# Patient Record
Sex: Male | Born: 1945 | Race: White | Hispanic: No | Marital: Married
Health system: Midwestern US, Academic
[De-identification: ages and names within clinical notes are randomized; demographics above are authoritative.]

---

## 2016-07-17 MED ORDER — NITROGLYCERIN 0.4 MG SL SUBL
0.4 mg | ORAL_TABLET | SUBLINGUAL | 1 refills | 9.00000 days | Status: AC | PRN
Start: 2016-07-17 — End: ?

## 2016-07-17 MED ORDER — METOPROLOL SUCCINATE 25 MG PO TB24
25 mg | ORAL_TABLET | Freq: Every day | ORAL | 11 refills | 90.00000 days | Status: DC
Start: 2016-07-17 — End: 2017-04-15

## 2016-11-15 ENCOUNTER — Encounter: Admit: 2016-11-15 | Discharge: 2016-11-15 | Payer: MEDICARE

## 2016-11-19 ENCOUNTER — Ambulatory Visit: Admit: 2016-11-19 | Discharge: 2016-11-19 | Payer: MEDICARE

## 2016-11-19 DIAGNOSIS — Z950 Presence of cardiac pacemaker: Secondary | ICD-10-CM

## 2016-11-19 DIAGNOSIS — I495 Sick sinus syndrome: Principal | ICD-10-CM

## 2016-11-21 ENCOUNTER — Encounter: Admit: 2016-11-21 | Discharge: 2016-11-21 | Payer: MEDICARE

## 2017-02-20 ENCOUNTER — Ambulatory Visit: Admit: 2017-02-20 | Discharge: 2017-02-21 | Payer: MEDICARE

## 2017-02-21 DIAGNOSIS — Z950 Presence of cardiac pacemaker: ICD-10-CM

## 2017-02-21 DIAGNOSIS — I495 Sick sinus syndrome: Principal | ICD-10-CM

## 2017-03-12 ENCOUNTER — Encounter: Admit: 2017-03-12 | Discharge: 2017-03-12 | Payer: MEDICARE

## 2017-04-15 ENCOUNTER — Encounter: Admit: 2017-04-15 | Discharge: 2017-04-15 | Payer: MEDICARE

## 2017-04-15 MED ORDER — METOPROLOL SUCCINATE 25 MG PO TB24
25 mg | ORAL_TABLET | Freq: Every day | ORAL | 3 refills | 90.00000 days | Status: AC
Start: 2017-04-15 — End: 2018-04-01

## 2017-05-24 ENCOUNTER — Ambulatory Visit: Admit: 2017-05-24 | Discharge: 2017-05-25 | Payer: MEDICARE

## 2017-05-24 ENCOUNTER — Encounter: Admit: 2017-05-24 | Discharge: 2017-05-24 | Payer: MEDICARE

## 2017-05-25 DIAGNOSIS — I495 Sick sinus syndrome: Principal | ICD-10-CM

## 2017-05-25 DIAGNOSIS — Z950 Presence of cardiac pacemaker: ICD-10-CM

## 2017-06-27 ENCOUNTER — Encounter: Admit: 2017-06-27 | Discharge: 2017-06-27 | Payer: MEDICARE

## 2017-06-27 DIAGNOSIS — E7800 Pure hypercholesterolemia, unspecified: ICD-10-CM

## 2017-06-27 DIAGNOSIS — I10 Essential (primary) hypertension: Principal | ICD-10-CM

## 2017-06-27 DIAGNOSIS — I2510 Atherosclerotic heart disease of native coronary artery without angina pectoris: ICD-10-CM

## 2017-06-27 DIAGNOSIS — I495 Sick sinus syndrome: ICD-10-CM

## 2017-06-27 DIAGNOSIS — I4891 Unspecified atrial fibrillation: ICD-10-CM

## 2017-06-27 DIAGNOSIS — G4730 Sleep apnea, unspecified: ICD-10-CM

## 2017-08-23 ENCOUNTER — Ambulatory Visit: Admit: 2017-08-23 | Discharge: 2017-08-24 | Payer: MEDICARE

## 2017-08-23 ENCOUNTER — Encounter: Admit: 2017-08-23 | Discharge: 2017-08-23 | Payer: MEDICARE

## 2017-08-24 DIAGNOSIS — Z950 Presence of cardiac pacemaker: Principal | ICD-10-CM

## 2017-10-28 ENCOUNTER — Encounter: Admit: 2017-10-28 | Discharge: 2017-10-28 | Payer: MEDICARE

## 2017-10-28 ENCOUNTER — Ambulatory Visit: Admit: 2017-10-28 | Discharge: 2017-10-28 | Payer: MEDICARE

## 2017-10-28 DIAGNOSIS — I440 Atrioventricular block, first degree: ICD-10-CM

## 2017-10-28 DIAGNOSIS — I2510 Atherosclerotic heart disease of native coronary artery without angina pectoris: ICD-10-CM

## 2017-10-28 DIAGNOSIS — I495 Sick sinus syndrome: Principal | ICD-10-CM

## 2017-10-28 DIAGNOSIS — Z950 Presence of cardiac pacemaker: ICD-10-CM

## 2017-10-28 DIAGNOSIS — Z959 Presence of cardiac and vascular implant and graft, unspecified: ICD-10-CM

## 2017-10-28 DIAGNOSIS — I480 Paroxysmal atrial fibrillation: ICD-10-CM

## 2017-10-28 DIAGNOSIS — G4730 Sleep apnea, unspecified: ICD-10-CM

## 2017-10-28 DIAGNOSIS — I4891 Unspecified atrial fibrillation: ICD-10-CM

## 2017-10-28 DIAGNOSIS — I10 Essential (primary) hypertension: Principal | ICD-10-CM

## 2017-10-28 DIAGNOSIS — E7800 Pure hypercholesterolemia, unspecified: ICD-10-CM

## 2017-11-06 ENCOUNTER — Encounter: Admit: 2017-11-06 | Discharge: 2017-11-06 | Payer: MEDICARE

## 2017-11-12 ENCOUNTER — Ambulatory Visit: Admit: 2017-11-12 | Discharge: 2017-11-12 | Payer: MEDICARE

## 2017-11-12 DIAGNOSIS — I2510 Atherosclerotic heart disease of native coronary artery without angina pectoris: Principal | ICD-10-CM

## 2017-11-12 MED ORDER — ALBUTEROL SULFATE 90 MCG/ACTUATION IN HFAA
2 | RESPIRATORY_TRACT | 0 refills | Status: DC | PRN
Start: 2017-11-12 — End: 2017-11-17

## 2017-11-12 MED ORDER — NITROGLYCERIN 0.4 MG SL SUBL
.4 mg | SUBLINGUAL | 0 refills | Status: AC | PRN
Start: 2017-11-12 — End: ?

## 2017-11-12 MED ORDER — SODIUM CHLORIDE 0.9 % IV SOLP
250 mL | INTRAVENOUS | 0 refills | Status: AC | PRN
Start: 2017-11-12 — End: ?

## 2017-11-12 MED ORDER — REGADENOSON 0.4 MG/5 ML IV SYRG
.4 mg | Freq: Once | INTRAVENOUS | 0 refills | Status: CP
Start: 2017-11-12 — End: ?
  Administered 2017-11-12: 15:00:00 0.4 mg via INTRAVENOUS

## 2017-11-12 MED ORDER — AMINOPHYLLINE 500 MG/20 ML IV SOLN
50 mg | INTRAVENOUS | 0 refills | Status: AC | PRN
Start: 2017-11-12 — End: ?

## 2017-11-14 ENCOUNTER — Encounter: Admit: 2017-11-14 | Discharge: 2017-11-14 | Payer: MEDICARE

## 2018-01-31 ENCOUNTER — Encounter: Admit: 2018-01-31 | Discharge: 2018-01-31 | Payer: MEDICARE

## 2018-02-07 ENCOUNTER — Ambulatory Visit: Admit: 2018-02-07 | Discharge: 2018-02-08 | Payer: MEDICARE

## 2018-02-07 ENCOUNTER — Encounter: Admit: 2018-02-07 | Discharge: 2018-02-07 | Payer: MEDICARE

## 2018-02-08 DIAGNOSIS — Z950 Presence of cardiac pacemaker: Principal | ICD-10-CM

## 2018-04-01 ENCOUNTER — Encounter: Admit: 2018-04-01 | Discharge: 2018-04-01 | Payer: MEDICARE

## 2018-04-01 MED ORDER — METOPROLOL SUCCINATE 25 MG PO TB24
25 mg | ORAL_TABLET | Freq: Every day | ORAL | 3 refills | 90.00000 days | Status: AC
Start: 2018-04-01 — End: 2018-05-29

## 2018-05-06 ENCOUNTER — Ambulatory Visit: Admit: 2018-05-06 | Discharge: 2018-05-07 | Payer: MEDICARE

## 2018-05-06 ENCOUNTER — Encounter: Admit: 2018-05-06 | Discharge: 2018-05-06 | Payer: MEDICARE

## 2018-05-06 DIAGNOSIS — E8770 Fluid overload, unspecified: Principal | ICD-10-CM

## 2018-05-06 DIAGNOSIS — Z950 Presence of cardiac pacemaker: ICD-10-CM

## 2018-05-08 ENCOUNTER — Encounter: Admit: 2018-05-08 | Discharge: 2018-05-08 | Payer: MEDICARE

## 2018-05-09 ENCOUNTER — Ambulatory Visit: Admit: 2018-05-09 | Discharge: 2018-05-09 | Payer: MEDICARE

## 2018-05-09 DIAGNOSIS — I495 Sick sinus syndrome: Principal | ICD-10-CM

## 2018-05-09 DIAGNOSIS — Z950 Presence of cardiac pacemaker: ICD-10-CM

## 2018-05-09 LAB — BASIC METABOLIC PANEL
Lab: 106 — ABNORMAL LOW (ref 42.0–52.0)
Lab: 140 — ABNORMAL LOW (ref 4.70–6.10)
Lab: 16 — ABNORMAL HIGH (ref 0–14)
Lab: 190 — ABNORMAL HIGH (ref 70–105)
Lab: 2.3 — ABNORMAL HIGH (ref 0.72–1.25)
Lab: 23
Lab: 28 — ABNORMAL LOW (ref >59)
Lab: 4.7 — ABNORMAL LOW (ref 14.0–18.0)
Lab: 55 — ABNORMAL HIGH (ref 8.4–25.7)
Lab: 9.6

## 2018-05-09 LAB — CBC: Lab: 4 — ABNORMAL LOW (ref 4.8–10.8)

## 2018-05-12 ENCOUNTER — Encounter: Admit: 2018-05-12 | Discharge: 2018-05-12 | Payer: MEDICARE

## 2018-05-22 ENCOUNTER — Encounter: Admit: 2018-05-22 | Discharge: 2018-05-22 | Payer: MEDICARE

## 2018-05-29 ENCOUNTER — Encounter: Admit: 2018-05-29 | Discharge: 2018-05-29 | Payer: MEDICARE

## 2018-05-29 ENCOUNTER — Ambulatory Visit: Admit: 2018-05-29 | Discharge: 2018-05-30 | Payer: MEDICARE

## 2018-05-29 DIAGNOSIS — I2510 Atherosclerotic heart disease of native coronary artery without angina pectoris: ICD-10-CM

## 2018-05-29 DIAGNOSIS — I10 Essential (primary) hypertension: Principal | ICD-10-CM

## 2018-05-29 DIAGNOSIS — I495 Sick sinus syndrome: ICD-10-CM

## 2018-05-29 DIAGNOSIS — I4891 Unspecified atrial fibrillation: ICD-10-CM

## 2018-05-29 DIAGNOSIS — I5030 Unspecified diastolic (congestive) heart failure: ICD-10-CM

## 2018-05-29 DIAGNOSIS — E7800 Pure hypercholesterolemia, unspecified: ICD-10-CM

## 2018-05-29 DIAGNOSIS — G4730 Sleep apnea, unspecified: ICD-10-CM

## 2018-05-29 DIAGNOSIS — I480 Paroxysmal atrial fibrillation: Principal | ICD-10-CM

## 2018-05-29 MED ORDER — CARVEDILOL 12.5 MG PO TAB
12.5 mg | ORAL_TABLET | Freq: Two times a day (BID) | ORAL | 11 refills | 90.00000 days | Status: AC
Start: 2018-05-29 — End: 2018-06-25

## 2018-05-29 MED ORDER — FUROSEMIDE 40 MG PO TAB
80 mg | ORAL_TABLET | Freq: Two times a day (BID) | ORAL | 1 refills | 90.00000 days | Status: AC
Start: 2018-05-29 — End: 2018-06-25

## 2018-05-30 ENCOUNTER — Encounter: Admit: 2018-05-30 | Discharge: 2018-05-30 | Payer: MEDICARE

## 2018-06-02 ENCOUNTER — Encounter: Admit: 2018-06-02 | Discharge: 2018-06-02 | Payer: MEDICARE

## 2018-06-02 DIAGNOSIS — I10 Essential (primary) hypertension: ICD-10-CM

## 2018-06-02 DIAGNOSIS — I480 Paroxysmal atrial fibrillation: Principal | ICD-10-CM

## 2018-06-02 LAB — HEMOGLOBIN A1C: Lab: 8.2

## 2018-06-02 LAB — BASIC METABOLIC PANEL: Lab: 14 10*3/uL — ABNORMAL LOW (ref 1.8–7.0)

## 2018-06-02 LAB — MAGNESIUM: Lab: 2.2 FL — ABNORMAL LOW (ref 80–100)

## 2018-06-03 ENCOUNTER — Encounter: Admit: 2018-06-03 | Discharge: 2018-06-03 | Payer: MEDICARE

## 2018-06-03 NOTE — Telephone Encounter
-----   Message from Golda Acre, LPN sent at 9/62/9528  9:59 AM CDT -----  Regarding: RCP- seen sooner  VM from [facility] with PCP office # [phone] on triage line.  Said that he would like to move his appointment up sooner and could be seen at [place] or [place].

## 2018-06-23 ENCOUNTER — Encounter: Admit: 2018-06-23 | Discharge: 2018-06-23 | Payer: MEDICARE

## 2018-06-24 ENCOUNTER — Encounter: Admit: 2018-06-24 | Discharge: 2018-06-24 | Payer: MEDICARE

## 2018-06-25 ENCOUNTER — Encounter: Admit: 2018-06-25 | Discharge: 2018-06-25 | Payer: MEDICARE

## 2018-06-25 ENCOUNTER — Ambulatory Visit: Admit: 2018-06-25 | Discharge: 2018-06-26 | Payer: MEDICARE

## 2018-06-25 DIAGNOSIS — I10 Essential (primary) hypertension: Principal | ICD-10-CM

## 2018-06-25 DIAGNOSIS — I5030 Unspecified diastolic (congestive) heart failure: ICD-10-CM

## 2018-06-25 MED ORDER — CARVEDILOL 25 MG PO TAB
25 mg | ORAL_TABLET | Freq: Two times a day (BID) | ORAL | 3 refills | 90.00000 days | Status: DC
Start: 2018-06-25 — End: 2019-04-07

## 2018-06-25 MED ORDER — FUROSEMIDE 80 MG PO TAB
80 mg | ORAL_TABLET | Freq: Every morning | ORAL | 3 refills | 90.00000 days | Status: DC
Start: 2018-06-25 — End: 2018-08-28

## 2018-07-09 ENCOUNTER — Encounter: Admit: 2018-07-09 | Discharge: 2018-07-09 | Payer: MEDICARE

## 2018-08-05 ENCOUNTER — Encounter: Admit: 2018-08-05 | Discharge: 2018-08-05 | Payer: MEDICARE

## 2018-08-05 DIAGNOSIS — I480 Paroxysmal atrial fibrillation: ICD-10-CM

## 2018-08-05 DIAGNOSIS — I10 Essential (primary) hypertension: Principal | ICD-10-CM

## 2018-08-05 DIAGNOSIS — E782 Mixed hyperlipidemia: ICD-10-CM

## 2018-08-05 NOTE — Telephone Encounter
-----   Message from Michiel Cowboy, MD sent at 06/25/2018  3:55 PM CDT -----  1 mo ESV IF CAN GET ZOOM, though they were told they needed a credit card for zoom and clearly need zoom help

## 2018-08-06 ENCOUNTER — Encounter: Admit: 2018-08-06 | Discharge: 2018-08-06 | Payer: MEDICARE

## 2018-08-06 DIAGNOSIS — I480 Paroxysmal atrial fibrillation: ICD-10-CM

## 2018-08-06 DIAGNOSIS — I10 Essential (primary) hypertension: Principal | ICD-10-CM

## 2018-08-06 DIAGNOSIS — E782 Mixed hyperlipidemia: ICD-10-CM

## 2018-08-06 LAB — BASIC METABOLIC PANEL
Lab: 145
Lab: 2.3 — ABNORMAL HIGH
Lab: 35
Lab: 6.4 — ABNORMAL HIGH
Lab: 9.3
Lab: 114 — ABNORMAL HIGH
Lab: 121 — ABNORMAL HIGH
Lab: 16 — ABNORMAL LOW
Lab: 2.6
Lab: 71 ng/dL — ABNORMAL HIGH (ref 0.6–1.6)

## 2018-08-06 LAB — BNP (B-TYPE NATRIURETIC PEPTI): Lab: 35

## 2018-08-06 LAB — MAGNESIUM: Lab: 2.6

## 2018-08-06 NOTE — Progress Notes
Received call from [facility] lab reporting critical K 6.4.  Will await hard copy to enter and route to JST for review and recommendations.  Flag sent to CVM STJ Nurse to watch for results.

## 2018-08-07 LAB — BASIC METABOLIC PANEL
Lab: 120 — ABNORMAL HIGH (ref 98–107)
Lab: 143
Lab: 17 — ABNORMAL LOW (ref 23–31)
Lab: 5.3 — ABNORMAL HIGH (ref 3.5–5.1)
Lab: 57 — ABNORMAL HIGH (ref 8.4–25.7)

## 2018-08-08 ENCOUNTER — Encounter: Admit: 2018-08-08 | Discharge: 2018-08-08 | Payer: MEDICARE

## 2018-08-08 LAB — BASIC METABOLIC PANEL
Lab: 1.8 — ABNORMAL HIGH (ref 0.72–1.25)
Lab: 116 — ABNORMAL HIGH (ref 98–107)
Lab: 142 — ABNORMAL HIGH (ref 70–105)
Lab: 145 — ABNORMAL LOW (ref 4.70–6.10)
Lab: 19 — ABNORMAL LOW (ref 23–31)
Lab: 38 — ABNORMAL LOW (ref >59)
Lab: 4 — ABNORMAL LOW (ref 14.0–18.0)
Lab: 45 — ABNORMAL HIGH (ref 8.4–25.7)
Lab: 8.1 — ABNORMAL LOW (ref 8.8–10.0)

## 2018-08-08 LAB — CBC: Lab: 5.6

## 2018-08-11 ENCOUNTER — Encounter: Admit: 2018-08-11 | Discharge: 2018-08-11 | Payer: MEDICARE

## 2018-08-11 LAB — BASIC METABOLIC PANEL
Lab: 11
Lab: 121 — ABNORMAL HIGH (ref 70–105)
Lab: 141
Lab: 2 — ABNORMAL HIGH (ref 0.72–1.25)
Lab: 34 — ABNORMAL LOW (ref >59)
Lab: 47 — ABNORMAL HIGH (ref 8.4–25.7)
Lab: 9.2

## 2018-08-11 LAB — BNP (B-TYPE NATRIURETIC PEPTI): Lab: 29 — ABNORMAL HIGH (ref 98–107)

## 2018-08-11 LAB — MAGNESIUM: Lab: 2.3

## 2018-08-11 NOTE — Telephone Encounter
Spoke with the patients wife. No letter was mailed to the patient per chart review. Apologized. She will send over later today when her husband arrives home. Verbalized understanding. DB.     [employer]   Received: Today   Message Contents   Golda Acre, LPN  P Mac Ep Remote      ???      VM from wife [name] [phone] on triage line.   Said that they did not get letter when to send transmission.   She would like call back.

## 2018-08-11 NOTE — Telephone Encounter
Patients [facility] remote transmission was received, pending staff review. Wife was notified and verbalized understanding. Theodosia Quay, LPN  P Mac Ep Remote      ???      VM from wife [phone] on triage line.   He is trying to send transmission and it will not go through.   It is needed for OV tomorrow

## 2018-08-12 ENCOUNTER — Encounter: Admit: 2018-08-12 | Discharge: 2018-08-12 | Payer: MEDICARE

## 2018-08-12 ENCOUNTER — Ambulatory Visit: Admit: 2018-08-12 | Discharge: 2018-08-12 | Payer: MEDICARE

## 2018-08-12 ENCOUNTER — Ambulatory Visit: Admit: 2018-08-12 | Discharge: 2018-08-13 | Payer: MEDICARE

## 2018-08-12 DIAGNOSIS — I10 Essential (primary) hypertension: Principal | ICD-10-CM

## 2018-08-12 DIAGNOSIS — I2510 Atherosclerotic heart disease of native coronary artery without angina pectoris: ICD-10-CM

## 2018-08-12 DIAGNOSIS — G4730 Sleep apnea, unspecified: ICD-10-CM

## 2018-08-12 DIAGNOSIS — I495 Sick sinus syndrome: ICD-10-CM

## 2018-08-12 DIAGNOSIS — I480 Paroxysmal atrial fibrillation: ICD-10-CM

## 2018-08-12 DIAGNOSIS — I4891 Unspecified atrial fibrillation: ICD-10-CM

## 2018-08-12 DIAGNOSIS — E7800 Pure hypercholesterolemia, unspecified: ICD-10-CM

## 2018-08-12 MED ORDER — CHLORTHALIDONE 25 MG PO TAB
25 mg | ORAL_TABLET | Freq: Every day | ORAL | 3 refills | Status: DC
Start: 2018-08-12 — End: 2019-05-21

## 2018-08-12 NOTE — Progress Notes
Obtained patient's verbal consent to treat them and their agreement to [facility] financial policy and NPP via this telehealth visit during the [facility]    Date of Service: 08/12/2018    Paul Benjamin is a 73 y.o. male.       HPI     This telephone visit was conducted via a call between the patient and physician/provider due to COVID-19. . We have found that certain health care needs can be provided without the need for a physical exam.. This service lets [place] provide the care patients' need.  If a prescription is necessary we can send it directly to their pharmacy.  If testing is necessary this can be arranged.    Paul Benjamin is a very pleasant 73 y.o. man followed in our [place] clinic for HFpEF, and he has seen Dr. Bradly Bienenstock for atrial fib and pacemaker dependence.  He has comorbid CKD3, HTN, venous insufficiency/lymphedema, and type 2 diabetes.    We sent him to [facility] 5/13 for assessment of hyperkalemia with potassium 6.4.  Notably he is on lisinopril and insulin, and he was asymptomatic at the time.  He was admitted for observation following treatment with iv calcium, lasix, and Kayexalate. 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    Today he reports stable dyspnea on exertion after half a block or so.   He currently denies exertional chest pain, irregular heartbeat/palpitations, dizzy spells, syncope/near syncope, edema, orthopnea, and paroxysmal nocturnal dyspnea.  Home blood pressure ranges 160/80, and pulse remains around 70-80.  Weight on home scale has been down a touch 260-265 pounds.           Vitals:    08/12/18 0947   Weight: 117.9 kg (260 lb)   Height: 1.803 m (5' 11)   PainSc: Zero     Body mass index is 36.26 kg/m???.     Past Medical History  Patient Active Problem List    Diagnosis Date Noted   ??? Preoperative clearance 06/26/2013 ??? 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 PLMD (periodic limb movement disorder) 09/29/2008   ??? 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 SSS (sick sinus syndrome) (HCC)      09/30/06 Dual Chamber PPM Initial ImplantMedtronic generator model #ADDR01 Medtronic atrial lead model #4076, Medtronic RV lead model #4076,      ??? A-fib (HCC) 09/30/2006     Brief episode of atrial fibrillation after the stenting.         a.  Recurrent bouts of atrial fibrillation but completely asymptomatic.  3/09:  Hospital admission for sotalol initiation and cardioversion  09/23/07 A fib ablation:  Pulmonary vein antral isolation, ligament of marshall ablation, posterior roof line, cavotricuspid isthmus ablation  6/09 event monitor:  No recurrent AF     ??? 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  Implanted device is a MDT Adapta [id number]     ??? CAD (coronary artery disease) 09/30/2006     01/21/04 Heart catheterization - Angioplasty and stenting of the anterior descending coronary artery on 01/21/04 with a Cypher drug-eluting stent used for the LAD. The right coronary and circumflex showed only minimal disease. The ejection fraction was 45-50%.   07/18/2004:  Thallium showing EF of 53% with minimal amount of ischemia involving the  inferolateral segment.  3/09 adenosine thallium stress:  This study suggests a low likelihood for ischemic/jeopardized myocardium. No definite fixed or reversible defects are evident. High risk scintigraphic features are absent. Although the calculated LV ejection fraction was reduced, by visual estimation, the EF would appear at the lower limits of normal. The left ventricle is within normal limits for size.     7/11 thallium:  SUMMARY/OPINION: This study is probably normal. There is a small defect involving the inferior wall. The majority of the defect is fixed while more distally there is a subtle degree of reversibility. Neurological: Negative.    Psychiatric/Behavioral: Negative.    Allergic/Immunologic: Negative.        Physical Exam  Not done, phone visit    Cardiovascular Studies  EKG performed at [facility] dated Aug 06, 2018 was a paced at a rate of 70 with a PR interval of 264 ms.  There is a first-degree AV block and left axis deviation with QRS -30.  Otherwise there are no concerning EKG changes.  His chest x-ray also performed at [facility] on the same day showed a mild left pleural effusion with adjacent atelectasis.  Multiple old left sided rib fractures were appreciated.    Remote transmission from his pacemaker was sent last night: No atrial or ventricular arrhythmias.  He is a paced V sensed at 70 bpm with a battery life estimated to be 7 years.  We will have next scheduled device check in August.    Problems Addressed Today  Encounter Diagnoses   Name Primary?   ??? Coronary artery disease involving native coronary artery of native heart without angina pectoris Yes   ??? Essential hypertension    ??? Paroxysmal atrial fibrillation (HCC)        Assessment and Plan     He has chronic heart failure with preserved ejection fraction, stable NYHA functional class III symptoms.  His blood pressure is slightly elevated since discontinuing lisinopril.  We will add chlorthalidone to his medication list and move lisinopril to his allergy list due to adverse effect of hyperkalemia with no other readily apparent cause.  He will follow with endocrinology and his primary care provider to see if he needs additional adjustments for this.  In the meantime we will continue his diuretic at current dose and have him follow in our clinic in the next 3 to 4 months for heart failure reassessment.  From an A. fib standpoint he remains in sinus rhythm and is no longer anticoagulated.  We will continue to have his device monitored by the device clinic and follow with Dr. Bradly Bienenstock periodically. Thank you for allowing me to participate in the care of this patient.  Please do not hesitate to contact me should you have any questions or concerns.    Baldo Ash, MD, PhD  Advanced Heart Failure and Transplant Cardiology  Pager [phone]       I spent 15 minutes with the patient today on the phone including approximately 10 minutes in counseling on his heart disease.  We reviewed the above treatment plan, went over risks/benefits/alternatives to the therapies, and all questions were answered to his satisfaction.      Current Medications (including today's revisions)  ??? allopurinol (ZYLOPRIM) 100 mg tablet Take 100 mg by mouth Twice Daily.   ??? amLODIPine (NORVASC) 10 mg tablet Take 10 mg by mouth daily.   ??? armodafinil(+) (NUVIGIL) 250 mg tab tablet Take 250 mg by mouth daily.   ??? ascorbic  acid (VITAMIN C) 1,000 mg tablet Take  by mouth.   ??? carvediloL (COREG) 25 mg tablet Take one tablet by mouth twice daily. Take with food.   ??? chlorthalidone (HYGROTON) 25 mg tablet Take one tablet by mouth daily.   ??? cholecalciferol (VITAMIN D-3) 1,000 units tablet Take 2,000 Units by mouth daily.   ??? furosemide (LASIX) 80 mg tablet Take one tablet by mouth every morning. OK TO TAKE EXTRA DOSE IN AFTERNOON IF WEIGHT ABOVE 272 POUNDS  Indications: visible water retention   ??? glimepiride (AMARYL) 4 mg tablet Take 4 mg by mouth every morning.   ??? insulin aspart U-100 (NOVOLOG FLEXPEN) 100 unit/mL (3 mL) injection PEN Inject 8 Units under the skin daily. As needed   ??? LEVEMIR FLEXTOUCH U-100 INSULN 100 unit/mL (3 mL) injection pen Inject 30 Units under the skin twice daily.   ??? levothyroxine (SYNTHROID) 75 mcg tablet Take 75 mcg by mouth daily.   ??? magnesium oxide (MAG-OX) 400 mg tablet Take 400 mg by mouth Twice Daily.   ??? nitroglycerin (NITROSTAT) 0.4 mg tablet Place 1 tablet under tongue every 5 minutes as needed for Chest Pain.   ??? pramipexole (MIRAPEX) 1.5 mg PO Tab Take 1.5 mg by mouth at bedtime daily.

## 2018-08-13 DIAGNOSIS — I495 Sick sinus syndrome: Principal | ICD-10-CM

## 2018-08-13 DIAGNOSIS — Z950 Presence of cardiac pacemaker: Secondary | ICD-10-CM

## 2018-08-15 ENCOUNTER — Encounter: Admit: 2018-08-15 | Discharge: 2018-08-15 | Payer: MEDICARE

## 2018-08-15 NOTE — Telephone Encounter
-----   Message from Gordy Savers sent at 08/12/2018 10:31 AM CDT -----  Regarding: RE: Remote sent  We received a transmission yesterday. Normal device function. No events.   ----- Message -----  From: Weston Brass  Sent: 08/12/2018  10:24 AM CDT  To: Phineas Real Remote  Subject: Remote sent                                      Patient had recent high k was sent to hospital. He is worried about arrhythmia sent remote.  Please advise if any issues     Thanks  Brett Canales

## 2018-08-25 ENCOUNTER — Encounter: Admit: 2018-08-25 | Discharge: 2018-08-25

## 2018-08-26 ENCOUNTER — Encounter: Admit: 2018-08-26 | Discharge: 2018-08-26

## 2018-08-26 DIAGNOSIS — I10 Essential (primary) hypertension: Secondary | ICD-10-CM

## 2018-08-26 DIAGNOSIS — I480 Paroxysmal atrial fibrillation: Secondary | ICD-10-CM

## 2018-08-26 DIAGNOSIS — I2510 Atherosclerotic heart disease of native coronary artery without angina pectoris: Secondary | ICD-10-CM

## 2018-08-26 LAB — BASIC METABOLIC PANEL
Lab: 11
Lab: 117 — ABNORMAL HIGH (ref 70–105)
Lab: 143
Lab: 2.6 — ABNORMAL HIGH (ref 0.72–1.25)
Lab: 24 — ABNORMAL LOW (ref >59)
Lab: 8.9

## 2018-08-26 LAB — MAGNESIUM: Lab: 2.6 % — ABNORMAL LOW (ref 28–42)

## 2018-08-26 NOTE — Telephone Encounter
-----   Message from Jamison Neighbor, MD sent at 08/26/2018  3:37 PM CDT -----  Regarding: labs  Paul Benjamin, I will have the nurses up [facility] call.    ATC crew, can we get a weight and symptom check and make sure he can see me on one of my June days in ATC, like 6/25.  I know the K was off, but I do not recall the med changes made after the ER visit.  Please let me know then I can help decide how to manage the renal function.  Thanks  ----- Message -----  From: Mendel Ryder, RN  Sent: 08/26/2018   1:55 PM CDT  To: Jamison Neighbor, MD    Labs ordered at [place] on 08/12/18.  Creat 2.68 up from 2.01 two weeks ago,BUN 61,  Mag 2.6    Notes from AVS 08-12-18  Med changes today:  Stop lisinopril, start chlorthalidone 25 mg daily    Med List  Lasix 80 mg daily, extra dose if weight> 275  Mag 400mg  BID    Any changes?    Paul Benjamin

## 2018-08-26 NOTE — Telephone Encounter
Hold lasix x 2 days then take every other day called to wife

## 2018-08-26 NOTE — Telephone Encounter
Lab orders faxed to [facility].

## 2018-08-26 NOTE — Telephone Encounter
-----   Message from Jamison Neighbor, MD sent at 08/26/2018  3:37 PM CDT -----  Regarding: labs  Joy, I will have the nurses up [facility] call.    ATC crew, can we get a weight and symptom check and make sure he can see me on one of my June days in ATC, like 6/25.  I know the K was off, but I do not recall the med changes made after the ER visit.  Please let me know then I can help decide how to manage the renal function.  Thanks  ----- Message -----  From: Mendel Ryder, RN  Sent: 08/26/2018   1:55 PM CDT  To: Jamison Neighbor, MD    Labs ordered at [place] on 08/12/18.  Creat 2.68 up from 2.01 two weeks ago,BUN 61,  Mag 2.6    Notes from AVS 08-12-18  Med changes today:  Stop lisinopril, start chlorthalidone 25 mg daily    Med List  Lasix 80 mg daily, extra dose if weight> 275  Mag 400mg  BID    Any changes?    Joy

## 2018-08-26 NOTE — Telephone Encounter
-----   Message from Jamison Neighbor, MD sent at 08/26/2018  3:37 PM CDT -----  Regarding: labs  Joy, I will have the nurses up [facility] call.    ATC crew, can we get a weight and symptom check and make sure he can see me on one of my June days in ATC, like 6/25.  I know the K was off, but I do not recall the med changes made after the ER visit.  Please let me know then I can help decide how to manage the renal function.  Thanks  ----- Message -----  From: Mendel Ryder, RN  Sent: 08/26/2018   1:55 PM CDT  To: Jamison Neighbor, MD    Labs ordered at [place] on 08/12/18.  Creat 2.68 up from 2.01 two weeks ago,BUN 61,  Mag 2.6    Notes from AVS 08-12-18  Med changes today:  Stop lisinopril, start chlorthalidone 25 mg daily    Med List  Lasix 80 mg daily, extra dose if weight> 275  Mag 400mg  BID    Any changes?    Joy

## 2018-08-28 ENCOUNTER — Encounter: Admit: 2018-08-28 | Discharge: 2018-08-28

## 2018-08-28 ENCOUNTER — Ambulatory Visit: Admit: 2018-08-28 | Discharge: 2018-08-29

## 2018-08-28 DIAGNOSIS — G4730 Sleep apnea, unspecified: Secondary | ICD-10-CM

## 2018-08-28 DIAGNOSIS — E7800 Pure hypercholesterolemia, unspecified: Secondary | ICD-10-CM

## 2018-08-28 DIAGNOSIS — I480 Paroxysmal atrial fibrillation: Secondary | ICD-10-CM

## 2018-08-28 DIAGNOSIS — I10 Essential (primary) hypertension: Secondary | ICD-10-CM

## 2018-08-28 DIAGNOSIS — I2510 Atherosclerotic heart disease of native coronary artery without angina pectoris: Secondary | ICD-10-CM

## 2018-08-28 DIAGNOSIS — I495 Sick sinus syndrome: Secondary | ICD-10-CM

## 2018-08-28 DIAGNOSIS — I4891 Unspecified atrial fibrillation: Secondary | ICD-10-CM

## 2018-08-28 DIAGNOSIS — I5032 Chronic diastolic (congestive) heart failure: Secondary | ICD-10-CM

## 2018-08-28 MED ORDER — FUROSEMIDE 80 MG PO TAB
80 mg | ORAL_TABLET | ORAL | 3 refills | 90.00000 days | Status: AC
Start: 2018-08-28 — End: ?

## 2018-08-28 NOTE — Progress Notes
Date of Service: 08/28/2018    Paul Benjamin is a 73 y.o. male.       HPI       [name] is a very pleasant 73 y.o. man with a history of bicuspid aortic valve and chronic diastolic heart failure.  I saw him 1 month ago in [place], and we increased his Lasix to 80 mg twice daily for approximately 10 pounds volume overload.  He subsequently achieved a dry weight of approximately 267 pounds, and is now only taking the Lasix once a day as instructed.  He continues to have dyspnea exertion with just about every activity, but he is otherwise without cardiac complaints.     Today he reports stable dyspnea after walking 5 minutes on exertion and worsening edema.  He currently denies exertional chest pain, irregular heartbeat/palpitations, dizzy spells, syncope/near syncope, orthopnea, and paroxysmal nocturnal dyspnea.  Home blood pressure is not recorded.  Pulse is usually in the 60-70s and he recalls SBP usually 110-130.  Weight on home scale has been 256 with no clothes on; on our scale he is 20 pounds down since March. He has easy bruising but no bleeding.  He has a good urine ressponse to lasix but inconsistent, sometimes all at once, sometimes multiple trips to urinate.          Vitals:    08/28/18 1042 08/28/18 1051   BP: 118/68 122/70   BP Source: Arm, Right Upper Arm, Left Upper   Pulse: 73    SpO2: 97%    Weight: 119.7 kg (264 lb)    Height: 1.803 m (5' 11)    PainSc: Zero      Body mass index is 36.82 kg/m???.     Past Medical History  Patient Active Problem List    Diagnosis Date Noted   ??? Preoperative clearance 06/26/2013   ??? 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 PLMD (periodic limb movement disorder) 09/29/2008   ??? 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 SSS (sick sinus syndrome) (HCC)      09/30/06 Dual Chamber PPM Initial ImplantMedtronic generator model #ADDR01 Medtronic atrial lead model #4076, Medtronic RV lead model #4076,      ??? A-fib (HCC) 09/30/2006 Brief episode of atrial fibrillation after the stenting.         a.  Recurrent bouts of atrial fibrillation but completely asymptomatic.  3/09:  Hospital admission for sotalol initiation and cardioversion  09/23/07 A fib ablation:  Pulmonary vein antral isolation, ligament of marshall ablation, posterior roof line, cavotricuspid isthmus ablation  6/09 event monitor:  No recurrent AF     ??? 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  Implanted device is a MDT Adapta ADDR01     ??? CAD (coronary artery disease) 09/30/2006     01/21/04 Heart catheterization - Angioplasty and stenting of the anterior descending coronary artery on 01/21/04 with a Cypher drug-eluting stent used for the LAD. The right coronary and circumflex showed only minimal disease. The ejection fraction was 45-50%.   07/18/2004:  Thallium showing EF of 53% with minimal amount of ischemia involving the               inferolateral segment.  3/09 adenosine thallium stress:  This study suggests a low likelihood for ischemic/jeopardized myocardium. No definite fixed or reversible defects are evident. High risk scintigraphic features are absent. Although the calculated LV ejection fraction was reduced, by visual  estimation, the EF would appear at the lower limits of normal. The left ventricle is within normal limits for size.     7/11 thallium:  SUMMARY/OPINION: This study is probably normal. There is a small defect involving the inferior wall. The majority of the defect is fixed while more distally there is a subtle degree of reversibility. Overall I think this probably represents diaphragmatic attenuation though a very slight degree of ischemia cannot be completely excluded. The left ventricular systolic function is normal with a calculated ejection fraction of 61 percent. The end diastolic volume is normal at 100 mL. The pulmonary to myocardial count ratio is normal at 0.36. There is no transient ischemic dilatation. This study was compared to a myocardial perfusion study obtained in our office on May 26, 2007. The prior study demonstrated an EF of 43 percent though visually it was felt to be in more of the 50-55 percent range. The prior study also demonstrated a perfusion abnormality involving the inferior wall which had some mild reversibility but overall was felt to be due to diaphragmatic attenuation.   In comparing the two studies qualitatively, both demonstrated an inferior wall defect. The prior study had more reversibility involving the basal to mid section while the current study has some mild reversibility involving the distal section. Overall I think both studies probably represent diaphragmatic attenuation.     05/03/2015 - Stress Test:  ??? This study is probably normal with inferior attenuation with a small sized mild intensity reversible defect in the basal inferior wall consistent with??? diaphragmatic attenuation. Left ventricular systolic function is normal. There are no high risk prognostic indicators present.??? The ECG portion of the study is negative for ischemia.         ??? Hypertension 09/30/2006   ??? 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 Sleep apnea 09/30/2006         Review of Systems   Constitution: Negative.   HENT: Negative.    Eyes: Negative.    Cardiovascular: Positive for leg swelling.   Respiratory: Negative.    Endocrine: Negative.    Hematologic/Lymphatic: Negative.    Skin: Negative.    Musculoskeletal: Positive for back pain.   Gastrointestinal: Positive for constipation and diarrhea.   Genitourinary: Positive for frequency.   Neurological: Negative.    Psychiatric/Behavioral: Negative.    Allergic/Immunologic: Negative.        Physical Exam  Constitutional: He appears well-developed and well-nourished.   HENT:  Head: Normocephalic.   Mouth/Throat: Oropharynx is clear and moist.   Eyes: Conjunctivae are normal.   Neck: Normal range of motion. No JVD present. Normal carotid pulses. Cardiovascular: Normal rate, regular rhythm, normal heart sounds and intact distal pulses. Exam reveals no gallop and no friction rub.  No murmur heard.  Pulmonary/Chest: Effort normal and breath sounds normal. No respiratory distress. He has no wheezes. He has no rales. He exhibits no chest wall tenderness.   Abdominal: Soft. Bowel sounds are normal. He exhibits no distension. There is no tenderness.   Musculoskeletal: Normal range of motion and muscular tone. He exhibits 1+ edema with no tenderness.   Neurological: He is alert and oriented to person, place, and time. No focal deficits.  Skin: Skin is warm. No erythema.   Psychiatric: He has a normal mood and affect. Judgment, behavior, and thought content normal.       Cardiovascular Studies      Problems Addressed Today  Encounter Diagnoses   Name Primary?   ??? Essential hypertension Yes   ???  Paroxysmal atrial fibrillation (HCC)    ??? Coronary artery disease involving native coronary artery of native heart without angina pectoris    ??? Chronic heart failure with preserved ejection fraction (HFpEF) (HCC)        Assessment and Plan     Today he is warm, normotensive, euvolemic to perhaps a very mildly volume overloaded with stable NYHA functional class symptoms.  I will use this opportunity to increase his carvedilol to 25 mg twice daily and continue Lasix 80 mg daily.  We will watch his blood pressure and rate trend along with his weights closely and recheck labs in a month.  He like to coordinate his blood work in conjunction with PCP Dr. Herschell Dimes later this month with labs for that appointment.  I am fine with that.  He has otherwise stable coronary disease and is appropriately on nitroglycerin and statin therapy with no angina.    Thank you for allowing me to participate in the care of this patient.  Please do not hesitate to contact me should you have any questions or concerns.    Baldo Ash, MD, PhD  Advanced Heart Failure and Transplant Cardiology Pager [phone]       I spent 25 minutes with the patient today including approximately 15 minutes in counseling on his heart disease.  We reviewed the above treatment plan, went over risks/benefits/alternatives to the therapies, and all questions were answered to his satisfaction.      Current Medications (including today's revisions)  ??? allopurinol (ZYLOPRIM) 100 mg tablet Take 100 mg by mouth Twice Daily.   ??? amLODIPine (NORVASC) 10 mg tablet Take 10 mg by mouth daily.   ??? armodafinil(+) (NUVIGIL) 250 mg tab tablet Take 250 mg by mouth daily.   ??? ascorbic acid (VITAMIN C) 1,000 mg tablet Take  by mouth.   ??? carvediloL (COREG) 25 mg tablet Take one tablet by mouth twice daily. Take with food.   ??? chlorthalidone (HYGROTON) 25 mg tablet Take one tablet by mouth daily.   ??? cholecalciferol (VITAMIN D-3) 1,000 units tablet Take 2,000 Units by mouth daily.   ??? furosemide (LASIX) 80 mg tablet Take one tablet by mouth every morning. OK TO TAKE EXTRA DOSE IN AFTERNOON IF WEIGHT ABOVE 272 POUNDS  Indications: visible water retention (Patient taking differently: Take 80 mg by mouth every 48 hours. OK TO TAKE EXTRA DOSE IN AFTERNOON IF WEIGHT ABOVE 272 POUNDS  Indications: visible water retention)   ??? glimepiride (AMARYL) 4 mg tablet Take 4 mg by mouth twice daily with meals.   ??? insulin aspart U-100 (NOVOLOG FLEXPEN) 100 unit/mL (3 mL) injection PEN Inject 8 Units under the skin daily. As needed   ??? LEVEMIR FLEXTOUCH U-100 INSULN 100 unit/mL (3 mL) injection pen Inject 30 Units under the skin twice daily.   ??? levothyroxine (SYNTHROID) 75 mcg tablet Take 75 mcg by mouth daily.   ??? magnesium oxide (MAG-OX) 400 mg tablet Take 400 mg by mouth Twice Daily.   ??? nitroglycerin (NITROSTAT) 0.4 mg tablet Place 1 tablet under tongue every 5 minutes as needed for Chest Pain.   ??? pramipexole (MIRAPEX) 1.5 mg PO Tab Take 1.5 mg by mouth at bedtime daily.   ??? simvastatin (ZOCOR) 20 mg tablet Take 20 mg by mouth at bedtime daily.

## 2018-09-22 ENCOUNTER — Encounter: Admit: 2018-09-22 | Discharge: 2018-09-22

## 2018-09-22 DIAGNOSIS — I2510 Atherosclerotic heart disease of native coronary artery without angina pectoris: Secondary | ICD-10-CM

## 2018-09-22 DIAGNOSIS — E7800 Pure hypercholesterolemia, unspecified: Secondary | ICD-10-CM

## 2018-09-22 DIAGNOSIS — G4730 Sleep apnea, unspecified: Secondary | ICD-10-CM

## 2018-09-22 DIAGNOSIS — I495 Sick sinus syndrome: Secondary | ICD-10-CM

## 2018-09-22 DIAGNOSIS — I4891 Unspecified atrial fibrillation: Secondary | ICD-10-CM

## 2018-09-22 DIAGNOSIS — I10 Essential (primary) hypertension: Secondary | ICD-10-CM

## 2018-10-18 ENCOUNTER — Encounter: Admit: 2018-10-18 | Discharge: 2018-10-19

## 2018-10-19 ENCOUNTER — Encounter: Admit: 2018-10-19 | Discharge: 2018-10-20

## 2018-10-19 ENCOUNTER — Encounter: Admit: 2018-10-19 | Discharge: 2018-10-19

## 2018-10-20 ENCOUNTER — Encounter: Admit: 2018-10-20 | Discharge: 2018-10-21

## 2018-10-21 ENCOUNTER — Encounter: Admit: 2018-10-21 | Discharge: 2018-10-21

## 2018-10-21 ENCOUNTER — Encounter: Admit: 2018-10-21 | Discharge: 2018-10-22

## 2018-10-22 ENCOUNTER — Encounter: Admit: 2018-10-22 | Discharge: 2018-10-22

## 2018-10-22 DIAGNOSIS — J9601 Acute respiratory failure with hypoxia: Secondary | ICD-10-CM

## 2018-10-22 DIAGNOSIS — A4189 Other specified sepsis: Principal | ICD-10-CM

## 2018-10-22 DIAGNOSIS — U071 COVID-19: Secondary | ICD-10-CM

## 2018-10-22 LAB — LDH-LACTATE DEHYDROGENASE: Lab: 346 U/L — ABNORMAL HIGH (ref 100–210)

## 2018-10-22 LAB — URINALYSIS DIPSTICK
Lab: NEGATIVE
Lab: NEGATIVE 10*3/uL (ref 0–0.20)
Lab: NEGATIVE 10*3/uL (ref 0–0.45)
Lab: NEGATIVE K/UL — ABNORMAL HIGH (ref 3–12)
Lab: NEGATIVE MMOL/L — ABNORMAL LOW (ref 21–30)
Lab: NEGATIVE U/L (ref 7–56)

## 2018-10-22 LAB — LACTIC ACID (BG - RAPID LACTATE): Lab: 1 MMOL/L — ABNORMAL LOW (ref 0.5–2.0)

## 2018-10-22 LAB — MAGNESIUM: Lab: 2.3 mg/dL — ABNORMAL HIGH (ref 1.6–2.6)

## 2018-10-22 LAB — PTT (APTT): Lab: 47 s — ABNORMAL HIGH (ref 24.0–36.5)

## 2018-10-22 LAB — POC GLUCOSE: Lab: 192 mg/dL — ABNORMAL HIGH (ref 70–100)

## 2018-10-22 LAB — CBC AND DIFF: Lab: 4.4 10*3/uL — ABNORMAL LOW (ref 4.5–11.0)

## 2018-10-22 LAB — BNP (B-TYPE NATRIURETIC PEPTI): Lab: 67 pg/mL — ABNORMAL HIGH (ref 0–100)

## 2018-10-22 LAB — URINALYSIS, MICROSCOPIC

## 2018-10-22 LAB — BLOOD GASES, ARTERIAL: Lab: 7.4 MMOL/L — ABNORMAL HIGH (ref 7.35–7.45)

## 2018-10-22 LAB — C REACTIVE PROTEIN (CRP): Lab: 27 mg/dL — ABNORMAL HIGH (ref <1.0)

## 2018-10-22 LAB — TSH WITH FREE T4 REFLEX: Lab: 0.5 uU/mL — ABNORMAL LOW (ref 0.35–5.00)

## 2018-10-22 LAB — D-DIMER: Lab: 103 ng{FEU}/mL — ABNORMAL HIGH (ref <500)

## 2018-10-22 LAB — FERRITIN: Lab: 511 ng/mL — ABNORMAL HIGH (ref 30–300)

## 2018-10-22 LAB — COMPREHENSIVE METABOLIC PANEL: Lab: 139 MMOL/L — ABNORMAL LOW (ref 137–147)

## 2018-10-22 LAB — PROTIME INR (PT): Lab: 1.2 U/L — ABNORMAL LOW (ref 0.8–1.2)

## 2018-10-22 LAB — PROCALCITONIN: Lab: 1.5 ng/mL — ABNORMAL HIGH (ref <0.11)

## 2018-10-22 MED ORDER — ENOXAPARIN 30 MG/0.3 ML SC SYRG
30 mg | Freq: Two times a day (BID) | SUBCUTANEOUS | 0 refills | Status: DC
Start: 2018-10-22 — End: 2018-11-01
  Administered 2018-10-23 – 2018-11-01 (×20): 30 mg via SUBCUTANEOUS

## 2018-10-22 MED ORDER — ALLOPURINOL 100 MG PO TAB
100 mg | Freq: Two times a day (BID) | ORAL | 0 refills | Status: DC
Start: 2018-10-22 — End: 2018-11-01
  Administered 2018-10-23 – 2018-11-01 (×19): 100 mg via ORAL

## 2018-10-22 MED ORDER — PRAMIPEXOLE 1 MG PO TAB
1.5 mg | Freq: Every evening | ORAL | 0 refills | Status: DC
Start: 2018-10-22 — End: 2018-10-22

## 2018-10-22 MED ORDER — INSULIN GLARGINE 100 UNIT/ML (3 ML) SC INJ PEN
20 [IU] | Freq: Every evening | SUBCUTANEOUS | 0 refills | Status: DC
Start: 2018-10-22 — End: 2018-10-23
  Administered 2018-10-23: 03:00:00 20 [IU] via SUBCUTANEOUS

## 2018-10-22 MED ORDER — INSULIN ASPART 100 UNIT/ML SC FLEXPEN
0-12 [IU] | Freq: Before meals | SUBCUTANEOUS | 0 refills | Status: DC
Start: 2018-10-22 — End: 2018-10-23
  Administered 2018-10-22: 2 [IU] via SUBCUTANEOUS

## 2018-10-22 MED ORDER — ACETAMINOPHEN 325 MG PO TAB
650 mg | ORAL | 0 refills | Status: DC | PRN
Start: 2018-10-22 — End: 2018-10-26
  Administered 2018-10-23 – 2018-10-24 (×2): 650 mg via ORAL

## 2018-10-22 MED ORDER — REMDESIVIR IVPB
100 mg | Freq: Every day | INTRAVENOUS | 0 refills | Status: CP
Start: 2018-10-22 — End: ?
  Administered 2018-10-23 – 2018-10-27 (×4): 100 mg via INTRAVENOUS

## 2018-10-22 MED ORDER — DEXAMETHASONE SODIUM PHOSPHATE 10 MG/ML IJ SOLN
6 mg | Freq: Every day | INTRAVENOUS | 0 refills | Status: CP
Start: 2018-10-22 — End: ?
  Administered 2018-10-22 – 2018-10-31 (×10): 6 mg via INTRAVENOUS

## 2018-10-22 MED ORDER — LEVOTHYROXINE 75 MCG PO TAB
75 ug | Freq: Every day | ORAL | 0 refills | Status: DC
Start: 2018-10-22 — End: 2018-11-01
  Administered 2018-10-23 – 2018-11-01 (×10): 75 ug via ORAL

## 2018-10-22 MED ORDER — CHLORTHALIDONE 25 MG PO TAB
25 mg | Freq: Every day | ORAL | 0 refills | Status: DC
Start: 2018-10-22 — End: 2018-11-01
  Administered 2018-10-23 – 2018-11-01 (×10): 25 mg via ORAL

## 2018-10-22 MED ORDER — REMDESIVIR IVPB
200 mg | Freq: Once | INTRAVENOUS | 0 refills | Status: CP
Start: 2018-10-22 — End: ?
  Administered 2018-10-23: 01:00:00 200 mg via INTRAVENOUS

## 2018-10-22 MED ORDER — HEPARIN, PORCINE (PF) 5,000 UNIT/0.5 ML IJ SYRG
5000 [IU] | SUBCUTANEOUS | 0 refills | Status: DC
Start: 2018-10-22 — End: 2018-10-23

## 2018-10-22 MED ORDER — INSULIN ASPART 100 UNIT/ML SC FLEXPEN
4 [IU] | Freq: Three times a day (TID) | SUBCUTANEOUS | 0 refills | Status: DC
Start: 2018-10-22 — End: 2018-10-23

## 2018-10-22 MED ORDER — FUROSEMIDE 80 MG PO TAB
80 mg | ORAL | 0 refills | Status: DC
Start: 2018-10-22 — End: 2018-11-01
  Administered 2018-10-23 – 2018-10-31 (×5): 80 mg via ORAL

## 2018-10-22 MED ORDER — AMLODIPINE 10 MG PO TAB
10 mg | Freq: Every day | ORAL | 0 refills | Status: DC
Start: 2018-10-22 — End: 2018-11-01
  Administered 2018-10-22 – 2018-11-01 (×11): 10 mg via ORAL

## 2018-10-22 MED ORDER — CARVEDILOL 25 MG PO TAB
25 mg | Freq: Two times a day (BID) | ORAL | 0 refills | Status: DC
Start: 2018-10-22 — End: 2018-11-01
  Administered 2018-10-23 – 2018-11-01 (×20): 25 mg via ORAL

## 2018-10-22 MED ORDER — SIMVASTATIN 20 MG PO TAB
20 mg | Freq: Every evening | ORAL | 0 refills | Status: DC
Start: 2018-10-22 — End: 2018-11-01
  Administered 2018-10-23 – 2018-11-01 (×10): 20 mg via ORAL

## 2018-10-22 MED ADMIN — ALBUTEROL SULFATE 90 MCG/ACTUATION IN HFAA [40196]: 2 | ORAL | @ 23:00:00 | Stop: 2018-10-22 | NDC 00173068220

## 2018-10-22 NOTE — Progress Notes
Patient arrived to room # [phone]) via bed accompanied by transport. Patient transferred to the bed with assistance. Bedside safety checks completed. Initial patient assessment completed. Refer to flowsheet for details.    Admission skin assessment completed with: Margarita Grizzle, RN    Pressure injury present on arrival?: No    1. Head/Face/Neck: No  2. Trunk/Back: No  3. Upper Extremities: No  4. Lower Extremities: No  5. Pelvic/Coccyx: No  6. Assessed for device associated injury? Yes  7. Malnutrition Screening Tool (Nursing Nutrition Assessment) Completed? Yes    See Doc Flowsheet for additional wound details.     INTERVENTIONS:   Encourage pt to turn, offload heels, early mobility

## 2018-10-22 NOTE — Progress Notes
Pt. Presented with AMS and hypoglycemia.  BS in 30's.  Pt. Has long hx. Of cellulitis on left leg, pt. Does still have 4 open areas that are wrapped, which is his baseline.  Wife said that pt. Should be checked for Covid, pt. Was exposed.  Pt. Swabbed on 10/19/18, pt. Positive for Covid.      Last night pt. Became febrile last night, increased 02 demands, required Bipap last night, is now on 9L HFNC, satting in the low 90's.  Pt. Doesn't seem SOB.  Pt. Gets SOB with exertion.    PCXR today shows improvement, persistent left basilar infiltrate  CT chest done 7/26:  heart mildly enlarged, post. Lower lobe ground glass consistent with PNE, more on right side    Meds:  zosyn, vanc,     Pt. Is not getting steroids    PMH:  pacemaker, stents, ablation - chronic afib, DM, HPL, cellulitis    Pt. Is alert and oriented.    Dr. Seymour Bars - [phone] ask for Dr. Mordecai Rasmussen

## 2018-10-23 ENCOUNTER — Encounter: Admit: 2018-10-23 | Discharge: 2018-10-23

## 2018-10-23 ENCOUNTER — Encounter: Admit: 2018-10-23 | Discharge: 2018-10-24

## 2018-10-23 LAB — MAGNESIUM: Lab: 2.3 mg/dL — ABNORMAL LOW (ref 1.6–2.6)

## 2018-10-23 LAB — POC GLUCOSE
Lab: 203 mg/dL — ABNORMAL HIGH (ref 70–100)
Lab: 261 mg/dL — ABNORMAL HIGH (ref 70–100)
Lab: 349 mg/dL — ABNORMAL HIGH (ref 70–100)

## 2018-10-23 LAB — CBC AND DIFF: Lab: 4.3 K/UL — ABNORMAL LOW (ref 4.5–11.0)

## 2018-10-23 LAB — STREPTOCOCCUS PNEUMO AG, URINE

## 2018-10-23 LAB — COMPREHENSIVE METABOLIC PANEL: Lab: 142 MMOL/L — ABNORMAL LOW (ref >60)

## 2018-10-23 LAB — D-DIMER: Lab: 876 ng{FEU}/mL — ABNORMAL HIGH (ref <500)

## 2018-10-23 LAB — LEGIONELLA ANTIGEN URINE,RAN

## 2018-10-23 LAB — PROCALCITONIN: Lab: 1.7 ng/mL — ABNORMAL HIGH (ref <0.11)

## 2018-10-23 LAB — MRSA PNEUMONIA SCREEN

## 2018-10-23 LAB — FERRITIN: Lab: 527 ng/mL — ABNORMAL HIGH (ref 30–300)

## 2018-10-23 LAB — PHOSPHORUS: Lab: 5.3 mg/dL — ABNORMAL HIGH (ref 2.0–4.5)

## 2018-10-23 LAB — LDH-LACTATE DEHYDROGENASE: Lab: 325 U/L — ABNORMAL HIGH (ref 100–210)

## 2018-10-23 LAB — C REACTIVE PROTEIN (CRP): Lab: 28 mg/dL — ABNORMAL HIGH (ref <1.0)

## 2018-10-23 MED ORDER — INSULIN REGULAR IN 0.9 % NACL 100 UNIT/100 ML (1 UNIT/ML) IV SOLN
1-32 [IU]/h | INTRAVENOUS | 0 refills | Status: DC
Start: 2018-10-23 — End: 2018-10-26
  Administered 2018-10-24 – 2018-10-25 (×2): 4 [IU]/h via INTRAVENOUS

## 2018-10-23 MED ORDER — AZITHROMYCIN 250 MG PO TAB
500 mg | Freq: Once | ORAL | 0 refills | Status: DC
Start: 2018-10-23 — End: 2018-10-23

## 2018-10-23 MED ORDER — INSULIN GLARGINE 100 UNIT/ML (3 ML) SC INJ PEN
30 [IU] | Freq: Two times a day (BID) | SUBCUTANEOUS | 0 refills | Status: DC
Start: 2018-10-23 — End: 2018-10-24

## 2018-10-23 MED ORDER — LEVOFLOXACIN IN D5W 750 MG/150 ML IV PGBK
750 mg | INTRAVENOUS | 0 refills | Status: DC
Start: 2018-10-23 — End: 2018-10-24
  Administered 2018-10-23: 21:00:00 750 mg via INTRAVENOUS

## 2018-10-23 MED ORDER — ACETAMINOPHEN 325 MG PO TAB
650 mg | Freq: Two times a day (BID) | ORAL | 0 refills | Status: AC
Start: 2018-10-23 — End: ?
  Administered 2018-10-24: 02:00:00 650 mg via ORAL

## 2018-10-23 MED ORDER — AZITHROMYCIN 250 MG PO TAB
250 mg | Freq: Every day | ORAL | 0 refills | Status: DC
Start: 2018-10-23 — End: 2018-10-23

## 2018-10-23 MED ORDER — INSULIN GLARGINE 100 UNIT/ML (3 ML) SC INJ PEN
20 [IU] | Freq: Two times a day (BID) | SUBCUTANEOUS | 0 refills | Status: DC
Start: 2018-10-23 — End: 2018-10-23
  Administered 2018-10-23: 13:00:00 20 [IU] via SUBCUTANEOUS

## 2018-10-23 MED ORDER — LEVOFLOXACIN IN D5W 750 MG/150 ML IV PGBK
750 mg | INTRAVENOUS | 0 refills | Status: DC
Start: 2018-10-23 — End: 2018-10-23

## 2018-10-23 MED ORDER — POTASSIUM CHLORIDE 20 MEQ PO TBTQ
40 meq | ORAL | 0 refills | Status: CP
Start: 2018-10-23 — End: ?
  Administered 2018-10-23 (×2): 40 meq via ORAL

## 2018-10-23 MED ORDER — INSULIN ASPART 100 UNIT/ML SC FLEXPEN
8 [IU] | Freq: Three times a day (TID) | SUBCUTANEOUS | 0 refills | Status: DC
Start: 2018-10-23 — End: 2018-10-24

## 2018-10-23 MED ORDER — INSULIN ASPART 100 UNIT/ML SC FLEXPEN
0-24 [IU] | Freq: Before meals | SUBCUTANEOUS | 0 refills | Status: DC
Start: 2018-10-23 — End: 2018-10-24

## 2018-10-23 NOTE — Consults
Infectious Diseases Initial Consult    Today's Date:  10/23/2018  Admission Date: 10/22/2018    Reason for this consultation: COVID 19 patient. Started on remdesevir 7/29. Please evaluate and makes recs for COVID mgmt  Consult type:Written opinion only    Assessment:     COVID-19 pneumonia with acute hypoxic respiratory failure  -Symptom onset ~7/22, progressive dyspnea, productive cough, fever/chills over the next few days  -7/25 - presented to [facility].  Hypoxic requiring 6L.  Admitted, started on vancomycin + Zosyn for suspected pneumonia +/- LE cellulitis  -7/25 BC x2 - unknown results  -7/26 - SARS-CoV-2 PCR positive   -7/26 CT chest ([place])??? posterior bilateral lower lobe groundglass and consolidative opacities consistent with pneumonia, mild right upper and middle and left lower lobe groundglass opacities also present.  -Transfer to [place] on 7/29 after developing worsening hypoxia requiring BIPAP the night prior.  No antiviral therapies at [facility], no steroids administered  -7/29 [place] - T 39.0, RR low 30s, started on 60L Comfort Flow with 100% FiO2 for progressive hypoxia.  WBC 4.4 (ALC 220), D-dimer 1038, ferritin 511, CRP 37.4, LDH 346.  Procal 1.54 (7/29) --> 1.79 (7/30)  -7/29 BC x2 - NGTD  -7/29 MRSA pneumonia PCR, Legionella and S pneumo urine Ags - negative    DM type II  -Insulin dependent, having hypoglycemia PTA    CKD stage III  -Estimated Creatinine Clearance: 34.9 mL/min (A) (based on SCr of 2.41 mg/dL (H)).    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     Chronic LE edema w/ venous stasis, reported recent episode cellulitis  -No evidence cellulitis on exam on initial ID evaluation on 7/30    Abx allergies/intolerance  -TMP/SMX: break out in hives per patient  -Cefuorixime - reported in chart, patient did not recall, will have to confirm.  Reported hives -Clindamycin - reported in chart, patient did not recall, will have to confirm.  Reported rash      Recommendations:     -Continue remdesivir, day 2/5  -Continue dexamethasone 6 mg daily, day 2/10  -Patient determined to not be a candidate for ATI-450 study by study team due to renal dysfunction.  Consented patient for COVID-19 convalescent plasma this afternoon - will administer 2 units, 6 hrs apart   -No evidence of cellulitis on exam, agree with discontinuation of vanc/Zosyn on transfer.  Reasonable to treat for 5 days for possible atypical bacterial pneumonia with elevated procal - levaquin started today (day 1)  -Monitor CBC w/diff, CMP, d-dimer, CRP, ferritin, and LDH daily    -Will continue to follow    Campbell Stall, DO  Infectious Diseases  Pager [phone], Voalte, or Cureatr    History of Present Illness    Paul Benjamin is a 73 y.o. male with PMH of DM type II, CAD, CKD III, chronic HFpEF who was transferred yesterday from [facility] with COVID-19 pneumonia complicated by acute hypoxic respiratory failure.  ID consulted for assistance with evaluation for therapies.    Patient presented to [facility] on 7/25.  He had recently been treated for cellulitis, stopping PO antibiotics (unclear which ones) about 3 weeks prior.  He began feeling more short of breath starting around 7/22.  Over the next few days, his dyspnea progressed along with subjective fevers and chills, worsening lower extremity edema, poor appetite, and episodes of hypoglycemia  along with increased cough productive of yellow sputum.  He was afebrile on arrival with SPO2 of 92% on room air intially, later that day required 6L O2 via NC.  CXR was obtained showing mild right basilar subsegmental atelectasis without evidence of pneumonia or CHF.  He was admitted, started on empiric IV vancomycin and Zosyn therapy.  His SARS-CoV-2 PCR from admission returned positive.  Over the next few days he remained on vancomycin and Zosyn and was treated supportively for COVID-19 (does not appear he was getting any steroids) with initially some improvement in oxygenation, down to 4 L O2.    On 7/28, patient became febrile with worsening hypoxia requiring BiPAP overnight.  He was titrated down to 9L NC in the a.m. [facility] was  contacted and accepted patient in transfer.   On arrival to [place] yesterday, patient was febrile to 39.0 Celsius.  Had normal HR and BP, however was tachypneic with RR up to low 30s.  He became progressively hypoxic on 15 L NC, was initiated on 60L Comfort Flow with 100% FiO2 aon 7/29 afternoon.  Labs on arrival with WBC 4.4 (ALC 220), Cr 2.74, normal LFTs.  D-dimer was 1038, ferritin 511, CRP 37.4, LDH 346.  These are all stable on 7/30 AM.  Procal on arrival was 1.54.  Blood cultures were obtained and are NGTD.  MRSA pneumonia PCR, Legionella and S pneumo urine Ags are all negative.    Patient seen earlier this afternoon.  He reports he is feeling better after being started on Comfort Flow after arrival to [place].  He reports before that, he was unable to get out a full sentence without having a coughing fit or becoming very short of breath.  He denies any headache, no sore throat.  Denies any chest pain currently.  Appetite is poor, having some nausea without vomiting.  Had a few loose stools at home, none so far today.  No myalgias or arthralgias.  Does endorse dysgeusia.      7/26 CT chest ([place])??? posterior bilateral lower lobe groundglass and consolidative opacities consistent with pneumonia, mild right upper and middle and left lower lobe groundglass opacities also present.  7/25 BC x2 Marge Duncans): no results available    Antimicrobial Start date End date   Vancomycin 7/25 7/29   Zosyn 7/25 7/29   Remdesivir 7/29 active   Levaquin 7/30 active                  Estimated Creatinine Clearance: 34.9 mL/min (A) (based on SCr of 2.41 mg/dL (H)).    Past Medical History     Medical History:   Diagnosis Date   ??? Atrial fibrillation (HCC) hx afib ablation   ??? coronary artery disease     s/p PCI LAD   ??? Hypercholesterolemia    ??? Hypertension    ??? Sleep apnea    ??? SSS (sick sinus syndrome) (HCC)     dual chamber PPM  / [employer]       Past Surgical History     Surgical History:   Procedure Laterality Date   ??? PACEMAKER PLACEMENT  09/30/06   ??? 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    Performed by Kathreen Cornfield, MD at [facility] EP LAB   ??? Removal Permanent Pacemaker N/A 01/17/2016    Performed by Kathreen Cornfield, MD at [facility] EP LAB       Social History   Marital status/area of residence: Married, lives in [place], [place]  Job/occupation:  Retired, formerly worked at a [employer], 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  No poultry or livestock exposure.  Recent ill contacts: Wife's nephew with known COVID-19   Drugs of abuse: None  Social History     Tobacco Use   ??? Smoking status: Former Smoker     Packs/day: 1.00     Last attempt to quit: 03/26/1966     Years since quitting: 52.6   ??? Smokeless tobacco: Never Used   Substance Use Topics   ??? Alcohol use: No       Family History     Family History   Problem Relation Age of Onset   ??? Asthma Father    ??? Hypertension Mother        Review of Systems   A comprehensive 14-point review of systems was negative with exception of:  Fever, chills, nausea, poor appetite, dysgeusia, dyspnea, productive cough    Allergies     Allergies   Allergen Reactions   ??? Lisinopril SEE COMMENTS     hyperkalemia   ??? Cefuroxime HIVES   ??? Clindamycin RASH   ??? Sulfa (Sulfonamide Antibiotics) HIVES   ??? Plavix [Clopidogrel] RASH   ??? Quinine      Allergy recorded in SMS: QUININE       Medications   Scheduled Meds:allopurinoL (ZYLOPRIM) tablet 100 mg, 100 mg, Oral, BID  amLODIPine (NORVASC) tablet 10 mg, 10 mg, Oral, QDAY  carvediloL (COREG) tablet 25 mg, 25 mg, Oral, BID  chlorthalidone (HYGROTON) tablet 25 mg, 25 mg, Oral, QDAY  dexamethasone (DECADRON) injection 6 mg, 6 mg, Intravenous, QDAY enoxaparin (LOVENOX) syringe 30 mg, 30 mg, Subcutaneous, BID  furosemide (LASIX) tablet 80 mg, 80 mg, Oral, Q48H*  insulin aspart U-100 (NOVOLOG FLEXPEN) injection PEN 0-12 Units, 0-12 Units, Subcutaneous, ACHS (22)  insulin aspart U-100 (NOVOLOG FLEXPEN) injection PEN 4 Units, 4 Units, Subcutaneous, TID w/ meals  insulin glargine (LANTUS SOLOSTAR) injection PEN 20 Units, 20 Units, Subcutaneous, BID  levothyroxine (SYNTHROID) tablet 75 mcg, 75 mcg, Oral, QDAY 30 min before breakfast  potassium chloride SR (K-DUR) tablet 40 mEq, 40 mEq, Oral, Q2H  remdesivir (EUA) 100 mg in sodium chloride 0.9% (NS) 250 mL IVPB, 100 mg, Intravenous, QDAY  simvastatin (ZOCOR) tablet 20 mg, 20 mg, Oral, QHS    Continuous Infusions:  PRN and Respiratory Meds:acetaminophen Q4H PRN      Physical Examination                          Vital Signs: Last                  Vital Signs: 24 Hour Range   BP: 119/56 (07/30 0800)  Temp: 36.7 ???C (98 ???F) (07/30 0800)  Pulse: 71 (07/30 0800)  Respirations: 15 PER MINUTE (07/30 0800)  SpO2: 92 % (07/30 0800)  SpO2 Pulse: 71 (07/30 0800)  Height: 180.3 cm (70.98) (07/29 1618) BP: (114-149)/(56-85)   Temp:  [36.6 ???C (97.9 ???F)-39 ???C (102.2 ???F)]   Pulse:  [69-83]   Respirations:  [15 PER MINUTE-33 PER MINUTE]   SpO2:  [81 %-98 %]      General appearance: alert, oriented, sitting up in chair, NAD  HENT: mucus membranes moist, no oral lesions, no thrush, normal posterior oropharynx.  Comfort Flow cannula in place  Eyes: EOM grossly intact, Conj nl  Neck: supple, no meningismus  Lungs: Diminished bilaterally, increased WOB, tachypnea  Heart: Regular  rhythm, reg rate, with no murmur, rub, gallop  Abdomen: soft, non-tender, non-distended, normoactive bowel sounds, no hepatosplenomegaly, no masses  Ext:  1+ lower extremity edema with wrinkling of skin, improving per patient  Skin: Chronic bilateral LE venous stasis skin changes.  Superficial ulcerations on L lower leg, no surrounding erythema, no evidence of cellulitis  Lymph: no cervical adenopathy    Lines: PIV    Lab Review   Hematology  Recent Labs     10/22/18  1640 10/23/18  0245   WBC 4.4* 4.3*   HGB 9.9* 9.1*   HCT 27.9* 26.0*   PLTCT 146* 147*   PTT 47.0*  --    INR 1.2  --      Chemistry  Recent Labs     10/22/18  1640 10/23/18  0245   NA 139 142   K 3.2* 3.1*   CL 106 107   CO2 20* 20*   BUN 66* 64*   CR 2.74* 2.41*   GFR 23* 26*   GLU 230* 236*   CA 7.8* 7.5*   PO4  --  5.3*   ALBUMIN 3.4* 3.0*   ALKPHOS 45 41   AST 23 21   ALT 15 13   TOTBILI 0.5 0.4       Microbiology, Radiology and other Diagnostics Review   Microbiology data reviewed.    Pertinent radiology images viewed.  Personally reviewed [place] CXR from 7/29 and [place] CT chest from 7/26     Zorita Pang, DO

## 2018-10-23 NOTE — Research Notes
Study Title: Expanded Access to [employer] for the Treatment of Patients with COVID-19   HSC/IRB Number: [id number]  PI: Wissam El Atrouni  SC: Atha Starks  ???  Did Patient or Surrogate sign the Main ICF: Patient  ???  Date Consent Form Signed: 10/23/2018    ???  Consent Version Date: [place] English version September 19, 2018; [place] English version Jul 29, 2018  ???  Name of Study Personnel Obtaining Consent: Campbell Stall, DO  ???  Indicate by which method Consent was Obtained:  Indicate by which method Consent was Obtained:Option 1 - Patient and Physician signed the consent form in isolation. A photo of the pertinent signature pages was emailed to the study team. The original signed consent form was left with the patient as his/her copy.   Name of Study Personnel Receiving Picture of ICF Pages: Wissam El Marlou Starks    Please Confirm that all of the following occurred:  ???  The consent process occurred in a private setting. Yes  ???  The participant/surrogate was given adequate time to review the consent and ask any questions they had. Yes  ???  All risks and benefits were addressed. Yes  ???  ICF was obtained prior to any procedures being performed that are directly related to research. Yes  ???  A copy of the signed ICF was given to the subject/surrogate. Yes   ???  ICF Process Notes:   Patient consented for himself, had wife and daughter on speaker phone who were also in agreement with plasma and asked appropriate questions    ???  ELIGIBILITY ASSESSMENT  INCLUSION CRITERIA MET YES / NO 1.   Age ?16 years Yes 2.   Laboratory confirmed diagnosis of infection with SARS-CoV2  Yes 3.   Admitted to an acute care facility for the treatment of COVID 19 complications Yes 4.   Severe or life threatenin COVID-19, or judged by the treating provider to be at high risk of progression to sever or life-threatening disease Yes 5.   Informed consent provided by the patient or healthcare proxy Yes   ??? I will order 2 units of convalescent plasma to be transfused 6 hours apart.      Zorita Pang, DO  Infectious Diseases  Pager [phone], Voalte, or Cureatr    ???
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
## 2018-10-23 NOTE — Progress Notes
Pulmonary / Critical Care Progress Note    Paul Benjamin  [id number] Date:  10/23/2018  Admission Date: 10/22/2018  LOS: 1 day    Principal Problem:    Pneumonia due to COVID-19 virus  Active Problems:    CAD (coronary artery disease)    Hypertension    Mixed dyslipidemia    Sleep apnea    PLMD (periodic limb movement disorder)    Chronic heart failure with preserved ejection fraction (HFpEF) [facility])    Brief Hospital Course:      Paul Benjamin is a 73 y/o male with PMH significant for HTN, HLD, chronic HFpEF, DM, hypothyroidism, and restless leg syndrome who presented to OSH with AMS and hypoglycemia; found to have COVID-19. Overnight 7/28-7/29, pt became febrile with increasing oxygen requirement (O2 sat low 90s on 9 L/NC). Transferred to [facility] for worsening acute hypoxic respiratory failure in the setting of COVID-19 pneumonia. Requiring comfort flow. ID following. On decadron and remdesivir; will evaluate for convalescent plasma.    Assessment/Plan:      NEURO  Restless Leg Syndrome  - hold PTA pramipexole d/t renal dysfunction    PULM  Acute Hypoxic Respiratory Failure  COVID-19 Pneumonia   - overnight 7/28-7/29, worsening hypoxia requiring 9 L HFNC --> transferred to [facility] for higher level of care  - CT chest at OSH 7/26: mildly enlarged heart; R > L lower lobe ground glass c/w pneumonia  - per outside records, pt was not started on steroids   - ABG on admit here: 7.46 / 30 / 94 / 22.9  - CXR on admit here: increasing patchy right > L mid and lower lung zone opacities concerning for progression of multifocal pneumonia  - currently on comfort flow -- 70% FiO2 w/ 60 LPM flow; O2 sat 92-96%  PLAN  - wean comfort flow/O2 as able  - decadron 6 mg daily per protocol  - ID w/u and tx as below  OSA  - PTA CPAP Q HS    CV  Chronic Diastolic Heart Failure  Bicuspid Aortic Valve  - follows with Dr. Pierre Bali in heart failure clinic   - dry weight appears to be 267 pounds - PTA meds: lasix 80 mg every other day, chlorthalidone 25 mg daily  - most recent echo 05/06/2018: LVEF 75% w/ normal diastolic function; mild concentric LVH, mild LA dilatation, mild RV and RA dilatation; bicuspid aortic valve sclerosis without stenosis; no pericardial effusion; PASP 32 mmHg  - BNP 67 (previously 25-35)  - net negative 730 mL since admit  PLAN  - continue PTA diuretics; goal net negative 1 L daily   HTN  HLD  CAD  - PTA meds: coreg 25 mg BID, amlodipine 10 mg daily, simvastatin 20 mg daily  - monitor shows paced rhythm, rate 70  - SBP 110s-120s  PLAN  - continue PTA meds  Hx Atrial Fibrillation s/p Ablation  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  - hospitalized 05/2007 for sotalol intiation and cardioveroisn  - s/p afib ablation 09/23/2007  - pt not on anticoagulation    GI  Diarrhea (improved)  - likely 2/2 COVID-19  - 2 formed BMs documented since admit  PLAN  - continue to monitor     RENAL  CKD  Hypokalemia  High Anion Gap Metabolic Acidosis  - baseline Cr appears to be 1.85-2.6 since February 2020  - Cr 2.74 on admit to [facility] --> 2.41 this AM  - 7/30: Na 142, K+ 3.1, CO2  20, AG 15, BUN 64, Cr 2.41, Mg 2.3  - lactate 1.0 (7/29)  - I/O: net - 1.5 L since admit w/ 1.8 L UOP  PLAN  - monitor I/O  - avoid nephrotoxins as able  - aggressively replace electrolytes    ENDO  Type 2 Diabetes Mellitus  - PTA regimen: levemir 30 units BID + aspart 8 units TID with meals  - hgbA1C 06/02/2018: 8.2  - POC glucose 190s-260s since admit  PLAN  - lantus 30 units BID + aspart 8 units TID w/ meals + high dose correction factor   Hypothyroidism  - TSH 0.58  PLAN  - continue PTA levothyroxine    ID  COVID-19 Pneumonia  - COVID-19 + on 7/26  - chest imaging as above  - pt received vancomycin and zosyn at OSH  - 7/30: WBC 4.3, absolute lymphocyte count 200; Tmax 39  - blood cx 7/29: no growth at 1 day  - UA 7/29: negative  - procalcitonin 1.54 (7/29) --> 1.79 (7/30)  - MRSA pneumonia PCR 7/29: negative - strep pneumo and legionella urine Ags 7/29: negative  - IL-6 7/29: pending  - inflammatory markers as of 7/30:   d-dimer 1038 --> 876   ferritin 511 --> 527   CRP 27.38 --> 28.37   LDH 346 --> 325  PLAN  - consulted ID  - initiated redemsivir 7/29; will continue x 5 day course (through 8/2)  - dexamethason 6 mg daily x 10 days (through 8/7)  - evaluate for convalescent plasma  - daily inflammatory markers  - high dose lovenox ppx (30 mg BID)  - check Xa level after next dose of lovenox (0100 on 7/31)    HEME  Normocytic Anemia  - 7/30: hgb 9.1, plt 147  - appears to be at baseline  PLAN  - continue to monitor    FEN  - cardiac/diabetic diet  - IVF: none  - review electrolytes and replace as needed    PPX:  Lines: PIV x 2  Drains/Tubes: None  Indwelling Urinary Catheter: No  DVT: SCDs; Lovenox  GI: Not indicated  PT/OT: Yes  Insulin: Yes  Reviewed Quality Checklist with RN: Yes    Code Status: Full Code  Disposition/Family: Continue ICU care.     99291 x 1 - pt critically ill with above diagnoses. I spent 65 minutes providing critical care services including:  ??? performing a physical examination  ??? serially reviewing laboratory, telemetry,  hemodynamic, oximetry, and respiratory data  ??? reviewing radiographic images  ??? reviewing medications  ??? managing fluids/electrolytes, antibiotics, ICU prophylaxis   ??? developing the overall plan of care.    Scheryl Darter, APRN  Pulm/Critical Care  Pager [phone] or VoalteMe  M3 (2nd call/night) Pager [phone]  10/23/2018 1402  __________________________________________________________________________________    Subjective:     Paul Benjamin is a 73 y.o. male who is awake and alert, resting in bed. States he feels okay this morning. Has productive cough, but has been swallowing phlegm, so unsure of color. States his appetite is poor, but has ordered breakfast.     ROS: Denies HA, chest pain/pressure, abdominal pain, N/V/D/C, or rash.    Objective:     Medications: Scheduled Meds:allopurinoL (ZYLOPRIM) tablet 100 mg, 100 mg, Oral, BID  amLODIPine (NORVASC) tablet 10 mg, 10 mg, Oral, QDAY  carvediloL (COREG) tablet 25 mg, 25 mg, Oral, BID  chlorthalidone (HYGROTON) tablet 25 mg, 25 mg, Oral, QDAY  dexamethasone (DECADRON) injection 6 mg,  6 mg, Intravenous, QDAY  enoxaparin (LOVENOX) syringe 30 mg, 30 mg, Subcutaneous, BID  furosemide (LASIX) tablet 80 mg, 80 mg, Oral, Q48H*  HEPARIN, PORCINE (PF) 5,000 UNIT/0.5 ML IJ SYRG (Cabinet Override), , , NOW  insulin aspart U-100 (NOVOLOG FLEXPEN) injection PEN 0-12 Units, 0-12 Units, Subcutaneous, ACHS (22)  insulin aspart U-100 (NOVOLOG FLEXPEN) injection PEN 4 Units, 4 Units, Subcutaneous, TID w/ meals  insulin glargine (LANTUS SOLOSTAR) injection PEN 20 Units, 20 Units, Subcutaneous, BID  levothyroxine (SYNTHROID) tablet 75 mcg, 75 mcg, Oral, QDAY 30 min before breakfast  potassium chloride SR (K-DUR) tablet 40 mEq, 40 mEq, Oral, Q2H  remdesivir (EUA) 100 mg in sodium chloride 0.9% (NS) 250 mL IVPB, 100 mg, Intravenous, QDAY  simvastatin (ZOCOR) tablet 20 mg, 20 mg, Oral, QHS    Continuous Infusions:  PRN and Respiratory Meds:acetaminophen Q4H PRN                       Vital Signs: Last Filed                  Vital Signs: 24 Hour Range   BP: 119/56 (07/30 0800)  Temp: 36.7 ???C (98 ???F) (07/30 0800)  Pulse: 71 (07/30 0800)  Respirations: 15 PER MINUTE (07/30 0800)  SpO2: 92 % (07/30 0800)  SpO2 Pulse: 71 (07/30 0800)  Height: 180.3 cm (70.98) (07/29 1618) BP: (114-149)/(56-85)   Temp:  [36.6 ???C (97.9 ???F)-39 ???C (102.2 ???F)]   Pulse:  [69-83]   Respirations:  [15 PER MINUTE-33 PER MINUTE]   SpO2:  [81 %-98 %]      Vitals:    10/22/18 1618 10/23/18 0600   Weight: 117.1 kg (258 lb 2.5 oz) 116.6 kg (257 lb 0.9 oz)           Intake/Output Summary:  (Last 24 hours)    Intake/Output Summary (Last 24 hours) at 10/23/2018 1610  Last data filed at 10/23/2018 0800  Gross per 24 hour   Intake 250 ml   Output 2190 ml   Net -1940 ml Physical Exam:      General: alert, cooperative, no distress, appears stated age  Head: normocephalic, without obvious abnormality, atraumatic  Eyes: conjunctivae/corneas clear, PERRL  Lungs: clear to auscultation bilaterally, diminished in bases  Heart: regular rate and rhythm, no murmur appreciated  Abdomen: soft, non-tender, bowel sounds active  Extremities: normal, atraumatic, no cyanosis; 1+ BLE edema  Peripheral pulses: 2+ and symmetric, all extremities  Cap Refill: 3 seconds  Skin: chronic lower extremity skin changes with wounds   Neurologic: grossly normal    Artificial airway:  None                                                                                         Ventilator/ Respiratory Therapy:  Comfort flow -- 70% FiO2 w/ 60 LPM flow    Vent weaning trial:  Not applicable      Laboratory:  LABS:  Recent Labs     10/22/18  1640 10/23/18  0245   NA 139 142   K 3.2* 3.1*   CL 106 107   CO2  20* 20*   GAP 13* 15*   BUN 66* 64*   CR 2.74* 2.41*   GLU 230* 236*   CA 7.8* 7.5*   ALBUMIN 3.4* 3.0*   MG 2.3 2.3   PO4  --  5.3*   TSH 0.58  --        Recent Labs     10/22/18  1640 10/23/18  0245   WBC 4.4* 4.3*   HGB 9.9* 9.1*   HCT 27.9* 26.0*   PLTCT 146* 147*   INR 1.2  --    PTT 47.0*  --    AST 23 21   ALT 15 13   ALKPHOS 45 41      Estimated Creatinine Clearance: 34.9 mL/min (A) (based on SCr of 2.41 mg/dL (H)).  Vitals:    10/22/18 1618 10/23/18 0600   Weight: 117.1 kg (258 lb 2.5 oz) 116.6 kg (257 lb 0.9 oz)      Recent Labs     10/22/18  1640   PHART 7.46*   PO2ART 94                 Radiology and Other Diagnostic Procedures Review:    Reviewed pertinent studies.

## 2018-10-23 NOTE — Progress Notes
MICU Critical Care Progress Note          Paul Benjamin  Admission Date: 10/22/2018  LOS: 1 day                     Assessment/Plan:   Principal Problem:    Pneumonia due to COVID-19 virus  Active Problems:    CAD (coronary artery disease)    Hypertension    Mixed dyslipidemia    Sleep apnea    PLMD (periodic limb movement disorder)    Chronic heart failure with preserved ejection fraction (HFpEF) (HCC)      Patient is critically ill secondary to the above diagnoses, but primarily concerned for the following:    1 acute hypoxemic respiratory failure secondary to COVID-19 pneumonia  2.  Severe sepsis secondary to COVID-19  3.  Chronic renal failure stage III  4.  Type 2 diabetes    Exam:  Vitals and I/O's reviewed  Neuro-alert and oriented  HEENT: No oral lesions  Neck: No lymphadenopathy  Chest-faint crackles in the bilateral bases  CV-regular rhythm and rate  Abd-obese but benign  Ext- no cyanosis or clubbing  Skin- no rashes, chronic woody edema changes of the bilateral lower extremities with ruptured bullous changes in the left leg.      I spent 45 minutes evaluating the patient including personally reviewing images, labs, progress and consult notes, discussing with nursing, and examining patient.  Plan of care includes the following:     ??? Continue on heated high flow nasal cannula for hypoxemia and will titrate oxygen as able.  ??? Continue therapy with Remdesivir and Decadron.  ??? We will check IL-6   ???ID consult to evaluate for convalescent plasma and alternative therapies.  ??? D-dimer does not meet criteria for full anticoagulation.    Discussed with pharmacy and will continue on twice daily prophylactic enoxaparin and follow factor Xa level after third dose.  ???No evidence of volume overload but will monitor closely with history of diastolic heart failure.  Continue home dose diuretics. ???Mildly elevated pro calcitonin.    And was slightly increased after recheck.  Had 5 days Zosyn and vancomycin.  Do not believe he has active cellulitis.  Could have atypical component to his pneumonic process will start Rocephin and azithromycin.        Theodoro Kalata, MD

## 2018-10-24 ENCOUNTER — Encounter: Admit: 2018-10-24 | Discharge: 2018-10-24

## 2018-10-24 DIAGNOSIS — I4891 Unspecified atrial fibrillation: Secondary | ICD-10-CM

## 2018-10-24 DIAGNOSIS — G4730 Sleep apnea, unspecified: Secondary | ICD-10-CM

## 2018-10-24 DIAGNOSIS — E7800 Pure hypercholesterolemia, unspecified: Secondary | ICD-10-CM

## 2018-10-24 DIAGNOSIS — A4189 Other specified sepsis: Principal | ICD-10-CM

## 2018-10-24 DIAGNOSIS — I10 Essential (primary) hypertension: Secondary | ICD-10-CM

## 2018-10-24 DIAGNOSIS — I2510 Atherosclerotic heart disease of native coronary artery without angina pectoris: Secondary | ICD-10-CM

## 2018-10-24 DIAGNOSIS — I495 Sick sinus syndrome: Secondary | ICD-10-CM

## 2018-10-24 LAB — POC GLUCOSE
Lab: 462 mg/dL — ABNORMAL HIGH (ref >60)
Lab: 496 mg/dL — ABNORMAL HIGH (ref 70–100)
Lab: 443 mg/dL — ABNORMAL HIGH (ref 70–100)
Lab: 422 mg/dL — ABNORMAL HIGH (ref 70–100)
Lab: 338 mg/dL — ABNORMAL HIGH (ref 70–100)
Lab: 258 mg/dL — ABNORMAL HIGH (ref 70–100)
Lab: 232 mg/dL — ABNORMAL HIGH (ref 70–100)
Lab: 229 mg/dL — ABNORMAL HIGH (ref 70–100)
Lab: 207 mg/dL — ABNORMAL HIGH (ref 70–100)
Lab: 184 mg/dL — ABNORMAL HIGH (ref 70–100)
Lab: 179 mg/dL — ABNORMAL HIGH (ref 70–100)
Lab: 290 mg/dL — ABNORMAL HIGH (ref 70–100)
Lab: 294 mg/dL — ABNORMAL HIGH (ref 70–100)
Lab: 246 mg/dL — ABNORMAL HIGH (ref 70–100)
Lab: 222 mg/dL — ABNORMAL HIGH (ref 70–100)
Lab: 284 mg/dL — ABNORMAL HIGH (ref 70–100)
Lab: 253 mg/dL — ABNORMAL HIGH (ref 70–100)
Lab: 228 mg/dL — ABNORMAL HIGH (ref 70–100)
Lab: 158 mg/dL — ABNORMAL HIGH (ref 70–100)

## 2018-10-24 LAB — GRAM STAIN

## 2018-10-24 LAB — INTERLEUKIN-6, SERUM: Lab: 137 — ABNORMAL HIGH

## 2018-10-24 LAB — CBC AND DIFF
Lab: 3.1 M/UL — ABNORMAL LOW (ref >60)
Lab: 4.5 10*3/uL — ABNORMAL HIGH (ref >60)

## 2018-10-24 LAB — D-DIMER: Lab: 101 ng{FEU}/mL — ABNORMAL HIGH (ref <500)

## 2018-10-24 LAB — COMPREHENSIVE METABOLIC PANEL
Lab: 139 MMOL/L — ABNORMAL LOW (ref 137–147)
Lab: 3.4 MMOL/L — ABNORMAL LOW (ref 3.5–5.1)
Lab: 302 mg/dL — ABNORMAL HIGH (ref 70–100)

## 2018-10-24 LAB — MAGNESIUM: Lab: 2.3 mg/dL — ABNORMAL LOW (ref 1.6–2.6)

## 2018-10-24 LAB — HEPARIN, LOW MOLECULAR WEIGHT: Lab: 0.2 [IU]/mL

## 2018-10-24 LAB — C REACTIVE PROTEIN (CRP): Lab: 20 mg/dL — ABNORMAL HIGH (ref <1.0)

## 2018-10-24 LAB — LDH-LACTATE DEHYDROGENASE: Lab: 354 U/L — ABNORMAL HIGH (ref >60)

## 2018-10-24 LAB — PHOSPHORUS: Lab: 2.5 mg/dL — ABNORMAL LOW (ref 2.0–4.5)

## 2018-10-24 LAB — FERRITIN: Lab: 639 ng/mL — ABNORMAL HIGH (ref 30–300)

## 2018-10-24 MED ORDER — DOXYCYCLINE HYCLATE 100 MG PO TAB
100 mg | Freq: Two times a day (BID) | ORAL | 0 refills | Status: CP
Start: 2018-10-24 — End: ?
  Administered 2018-10-25 – 2018-10-28 (×6): 100 mg via ORAL

## 2018-10-24 MED ORDER — POTASSIUM CHLORIDE 20 MEQ PO TBTQ
40 meq | Freq: Once | ORAL | 0 refills | Status: CP
Start: 2018-10-24 — End: ?
  Administered 2018-10-24: 13:00:00 40 meq via ORAL

## 2018-10-24 NOTE — Progress Notes
Pulmonary / Critical Care Progress Note    Paul Benjamin  [id number] Date:  10/24/2018  Admission Date: 10/22/2018  LOS: 2 days    Principal Problem:    Pneumonia due to COVID-19 virus  Active Problems:    CAD (coronary artery disease)    Hypertension    Mixed dyslipidemia    Sleep apnea    PLMD (periodic limb movement disorder)    Chronic heart failure with preserved ejection fraction (HFpEF) [facility])    Brief Hospital Course:      Paul Benjamin is a 73 y/o male with PMH significant for HTN, HLD, chronic HFpEF, DM, hypothyroidism, and restless leg syndrome who presented to OSH with AMS and hypoglycemia; found to have COVID-19. Overnight 7/28-7/29, pt became febrile with increasing oxygen requirement (O2 sat low 90s on 9 L/NC). Transferred to [facility] for worsening acute hypoxic respiratory failure in the setting of COVID-19 pneumonia. Continues on comfort flow. ID following. On decadron and remdesivir, s/p convalescent plasma 7/30.     Assessment/Plan:      NEURO  Restless Leg Syndrome  - Hold PTA pramipexole d/t renal dysfunction  ???  PULM  Acute Hypoxic Respiratory Failure  COVID-19 Pneumonia   - Overnight 7/28-7/29, worsening hypoxia requiring 9 L HFNC --> transferred to [facility] for higher level of care  - CT chest at OSH 7/26: mildly enlarged heart; R > L lower lobe ground glass c/w pneumonia  - Per outside records, pt was not started on steroids???  - ABG on admit here: 7.46 / 30 / 94 / 22.9  - CXR on admit here: increasing patchy right > L mid and lower lung zone opacities concerning for progression of multifocal pneumonia  - Cxr 7/31: No significant changes to bilateral opacities  - Currently on comfort flow: 60L and 60%  PLAN  - Wean comfort flow/O2 as able  - Decadron 6 mg daily per protocol  - Encourage prone position but has been refusing d/t discomfort  - ID w/u and tx as below  OSA  - PTA CPAP Q HS  ???  CV  Chronic Diastolic Heart Failure  Bicuspid Aortic Valve - Follows with Dr. Pierre Bali in heart failure clinic   - Dry weight appears to be 267 pounds  - Most recent echo 05/06/2018: LVEF 75% w/ normal diastolic function; mild concentric LVH, mild LA dilatation, mild RV and RA dilatation; bicuspid aortic valve sclerosis without stenosis; no pericardial effusion; PASP 32 mmHg  - BNP 67 (previously 25-35)  - Net - in 24hr  PLAN  - Continue PTA meds: lasix 80 mg every other day, chlorthalidone 25 mg daily   HTN/ HLD  CAD  - SBP 110s-130s; HR 70s (A-paced)  - Continue PTA meds: coreg 25 mg BID, amlodipine 10 mg daily, simvastatin???20 mg daily  Hx Atrial Fibrillation s/p Ablation  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  GI  Diarrhea (improved)  - Likely 2/2 COVID-19  - Last BM 7/30  PLAN  - Continue to monitor???  ???  RENAL  CKD  Hypokalemia  High Anion Gap Metabolic Acidosis  - Baseline Cr appears to be 1.85-2.6 since February 2020  - Lactate 1.0 (7/29)  - Cr 2.74 on admit to [facility] --> 2.10 this AM  - K 3.4, bicarb 19, AG 13  - I/O: net - 434mL/24hr; -1.9L since admit  PLAN  - Monitor I/O  - Avoid nephrotoxins as able  - Aggressively replace electrolytes  ???  ENDO  Type 2 Diabetes Mellitus  - PTA regimen: levemir???30???units BID + aspart 8 units TID with meals  - HgbA1C 06/02/2018: 8.2  - POC glucose 400s overnight  PLAN  - Continue insulin drip  - Evaluate 8/1 to transition back to lantus + novolog (meal time and sliding scale)  Hypothyroidism  - TSH 0.58  - Continue PTA levothyroxine  ???  ID  COVID-19 Pneumonia  - COVID-19 + on 7/26  - Chest imaging as above  - Pt received vancomycin and zosyn at OSH  - Today: Wbc 4.5; afebrile  - Blood cx 7/29: ngtd  - UA 7/29: negative  - Procalcitonin 1.54 (7/29) --> 1.79 (7/30)  - MRSA pneumonia PCR 7/29: negative  - Strep pneumo and legionella urine Ags 7/29: negative  - IL-6 7/29: pending - Inflammatory markers as of 7/31:               D-dimer 1038 --> 876 -> 1011               Ferritin 511 --> 527 -> 639               CRP 27.38 --> 28.37 -> 20.65               LDH 346 --> 325 -> 354  - S/p convalescent plasma 7/30  PLAN  - ID following  - Redemsivir 7/29; will continue x 5 day course (through 8/2)  - Dexamethason 6 mg daily x 10 days (through 8/7)  - Daily inflammatory markers  - D/c levaquin and switch to doxycyline x 3 days (starting 8/1)  - Anticoagulation as below  ???  HEME  Normocytic Anemia  - Hgb 9.2, plt 164  - Xa level 7/31: 0.20  PLAN  - Continue to monitor  - High dose lovenox ppx (30 mg BID) -> continue on this dose  - Continue managing lovenox dosing with pharmacy assistance     FEN  - IVF: None  - Review electrolytes and replace as needed  - Cardiac/DM Diet     PPX:  Lines: PIV x 2  Drains/Tubes: None  Indwelling Urinary Catheter: No  DVT: SCDs; Lovenox  GI: Not indicated  PT/OT: Yes  Insulin: Yes    Code Status: FULL  Disposition/Family:  ICU    99291 x 1 - pt critically ill with the above diagnoses. I spent 45 minutes providing critical care services including:  ??? performing a physical examination  ??? serially reviewing laboratory, telemetry,  hemodynamic, oximetry, and respiratory data  ??? reviewing radiographic images  ??? reviewing medications  ??? managing fluids/electrolytes, antibiotics, ICU prophylaxis, and mechanical ventilation   ??? developing the overall plan of care.    Pt seen & discussed w/ Dr. August Albino, APRN  Pulmonary/Critical Care  Pager [phone] or VoalteMe  10/24/2018  __________________________________________________________________________________    Subjective:     Paul Benjamin is a 73 y.o. male who is sitting up in bed eating breakfast in no acute distress. States his breathing is better than yesterday, but was not able to sleep well overnight d/t frequent blood sugar checks.    ROS: Denies HA, chest pain/pressure, SOB, cough, abdominal pain, N/V. Objective:     Medications:  Scheduled Meds:acetaminophen (TYLENOL) tablet 650 mg, 650 mg, Oral, BID  allopurinoL (ZYLOPRIM) tablet 100 mg, 100 mg, Oral, BID  amLODIPine (NORVASC) tablet 10 mg, 10 mg, Oral, QDAY  carvediloL (COREG) tablet 25 mg, 25 mg, Oral, BID  chlorthalidone (HYGROTON) tablet 25 mg, 25 mg, Oral, QDAY  dexamethasone (DECADRON) injection 6 mg, 6 mg, Intravenous, QDAY  enoxaparin (LOVENOX) syringe 30 mg, 30 mg, Subcutaneous, BID  furosemide (LASIX) tablet 80 mg, 80 mg, Oral, Q48H*  levoFLOXacin  (LEVAQUIN) 750 mg/D5W 150 mL IVPB, 750 mg, Intravenous, Q48H*  levothyroxine (SYNTHROID) tablet 75 mcg, 75 mcg, Oral, QDAY 30 min before breakfast  remdesivir (EUA) 100 mg in sodium chloride 0.9% (NS) 250 mL IVPB, 100 mg, Intravenous, QDAY  simvastatin (ZOCOR) tablet 20 mg, 20 mg, Oral, QHS    Continuous Infusions:  ??? insulin regular 100 units/NS 100 mL IV drip (premade) 2 Units/hr (10/24/18 0504)     PRN and Respiratory Meds:acetaminophen Q4H PRN                       Vital Signs: Last Filed                  Vital Signs: 24 Hour Range   BP: 131/76 (07/31 0612)  Temp: 37.1 ???C (98.7 ???F) (07/31 4540)  Pulse: 70 (07/31 0612)  Respirations: 21 PER MINUTE (07/31 0612)  SpO2: 89 % (07/31 0612)  SpO2 Pulse: 70 (07/31 0500) BP: (96-150)/(55-82)   Temp:  [36.4 ???C (97.5 ???F)-37.2 ???C (98.9 ???F)]   Pulse:  [70-72]   Respirations:  [12 PER MINUTE-24 PER MINUTE]   SpO2:  [89 %-99 %]      Vitals:    10/22/18 1618 10/23/18 0600   Weight: 117.1 kg (258 lb 2.5 oz) 116.6 kg (257 lb 0.9 oz)           Intake/Output Summary:  (Last 24 hours)    Intake/Output Summary (Last 24 hours) at 10/24/2018 9811  Last data filed at 10/24/2018 0600  Gross per 24 hour   Intake 1553.16 ml   Output 2000 ml   Net -446.84 ml         Physical Exam:      General: alert, cooperative, no distress, appears stated age  Head: normocephalic, without obvious abnormality, atraumatic  Eyes: conjunctivae/corneas clear, PERRL Lungs: clear to auscultation bilaterally, diminished in bases, non-labored on comfort flow 60L and 60%  Heart: regular rate and rhythm, no murmur appreciated  Abdomen: soft, non-tender, round. Bowel sounds present.  Extremities: normal, atraumatic, no cyanosis; 1+ BLE edema  Peripheral pulses: 2+ and symmetric, all extremities  Cap Refill: 3 seconds  Skin: chronic lower extremity skin changes with wounds   Neurologic: grossly normal    Laboratory:  LABS:  Recent Labs     10/22/18  1640 10/23/18  0245 10/24/18  0111   NA 139 142 139   K 3.2* 3.1* 3.4*   CL 106 107 107   CO2 20* 20* 19*   GAP 13* 15* 13*   BUN 66* 64* 69*   CR 2.74* 2.41* 2.10*   GLU 230* 236* 302*   CA 7.8* 7.5* 7.3*   ALBUMIN 3.4* 3.0* 3.0*   MG 2.3 2.3 2.3   PO4  --  5.3* 2.5   TSH 0.58  --   --        Recent Labs     10/22/18  1640 10/23/18  0245 10/24/18  0111   WBC 4.4* 4.3* 4.5   HGB 9.9* 9.1* 9.2*   HCT 27.9* 26.0* 26.3*   PLTCT 146* 147* 164  INR 1.2  --   --    PTT 47.0*  --   --    AST 23 21 19    ALT 15 13 14    ALKPHOS 45 41 44      Estimated Creatinine Clearance: 40.1 mL/min (A) (based on SCr of 2.1 mg/dL (H)).  Vitals:    10/22/18 1618 10/23/18 0600   Weight: 117.1 kg (258 lb 2.5 oz) 116.6 kg (257 lb 0.9 oz)      Recent Labs     10/22/18  1640   PHART 7.46*   PO2ART 94                 Radiology and Other Diagnostic Procedures Review:    Reviewed pertinent studies.

## 2018-10-24 NOTE — Progress Notes
MICU Critical Care Progress Note          Paul Benjamin  Admission Date: 10/22/2018  LOS: 2 days                     Assessment/Plan:   Principal Problem:    Pneumonia due to COVID-19 virus  Active Problems:    CAD (coronary artery disease)    Hypertension    Mixed dyslipidemia    Sleep apnea    PLMD (periodic limb movement disorder)    Chronic heart failure with preserved ejection fraction (HFpEF) (HCC)    Acute respiratory failure with hypoxia (HCC)    Hyperglycemia      Patient is critically ill secondary to the above diagnoses, but primarily concerned for the following:    1 acute hypoxemic respiratory failure secondary to COVID-19 pneumonia  2.  Severe sepsis secondary to COVID-19  3.  Chronic renal failure stage III  4.  Type 2 diabetes    Exam:  Vitals and I/O's reviewed  Neuro-alert and oriented  HEENT: No oral lesions  Neck: No lymphadenopathy  Chest-faint crackles in the bilateral bases  CV-regular rhythm and rate  Abd-obese but benign  Ext- no cyanosis or clubbing  Skin- no rashes, chronic woody edema changes of the bilateral lower extremities with ruptured bullous changes in the left leg.      I spent 40 minutes evaluating the patient including personally reviewing images, labs, progress and consult notes, discussing with nursing, and examining patient.  Plan of care includes the following:     ??? Continue on heated high flow nasal cannula for hypoxemia and will titrate oxygen as able.  ??? Continue therapy with Remdesivir and Decadron.  Received convalescent plasma.  ??? IL-6 mildly elevated  ???ID consulted and following  ??? D-dimer does not meet criteria for full anticoagulation.    Continue BID lovenox.  Factor Xa level not elevated.  ???No evidence of volume overload but will monitor closely with history of diastolic heart failure.  Continue home dose diuretics. ???Mildly elevated pro calcitonin.   complete 5 days atypical coverage with doxy.  ??? Mild diuresis daily.  Creatinine remained stable.        Theodoro Kalata, MD

## 2018-10-24 NOTE — Progress Notes
2100: PTs Blood sugar in the 400's. Called M1/M3, orders given to start insulin drip.    After both plasma infusions, pt had no complaints of a reaction.

## 2018-10-24 NOTE — Progress Notes
Infectious Diseases Progress Note    Today's Date:  10/24/2018  Admission Date: 10/22/2018    Reason for this consultation: COVID 19 patient. Started on remdesevir 7/29. Please evaluate and makes recs for COVID mgmt  Consult type:Written opinion only    Assessment:     COVID-19 pneumonia with acute hypoxic respiratory failure  -Symptom onset ~7/22, progressive dyspnea, productive cough, fever/chills over the next few days  -7/25 - presented to [facility].  Hypoxic requiring 6L.  Admitted, started on vancomycin + Zosyn for suspected pneumonia +/- LE cellulitis  -7/25 BC x2 - unknown results  -7/26 - SARS-CoV-2 PCR positive   -7/26 CT chest ([place])??? posterior bilateral lower lobe groundglass and consolidative opacities consistent with pneumonia, mild right upper and middle and left lower lobe groundglass opacities also present.  -Transfer to [place] on 7/29 after developing worsening hypoxia requiring BIPAP the night prior.  No antiviral therapies at [facility], no steroids administered  -7/29 [place] - T 39.0, RR low 30s, started on 60L Comfort Flow with 100% FiO2 for progressive hypoxia.  WBC 4.4 (ALC 220), D-dimer 1038, ferritin 511, CRP 37.4, LDH 346.  Procal 1.54 (7/29) --> 1.79 (7/30)  -7/29 BC x2 - NGTD  -7/29 MRSA pneumonia PCR, Legionella and S pneumo urine Ags - negative  -7/29 - started remdesivir + dexamethasone.  Consented for convalescent plasma, received 2 units on 7/30-7/31 overnight    DM type II  -Insulin dependent, having hypoglycemia PTA  -Hyperglycemia at [place] w/dexamethasone    CKD stage III  -Estimated Creatinine Clearance: 40.1 mL/min (A) (based on SCr of 2.1 mg/dL (H)).    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     Chronic LE edema w/ venous stasis, reported recent episode cellulitis  -No evidence cellulitis on exam on initial ID evaluation on 7/30    Abx allergies/intolerance -TMP/SMX: break out in hives per patient  -Cefuorixime - reported in chart, patient did not recall, will have to confirm.  Reported hives  -Clindamycin - reported in chart, patient did not recall, will have to confirm.  Reported rash      Recommendations:     -Continue remdesivir, day 3/5  -Continue dexamethasone 6 mg daily, day 3/10  -Received 2 units convalescent plasma overnight (7/30-7/31)    -Monitor CBC w/diff, CMP, d-dimer, CRP, ferritin, and LDH daily    -Will continue to follow    Campbell Stall, DO  Infectious Diseases  Pager [phone], Voalte, or Cureatr    Dr. Colon Flattery will begin rounding on Monday, Aug 3    If ID assistance is needed over the weekend, please contact the ID fellow on call (Pager [phone])      Interval Hx/ROS     Afebrile overnight, Tmax 37.2.  HR/BP stable.    Remains on Comfort Flow, 60L with 60% FiO2.  SpO2 in low 90s.    Received 2 units of convalescent plasma overnight    Seen this AM.  Feeling about the same as yesterday afternoon - overall better than when he arrived now that he is on Comfort Flow.  Still having intermittent cough.  No chest pain, no N/V, eating well currently.  No diarrhea, had a regular BM today.  No HA.    WBC 4.5  Cr 2.10 - improving  D-dimer, ferritin, LDH slt up.  CRP trending down.    7/31 CXR: Stable  bilateral patchy pulmonary opacities    Antimicrobial Start date End date   Vancomycin 7/25 7/29   Zosyn 7/25 7/29   Remdesivir 7/29 active   Levaquin 7/30 7/30                  Estimated Creatinine Clearance: 40.1 mL/min (A) (based on SCr of 2.1 mg/dL (H)).      Allergies     Allergies   Allergen Reactions   ??? Lisinopril SEE COMMENTS     hyperkalemia   ??? Cefuroxime HIVES   ??? Clindamycin RASH   ??? Sulfa (Sulfonamide Antibiotics) HIVES   ??? Plavix [Clopidogrel] RASH   ??? Quinine      Allergy recorded in SMS: QUININE       Medications   Scheduled Meds:acetaminophen (TYLENOL) tablet 650 mg, 650 mg, Oral, BID  allopurinoL (ZYLOPRIM) tablet 100 mg, 100 mg, Oral, BID amLODIPine (NORVASC) tablet 10 mg, 10 mg, Oral, QDAY  carvediloL (COREG) tablet 25 mg, 25 mg, Oral, BID  chlorthalidone (HYGROTON) tablet 25 mg, 25 mg, Oral, QDAY  dexamethasone (DECADRON) injection 6 mg, 6 mg, Intravenous, QDAY  enoxaparin (LOVENOX) syringe 30 mg, 30 mg, Subcutaneous, BID  furosemide (LASIX) tablet 80 mg, 80 mg, Oral, Q48H*  levoFLOXacin  (LEVAQUIN) 750 mg/D5W 150 mL IVPB, 750 mg, Intravenous, Q48H*  levothyroxine (SYNTHROID) tablet 75 mcg, 75 mcg, Oral, QDAY 30 min before breakfast  remdesivir (EUA) 100 mg in sodium chloride 0.9% (NS) 250 mL IVPB, 100 mg, Intravenous, QDAY  simvastatin (ZOCOR) tablet 20 mg, 20 mg, Oral, QHS    Continuous Infusions:  ??? insulin regular 100 units/NS 100 mL IV drip (premade) 2 Units/hr (10/24/18 0815)     PRN and Respiratory Meds:acetaminophen Q4H PRN      Physical Examination                          Vital Signs: Last                  Vital Signs: 24 Hour Range   BP: 141/76 (07/31 0700)  Temp: 37.1 ???C (98.7 ???F) (07/31 0730)  Pulse: 70 (07/31 0812)  Respirations: 17 PER MINUTE (07/31 0812)  SpO2: 91 % (07/31 0812)  SpO2 Pulse: 69 (07/31 0730) BP: (96-150)/(55-82)   Temp:  [36.4 ???C (97.5 ???F)-37.2 ???C (98.9 ???F)]   Pulse:  [70-72]   Respirations:  [12 PER MINUTE-24 PER MINUTE]   SpO2:  [89 %-99 %]      General appearance: alert, oriented, sitting up in chair, NAD  HENT: mucus membranes moist, no oral lesions, no thrush. Comfort Flow cannula in place  Eyes: EOM grossly intact, Conj nl  Lungs: Diminished bilaterally, increased WOB, tachypnea  Heart: Regular rhythm, reg rate, with no murmur, rub, gallop  Abdomen: soft, non-tender, non-distended  Ext:  1+ lower extremity edema with wrinkling of skin, stable from 7/30 exam  Skin: Chronic bilateral LE venous stasis skin changes.  Superficial ulcerations on L lower leg, no surrounding erythema, no evidence of cellulitis    Lines: PIV    Lab Review   Hematology  Recent Labs     10/22/18  1640 10/23/18  0245 10/24/18  0111 WBC 4.4* 4.3* 4.5   HGB 9.9* 9.1* 9.2*   HCT 27.9* 26.0* 26.3*   PLTCT 146* 147* 164   PTT 47.0*  --   --    INR 1.2  --   --      Chemistry  Recent Labs     10/22/18  1640 10/23/18  0245 10/24/18  0111   NA 139 142 139   K 3.2* 3.1* 3.4*   CL 106 107 107   CO2 20* 20* 19*   BUN 66* 64* 69*   CR 2.74* 2.41* 2.10*   GFR 23* 26* 31*   GLU 230* 236* 302*   CA 7.8* 7.5* 7.3*   PO4  --  5.3* 2.5   ALBUMIN 3.4* 3.0* 3.0*   ALKPHOS 45 41 44   AST 23 21 19    ALT 15 13 14    TOTBILI 0.5 0.4 0.3       Microbiology, Radiology and other Diagnostics Review   Microbiology data reviewed.    Pertinent radiology images viewed.  Personally reviewed [place] CXR from 7/29 and [place] CT chest from 7/26     Zorita Pang, DO

## 2018-10-24 NOTE — Case Management (ED)
Case Management Progress Note    NAME:Paul Benjamin                          MRN: [id number]              DOB:01-Jun-1945          AGE: 73 y.o.  ADMISSION DATE: 10/22/2018             DAYS ADMITTED: LOS: 2 days      Todays Date: 10/24/2018    Plan: Will continue to follow as condition evolves.    Interventions  ? Support  SW [employer] info to pt's family at [email or web address] with plan to follow up Monday to schedule a time to connect pt and family.     (Zoom Meeting ID: [phone], Password: [phone])     ? Info or Referral      ? Discharge Planning      ? Medication Needs                                                 ? Financial      ? Legal      ? Other        Disposition  ? Expected Discharge Date    Expected Discharge Date: 10/31/18  ? Transportation   Does the patient need discharge transport arranged?: No  Transportation Name, Phone and Availability #1: Wife will transport   ? Next Level of Care (Acute Psych discharges only)      ? Discharge Disposition                                          Durable Medical Equipment      No service has been selected for the patient.      [place] [facility]      No service has been selected for the patient.      [place]      No service has been selected for the patient.      [facility]      No service has been selected for the patient.        [address], LMSW  Phone: [phone]

## 2018-10-24 NOTE — Consults
PALLIATIVE CARE INPATIENT NOTE     Name: Paul Benjamin            MRN: [id number]                DOB: 14-Apr-1945          Age: 73 y.o.  Admission Date: 10/22/2018             LOS: 2 days    ASSESSMENT     Paul Benjamin is a 73 y.o. male with SOA, respiratory distress, and AMS transferred from [facility] 7/29  for further evaluation of hypoxic respiratory failure and + Covid 19 test results (7/26). He is requiring heated high flow oxygen (60 L/60% FiO2). His health is complicated by other co-morbidities. Palliative Care has been asked to assist with family communication.     Advance Care Planning: Pt states he has completed [facility] paperwork (has it at home).   Code Status: Full Code  Identified Health Care Decision Maker:    Gareth Eagle stated wife Taliesin Stano [phone]) is primary medical decision maker on his behalf if needed. Son and dtr are alternates. Will see if copy could be emailed to SW.     Prognosis (Estimated):  Based on current function, current medical issues, goals of care, and prognosis models, my estimated prognosis is Unknown.  Potential Disposition: Too soon to determine; goal is to return home.       PLAN     #Dyspnea  #Respiratory failure  #Covid 19 PNA    Discussion:   Met with patient/family to introduce role of palliative care: symptom management, assisting with medical decisions in complex situations, and assisting with resources for care outside the hospital.  Wife [name] and their dtr joined via Zoom at this time and they were able to see/speak w/pt. Pt has cell phone at bedside and is able to talk to family daily.      Family structure:  Immediate Family members & contact numbers:   Wife: Keraun Aaron  [phone] mobile; [phone] work  Secondary contact: Kairi Ocasio (dtr)  [phone] mobile; [phone]  Children:  5 children. 3 sons, 2 daughters. Most live locally, one out of state.   Grandchildren:  9 grandchildren  Siblings: one brother(deceased), one sister  Parents: deceased Family COVID-19 status: Pt reported immdiate family members in home were tested with negative results.      InterFamily communication:   Cell phone    Pt/wife have a computer in the home - no mic or camera  Dtr has used Zoom and is familiar. She will help pt's wife get set up if possible.       Medical communication:  Medical updates by phone and Zoom calls. Pt may have difficulty relaying accurate medical information per family.   They appreciate speaking with drs and nurses caring for pt.     Pt has been started on Dexamethasone, Remdesivir and consented for convalescent plasma.      Any other concerns:      None at this time.     Our team appreciates the opportunity to be involved with upstream communication for at-risk COVID patients.    RECOMMENDATIONS:  ? Palliative Care will continue to follow as condition evolves.       SUBJECTIVE     CC/Reason for Visit:  Covid 19 Pneumonia; Assist with family communication    History of Present Illness:   Paul Benjamin is a 73 y.o. male with PMHx  HTN, HLD, DM,  hypothyroidism, restless leg syndrome, SSS s/p PPM who was transferred from [facility] for worsening acute hypoxic respiratory failure in the setting of COVID 19 pneumonia.   ???  Initially presented to  [facility] hospital with dyspnea, AMS and hypoglycemia. He reported a productive cough with yellow sputum. Patient was exposed to COVID 19 pneumonia and had + test on 7/26. Pt became febrile with increasing oxygen needs requiring 9 L HFNC with saturations in the low 90's. He was started on Zosyn/Vancomycin and transferred to [facility] on 7/29. He reports dyspnea is stable, + fatigue, and new diarrhea. He feels a bit anxious today and did not sleep well overnight. He is up in chair this afternoon. Palliative Care has been asked to assist with family communication.   ??????  Per review of records,  patient was exposed to his wife's nephew who has COVID-19 confirmed.   ???  Medical History:   Diagnosis Date ??? Atrial fibrillation (HCC)     hx afib ablation   ??? coronary artery disease     s/p PCI LAD   ??? Hypercholesterolemia    ??? Hypertension    ??? Sleep apnea    ??? SSS (sick sinus syndrome) (HCC)     dual chamber PPM  / [employer]tronic     Surgical History:   Procedure Laterality Date   ??? PACEMAKER PLACEMENT  09/30/06   ??? 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    Performed by Kathreen Cornfield, MD at [facility] EP LAB   ??? Removal Permanent Pacemaker N/A 01/17/2016    Performed by Kathreen Cornfield, MD at [facility] EP [facility]     Social History     Tobacco Use   ??? Smoking status: Former Smoker     Packs/day: 1.00     Last attempt to quit: 03/26/1966     Years since quitting: 52.6   ??? Smokeless tobacco: Never Used   Substance Use Topics   ??? Alcohol use: No   ??? Drug use: No     Social History     Social History Narrative   ??? Not on file     Occupation: formerly worked in the [employer] for > 30 years  Marital status: married. Wife [name] [phone])  Children: 5 kids  Other significant loved ones: grandchildren (79), friends  Spiritual Screen: Deferred at this time     Life long resident of [place], [place].  Enjoys tinkering, having the grandchildren around.     Family History:   Family History   Problem Relation Age of Onset   ??? Asthma Father    ??? Cardiovascular Father    ??? Hypertension Mother      Family Status   Relation Name Status   ??? Father  Deceased   ??? Mother  Deceased        bypass   ??? Brother  Deceased at age 58        heart attack    ??? Sister  Alive   Father died of heart condition in in late 2's.   Mother died of heart conditions in her 57's.   Sister living - poor health  ROS:    A 14 point review of systems was negative except for: Constitutional: positive for fatigue and malaise  Ears, nose, mouth, throat, and face: positive for nasal congestion  Respiratory: positive for increased work of breathing  Integument/breast: positive for skin lesion(s)    [place] Symptom Assessment Scale (ESAS-r-CS)     10/24/2018 Symptom /  Score   Pain: 0   Shortness of Breath: 5   Tiredness / Fatigue: 8   Drowsiness / Sleepy: 0   Insomnia: 3   Nausea: 0   Lack of Appetite: 0   Constipation: 0   Depression: 0   Anxiety: 2   Wellbeing: 6   Completed by: Patient    0 = Best Possible  10 = Worst Possible    Opioid Risk Took Assessment:   No data recorded      OBJECTIVE     Blood pressure 125/69, pulse 70, temperature 36.6 ???C (97.8 ???F), height 180.3 cm (70.98), weight 116.6 kg (257 lb 0.9 oz), SpO2 95 %.  Physical Exam   Constitutional: He is oriented to person, place, and time and well-developed, well-nourished, and in no distress. He appears unhealthy. He has a sickly appearance.   HENT:   Head: Normocephalic.   Nose: Rhinorrhea present.   Eyes: Pupils are equal, round, and reactive to light. EOM are normal.   Cardiovascular: Normal rate and regular rhythm.   Pulmonary/Chest: He is in respiratory distress. He has decreased breath sounds in the right lower field and the left lower field. He has wheezes in the right upper field and the right middle field. He has rales in the right lower field.   Abdominal: Soft. Bowel sounds are normal.   Musculoskeletal:         General: Edema present.   Neurological: He is alert and oriented to person, place, and time.   Skin: Skin is warm and dry. There is erythema.   Hx cellulitis LE. Has darkened purplish red skin from below knees to ankles. Deep wrinkles present - pt reports prior swelling in legs. Dry gauze dressings in place over lower 1/2 LLE.    Psychiatric: Mood, memory, affect and judgment normal.       Current PPS%: Palliative Performance Scale (PPS): 60    Lab Results:  CBC   Lab Results   Component Value Date/Time    WBC 4.5 10/24/2018 01:11 AM    HGB 9.2 (L) 10/24/2018 01:11 AM    PLTCT 164 10/24/2018 01:11 AM     Lab Results   Component Value Date/Time    NEUT 92 (H) 10/24/2018 01:11 AM    ANC 4.14 10/24/2018 01:11 AM      Chemistries   Lab Results   Component Value Date/Time NA 139 10/24/2018 01:11 AM    K 3.4 (L) 10/24/2018 01:11 AM    BUN 69 (H) 10/24/2018 01:11 AM    CR 2.10 (H) 10/24/2018 01:11 AM    GLU 302 (H) 10/24/2018 01:11 AM    GLU 113 (H) 01/06/2016 03:46 PM     Lab Results   Component Value Date/Time    CA 7.3 (L) 10/24/2018 01:11 AM    PO4 2.5 10/24/2018 01:11 AM    ALBUMIN 3.0 (L) 10/24/2018 01:11 AM    TOTPROT 5.9 (L) 10/24/2018 01:11 AM    ALKPHOS 44 10/24/2018 01:11 AM    AST 19 10/24/2018 01:11 AM    ALT 14 10/24/2018 01:11 AM    TOTBILI 0.3 10/24/2018 01:11 AM    GFR 31 (L) 10/24/2018 01:11 AM    GFRAA 38 (L) 10/24/2018 01:11 AM        Other Pertinent Diagnostic Results:   CXR 10/22/2018  IMPRESSION:    Increasing patchy right greater than left mid and lower lung zone   opacities concerning for progression of multifocal pneumonia.   , DNP, APRN-CNS, ACHPN  Clinical Nurse Specialist, Palliative Care  Pager: [phone]    Palliative Care team also available on Voalte  Evenings, nights, weekends: On call pager  [phone]

## 2018-10-24 NOTE — Progress Notes
PHYSICAL THERAPY  ASSESSMENT      Name: Paul Benjamin        MRN: [id number]          DOB: [birth year]          Age: 73 y.o.  Admission Date: 10/22/2018             LOS: 2 days      Mobility  Patient Turn/Position: Chair  Progressive Mobility Level: Walk in room  Distance Walked (feet): 10 ft  Level of Assistance: Assist X1  Assistive Device: Hand Held  Time Tolerated: 11-30 minutes  Activity Limited By: Fatigue;Weakness;Shortness of air    Subjective  Significant hospital events: PMH significant for HTN, HLD, chronic HFpEF, DM, hypothyroidism, and restless leg syndrome who presented to OSH with AMS and hypoglycemia; found to have COVID-19. Overnight 7/28-7/29, pt became febrile with increasing oxygen requirement (O2 sat low 90s on 9 L/NC). Transferred to [facility] for worsening acute hypoxic respiratory failure in the setting of COVID-19 pneumonia. Requiring comfort flow.   Mental / Cognitive Status: Alert;Oriented;Cooperative;Follows Commands  Pain: Patient has no complaint of pain  Pain Interventions: Patient agrees to participate in therapy  Comments: Comfort flow at 70% on 60LPM Sp02 remained between 92-95%.  Ambulation Assist: Independent Mobility in [employer] without Device  Home Situation: Lives with Family(wife, uses cane)  Type of Home: House  Entry Stairs: 6-10 Stairs;Rail on 1 Side(10)  In-Home Stairs: 1-2 Flights of Stairs;Rail on 1 Side(18 steps to bedroom)  Comments: Reports owning DME from items his wife has needed in the past, unsure if equipment would appropriately work for him if needed.     ROM  LE ROM: WFL    Strength  Overall Strength: Generalized Weakness    Bed Mobility/Transfer  Bed Mobility: Supine to Sit: Standby Assist;Head of Bed Elevated;Use of Rail;Requires Extra Time;Safety Considerations  Transfer Type: Sit to/from Stand  Transfer: Assistance Level: From;Bed;To;Bed Side Chair;Minimal Assist  Transfer: Assistive Device: Chief of Staff Assist Transfers: Type Of Assistance: Verbal Cues;For Safety Considerations  End Of Activity Status: Up in Chair;Nursing Notified;Instructed Patient to Request Assist with Mobility;Instructed Patient to Use Call Light    Balance  Sitting Balance: Static Sitting Balance;Dynamic Sitting Balance;2 UE Support;Standby Assist  Standing Balance: Static Standing Balance;Dynamic Standing Balance;1 UE support;Minimal Assist    Gait  Gait Distance: 10 feet  Gait: Assistance Level: Minimal Assist;Management of Lines;Safety Considerations  Gait: Assistive Device: Hand Hold Assist  Gait: Descriptors: Decreased foot clearance RLE;Decreased foot clearance LLE;Pace: Slow;Step-To Gait;No balance loss  Comments: Performed gait up to bedside chair.    Education  Persons Educated: Patient  Patient Barriers To Learning: None Noted  Teaching Methods: Verbal Instruction  Patient Response: Verbalized Understanding  Topics: Plan/Goals of PT Interventions;Mobility Progression;Safety Awareness;Up with Assist Only;Importance of Increasing Activity;Ambulate With Nursing;Recommend Continued Therapy    Assessment/Progress  Impaired Mobility Due To: Decreased Activity Tolerance;Decreased Strength;Deconditioning;Medical Status Limitation  Assessment/Progress: Should Improve w/ Continued PT    AM-PAC 6 Clicks Basic Mobility Inpatient  Turning from your back to your side while in a flat bed without using bed rails: A Little  Moving from lying on your back to sitting on the side of a flatbed without using bedrails : A Little  Moving to and from a bed to a chair (including a wheelchair): A Little  Standing up from a chair using your arms (e.g. wheelchair, or bedside chair): A Little  To walk in hospital room: A Little  Climbing 3-5 steps with  a railing: A Lot  Raw Score: 17  Standardized (T-scale) Score: 39.67  Basic Mobility CMS 0-100%: 43.83  CMS G Code Modifier for Basic Mobility: CK    Goals  Goal Formulation: With Patient Time For Goal Achievement: 5 days, To, 7 days  Patient Will Go Supine To Sit: Independently  Patient Will Go Sit to Supine : Independently  Patient Will Transfer Bed/Chair: w/ Stand By Assist  Patient Will Transfer Sit to Stand: w/ Stand By Assist  Patient Will Ambulate: 51-100 Feet, w/ Stand By Assist(least restrictive device)  Patient Will Go Up / Down Stairs: 6-10 Stairs, w/ Stand By Assist    Plan  Treatment Interventions: Mobility Training;Strengthening  Plan Frequency: 3-5 Days per Week  PT Plan for Next Visit: sit<>stands, progress gait in room as able     PT Discharge Recommendations  Recommendation: Currently patient requires inpatient level of care. However, typical progression for patient condition would anticipate home with assistance at time of discharge.    Therapist  Versie Starks, PT, DPT [phone]  Date  10/24/2018

## 2018-10-25 LAB — POC GLUCOSE
Lab: 148 mg/dL — ABNORMAL HIGH (ref 70–100)
Lab: 152 mg/dL — ABNORMAL HIGH (ref 70–100)
Lab: 133 mg/dL — ABNORMAL HIGH (ref 70–100)
Lab: 116 mg/dL — ABNORMAL HIGH (ref 70–100)
Lab: 102 mg/dL — ABNORMAL HIGH (ref 70–100)
Lab: 111 mg/dL — ABNORMAL HIGH (ref 70–100)
Lab: 106 mg/dL — ABNORMAL HIGH (ref 70–100)
Lab: 121 mg/dL — ABNORMAL HIGH (ref 70–100)
Lab: 124 mg/dL — ABNORMAL HIGH (ref 70–100)
Lab: 131 mg/dL — ABNORMAL HIGH (ref >60)
Lab: 137 mg/dL — ABNORMAL HIGH (ref 70–100)
Lab: 189 mg/dL — ABNORMAL HIGH (ref 70–100)
Lab: 222 mg/dL — ABNORMAL HIGH (ref 70–100)
Lab: 235 mg/dL — ABNORMAL HIGH (ref 70–100)
Lab: 315 mg/dL — ABNORMAL HIGH (ref 70–100)
Lab: 298 mg/dL — ABNORMAL HIGH (ref 70–100)
Lab: 261 mg/dL — ABNORMAL HIGH (ref 70–100)
Lab: 196 mg/dL — ABNORMAL HIGH (ref 70–100)
Lab: 169 mg/dL — ABNORMAL HIGH (ref 70–100)
Lab: 164 mg/dL — ABNORMAL HIGH (ref 70–100)

## 2018-10-25 LAB — FERRITIN: Lab: 709 ng/mL — ABNORMAL HIGH (ref <1.0)

## 2018-10-25 LAB — LDH-LACTATE DEHYDROGENASE: Lab: 396 U/L — ABNORMAL HIGH (ref <1.0)

## 2018-10-25 LAB — MAGNESIUM: Lab: 2.5 mg/dL — ABNORMAL LOW (ref <1.0)

## 2018-10-25 LAB — D-DIMER: Lab: 974 ng{FEU}/mL — ABNORMAL HIGH (ref <500)

## 2018-10-25 LAB — CBC AND DIFF: Lab: 7.4 K/UL — ABNORMAL HIGH (ref 4.5–11.0)

## 2018-10-25 LAB — PHOSPHORUS: Lab: 2.9 mg/dL — ABNORMAL LOW (ref 2.0–4.5)

## 2018-10-25 LAB — COMPREHENSIVE METABOLIC PANEL: Lab: 143 MMOL/L — ABNORMAL LOW (ref 137–147)

## 2018-10-25 LAB — C REACTIVE PROTEIN (CRP): Lab: 10 mg/dL — ABNORMAL HIGH (ref <1.0)

## 2018-10-25 MED ORDER — INSULIN GLARGINE 100 UNIT/ML (3 ML) SC INJ PEN
30 [IU] | Freq: Two times a day (BID) | SUBCUTANEOUS | 0 refills | Status: DC
Start: 2018-10-25 — End: 2018-10-26
  Administered 2018-10-25: 23:00:00 30 [IU] via SUBCUTANEOUS

## 2018-10-25 MED ORDER — INSULIN ASPART 100 UNIT/ML SC FLEXPEN
5 [IU] | Freq: Three times a day (TID) | SUBCUTANEOUS | 0 refills | Status: DC
Start: 2018-10-25 — End: 2018-10-26

## 2018-10-25 MED ORDER — POTASSIUM CHLORIDE 20 MEQ PO TBTQ
40 meq | Freq: Once | ORAL | 0 refills | Status: CP
Start: 2018-10-25 — End: ?
  Administered 2018-10-25: 13:00:00 40 meq via ORAL

## 2018-10-25 MED ORDER — INSULIN ASPART 100 UNIT/ML SC FLEXPEN
0-12 [IU] | Freq: Before meals | SUBCUTANEOUS | 0 refills | Status: DC
Start: 2018-10-25 — End: 2018-11-01
  Administered 2018-10-30: 17:00:00 6 [IU] via SUBCUTANEOUS

## 2018-10-25 NOTE — Progress Notes
0730-Assumed care of pt. Bedside safety check completed. Pt. assessed and documented on per ICU flow sheets. Remains on CF 60L60%, all other VSS per trends.

## 2018-10-25 NOTE — Progress Notes
Pulmonary / Critical Care Progress Note    Paul Benjamin  [id number] Date:  10/25/2018  Admission Date: 10/22/2018  LOS: 3 days    Principal Problem:    Pneumonia due to COVID-19 virus  Active Problems:    CAD (coronary artery disease)    Hypertension    Mixed dyslipidemia    Sleep apnea    PLMD (periodic limb movement disorder)    Chronic heart failure with preserved ejection fraction (HFpEF) (HCC)    Acute respiratory failure with hypoxia (HCC)    Hyperglycemia    Brief Hospital Course:      Paul Benjamin is a 73 y/o male with PMH significant for HTN, HLD, chronic HFpEF, DM, hypothyroidism, and restless leg syndrome who presented to OSH with AMS and hypoglycemia; found to have COVID-19. Overnight 7/28-7/29, pt became febrile with increasing oxygen requirement (O2 sat low 90s on 9 L/NC). Transferred to [facility] for worsening acute hypoxic respiratory failure in the setting of COVID-19 pneumonia. Continues on comfort flow. ID following. On decadron and remdesivir, s/p convalescent plasma 7/30.     Assessment/Plan:      NEURO  Restless Leg Syndrome  - Hold PTA pramipexole d/t renal dysfunction  ???  PULM  Acute Hypoxic Respiratory Failure  COVID-19 Pneumonia   - Overnight 7/28-7/29, worsening hypoxia requiring 9 L HFNC --> transferred to [facility] for higher level of care  - CT chest at OSH 7/26: mildly enlarged heart; R > L lower lobe ground glass c/w pneumonia  - Per outside records, pt was not started on steroids???  - ABG on admit here: 7.46 / 30 / 94 / 22.9  - CXR on admit here: increasing patchy right > L mid and lower lung zone opacities concerning for progression of multifocal pneumonia  - Cxr 7/31: No significant changes to bilateral opacities  - Currently on comfort flow: 60L and 50%   PLAN  - Wean comfort flow/O2 as able  - Decadron 6 mg daily per protocol ( 7/29- 8/7)  - Encourage prone position but has been refusing d/t discomfort  - ID w/u and tx as below  OSA  - PTA CPAP Q HS  ???  CV Chronic Diastolic Heart Failure  Bicuspid Aortic Valve  - Follows with Dr. Pierre Bali in heart failure clinic   - Dry weight appears to be 267 pounds  - Most recent echo 05/06/2018: LVEF 75% w/ normal diastolic function; mild concentric LVH, mild LA dilatation, mild RV and RA dilatation; bicuspid aortic valve sclerosis without stenosis; no pericardial effusion; PASP 32 mmHg  - BNP 67 (previously 25-35)  - Net - in 24hr  PLAN  - Continue PTA meds: lasix 80 mg every other day, chlorthalidone 25 mg daily   HTN/ HLD  CAD  - SBP 110s-130s; HR 70s (A-paced)  - Continue PTA meds: coreg 25 mg BID, amlodipine 10 mg daily, simvastatin???20 mg daily  Hx Atrial Fibrillation s/p Ablation  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  GI  Diarrhea (improved)  - Likely 2/2 COVID-19  - Last BM 7/30  PLAN  - Continue to monitor???  ???  RENAL  CKD  Hypokalemia  High Anion Gap Metabolic Acidosis- resolved   - Baseline Cr appears to be 1.85-2.6 since February 2020  - Lactate 1.0 (7/29)  - Cr 2.74 on admit to [facility] --> 2.10 this  AM  - K 3.4, bicarb 19, AG 13  - I/O: net - 417mL/24hr; -1.9L since admit  PLAN  - Monitor I/O  - Avoid nephrotoxins as able  - Aggressively replace electrolytes  ???  ENDO  Type 2 Diabetes Mellitus  - PTA regimen: levemir???30???units BID + aspart 8 units TID with meals  - HgbA1C 06/02/2018: 8.2  - POC glucose 400s overnight  PLAN  - Continue insulin drip (Received 108 units from 7/31 at 0700 to 8/1 at 0700)  - Evaluate 8/1 to transition back to lantus + novolog (meal time and sliding scale)    Hypothyroidism  - TSH 0.58  - Continue PTA levothyroxine  ???  ID  COVID-19 Pneumonia  - COVID-19 + on 7/26  - Chest imaging as above  - Pt received vancomycin and zosyn at OSH for 5 days  - Today: Wbc 4.5; afebrile  - Blood cx 7/29: ngtd  - UA 7/29: negative - Procalcitonin 1.54 (7/29) --> 1.79 (7/30)  - MRSA pneumonia PCR 7/29: negative  - Strep pneumo and legionella urine Ags 7/29: negative  - IL-6 7/29: 137.5  - Inflammatory markers as of 7/31:               D-dimer 1038 --> 876 -> 1011 --> 974               Ferritin 511 --> 527 -> 639 --> 709               CRP 27.38 --> 28.37 -> 20.65 --> 10.71               LDH 346 --> 325 -> 354 --> 396  - S/p convalescent plasma 7/30    PLAN  - ID following  - Redemsivir 7/29; will continue x 5 day course (through 8/2)  - Dexamethasone 6 mg daily x 10 days (through 8/7)  - Given Il-6 greater than 30, will discuss with patient whether Tocilizumab is warranted   - Daily inflammatory markers  - D/c levaquin and switch to doxycyline x 3 days (starting 8/1)  - Anticoagulation as below  ???  HEME  Normocytic Anemia  - Hgb 9.2, plt 164  - Xa level 7/31: 0.20  PLAN  - Continue to monitor  - High dose lovenox ppx (30 mg BID) -> continue on this dose  - Continue managing lovenox dosing with pharmacy assistance     FEN  - IVF: None  - Review electrolytes and replace as needed  - Cardiac/DM Diet     PPX:  Lines: PIV x 2  Drains/Tubes: None  Indwelling Urinary Catheter: No  DVT: SCDs; Lovenox  GI: Not indicated  PT/OT: Yes  Insulin: Yes    Code Status: FULL  Disposition/Family:  ICU    Rush Barer, MD  Internal Medicine, PGY-2  Pager: [phone]    Patient seen and discussed with Dr. Corky Downs   _______________________________________________________________    Subjective:     Paul Benjamin is a 73 y.o. male Patient reports feeling the same as yesterday. Denies fevers/chills. Denies productive cough. Reports shortness of breath is same as yesterday.     ROS: Denies HA, chest pain/pressure, SOB, cough, abdominal pain, N/V.    Objective:     Medications:  Scheduled Meds:allopurinoL (ZYLOPRIM) tablet 100 mg, 100 mg, Oral, BID  amLODIPine (NORVASC) tablet 10 mg, 10 mg, Oral, QDAY  carvediloL (COREG) tablet 25 mg, 25 mg, Oral, BID chlorthalidone (HYGROTON) tablet 25 mg, 25 mg, Oral, QDAY  dexamethasone (DECADRON) injection 6 mg, 6 mg, Intravenous, QDAY  doxycycline (VIBRAMYCIN) tablet 100 mg, 100 mg, Oral, BID  enoxaparin (LOVENOX) syringe 30 mg, 30 mg, Subcutaneous, BID  furosemide (LASIX) tablet 80 mg, 80 mg, Oral, Q48H*  levothyroxine (SYNTHROID) tablet 75 mcg, 75 mcg, Oral, QDAY 30 min before breakfast  remdesivir (EUA) 100 mg in sodium chloride 0.9% (NS) 250 mL IVPB, 100 mg, Intravenous, QDAY  simvastatin (ZOCOR) tablet 20 mg, 20 mg, Oral, QHS    Continuous Infusions:  ??? insulin regular 100 units/NS 100 mL IV drip (premade) 1.5 Units/hr (10/25/18 0323)     PRN and Respiratory Meds:acetaminophen Q4H PRN                       Vital Signs: Last Filed                  Vital Signs: 24 Hour Range   BP: 143/77 (08/01 0600)  Temp: 36.9 ???C (98.5 ???F) (08/01 0400)  Pulse: 70 (08/01 0600)  Respirations: 22 PER MINUTE (08/01 0600)  SpO2: 93 % (08/01 0600)  SpO2 Pulse: 69 (08/01 0600) BP: (114-147)/(62-118)   Temp:  [36.4 ???C (97.6 ???F)-37.1 ???C (98.7 ???F)]   Pulse:  [70-76]   Respirations:  [12 PER MINUTE-24 PER MINUTE]   SpO2:  [88 %-98 %]      Vitals:    10/22/18 1618 10/23/18 0600   Weight: 117.1 kg (258 lb 2.5 oz) 116.6 kg (257 lb 0.9 oz)           Intake/Output Summary:  (Last 24 hours)    Intake/Output Summary (Last 24 hours) at 10/25/2018 0620  Last data filed at 10/25/2018 0600  Gross per 24 hour   Intake 1912.27 ml   Output 2195 ml   Net -282.73 ml         Physical Exam:      General: alert, cooperative, no distress, appears stated age  Head: normocephalic, without obvious abnormality, atraumatic  Eyes: conjunctivae/corneas clear, PERRL  Lungs: clear to auscultation bilaterally, diminished in bases, non-labored on comfort flow 60L and 50%  Heart: regular rate and rhythm, no murmur appreciated  Abdomen: soft, non-tender, round. Bowel sounds present.  Extremities: normal, atraumatic, no cyanosis; Trace BLE edema Peripheral pulses: 2+ and symmetric, all extremities  Cap Refill: 3 seconds  Skin: chronic lower extremity skin changes with wounds   Neurologic: grossly normal    Laboratory:  LABS:  Recent Labs     10/22/18  1640 10/23/18  0245 10/24/18  0111 10/25/18  0304   NA 139 142 139 143   K 3.2* 3.1* 3.4* 3.5   CL 106 107 107 110   CO2 20* 20* 19* 21   GAP 13* 15* 13* 12   BUN 66* 64* 69* 65*   CR 2.74* 2.41* 2.10* 1.86*   GLU 230* 236* 302* 96   CA 7.8* 7.5* 7.3* 7.5*   ALBUMIN 3.4* 3.0* 3.0* 3.0*   MG 2.3 2.3 2.3 2.5   PO4  --  5.3* 2.5 2.9   TSH 0.58  --   --   --        Recent Labs     10/22/18  1640 10/23/18  0245 10/24/18  0111 10/25/18  0304   WBC 4.4* 4.3* 4.5 7.4   HGB 9.9* 9.1* 9.2* 8.8*   HCT 27.9* 26.0* 26.3* 25.5*   PLTCT 146* 147* 164 198   INR 1.2  --   --   --  PTT 47.0*  --   --   --    AST 23 21 19 19    ALT 15 13 14 15    ALKPHOS 45 41 44 47      Estimated Creatinine Clearance: 45.2 mL/min (A) (based on SCr of 1.86 mg/dL (H)).  Vitals:    10/22/18 1618 10/23/18 0600   Weight: 117.1 kg (258 lb 2.5 oz) 116.6 kg (257 lb 0.9 oz)      Recent Labs     10/22/18  1640   PHART 7.46*   PO2ART 94                 Radiology and Other Diagnostic Procedures Review:    Reviewed pertinent studies.

## 2018-10-26 LAB — CBC AND DIFF: Lab: 7.7 K/UL — ABNORMAL LOW (ref >60)

## 2018-10-26 LAB — POC GLUCOSE
Lab: 133 mg/dL — ABNORMAL HIGH (ref 70–100)
Lab: 160 mg/dL — ABNORMAL HIGH (ref 70–100)
Lab: 179 mg/dL — ABNORMAL HIGH (ref 70–100)
Lab: 306 mg/dL — ABNORMAL HIGH (ref 70–100)
Lab: 311 mg/dL — ABNORMAL HIGH (ref 70–100)
Lab: 337 mg/dL — ABNORMAL HIGH (ref 70–100)

## 2018-10-26 LAB — CULTURE-RESP,LOWER W/SENSITIVITY: Lab: LOW

## 2018-10-26 LAB — D-DIMER: Lab: 738 ng{FEU}/mL — ABNORMAL HIGH (ref <500)

## 2018-10-26 LAB — FERRITIN: Lab: 701 ng/mL — ABNORMAL HIGH (ref 30–300)

## 2018-10-26 LAB — PHOSPHORUS: Lab: 3.7 mg/dL — ABNORMAL HIGH (ref 2.0–4.5)

## 2018-10-26 LAB — MAGNESIUM: Lab: 2.4 mg/dL — ABNORMAL LOW (ref 1.6–2.6)

## 2018-10-26 LAB — C REACTIVE PROTEIN (CRP): Lab: 11 mg/dL — ABNORMAL HIGH (ref <1.0)

## 2018-10-26 LAB — COMPREHENSIVE METABOLIC PANEL: Lab: 141 MMOL/L — ABNORMAL LOW (ref 137–147)

## 2018-10-26 LAB — LDH-LACTATE DEHYDROGENASE: Lab: 464 U/L — ABNORMAL HIGH (ref >60)

## 2018-10-26 MED ORDER — INSULIN GLARGINE 100 UNIT/ML (3 ML) SC INJ PEN
30 [IU] | Freq: Every evening | SUBCUTANEOUS | 0 refills | Status: DC
Start: 2018-10-26 — End: 2018-10-26

## 2018-10-26 MED ORDER — ACETAMINOPHEN 500 MG PO TAB
1000 mg | ORAL | 0 refills | Status: DC | PRN
Start: 2018-10-26 — End: 2018-11-01

## 2018-10-26 MED ORDER — IPRATROPIUM-ALBUTEROL 20-100 MCG/ACTUATION IN MIST
1 | Freq: Four times a day (QID) | RESPIRATORY_TRACT | 0 refills | Status: DC
Start: 2018-10-26 — End: 2018-11-01
  Administered 2018-10-26: 23:00:00 1 via RESPIRATORY_TRACT

## 2018-10-26 MED ORDER — INSULIN ASPART 100 UNIT/ML SC FLEXPEN
10 [IU] | Freq: Three times a day (TID) | SUBCUTANEOUS | 0 refills | Status: DC
Start: 2018-10-26 — End: 2018-10-27

## 2018-10-26 MED ORDER — INSULIN GLARGINE 100 UNIT/ML (3 ML) SC INJ PEN
30 [IU] | Freq: Two times a day (BID) | SUBCUTANEOUS | 0 refills | Status: DC
Start: 2018-10-26 — End: 2018-10-26

## 2018-10-26 MED ORDER — NPH INSULIN HUMAN RECOMB 100 UNIT/ML (3 ML) SC PEN
20 [IU] | Freq: Two times a day (BID) | SUBCUTANEOUS | 0 refills | Status: DC
Start: 2018-10-26 — End: 2018-10-27
  Administered 2018-10-26: 16:00:00 20 [IU] via SUBCUTANEOUS

## 2018-10-26 NOTE — Transfer Summaries
In-Hospital Transfer Note    Admission Diagnosis:    Admission Date: 10/22/2018  Active Hospital Problem List:  Principal Problem:    Pneumonia due to COVID-19 virus  Active Problems:    CAD (coronary artery disease)    Hypertension    Mixed dyslipidemia    Sleep apnea    PLMD (periodic limb movement disorder)    Chronic heart failure with preserved ejection fraction (HFpEF) (HCC)    Acute respiratory failure with hypoxia (HCC)    Hyperglycemia    Hospital Course: COVID-19 pneumonia  Significant Medication Information (to include antibiotic duration/indication, anticoagulation and steroids, etc.):    [name] is a 73 year old male who presented from an outside hospital with altered mental status and hypoglycemia.  Patient was found to be COVID-19 positive on 10/19/2018.  At outside hospital, patient received 5 days course of vancomycin and Zosyn.  Patient was transferred for worsening acute hypoxic respiratory failure requiring comfort flow.  Patient was started on Decadron 6 mg daily (to end 8/7).  Patient was also started on day severe 7/29 which we completed 8/2.  ID was consulted and decision was made to defer starting Tocilizumab at this time.  Additionally, patient was also started on doxycycline 100 mg twice daily to end 10/27/2018 given elevated procal.  Patient also currently on increased prophylactic dose of Lovenox per COVID-19 protocol given patient's elevated d-dimer.     Patient continues to clinically improve and is currently requiring 4 L nasal cannula.  Patient does not wear any oxygen during the day at home.  Patient does not appear to be in volume overload and thus home diuresis was resumed with a net goal of even daily.    Patient's blood sugars were difficult to adequately control given the initiation of dexamethasone.  Patient was started on insulin drip and was transitioned to subcu insulin.  Patient no longer requiring insulin drip on day of transfer to the floor.    Procedures With Dates: N/A Consults: Infectious disease  Follow-Up Items: None    Paul Barer, MD  Pager

## 2018-10-26 NOTE — Progress Notes
Internal Medicine Daily Progress Note      Name:  EHAN COSNER                                             MRN:  [id number]   Admission Date:  10/22/2018    ASSESSMENT & PLAN     Mr Larmon is a pleasant 73 yo M with a PMHx significant for HFpEF, CAD (status post DES to LAD 10/28/2005B), Essential HTN, Paroxysmal Atrial Fibrillation, SSS/Tach-Brady Syndrome status post PPM (7/74/2008), Dyslipidemia, Sleep Apnea/Periodic Limb Movement Disorder, Hypothyroidism and T2DM (insulin dependent) who presented to outside hospital ([place], [place]) for worsening Acute Hypoxic Respiratory Failure secondary to CoVID-19 PNA on 10/22/2018. Patient swabbed CoVID-19 positive on 7/26 and subsequently hospitalized at OSH. Transferred to [facility] on 7/29.    MICU Course (7/29-8/2)  ??? Mr.Flahive is a 73 year old male who presented from an outside hospital with altered mental status and hypoglycemia.  Patient was found to be COVID-19 positive on 10/19/2018.  At outside hospital, patient received 5 days course of vancomycin and Zosyn (not continued on admission here at [facility]).  Patient was transferred for worsening acute hypoxic respiratory failure requiring comfort flow.  Patient started on Decadron 6 mg daily (to end 8/7).  Patient was also started on Remdesevir on 7/29 which was completed 8/2.  ID was consulted and decision was made to defer starting Tocilizumab at this time. Additionally, patient was also started on doxycycline 100 mg twice daily to end 10/27/2018 given elevated procal.  Patient also currently on increased prophylactic dose of Lovenox per COVID-19 protocol given patient's elevated d-dimer.   ??? Patient blood pressures were difficult to control in the setting of dexamethasone.  Briefly required insulin drip to control blood glucose levels and subsequently transition to subcu insulin.  Patient was no longer requiring insulin drip on the day of transfer to the floor. ??? Oxygen requirements the day of transfer to the floor, 4 L of O2 via NC.    Current Assessment:   ??? Acute Hypoxic Respiratory Failure secondary to CoVID-19 PNA  ??? CoVID Inflammatory labs/markers:  ??? D-Dimer: 1,038 (admission) --> 876 --> 1,001 --> 974 -->738  ??? LDH: 346 (on admission)-->396 --> 464  ??? Ferritin: 511 (on admission) --> 701  ??? CRP 27.38 (admission) --> 11.34  ??? IL6 on admission: 137.5  ??? Procalcitonin on admission: 1.54; Urine strep/legionella, MRSA PNA negative   ??? Blood Cx on admission: NGTD   ??? ABG on admission: 7.46/30/94  ??? CXR on admission: Increasing patchy right greater than left mid and lower lung zone opacity concerning for progression of multifocal pneumonia.  ??? CXR (10/24/2018): No significant change of patchy bilateral pulmonary opacities  ??? CoVID-19 regimen during hospitalization:  ??? Convalescent Plasma (7/30, 7/31)  ??? Doxycycline (8/1-current w/ expectation to complete a 5 day course)  ??? Remdesivir (7/29-8/2)  ??? Dexamethasone (7/29-8/7)  ??? Lovenox 30 mg twice daily  ??? Respimat 4 times daily  ??? Encouraging prone position, however, it appears due to discomfort is not doing it   ??? Diarrhea secondary to CoVID-19 I improving   ??? HFpEF I stable, nothing to suggest acute decompensation on admission  ??? Essential HTN  ??? Follows with [facility] specialist, Dr Pierre Bali (last seen in clinic on 08/28/2018)  ??? Goal Dry weight:267 lbs  ??? BNP on admission: 67  ??? Prior to  arrival regimen: Carvedilol 25 mg twice daily, amlodipine 10 mg daily, chlorthalidone 25 mg daily, furosemide 80 mg daily (to increase to twice daily if weight is greater than 272 pounds)  ??? CAD (status post DES to LAD 01/21/2004)  ??? Paroxysmal Atrial Fibrillation  ??? (05/2007) sotalol initiation and cardioversion  ??? (09/23/2007) status post ablation  ??? Prior to arrival regimen: Carvedilol 25 mg twice daily (continued on admission)  ??? Not on [facility] prior to admission  ??? 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 Dyslipidemia ??? Prior to arrival regimen: Zocor 20 mg nightly (continued on admission)  ??? Sleep Apnea/Periodic Limb Movement Disorder  ??? Prior to arrival regimen: Mirapex 1.5 mg nightly   ??? Prior to arrival CPAP/BiPAP  ??? Hypothyroidism  ??? Prior to arrival regimen: Levothyroxine 75 mcg daily (continued on admission)  ??? T2DM (insulin dependent  ??? Prior to arrival regimen: Levemir 30 units twice daily, aspart 8 units 3 times daily with meals, glimepiride 4 mg twice daily with meals  ??? Current regimen: NPH 25 units twice daily, aspart 10 units 3 times daily with meals, MDCF  ??? 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 Baseline Cr ~ 1.8-2  ??? Debility/Deconditioning  ??? Secondary to CoVID-19 illness    Labs/Imaging/Medications/Consultant recommendations reviewed (if applciable)    Current Plan:   ???Acute Hypoxic Respiratory Failure secondary to CoVID-19 PNA  ??? Daily inflammatory markers  ??? Continue dexamethasone IV 6 mg daily expectation to complete a 10-day course (doxycycline/remedy severe completed on 8/2)  ??? Continue to wean O2 as tolerated  ??? Respimat 4 times daily  ??? Continue Lovenox 30 mg twice daily  ???Debility/deconditioning  ??? PT/OT        FEN: no IVF, electrolytes reviewed, DIET CARDIAC (LOW FAT/LOW NA) & DIABETIC   Prophalyxis:  Lovenox  Disposition: Med ICU 3 - 0103  Code status: Full Code (Discussed on 10/26/2018)        Ezzard Flax, MD       10/26/2018  Internal Medicine         [place]  Ezzard Flax, MD  or page:  [phone]    In addition to my initial encounter from [phone] (physical exam, review of labs/imaging/medications, coordination of care with primary floor staff), I spent an additional 35 minutes ([phone]) reviewing OSH course, outpatinet clinic notes/records, procedures.     Subjective     In brief, had the pleasure meeting with and evaluating/examining Mr. Guay's afternoon.  I discussed the above assessment/plan and attempt answer questions best my clinical knowledge.  Indicated he understands and is agreeable to the above assessment/plan.    This afternoon, he appears to be in no gross distress, normal, mood somewhat low, as well as, he continues to complain of shortness of breath and extreme fatigue/weakness.    Otherwise, ROS described below    Review of Systems   A comprehensive 14-point ROS  was negative except for:      Review of Systems   Constitutional: Positive for malaise/fatigue. Negative for chills, diaphoresis, fever and weight loss.   HENT: Negative for congestion, ear discharge, ear pain, hearing loss, nosebleeds, sinus pain, sore throat and tinnitus.    Eyes: Negative for blurred vision, double vision, photophobia, pain, discharge and redness.   Respiratory: Positive for cough and shortness of breath. Negative for hemoptysis, sputum production, wheezing and stridor.    Cardiovascular: Negative for chest pain, palpitations, orthopnea, claudication, leg swelling and PND.  Gastrointestinal: Negative for abdominal pain, blood in stool, constipation, diarrhea, heartburn, melena, nausea and vomiting.   Genitourinary: Negative for dysuria, flank pain, frequency, hematuria and urgency.   Musculoskeletal: Negative for back pain, falls, joint pain, myalgias and neck pain.   Skin: Negative for itching and rash.   Neurological: Negative for dizziness, tingling, tremors, sensory change, speech change, focal weakness, seizures, loss of consciousness, weakness and headaches.   Endo/Heme/Allergies: Negative for polydipsia.       Past medical history    has a past medical history of Atrial fibrillation (HCC), coronary artery disease, Hypercholesterolemia, Hypertension, Sleep apnea, and SSS (sick sinus syndrome) (HCC).     Past surgical history    has a past surgical history that includes pacemaker placement (09/30/06).       Family history     Family History   Problem Relation Age of Onset   ??? Asthma Father ??? Cardiovascular Father    ??? Hypertension Mother      Social history     Social History     Socioeconomic History   ??? Marital status: Married     Spouse name: Not on file   ??? Number of children: Not on file   ??? Years of education: Not on file   ??? Highest education level: Not on file   Occupational History     Employer: [employer]     Comment: retired   Tobacco Use   ??? Smoking status: Former Smoker     Packs/day: 1.00     Last attempt to quit: 03/26/1966     Years since quitting: 52.6   ??? Smokeless tobacco: Never Used   Substance and Sexual Activity   ??? Alcohol use: No   ??? Drug use: No   ??? Sexual activity: Not on file   Other Topics Concern   ??? Not on file   Social History Narrative   ??? Not on file      Allergies   Lisinopril; Cefuroxime; Clindamycin; Sulfa (sulfonamide antibiotics); Plavix [clopidogrel]; and Quinine    Medications     Current Facility-Administered Medications   Medication   ??? acetaminophen (TYLENOL) tablet 650 mg   ??? allopurinoL (ZYLOPRIM) tablet 100 mg   ??? amLODIPine (NORVASC) tablet 10 mg   ??? carvediloL (COREG) tablet 25 mg   ??? chlorthalidone (HYGROTON) tablet 25 mg   ??? dexamethasone (DECADRON) injection 6 mg   ??? doxycycline (VIBRAMYCIN) tablet 100 mg   ??? enoxaparin (LOVENOX) syringe 30 mg   ??? furosemide (LASIX) tablet 80 mg   ??? insulin aspart U-100 (NOVOLOG FLEXPEN) injection PEN 0-12 Units   ??? insulin aspart U-100 (NOVOLOG FLEXPEN) injection PEN 10 Units   ??? insulin NPH (HUMULIN N KwikPen) injection PEN 20 Units   ??? levothyroxine (SYNTHROID) tablet 75 mcg   ??? remdesivir (EUA) 100 mg in sodium chloride 0.9% (NS) 250 mL IVPB   ??? simvastatin (ZOCOR) tablet 20 mg          Physical exam     BP: 129/62 (08/02 1418)  Temp: 36.3 ???C (97.3 ???F) (08/02 1418)  Pulse: 76 (08/02 1418)  Respirations: 24 PER MINUTE (08/02 1156)  SpO2: 93 % (08/02 1418)  SpO2 Pulse: 68 (08/02 1100) BP: (106-159)/(62-86)   Temp:  [36.1 ???C (96.9 ???F)-36.9 ???C (98.4 ???F)]   Pulse:  [70-76] Respirations:  [12 PER MINUTE-25 PER MINUTE]   SpO2:  [90 %-98 %]   Vitals:    10/22/18 1618 10/23/18 0600   Weight: 117.1  kg (258 lb 2.5 oz) 116.6 kg (257 lb 0.9 oz)        General:  Alert and response to question appropriately.  VS as above. No acute distress .     Eyes: No conjunctival injection.  PERRLA.   No scleral icterus.    Ears, nose, mouth, and throat : No erythema.   MMM.   No erythema, exudate noted.    Neck:  Symmetric appearance without crepitus, no obvious mass or noticeable swelling. No JVD.   Lungs:  No use of accessory muscles.  Wheezing throughout. Currently on 4 L via NC  Cardiovascular: Regular rate, S1, S2 normal, no murmurs, clicks, rubs, or gallops appreciated .  2+ and symmetric, all extremities.  No edema in BLE.   Abdomen:  BS+, soft, no guarding or rigidity.  Nontender to palpation without palpable masses.  No rebound tenderness.   Skin: Skin color normal, no obvious evidence of rashes. Chronic bilateral stasis derm noted.   Musculoskeletal:  ROM normal. No joints swelling or erythema.   Neurologic:  Alerted and oriented .    Psych:  Alerted , calm, normal affect    Labs        Recent Labs     10/25/18  0304 10/26/18  0405   NA 143 141   K 3.5 4.0   CL 110 108   CO2 21 22   BUN 65* 69*   CR 1.86* 2.02*   GAP 12 11   MG 2.5 2.4   PO4 2.9 3.7   GFR 36* 32*   ALBUMIN 3.0* 3.0*   TOTBILI 0.4 0.4   AST 19 22   ALT 15 15    Recent Labs     10/25/18  0304 10/26/18  0405   WBC 7.4 7.7   HGB 8.8* 10.1*   HCT 25.5* 29.3*   PLTCT 198 208   MCV 83.7 84.0          Radiology and Other Diagnostic Procedures Review     No results found.

## 2018-10-26 NOTE — Progress Notes
Pulmonary / Critical Care Progress Note    Paul Benjamin  [id number] Date:  10/26/2018  Admission Date: 10/22/2018  LOS: 4 days    Principal Problem:    Pneumonia due to COVID-19 virus  Active Problems:    CAD (coronary artery disease)    Hypertension    Mixed dyslipidemia    Sleep apnea    PLMD (periodic limb movement disorder)    Chronic heart failure with preserved ejection fraction (HFpEF) (HCC)    Acute respiratory failure with hypoxia (HCC)    Hyperglycemia    Brief Hospital Course:      Paul Benjamin is a 73 y/o male with PMH significant for HTN, HLD, chronic HFpEF, DM, hypothyroidism, and restless leg syndrome who presented to OSH with AMS and hypoglycemia; found to have COVID-19. Overnight 7/28-7/29, pt became febrile with increasing oxygen requirement (O2 sat low 90s on 9 L/NC). Transferred to [facility] for worsening acute hypoxic respiratory failure in the setting of COVID-19 pneumonia. Continues on comfort flow. ID following. On decadron and remdesivir, s/p convalescent plasma 7/30.     Assessment/Plan:      NEURO  Restless Leg Syndrome  - Hold PTA pramipexole d/t renal dysfunction  ???  PULM  Acute Hypoxic Respiratory Failure- improving   COVID-19 Pneumonia   - Overnight 7/28-7/29, worsening hypoxia requiring 9 L HFNC --> transferred to [facility] for higher level of care  - CT chest at OSH 7/26: mildly enlarged heart; R > L lower lobe ground glass c/w pneumonia  - Per outside records, pt was not started on steroids???  - ABG on admit here: 7.46 / 30 / 94 / 22.9  - CXR on admit here: increasing patchy right > L mid and lower lung zone opacities concerning for progression of multifocal pneumonia  - Cxr 7/31: No significant changes to bilateral opacities  - Currently on comfort flow: 60L and 50%   PLAN  - Off Comfort flow. Requiring NC since 8/1. Stable for floor   - Decadron 6 mg daily per protocol ( 7/29- 8/7)  - Encourage prone position but has been refusing d/t discomfort  - ID w/u and tx as below  OSA - PTA CPAP Q HS  ???  CV  Chronic Diastolic Heart Failure  Bicuspid Aortic Valve  - Follows with Dr. Pierre Bali in heart failure clinic   - Dry weight appears to be 267 pounds  - Most recent echo 05/06/2018: LVEF 75% w/ normal diastolic function; mild concentric LVH, mild LA dilatation, mild RV and RA dilatation; bicuspid aortic valve sclerosis without stenosis; no pericardial effusion; PASP 32 mmHg  - BNP 67 (previously 25-35)  - Net - in 24hr  PLAN  - Continue PTA meds: lasix 80 mg every other day, chlorthalidone 25 mg daily     HTN/ HLD  CAD  - SBP 110s-130s; HR 70s (A-paced)  - Continue PTA meds: coreg 25 mg BID, amlodipine 10 mg daily, simvastatin???20 mg daily  Hx Atrial Fibrillation s/p Ablation  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  GI  Diarrhea (improved)  - Likely 2/2 COVID-19  - Last BM 7/30  PLAN  - Continue to monitor???  ???  RENAL  CKD  Hypokalemia  High Anion Gap Metabolic Acidosis- resolved   - Baseline Cr appears to be 1.85-2.6 since February 2020  - Lactate 1.0 (7/29)  -  Cr 2.74 on admit to [facility] --> 2.10 this AM  - K 3.4, bicarb 19, AG 13  - I/O: net - 455mL/24hr; -1.9L since admit  PLAN  - Monitor I/O  - Avoid nephrotoxins as able  - Aggressively replace electrolytes  ???  ENDO  Type 2 Diabetes Mellitus  - PTA regimen: levemir???30???units BID + aspart 8 units TID with meals  - HgbA1C 06/02/2018: 8.2  - POC glucose 400s overnight  PLAN  - NPH 20 units BID and 10 units aspart TID with meals. MDCF   - Titrate off insulin ggt as able     Hypothyroidism  - TSH 0.58  - Continue PTA levothyroxine  ???  ID  COVID-19 Pneumonia  - COVID-19 + on 7/26  - Chest imaging as above  - Pt received vancomycin and zosyn at OSH for 5 days  - Today: Wbc 4.5; afebrile  - Blood cx 7/29: ngtd  - UA 7/29: negative  - Procalcitonin 1.54 (7/29) --> 1.79 (7/30)  - MRSA pneumonia PCR 7/29: negative - Strep pneumo and legionella urine Ags 7/29: negative  - IL-6 7/29: 137.5  - Inflammatory markers as of 7/31:               D-dimer 1038 --> 876 -> 1011 --> 974               Ferritin 511 --> 527 -> 639 --> 709               CRP 27.38 --> 28.37 -> 20.65 --> 10.71               LDH 346 --> 325 -> 354 --> 396  - S/p convalescent plasma 7/30    PLAN  - ID following  - Redemsivir 7/29; will continue x 5 day course (through 8/2)  - Dexamethasone 6 mg daily x 10 days (through 8/7)  - Doxycycline 100 mg BID until 8/3  - Discussed with ID. Will defer on starting Tocilizumab at this itme  - Daily inflammatory markers  - Anticoagulation as below  ???  HEME  Normocytic Anemia  - Hgb 9.2, plt 164  - Xa level 7/31: 0.20  PLAN  - Continue to monitor  - High dose lovenox ppx (30 mg BID) -> continue on this dose  - Continue managing lovenox dosing with pharmacy assistance     FEN  - IVF: None  - Review electrolytes and replace as needed  - Cardiac/DM Diet     PPX:  Lines: PIV x 2  Drains/Tubes: None  Indwelling Urinary Catheter: No  DVT: SCDs; Lovenox  GI: Not indicated  PT/OT: Yes  Insulin: Yes    Code Status: FULL  Disposition/Family:  Floor status     Rush Barer, MD  Internal Medicine, PGY-2  Pager: [phone]    Patient seen and discussed with Dr. Corky Downs   _______________________________________________________________    Subjective:     Paul Benjamin is a 73 y.o. male Patient reports feeling much improved today compared to yesterday. His breathing has improved and denies any fevers/chills, diarrhea, or cough.     ROS: Denies HA, chest pain/pressure, SOB, cough, abdominal pain, N/V.    Objective:     Medications:  Scheduled Meds:allopurinoL (ZYLOPRIM) tablet 100 mg, 100 mg, Oral, BID  amLODIPine (NORVASC) tablet 10 mg, 10 mg, Oral, QDAY  carvediloL (COREG) tablet 25 mg, 25 mg, Oral, BID  chlorthalidone (HYGROTON) tablet 25 mg, 25 mg, Oral, QDAY  dexamethasone (DECADRON) injection  6 mg, 6 mg, Intravenous, QDAY doxycycline (VIBRAMYCIN) tablet 100 mg, 100 mg, Oral, BID  enoxaparin (LOVENOX) syringe 30 mg, 30 mg, Subcutaneous, BID  furosemide (LASIX) tablet 80 mg, 80 mg, Oral, Q48H*  insulin aspart U-100 (NOVOLOG FLEXPEN) injection PEN 0-12 Units, 0-12 Units, Subcutaneous, ACHS (22)  insulin aspart U-100 (NOVOLOG FLEXPEN) injection PEN 5 Units, 5 Units, Subcutaneous, TID w/ meals  insulin glargine (LANTUS SOLOSTAR) injection PEN 30 Units, 30 Units, Subcutaneous, QHS  levothyroxine (SYNTHROID) tablet 75 mcg, 75 mcg, Oral, QDAY 30 min before breakfast  remdesivir (EUA) 100 mg in sodium chloride 0.9% (NS) 250 mL IVPB, 100 mg, Intravenous, QDAY  simvastatin (ZOCOR) tablet 20 mg, 20 mg, Oral, QHS  SODIUM CHLORIDE 0.9 % IV SOLP (Cabinet Override), , , NOW    Continuous Infusions:    PRN and Respiratory Meds:acetaminophen Q4H PRN                       Vital Signs: Last Filed                  Vital Signs: 24 Hour Range   BP: 135/79 (08/02 0500)  Temp: 36.6 ???C (97.8 ???F) (08/02 0000)  Pulse: 70 (08/02 0500)  Respirations: 16 PER MINUTE (08/02 0500)  SpO2: 98 % (08/02 0500)  SpO2 Pulse: 70 (08/02 0500) BP: (106-157)/(61-92)   Temp:  [36.1 ???C (96.9 ???F)-36.7 ???C (98 ???F)]   Pulse:  [70-72]   Respirations:  [12 PER MINUTE-27 PER MINUTE]   SpO2:  [89 %-98 %]      Vitals:    10/22/18 1618 10/23/18 0600   Weight: 117.1 kg (258 lb 2.5 oz) 116.6 kg (257 lb 0.9 oz)           Intake/Output Summary:  (Last 24 hours)    Intake/Output Summary (Last 24 hours) at 10/26/2018 0617  Last data filed at 10/26/2018 0400  Gross per 24 hour   Intake 1008.24 ml   Output 2100 ml   Net -1091.76 ml         Physical Exam:      General: alert, cooperative, no distress, appears stated age  Head: normocephalic, without obvious abnormality, atraumatic  Eyes: conjunctivae/corneas clear, PERRL  Lungs: clear to auscultation bilaterally, diminished in bases, non-labored now on NC  Heart: regular rate and rhythm, no murmur appreciated Abdomen: soft, non-tender, round. Bowel sounds present.  Extremities: normal, atraumatic, no cyanosis; Trace BLE edema  Peripheral pulses: 2+ and symmetric, all extremities  Cap Refill: 3 seconds  Skin: chronic lower extremity skin changes with wounds   Neurologic: grossly normal    Laboratory:  LABS:  Recent Labs     10/24/18  0111 10/25/18  0304 10/26/18  0405   NA 139 143 141   K 3.4* 3.5 4.0   CL 107 110 108   CO2 19* 21 22   GAP 13* 12 11   BUN 69* 65* 69*   CR 2.10* 1.86* 2.02*   GLU 302* 96 279*   CA 7.3* 7.5* 7.5*   ALBUMIN 3.0* 3.0* 3.0*   MG 2.3 2.5 2.4   PO4 2.5 2.9 3.7       Recent Labs     10/24/18  0111 10/25/18  0304 10/26/18  0405   WBC 4.5 7.4 7.7   HGB 9.2* 8.8* 10.1*   HCT 26.3* 25.5* 29.3*   PLTCT 164 198 208   AST 19 19 22    ALT 14 15 15  ALKPHOS 44 47 54      Estimated Creatinine Clearance: 41.7 mL/min (A) (based on SCr of 2.02 mg/dL (H)).  Vitals:    10/22/18 1618 10/23/18 0600   Weight: 117.1 kg (258 lb 2.5 oz) 116.6 kg (257 lb 0.9 oz)      No results for input(s): PHART, PO2ART in the last 72 hours.    Invalid input(s): PC02A              Radiology and Other Diagnostic Procedures Review:    Reviewed pertinent studies.

## 2018-10-27 LAB — CBC AND DIFF: Lab: 8.7 K/UL — ABNORMAL LOW (ref >60)

## 2018-10-27 LAB — COMPREHENSIVE METABOLIC PANEL: Lab: 143 MMOL/L — ABNORMAL LOW (ref 137–147)

## 2018-10-27 LAB — LDH-LACTATE DEHYDROGENASE: Lab: 440 U/L — ABNORMAL HIGH (ref 100–210)

## 2018-10-27 LAB — POC GLUCOSE
Lab: 367 mg/dL — ABNORMAL HIGH (ref 70–100)
Lab: 240 mg/dL — ABNORMAL HIGH (ref 70–100)
Lab: 449 mg/dL — ABNORMAL HIGH (ref 70–100)
Lab: 398 mg/dL — ABNORMAL HIGH (ref 70–100)

## 2018-10-27 LAB — PHOSPHORUS: Lab: 3.9 mg/dL — ABNORMAL HIGH (ref >60)

## 2018-10-27 LAB — MAGNESIUM: Lab: 2.6 mg/dL — ABNORMAL HIGH (ref 1.6–2.6)

## 2018-10-27 LAB — FERRITIN: Lab: 655 ng/mL — ABNORMAL HIGH (ref 30–300)

## 2018-10-27 LAB — C REACTIVE PROTEIN (CRP): Lab: 8.5 mg/dL — ABNORMAL HIGH (ref <1.0)

## 2018-10-27 LAB — D-DIMER: Lab: 106 ng{FEU}/mL — ABNORMAL HIGH (ref <500)

## 2018-10-27 MED ORDER — BUDESONIDE-FORMOTEROL 160-4.5 MCG/ACTUATION IN HFAA
2 | Freq: Two times a day (BID) | RESPIRATORY_TRACT | 0 refills | Status: DC
Start: 2018-10-27 — End: 2018-11-01
  Administered 2018-10-27 – 2018-11-01 (×2): 2 via RESPIRATORY_TRACT

## 2018-10-27 MED ORDER — INSULIN ASPART 100 UNIT/ML SC FLEXPEN
12 [IU] | Freq: Three times a day (TID) | SUBCUTANEOUS | 0 refills | Status: DC
Start: 2018-10-27 — End: 2018-10-28

## 2018-10-27 MED ORDER — NPH INSULIN HUMAN RECOMB 100 UNIT/ML (3 ML) SC PEN
20 [IU] | Freq: Three times a day (TID) | SUBCUTANEOUS | 0 refills | Status: DC
Start: 2018-10-27 — End: 2018-10-28

## 2018-10-27 NOTE — Consults
Wound Ostomy Note    NAME:Paul Benjamin                                                                   MRN: [id number]                 DOB:December 25, 1945          AGE: 73 y.o.  ADMISSION DATE: 10/22/2018             DAYS ADMITTED: LOS: 5 days      Reason for Consult/Visit: wound not pressure    Assessment/Plan:    Principal Problem:    Pneumonia due to COVID-19 virus  Active Problems:    CAD (coronary artery disease)    Hypertension    Mixed dyslipidemia    Sleep apnea    PLMD (periodic limb movement disorder)    Chronic heart failure with preserved ejection fraction (HFpEF) (HCC)    Acute respiratory failure with hypoxia (HCC)    Hyperglycemia        Wounds (NOT for Pressure Injuries) 10/22/18 1647 Left;Lower Leg Blister (Active)   10/22/18 1647 Leg   Wound Orientation: Left;Lower   Wound Type: Blister   Wound Type::    Wound Description (Comments):    Wound Image   10/27/2018 12:00 PM   Wound Base Assessment Moist;Pink 10/27/2018 12:00 PM   Surrounding Skin Assessment Intact 10/27/2018 12:00 PM   Wound Drainage Amount Small 10/27/2018 12:00 PM   Wound Drainage Description Serous 10/27/2018 12:00 PM   Wound Dressing Status None 10/27/2018  8:45 AM   Wound Length (cm) 2.5 cm 10/27/2018 12:00 PM   Wound Width (cm) 2.5 cm 10/27/2018 12:00 PM   Wound Depth (cm) 0.1 cm 10/27/2018 12:00 PM   Wound Surface Area (cm^2) 6.25 cm^2 10/27/2018 12:00 PM   Wound Volume (cm^3) 0.62 cm^3 10/27/2018 12:00 PM   Number of days: 57     73 yo M with a PMHx significant for HFpEF, CAD (status post DES to LAD 10/28/2005B), Essential HTN, Paroxysmal Atrial Fibrillation, SSS/Tach-Brady Syndrome status post PPM (7/74/2008), Dyslipidemia, Sleep Apnea, Hypothyroidism and T2DM (insulin dependent) transferred form OSH on 7/29 for worsening Acute Hypoxic Respiratory Failure secondary to CoVID-19    Wound team consulted for wound not pressure-previous left lower leg cellulitis wound    Left medial LE with unroofed blister- Wound cleaned .  pink and moist wound base- peri wound intact slightly macerated.  small serous drainage    Pt reports he does not conotintely keep his legs elevated.     Dressing applied- please change in 3 days    RECOMMEND:     LLE- clean with saline and gauze- cover wound base with Silver contact layer and foam dressing.  Change q 72 hours-  (Next due 8/6)      ---Wound care and maintenance orders placed per skin integrity protocol.      Wound team will sign off    Berton Mount RN, BSN, [facility]  Wound Ostomy Nursing Consult Service  Office:  [phone]  Pager:  [phone]  Hyperbaric Nursing  Unit: [phone]

## 2018-10-27 NOTE — Case Management (ED)
Case Management Progress Note    NAME:Paul Benjamin                          MRN: [id number]              DOB:[birth year]          AGE: 73 y.o.  ADMISSION DATE: 10/22/2018             DAYS ADMITTED: LOS: 5 days      Todays Date: 10/27/2018    Plan: Will continue to follow as condition evolves.    Interventions  ? Support  Palliative care team following for assistance in communication.  SW phoned pt's wife Vicente Males and Anice Paganini to discuss whether they would like to have Zoom call with pt this week.  They reported they have been able to do some video conversations with pt since he is able to use his cell phone.  They identified that it would be helpful to coordinate a Zoom call with a number of family on it all at once. They will discuss as a family what day would work for them and get back to this SW.    ? Info or Referral      ? Discharge Planning      ? Medication Needs                                                 ? Financial      ? Legal      ? Other        Disposition  ? Expected Discharge Date    Expected Discharge Date: 10/31/18  Expected Discharge Time: 1500  ? Transportation   Does the patient need discharge transport arranged?: No  Transportation Name, Phone and Availability #1: Wife will transport   ? Next Level of Care (Acute Psych discharges only)      ? Discharge Disposition                                          Durable Medical Equipment      No service has been selected for the patient.      [place] [facility]      No service has been selected for the patient.      [place]      No service has been selected for the patient.      [facility]      No service has been selected for the patient.        Shon Hale, LMSW  Phone: [phone]

## 2018-10-27 NOTE — Progress Notes
PHYSICAL THERAPY  PROGRESS NOTE        Name: Paul Benjamin        MRN: [id number]          DOB: 1946-01-13          Age: 73 y.o.  Admission Date: 10/22/2018             LOS: 5 days      Mobility  Progressive Mobility Level: Walk in room  Distance Walked (feet): 50 ft  Level of Assistance: Stand by assistance  Assistive Device: Walker  Time Tolerated: 11-30 minutes  Activity Limited By: Shortness of air    Subjective  Significant hospital events: PMH significant for HTN, HLD, chronic HFpEF, DM, hypothyroidism, and restless leg syndrome who presented to OSH with AMS and hypoglycemia; found to have COVID-19. Overnight 7/28-7/29, pt became febrile with increasing oxygen requirement (O2 sat low 90s on 9 L/NC). Transferred to [facility] for worsening acute hypoxic respiratory failure in the setting of COVID-19 pneumonia. Requiring comfort flow.   Mental / Cognitive Status: Alert;Oriented;Cooperative;Follows Commands  Pain: Patient has no complaint of pain  Comments: 6L 02 NC  Ambulation Assist: Independent Mobility in [employer] without Device  Patient Owned Equipment: Single Journalist, newspaper  Home Situation: Lives with Family(wife, uses cane)  Type of Home: House  Entry Stairs: 6-10 Stairs;Rail on 1 Side(10)  In-Home Stairs: 1-2 Flights of Stairs;Rail on 1 Side(18 steps to bedroom)     Bed Mobility/Transfer  Bed Mobility: Supine to Sit: Standby Assist;Head of Bed Elevated  Transfer Type: Sit to Stand  Transfer: Assistance Level: From;Bed;Minimal Assist  Transfers: Type Of Assistance: Verbal Cues;For Strength Deficit  End Of Activity Status: Up in Chair;Nursing Notified;Instructed Patient to Request Assist with Mobility;Instructed Patient to Use Call Light    Gait  Gait Distance: (40'x2 with seated rest then 15' )  Gait: Assistance Level: Standby Assist;Management of Lines  Gait: Assistive Device: Technical brewer: Descriptors: Forward trunk flexion;Pace: Slow;Swing-Through Gait;No balance loss;Normal step length Activity Limited By: SOA;Complaint of Fatigue  Comments: Ambulated bed to door and back twice.  Desats to as low as 79% on first walk.  However second walk to door and back twice patient sats>90% so Sp02 signal may have not been accurate during first bout of ambulation.  Patient reports moderate distress.      Assessment/Progress  Impaired Mobility Due To: Decreased Activity Tolerance;Decreased Strength;Deconditioning;Medical Status Limitation  Assessment/Progress: Improving as Expected    AM-PAC 6 Clicks Basic Mobility Inpatient  Turning from your back to your side while in a flat bed without using bed rails: A Little  Moving from lying on your back to sitting on the side of a flatbed without using bedrails : A Little  Moving to and from a bed to a chair (including a wheelchair): A Little  Standing up from a chair using your arms (e.g. wheelchair, or bedside chair): A Little  To walk in hospital room: A Little  Climbing 3-5 steps with a railing: A Little  Raw Score: 18  Standardized (T-scale) Score: 41.05  Basic Mobility CMS 0-100%: 40.47  CMS G Code Modifier for Basic Mobility: CK    Goals  Goal Formulation: With Patient  Time For Goal Achievement: 5 days, To, 7 days  Patient Will Go Supine To Sit: Independently  Patient Will Go Sit to Supine : Independently  Patient Will Transfer Bed/Chair: w/ Stand By Assist  Patient Will Transfer Sit to Stand: w/ Stand By Assist  Patient Will Ambulate: 101-150 Feet, w/ Dan Humphreys, w/ Stand By Assist  Patient Will Go Up / Down Stairs: 6-10 Stairs, w/ Stand By Assist    Plan  Treatment Interventions: Mobility Training;Strengthening  Plan Frequency: 3-5 Days per Week  PT Plan for Next Visit: Ambulate in room as tolerated; monitor Sp02 and titrate 02 as needed.  Stairs when ready    PT Discharge Recommendations  Recommendation: Currently patient requires inpatient level of care. However, typical progression for patient condition would anticipate home with assistance at time of discharge.  Patient Currently Requires Physical Assist With: Stairs;Transfers;In and out of house  Comments: Patient generally weak but improving and would benefit from 48 hours in hospital to wean 02 and continue to increase activity level.  If continues to improve anticipate to be able to discharge home with family assist.     Therapist: Ernestine Mcmurray, PT  Date: 10/27/2018

## 2018-10-27 NOTE — Progress Notes
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 Patient swabbed CoVID-19 positive on 7/26 and subsequently hospitalized at OSH. Transferred to [facility] on 7/29.    MICU Course (7/29-8/2)  ??? Paul Benjamin is a 73 year old male who presented from an outside hospital with altered mental status and hypoglycemia.  Patient was found to be COVID-19 positive on 10/19/2018.  At outside hospital, patient received 5 days course of vancomycin and Zosyn (not continued on admission here at [facility]).  Patient was transferred for worsening acute hypoxic respiratory failure requiring comfort flow.  Patient started on Decadron 6 mg daily (to end 8/7).  Patient was also started on Remdesevir on 7/29 which was completed 8/2.  ID was consulted and decision was made to defer starting Tocilizumab at this time. Additionally, patient was also started on doxycycline 100 mg twice daily to end 10/27/2018 given elevated procal.  Patient also currently on increased prophylactic dose of Lovenox per COVID-19 protocol given patient's elevated d-dimer.   ??? Patient blood pressures were difficult to control in the setting of dexamethasone.  Briefly required insulin drip to control blood glucose levels and subsequently transition to subcu insulin.  Patient was no longer requiring insulin drip on the day of transfer to the floor. ??? Oxygen requirements the day of transfer to the floor, 4 L of O2 via NC.    Current Assessment:   ??? Acute Hypoxic Respiratory Failure secondary to CoVID-19 PNA  ??? CoVID Inflammatory labs/markers:  ??? D-Dimer: 1,038 (admission) --> 876 --> 1,001 --> 974 -->738--> 1,060  ??? LDH: 346 (on admission)-->396 --> 464 --> 440  ??? Ferritin: 511 (on admission) --> 701 -->655  ??? CRP 27.38 (admission) --> 11.34 --> 8.38  ??? IL6 on admission: 137.5  ??? Procalcitonin on admission: 1.54; Urine strep/legionella, MRSA PNA negative   ??? Blood Cx on admission: NGTD   ??? ABG on admission: 7.46/30/94  ??? CXR on admission: Increasing patchy right greater than left mid and lower lung zone opacity concerning for progression of multifocal pneumonia.  ??? CXR (10/24/2018): No significant change of patchy bilateral pulmonary opacities  ??? CoVID-19 regimen during hospitalization:  ??? Convalescent Plasma (7/30, 7/31)  ??? Doxycycline (8/1-current w/ expectation to complete a 5 day course)  ??? Remdesivir (7/29-8/2)  ??? Dexamethasone (7/29-8/7)  ??? Lovenox 30 mg twice daily  ??? Respimat 4 times daily  ??? Symbicort twice daily   ??? Encouraging prone position, however, it appears due to discomfort is not doing it   ??? Diarrhea secondary to CoVID-19 I improving   ??? HFpEF I stable, nothing to suggest acute decompensation on admission  ??? Essential HTN  ??? Follows with [place] HF specialist, Dr Pierre Benjamin (last seen in clinic on 08/28/2018)  ??? Goal Dry  weight:267 lbs  ??? BNP on admission: 67  ??? Prior to arrival regimen: Carvedilol 25 mg twice daily, amlodipine 10 mg daily, chlorthalidone 25 mg daily, furosemide 80 mg every other day (to increase to twice daily if weight is greater than 272 pounds)  ??? CAD (status post DES to LAD 01/21/2004)  ??? Paroxysmal Atrial Fibrillation  ??? (05/2007) sotalol initiation and cardioversion  ??? (09/23/2007) status post ablation  ??? Prior to arrival regimen: Carvedilol 25 mg twice daily (continued on admission)  ??? Not on [facility] prior to admission ??? 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 Dyslipidemia  ??? Prior to arrival regimen: Zocor 20 mg nightly (continued on admission)  ??? Sleep Apnea/Periodic Limb Movement Disorder  ??? Prior to arrival regimen: Mirapex 1.5 mg nightly   ??? Prior to arrival CPAP/BiPAP  ??? Hypothyroidism  ??? Prior to arrival regimen: Levothyroxine 75 mcg daily (continued on admission)  ??? T2DM (insulin dependent  ??? Prior to arrival regimen: Levemir 30 units twice daily, aspart 8 units 3 times daily with meals, glimepiride 4 mg twice daily with meals  ??? Current regimen: NPH 30 units twice daily (increased from 25 U on 8/3), aspart 12 units 3 times daily with meals (increased from 10 U ), MDCF  ??? Considering switching to every 8 hour dosing   ??? 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 Baseline Cr ~ 1.8-2  ??? Debility/Deconditioning  ??? Secondary to CoVID-19 illness    Labs/Imaging/Medications/Consultant recommendations reviewed (if applciable)    Current Plan:   ???Acute Hypoxic Respiratory Failure secondary to CoVID-19 PNA  ??? Will obtain CoVID inflammatory markers twice a week (unless clinical status abruptly changes)  ??? Continue dexamethasone IV 6 mg daily expectation to complete a 10-day course   ??? Continue to wean O2 as tolerated  ??? Respimat 4 times daily and Symbicort twice daily   ??? Mucinex 600 mg twice daily   ??? Continue Lovenox 30 mg twice daily  ???Debility/deconditioning  ??? PT/OT        FEN: no IVF, electrolytes reviewed, DIET CARDIAC (LOW FAT/LOW NA) & DIABETIC   Prophalyxis:  Lovenox  Disposition: Med Private A - 2777  Code status: Full Code (Discussed on 10/27/2018)        Ezzard Flax, MD       10/27/2018  Internal Medicine         Voalte  Ezzard Flax, MD  or page:  [phone]    Subjective     In brief, had the pleasure meeting with and evaluating/examining Paul Benjamin's am.  I discussed the above assessment/plan and attempt answer questions best my clinical knowledge.  Indicated he understands and is agreeable to the above assessment/plan.    Patient without acute/new concerns.    Otherwise, ROS described below    Review of Systems   A comprehensive 14-point ROS  was negative except for:      Review of Systems   Constitutional: Positive for malaise/fatigue. Negative for chills, diaphoresis, fever and weight loss.   HENT: Negative for congestion, ear discharge, ear pain, hearing loss, nosebleeds, sinus pain, sore throat and tinnitus.    Eyes: Negative for blurred vision, double vision, photophobia, pain, discharge and redness.   Respiratory: Positive for cough, sputum production and shortness of breath. Negative for hemoptysis, wheezing and stridor.    Cardiovascular: Negative for chest pain, palpitations, orthopnea, claudication, leg swelling and PND.   Gastrointestinal: Negative for abdominal pain, blood in  stool, constipation, diarrhea, heartburn, melena, nausea and vomiting.   Genitourinary: Negative for dysuria, flank pain, frequency, hematuria and urgency.   Musculoskeletal: Negative for back pain, falls, joint pain, myalgias and neck pain.   Skin: Negative for itching and rash.   Neurological: Negative for dizziness, tingling, tremors, sensory change, speech change, focal weakness, seizures, loss of consciousness, weakness and headaches.   Endo/Heme/Allergies: Negative for polydipsia.       Past medical history    has a past medical history of Atrial fibrillation (HCC), coronary artery disease, Hypercholesterolemia, Hypertension, Sleep apnea, and SSS (sick sinus syndrome) (HCC).     Past surgical history    has a past surgical history that includes pacemaker placement (09/30/06).       Family history     Family History   Problem Relation Age of Onset   ??? Asthma Father    ??? Cardiovascular Father    ??? Hypertension Mother      Social history     Social History     Socioeconomic History   ??? Marital status: Married     Spouse name: Not on file   ??? Number of children: Not on file ??? Years of education: Not on file   ??? Highest education level: Not on file   Occupational History     Employer: [employer]     Comment: retired   Tobacco Use   ??? Smoking status: Former Smoker     Packs/day: 1.00     Last attempt to quit: 03/26/1966     Years since quitting: 52.6   ??? Smokeless tobacco: Never Used   Substance and Sexual Activity   ??? Alcohol use: No   ??? Drug use: No   ??? Sexual activity: Not on file   Other Topics Concern   ??? Not on file   Social History Narrative   ??? Not on file      Allergies   Lisinopril; Cefuroxime; Clindamycin; Sulfa (sulfonamide antibiotics); Plavix [clopidogrel]; and Quinine    Medications     Current Facility-Administered Medications   Medication   ??? acetaminophen (TYLENOL) tablet 1,000 mg   ??? allopurinoL (ZYLOPRIM) tablet 100 mg   ??? amLODIPine (NORVASC) tablet 10 mg   ??? budesonide-formoterol (SYMBICORT HFA) 160-4.5 mcg/actuation inhalation 2 puff   ??? carvediloL (COREG) tablet 25 mg   ??? chlorthalidone (HYGROTON) tablet 25 mg   ??? dexamethasone (DECADRON) injection 6 mg   ??? doxycycline (VIBRAMYCIN) tablet 100 mg   ??? enoxaparin (LOVENOX) syringe 30 mg   ??? furosemide (LASIX) tablet 80 mg   ??? INHALATIONAL SPACING DEVICE MISC SPCR [employer] Override)   ??? insulin aspart U-100 (NOVOLOG FLEXPEN) injection PEN 0-12 Units   ??? insulin aspart U-100 (NOVOLOG FLEXPEN) injection PEN 10 Units   ??? insulin NPH (HUMULIN N KwikPen) injection PEN 20 Units   ??? ipratropium/albuterol (COMBIVENT RESPIMAT) inhaler 1 puff   ??? levothyroxine (SYNTHROID) tablet 75 mcg   ??? simvastatin (ZOCOR) tablet 20 mg          Physical exam     BP: 149/79 (08/03 0805)  Temp: 36.3 ???C (97.4 ???F) (08/03 0805)  Pulse: 70 (08/03 0918)  Respirations: 20 PER MINUTE (08/03 0913)  SpO2: 95 % (08/03 0918) BP: (129-149)/(62-79)   Temp:  [36.3 ???C (97.3 ???F)-37 ???C (98.6 ???F)]   Pulse:  [69-76]   Respirations:  [20 PER MINUTE-24 PER MINUTE]   SpO2:  [90 %-98 %]   Vitals:    10/22/18 1618 10/23/18  0600 10/27/18 0615 Weight: 117.1 kg (258 lb 2.5 oz) 116.6 kg (257 lb 0.9 oz) 112.4 kg (247 lb 12.8 oz)        General:  Alert and response to question appropriately.  VS as above. No acute distress .     Eyes: No conjunctival injection.  PERRLA.   No scleral icterus.    Ears, nose, mouth, and throat : No erythema.   MMM.   No erythema, exudate noted.    Neck:  Symmetric appearance without crepitus, no obvious mass or noticeable swelling. No JVD.   Lungs:  No use of accessory muscles.  Wheezing throughout. Currently on 6 L via NC  Cardiovascular: Regular rate, S1, S2 normal, no murmurs, clicks, rubs, or gallops appreciated .  2+ and symmetric, all extremities.  No edema in BLE.   Abdomen:  BS+, soft, no guarding or rigidity.  Nontender to palpation without palpable masses.  No rebound tenderness.   Skin: Skin color normal, no obvious evidence of rashes. Chronic bilateral stasis derm noted.   Musculoskeletal:  ROM normal. No joints swelling or erythema.   Neurologic:  Alerted and oriented .    Psych:  Alerted , calm, normal affect    Labs        Recent Labs     10/26/18  0405 10/27/18  0414   NA 141 143   K 4.0 4.2   CL 108 110   CO2 22 22   BUN 69* 68*   CR 2.02* 1.92*   GAP 11 11   MG 2.4 2.6   PO4 3.7 3.9   GFR 32* 34*   ALBUMIN 3.0* 2.9*   TOTBILI 0.4 0.4   AST 22 15   ALT 15 13    Recent Labs     10/26/18  0405 10/27/18  0414   WBC 7.7 8.7   HGB 10.1* 9.6*   HCT 29.3* 27.7*   PLTCT 208 220   MCV 84.0 85.1          Radiology and Other Diagnostic Procedures Review     No results found.

## 2018-10-27 NOTE — Care Plan
Problem: Discharge Planning  Goal: Participation in plan of care  Outcome: Goal Ongoing  Goal: Knowledge regarding plan of care  Outcome: Goal Ongoing     Problem: Infection, Risk of  Goal: Absence of infection  Outcome: Goal Ongoing  Goal: Knowledge of Infection Control Procedures  Outcome: Goal Ongoing     Problem: Skin Integrity  Goal: Skin integrity intact  Outcome: Goal Ongoing     Problem: Falls, High Risk of  Goal: Absence of falls-Adult Patient  Outcome: Goal Ongoing

## 2018-10-27 NOTE — Progress Notes
PALLIATIVE CARE INPATIENT NOTE     Name: Paul Benjamin            MRN: [id number]                DOB: [birth year]          Age: 73 y.o.  Admission Date: 10/22/2018             LOS: 5 days    ASSESSMENT     Paul Benjamin is a 73 y.o. male with SOA, respiratory distress, and AMS transferred from [facility] 7/29  for further evaluation of hypoxic respiratory failure and COVID-19 PNA. Palliative Care has been asked to assist with family communication.     Advance Care Planning:   Code Status: Full Code  Identified Health Care Decision Maker:  Paul Benjamin, wife    Prognosis (Estimated):  Based on current function, current medical issues, goals of care, and prognosis models, my estimated prognosis is Unknown.  Potential Disposition: Too soon to determine      PLAN       #COVID19 PNA: improving  #Hypoxemic respiratory failure: improving  #Chronic heart failure: stable      Discussion:   I met with patient along with SW Geraldine Contras and APP Chisholm today. Patient feels improved overall. He feels like he's received good communication from his hospitalist physician. He has no additional concerns today. He's been communicating with his family by phone.    RECOMMENDATIONS:  ? Symptom are well addressed at present.  ? We will help facilitate communication with patient, his family and medical team.      Obtained patient's or surrogate decision maker's verbal consent to treat them and their agreement to [facility] financial policy and NPP via this telehealth visit during the [facility] Emergency.  I met with patient and/or patient's surrogate decision maker via Zoom audio-visual virtual encounter today due to the need to minimize patient contact during COVID-19 pandemic. I was assisted by APP Jimmey Ralph to facilitate the encounter.  I spent 25 minutes providing care to patient today with >50% for coordination of care including discussion with patient about plan of care.    SUBJECTIVE     CC/Reason for Visit: symptom check History of Present Illness: patient reports that he's feeling improved overall. He feels SOB but improved since admission. He denies pain, nausea, vomiting, constipation.       OBJECTIVE     Blood pressure (!) 149/79, pulse 78, temperature 36.3 ???C (97.4 ???F), height 180.3 cm (70.98), weight 112.4 kg (247 lb 12.8 oz), SpO2 96 %.  Physical Exam  Sitting in bed, in NAD  Non-labored breathing pattern  Conversant, Paul, calm      Current PPS%: Palliative Performance Scale (PPS): 60     Lab Results:  CBC   Lab Results   Component Value Date/Time    WBC 8.7 10/27/2018 04:14 AM    HGB 9.6 (L) 10/27/2018 04:14 AM    PLTCT 220 10/27/2018 04:14 AM     Lab Results   Component Value Date/Time    NEUT 92 (H) 10/27/2018 04:14 AM    ANC 8.04 (H) 10/27/2018 04:14 AM      Chemistries   Lab Results   Component Value Date/Time    NA 143 10/27/2018 04:14 AM    K 4.2 10/27/2018 04:14 AM    BUN 68 (H) 10/27/2018 04:14 AM    CR 1.92 (H) 10/27/2018 04:14 AM    GLU 238 (H) 10/27/2018 04:14  AM    GLU 113 (H) 01/06/2016 03:46 PM     Lab Results   Component Value Date/Time    CA 7.7 (L) 10/27/2018 04:14 AM    PO4 3.9 10/27/2018 04:14 AM    ALBUMIN 2.9 (L) 10/27/2018 04:14 AM    TOTPROT 6.1 10/27/2018 04:14 AM    ALKPHOS 50 10/27/2018 04:14 AM    AST 15 10/27/2018 04:14 AM    ALT 13 10/27/2018 04:14 AM    TOTBILI 0.4 10/27/2018 04:14 AM    GFR 34 (L) 10/27/2018 04:14 AM    GFRAA 42 (L) 10/27/2018 04:14 AM        Other Pertinent Diagnostic Results:   NA

## 2018-10-27 NOTE — Progress Notes
CLINICAL NUTRITION                                                        Clinical Nutrition Initial Assessment    Name: Paul Benjamin        MRN: [id number]          DOB: [birth year]          Age: 73 y.o.  Admission Date: 10/22/2018             LOS: 5 days    Recommendation:  ??? Continue current diet as ordered.   ??? Encourage lean protein w/ each meal, low CHO vegetables, moderation of starchy foods to aid blood sugar regulation. Also encourage adequate hydration, nutrition supplements during times of low appetite & protein w/ each meal.   ??? REC to check 25-OH vit D at some point to r/o deficiency; replace if <30ng/mL.    Comments:  73 year old male who presented from an outside hospital with altered mental status and hypoglycemia, found to be COVID-19 positive on 10/19/2018 & was transferred for worsening acute hypoxic respiratory failure requiring comfort flow.  Patient started on Decadron 6 mg daily (to end 8/7). On Remdesevir on 7/29 - 8/2, currently on increased prophylactic dose of Lovenox per COVID-19 protocol given patient's elevated d-dimer. Patient blood sugars were difficult to control in the setting of dexamethasone, briefly required insulin drip to control blood glucose levels and subsequently transition to subcu insulin.  Patient was no longer requiring insulin drip on the day of transfer to the floor. Oxygen requirements the day of transfer to the floor, 4 L of O2 via NC. Currently on HFNC of 6lpm this AM. Diarrhea secondary to CoVID-19 - now improving.     MST score of 0. Weight stable over the past year though down in the past 6 months, likely due to fluid fluctuations.  Goal Dry weight - 267#. BLE pitting 1+ edema. Spoke w/ patient today via telephone due to COVID precautions, he reported appetite has been fair recently, reports hospital food is causing poor appetite if poor appetite does occur, has been consuming ~2-3 meals per day. Reports no specific diet recommendations at home, though he tries to avoid foods that are high in sugar. Reports he consumes a moderate amount of sugar. Reports taking a vitamin C and Vitamin D daily. No allergies to foods per his report. Attempted to provide COVID 19 nutrition education regarding importance of adequate hydration, protein consumption, supplementation w/ MVI as consuming 3 meals per day/avoiding skipping meals, though visit cut short due to patient needing to be seen by other providers and receiving additional phone call. Patient is not at acute nutrition risk at this time, will continue to monitor per protocol.     Nutrition Assessment of Patient:  BMI (Calculated): 36.02;  ;    24hour FSBS of 240-367mg /dl. A1c of 8.2% 06/02/2018. BUN/Creat elevated. ALb 2.9, Ca 7.7. No hx of vitamin D checks. LDH elevated. MDCF, synthroid, steriods on board. Decadron last dose planned for 8/8. +BM 10/26/2018.   Pertinent Allergies/Intolerances: None per patient report                      Malnutrition Assessment:   Does not meet criteria      Konrad Saha, MS, RD, LD, CNSC  Voalte: [phone] (Preferred Communication Method)  Pager: [phone]*

## 2018-10-28 LAB — COMPREHENSIVE METABOLIC PANEL: Lab: 142 MMOL/L — ABNORMAL LOW (ref 137–147)

## 2018-10-28 LAB — CULTURE-BLOOD W/SENSITIVITY

## 2018-10-28 LAB — POC GLUCOSE
Lab: 311 mg/dL — ABNORMAL HIGH (ref 70–100)
Lab: 239 mg/dL — ABNORMAL HIGH (ref 70–100)
Lab: 378 mg/dL — ABNORMAL HIGH (ref 70–100)
Lab: 337 mg/dL — ABNORMAL HIGH (ref 70–100)

## 2018-10-28 LAB — 25-OH VITAMIN D (D2 + D3): Lab: 29 ng/mL — ABNORMAL LOW (ref 30–80)

## 2018-10-28 LAB — PHOSPHORUS: Lab: 4.2 mg/dL — ABNORMAL HIGH (ref 2.0–4.5)

## 2018-10-28 LAB — MAGNESIUM: Lab: 2.5 mg/dL — ABNORMAL LOW (ref 1.6–2.6)

## 2018-10-28 LAB — CBC AND DIFF: Lab: 9.1 K/UL — ABNORMAL HIGH (ref >60)

## 2018-10-28 MED ORDER — CHOLECALCIFEROL (VITAMIN D3) 25 MCG (1,000 UNIT) PO TAB
1000 [IU] | Freq: Two times a day (BID) | ORAL | 0 refills | Status: DC
Start: 2018-10-28 — End: 2018-10-29
  Administered 2018-10-29 (×2): 1000 [IU] via ORAL

## 2018-10-28 MED ORDER — ERGOCALCIFEROL (VITAMIN D2) 1,250 MCG (50,000 UNIT) PO CAP
50000 [IU] | ORAL | 0 refills | Status: DC
Start: 2018-10-28 — End: 2018-11-01
  Administered 2018-10-29: 13:00:00 50000 [IU] via ORAL

## 2018-10-28 MED ORDER — NPH INSULIN HUMAN RECOMB 100 UNIT/ML (3 ML) SC PEN
30 [IU] | Freq: Three times a day (TID) | SUBCUTANEOUS | 0 refills | Status: DC
Start: 2018-10-28 — End: 2018-10-30
  Administered 2018-10-30: 02:00:00 30 [IU] via SUBCUTANEOUS

## 2018-10-28 MED ORDER — INSULIN ASPART 100 UNIT/ML SC FLEXPEN
14 [IU] | Freq: Three times a day (TID) | SUBCUTANEOUS | 0 refills | Status: DC
Start: 2018-10-28 — End: 2018-10-29

## 2018-10-28 NOTE — Care Plan
Problem: Discharge Planning  Goal: Participation in plan of care  Outcome: Goal Ongoing  Flowsheets (Taken 10/28/2018 1253)  Participation in Plan of Care: Involve patient/caregiver in care planning decision making  Goal: Knowledge regarding plan of care  Outcome: Goal Ongoing  Flowsheets (Taken 10/28/2018 1253)  Knowledge regarding plan of care:   Provide admission education to parent/caregiver   Provide procedural and treatment education   Provide infection prevention education   Provide VTE signs and symptoms education   Provide plan of care education   Provide fall prevention education   Provide medication management education  Goal: Prepared for discharge  Outcome: Goal Ongoing  Flowsheets (Taken 10/28/2018 1253)  Prepared for discharge:   Collaborate with multidisciplinary team for hospital discharge coordination   Complete ADL ability assessment   Provide safe use medical equipment education   Provide diet and oral health education   Provide discharge activity restrictions education   Provide discharge materials appropriate to patient condition     Problem: Infection, Risk of  Goal: Absence of infection  Outcome: Goal Ongoing  Flowsheets (Taken 10/28/2018 1253)  Absence of infection:   Assess for infection (Monitor SIRS Criteria)   Administer pharmacological therapies as ordered   Implement prevention measures as indicated   Monitor for signs and symptoms of infection  Goal: Knowledge of Infection Control Procedures  Outcome: Goal Ongoing  Flowsheets (Taken 10/28/2018 1253)  Knowledge of Infection Control procedures: Provide Isolation Precautions Education     Problem: Skin Integrity  Goal: Skin integrity intact  Outcome: Goal Ongoing  Flowsheets (Taken 10/28/2018 1253)  Skin integrity intact:   Assess nutrition   Promote nutrition   Assure position change   Monitor skin integrity   Reduce skin shear, friction and tissue load   Provide skin care interventions   Provide incontinence management interventions Provide skin self-assessment education  Goal: Healing of skin (Wound & Incision)  Outcome: Goal Ongoing  Flowsheets (Taken 10/28/2018 1253)  Healing of wound (wounds and Incisions):   Assess for signs and symptoms of wound infection   Assess wound site healing   Implement wound/incision care as ordered   Provide wound/incision care as ordered  Goal: Healing of skin (Pressure Injury)  Outcome: Goal Ongoing  Flowsheets (Taken 10/28/2018 1253)  Healing of skin (Pressure Injury):   Assess for signs and symptoms of pressure injury infection   Use the National Pressure Injury Advisory Panel Staging System for grading pressure injuries   Assess pressure injury   Implement pressure injury care as ordered   Implement pressure relieving device/specialty mattress   Promote ambulation   Provide pressure injury care education as ordered     Problem: Falls, High Risk of  Goal: Absence of falls-Adult Patient  Outcome: Goal Ongoing  Flowsheets (Taken 10/28/2018 1253)  Absence of falls-Adult Patient:   Complete Fall Risk Assessment.   Provde safe environment.   Provide safe ambulation.   Implement fall risk bundle.   Provide fall prevention strategies.     Problem: Self-Care Deficit  Goal: Maximize ADL functioning  Outcome: Goal Ongoing     Problem: Mobility/Activity Intolerance  Goal: Maximize functional ADL's and mobility outcomes  Outcome: Goal Ongoing  Flowsheets (Taken 10/28/2018 1253)  Maximize functional ADLs and mobility outcomes:   Administer oxygen to maintain SpO2 at approprate levels   Maintain body position   Manage environmental safety   Consider consult for mobility/activity intolerance   Manage energy conservation for mobility/activity intolerance     Problem: Glucose Management  Goal: Absence of hyperglycemia  Outcome: Goal Ongoing  Flowsheets (Taken 10/28/2018 1253)  Absence of hyperglycemia:   Assess for sign and symptoms of hyperglycemia   Administer pharmacological therapies as ordered Provide causes of hyperglycemia education   Provide diabetes medication management education  Goal: Absence of Hypoglycemia  Outcome: Goal Ongoing  Flowsheets (Taken 10/28/2018 1253)  Absence of hypoglycemia:   Assess for signs and symptoms of hypoglycemia   Consult to Diabetes Educator   Treat hypoglycemia symptoms per standard   Provide causes of hypoglycemia education  Goal: Glucose level within specified parameters  Outcome: Goal Ongoing  Flowsheets (Taken 10/28/2018 1253)  Glucose level within specified parameters: Perform blood glucose monitoring as ordered and/or patient is symptomatic

## 2018-10-28 NOTE — Care Coordination-Inpatient
Brief Palliative Note:   Pt continuing to improve. Symptoms controlled and goals of care established. Family has good communication via cell phone. Pt did not identify any other needs/concerns at visit yesterday.    Palliative Care will plan to sign off - if any other needs/questions arise, please page the triage pager number: [phone]. We appreciate the consultation and participating in the care of this pt.

## 2018-10-28 NOTE — Care Plan
Problem: Discharge Planning  Goal: Participation in plan of care  Outcome: Goal Ongoing  Goal: Knowledge regarding plan of care  Outcome: Goal Ongoing  Goal: Prepared for discharge  Outcome: Goal Ongoing     Problem: Infection, Risk of  Goal: Absence of infection  Outcome: Goal Ongoing  Goal: Knowledge of Infection Control Procedures  Outcome: Goal Ongoing     Problem: Skin Integrity  Goal: Skin integrity intact  Outcome: Goal Ongoing  Goal: Healing of skin (Wound & Incision)  Outcome: Goal Ongoing  Goal: Healing of skin (Pressure Injury)  Outcome: Goal Ongoing     Problem: Falls, High Risk of  Goal: Absence of falls-Adult Patient  Outcome: Goal Ongoing  Goal: Absence of Falls-Pediatric patient  Outcome: Goal Ongoing     Problem: Self-Care Deficit  Goal: Maximize ADL functioning  Outcome: Goal Ongoing     Problem: Mobility/Activity Intolerance  Goal: Maximize functional ADL's and mobility outcomes  Outcome: Goal Ongoing

## 2018-10-28 NOTE — Progress Notes
OCCUPATIONAL THERAPY  PROGRESS NOTE      Name: Paul Benjamin        MRN: [id number]          DOB: 03-04-1946          Age: 73 y.o.  Admission Date: 10/22/2018             LOS: 6 days      Mobility  Patient Turn/Position: Chair  Progressive Mobility Level: Walk in room  Distance Walked (feet): 20 ft  Level of Assistance: Stand by assistance  Assistive Device: Walker  Time Tolerated: 11-30 minutes  Activity Limited By: Shortness of air;Fatigue    Subjective  Pertinent Dx per Physician: Paul Benjamin is a 73 y/o male with PMH significant for HTN, HLD, chronic HFpEF, DM, hypothyroidism, and restless leg syndrome who presented to OSH with AMS and hypoglycemia; found to have COVID-19. Overnight 7/28-7/29, pt became febrile with increasing oxygen requirement (O2 sat low 90s on 9 L/NC). Transferred to [facility] for worsening acute hypoxic respiratory failure in the setting of COVID-19 pneumonia. Requiring comfort flow.   Precautions: Falls;Isolation;O2 Requirement; 4L NC  Pain / Complaints: Patient agrees to participate in therapy;Patient has no c/o pain    Objective  Psychosocial Status: Willing and Cooperative to Participate    Home Living  Type of Home: House  Home Layout: Two Level;Bed/Bath Upstairs(8 steps to enter; 18 steps to bed/bath)  Financial risk analyst / Tub: Tub/Shower Unit  [employer] Equipment: Buyer, retail: Walker;Cane;Wheelchair-manual    Prior Function  Level Of Independence: Independent with ADLs and functional transfers;Independent with homemaking w/ ambulation  Lives With: Spouse  Other Function Comments: Pt has 5 children and 3 of the children would be able to assist if needed PRN at d/c     ADL's  Where Assessed: Chair;Standing at [facility];In [employer] Assist: Stand By Assist  Grooming Deficits: Supervision/Safety;Standing With Education administrator;Wash/Dry Hands  Toileting Assist: Stand By Assist  Toileting Deficits: Supervision/Safety;Grab Bar Use Comment: Pt required verbal cues to identify that his brief was soiled. Doffed soiled briefs and donned new briefs with SBA for setup.     ADL Mobility  Transfer Type: Sit to/from stand  Transfer: Assistance Level: To/from;Bedside chair;Toilet;Verbal cues  Transfer: Assistive Device: Agricultural consultant: Type of Assistance: For balance;For strength deficit  End of Activity Status: Up in chair;Instructed patient to use call light  Sitting Balance: Static sitting balance;Independent  Standing Balance: Static standing balance;Standby assist;2 UE support(with RW)  Gait Distance: 20 feet  Gait: Assistance Level: Standby assist  Gait: Assistive Device: Roller walker    Activity Tolerance  Endurance: 2/5 Tolerates 10-20 Minutes Exercise w/Multiple Rests  Comment: 02 saturation at 94% on 4L 02 at rest. Notable desaturation to low 80's upon return to bedside, requiring 2-3 min seated rest break with pursed lip breathing for saturations to improve into mid 90's.    Assessment  Assessment: Decreased ADL Status;Decreased Endurance;Decreased Self-Care Trans;Decreased High-Level ADLs  Prognosis: Good;w/Cont OT s/p Acute Discharge  Goal Formulation: Patient    AM-PAC 6 Clicks Daily Activity Inpatient  Putting on and taking off regular lower body clothes?: Total  Bathing (Including washing, rinsing, drying): A Lot  Toileting, which includes using toilet, bedpan, or urinal: Total  Putting on and taking off regular upper body clothing: A Little  Taking care of personal grooming such as brushing teeth: None  Eating meals?: None  Daily Activity Raw Score: 15  Standardized (t-scale) score: 34.69  CMS  0-100% Score: 56.46  CMS G Code Modifier: CK    Plan  Progress: Progressing Toward Goals  OT Frequency: 2-3x/week  OT Plan for Next Visit: Standing endurance, don/doff socks    ADL Goals  Patient Will Perform All ADL's: w/ Stand By Assist    Functional Transfer Goals  Pt Will Perform All Functional Transfers: w/ Stand By Assist OT Discharge Recommendations  Recommendation: Currently patient requires inpatient level of care. However, typical progression for patient condition would anticipate home with assistance at time of discharge.  Patient Currently Requires Physical Assist With: All mobility;All personal care ADLs;All home functioning ADLs      Therapist: Lenox Ponds, [place] R/L [id number]   Date: 10/28/2018

## 2018-10-28 NOTE — Progress Notes
Infectious Diseases Progress Note    Today's Date:  10/28/2018  Admission Date: 10/22/2018    Reason for this consultation: COVID 19 patient. Started on remdesevir 7/29. Please evaluate and makes recs for COVID mgmt  Consult type:Written opinion only    Assessment:     COVID-19 pneumonia with acute hypoxic respiratory failure  -Symptom onset ~7/22, progressive dyspnea, productive cough, fever/chills over the next few days  -7/25 - presented to [facility].  Hypoxic requiring 6L.  Admitted, started on vancomycin + Zosyn for suspected pneumonia +/- LE cellulitis  -7/25 BC x2 - unknown results  -7/26 - SARS-CoV-2 PCR positive   -7/26 CT chest ([place])??? posterior bilateral lower lobe groundglass and consolidative opacities consistent with pneumonia, mild right upper and middle and left lower lobe groundglass opacities also present.  -Transfer to [place] on 7/29 after developing worsening hypoxia requiring BIPAP the night prior.  No antiviral therapies at [facility], no steroids administered  -7/29 [place] - T 39.0, RR low 30s, started on 60L Comfort Flow with 100% FiO2 for progressive hypoxia.  WBC 4.4 (ALC 220), D-dimer 1038, ferritin 511, CRP 37.4, LDH 346.  Procal 1.54 (7/29) --> 1.79 (7/30)  -7/29 BC x2 - NGTD  -7/29 MRSA pneumonia PCR, Legionella and S pneumo urine Ags - negative  -7/29 - started remdesivir + dexamethasone.  Consented for convalescent plasma, received 2 units on 7/30-7/31 overnight    DM type II  -Insulin dependent, having hypoglycemia PTA  -Hyperglycemia at [place] w/dexamethasone    CKD stage III  -Estimated Creatinine Clearance: 43 mL/min (A) (based on SCr of 1.92 mg/dL (H)).    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     Chronic LE edema w/ venous stasis, reported recent episode cellulitis  -No evidence cellulitis on exam on initial ID evaluation on 7/30    Abx allergies/intolerance -TMP/SMX: break out in hives per patient  -Cefuorixime - reported in chart, patient did not recall, will have to confirm.  Reported hives  -Clindamycin - reported in chart, patient did not recall, will have to confirm.  Reported rash      Recommendations:     -Finished remdesivir  -Continue dexamethasone 6 mg daily, day 6/10  -Received 2 units convalescent plasma overnight (7/30-7/31)  -Monitor CBC w/diff, CMP, d-dimer, CRP, ferritin, and LDH daily        Interval Hx/ROS     Seen and examined.  Afebrile overnight, HR/BP stable.    O2 down to 4L/min    Feeling better overall.  Still having intermittent cough.    No chest pain, no N/V, eating well currently.  No diarrhea, had a regular BM today.  No HA.    WBC 8.7, hb 9.6, plts 220  Cr 1.92 - improving  D-dimer 1060, ferritin 655, CRP 8.58    7/31 CXR: Stable bilateral patchy pulmonary opacities    Antimicrobial Start date End date   Vancomycin 7/25 7/29   Zosyn 7/25 7/29   Remdesivir 7/29 8/2   Levaquin 7/30 7/30                  Estimated Creatinine Clearance: 43 mL/min (A) (based on SCr of 1.92 mg/dL (H)).      Allergies     Allergies   Allergen Reactions   ??? Lisinopril SEE COMMENTS     hyperkalemia   ??? Cefuroxime HIVES   ???  Clindamycin RASH   ??? Sulfa (Sulfonamide Antibiotics) HIVES   ??? Plavix [Clopidogrel] RASH   ??? Quinine      Allergy recorded in SMS: QUININE       Medications   Scheduled Meds:allopurinoL (ZYLOPRIM) tablet 100 mg, 100 mg, Oral, BID  amLODIPine (NORVASC) tablet 10 mg, 10 mg, Oral, QDAY  budesonide-formoterol (SYMBICORT HFA) 160-4.5 mcg/actuation inhalation 2 puff, 2 puff, Inhalation, BID  carvediloL (COREG) tablet 25 mg, 25 mg, Oral, BID  chlorthalidone (HYGROTON) tablet 25 mg, 25 mg, Oral, QDAY  dexamethasone (DECADRON) injection 6 mg, 6 mg, Intravenous, QDAY  enoxaparin (LOVENOX) syringe 30 mg, 30 mg, Subcutaneous, BID  furosemide (LASIX) tablet 80 mg, 80 mg, Oral, Q48H*  insulin aspart U-100 (NOVOLOG FLEXPEN) injection PEN 0-12 Units, 0-12 Units, Subcutaneous, ACHS (22)  insulin aspart U-100 (NOVOLOG FLEXPEN) injection PEN 12 Units, 12 Units, Subcutaneous, TID w/ meals  insulin NPH (HUMULIN N KwikPen) injection PEN 20 Units, 20 Units, Subcutaneous, TID  ipratropium/albuterol (COMBIVENT RESPIMAT) inhaler 1 puff, 1 puff, Inhalation, QID  levothyroxine (SYNTHROID) tablet 75 mcg, 75 mcg, Oral, QDAY 30 min before breakfast  simvastatin (ZOCOR) tablet 20 mg, 20 mg, Oral, QHS    Continuous Infusions:    PRN and Respiratory Meds:acetaminophen Q4H PRN      Physical Examination                          Vital Signs: Last                  Vital Signs: 24 Hour Range   BP: 129/72 (08/03 2300)  Temp: 36.7 ???C (98 ???F) (08/03 2300)  Pulse: 69 (08/04 0018)  Respirations: 18 PER MINUTE (08/03 2300)  SpO2: 94 % (08/04 0018) BP: (123-149)/(72-79)   Temp:  [36.3 ???C (97.4 ???F)-36.7 ???C (98 ???F)]   Pulse:  [69-78]   Respirations:  [18 PER MINUTE-20 PER MINUTE]   SpO2:  [94 %-98 %]      General:  sitting up in chair, NAD  HENT: no oral lesions, no thrush. Comfort Flow cannula in place  Eyes: Conj nl  Lungs: Diminished bilaterally, increased WOB, tachypnea  Heart: Regular rhythm, reg rate, with no murmur, rub, gallop  Abdomen: soft, non-tender, non-distended  Ext:  1+ lower extremity edema with wrinkling of skin  Skin: Chronic bilateral LE venous stasis skin changes.  Superficial ulcerations on L lower leg, no surrounding erythema, no evidence of cellulitis  Neuro: Alert interactive    Lines: PIV    Lab Review   Hematology  Recent Labs     10/26/18  0405 10/27/18  0414   WBC 7.7 8.7   HGB 10.1* 9.6*   HCT 29.3* 27.7*   PLTCT 208 220     Chemistry  Recent Labs     10/26/18  0405 10/27/18  0414   NA 141 143   K 4.0 4.2   CL 108 110   CO2 22 22   BUN 69* 68*   CR 2.02* 1.92*   GFR 32* 34*   GLU 279* 238*   CA 7.5* 7.7*   PO4 3.7 3.9   ALBUMIN 3.0* 2.9*   ALKPHOS 54 50   AST 22 15   ALT 15 13   TOTBILI 0.4 0.4       Microbiology, Radiology and other Diagnostics Review Microbiology data reviewed.    Pertinent radiology images viewed.  Personally reviewed [place] CXR from 7/29 and [place] CT chest from  7/26     [place] I Colon Flattery, MD   Infectious Diseases Faculty  Pager [phone]

## 2018-10-28 NOTE — Progress Notes
2132: Patients BS at [phone]. Day team corrected with 10 units novolog. BS at 2125 311. Orders to notify if over 300. MPA notified. Per MD Clent Ridges, give scheduled insulin.

## 2018-10-29 LAB — MAGNESIUM: Lab: 2.5 mg/dL — ABNORMAL LOW (ref 1.6–2.6)

## 2018-10-29 LAB — POC GLUCOSE
Lab: 266 mg/dL — ABNORMAL HIGH (ref 70–100)
Lab: 186 mg/dL — ABNORMAL HIGH (ref 70–100)
Lab: 180 mg/dL — ABNORMAL HIGH (ref 70–100)
Lab: 227 mg/dL — ABNORMAL HIGH (ref 70–100)

## 2018-10-29 LAB — CBC AND DIFF: Lab: 7.6 10*3/uL — ABNORMAL HIGH (ref >60)

## 2018-10-29 LAB — PHOSPHORUS: Lab: 4.5 mg/dL — ABNORMAL LOW (ref 2.0–4.5)

## 2018-10-29 LAB — COMPREHENSIVE METABOLIC PANEL: Lab: 141 MMOL/L — ABNORMAL LOW (ref 137–147)

## 2018-10-29 MED ORDER — PRAMIPEXOLE 0.25 MG PO TAB
1.5 mg | Freq: Every evening | ORAL | 0 refills | Status: DC
Start: 2018-10-29 — End: 2018-11-01
  Administered 2018-10-30 – 2018-11-01 (×6): 1.5 mg via ORAL

## 2018-10-29 MED ORDER — ERGOCALCIFEROL (VITAMIN D2) 1,250 MCG (50,000 UNIT) PO CAP
1 | ORAL_CAPSULE | ORAL | 0 refills | Status: CN
Start: 2018-10-29 — End: ?

## 2018-10-29 MED ORDER — INSULIN ASPART 100 UNIT/ML SC FLEXPEN
18 [IU] | Freq: Three times a day (TID) | SUBCUTANEOUS | 0 refills | Status: DC
Start: 2018-10-29 — End: 2018-11-01

## 2018-10-29 NOTE — Care Plan
Problem: Discharge Planning  Goal: Participation in plan of care  Outcome: Goal Ongoing  Goal: Knowledge regarding plan of care  Outcome: Goal Ongoing  Goal: Prepared for discharge  Outcome: Goal Ongoing     Problem: Infection, Risk of  Goal: Absence of infection  Outcome: Goal Ongoing  Goal: Knowledge of Infection Control Procedures  Outcome: Goal Ongoing     Problem: Skin Integrity  Goal: Skin integrity intact  Outcome: Goal Ongoing  Goal: Healing of skin (Wound & Incision)  Outcome: Goal Ongoing  Goal: Healing of skin (Pressure Injury)  Outcome: Goal Ongoing     Problem: Falls, High Risk of  Goal: Absence of falls-Adult Patient  Outcome: Goal Ongoing     Problem: Self-Care Deficit  Goal: Maximize ADL functioning  Outcome: Goal Ongoing     Problem: Mobility/Activity Intolerance  Goal: Maximize functional ADL's and mobility outcomes  Outcome: Goal Ongoing     Problem: Glucose Management  Goal: Absence of hyperglycemia  Outcome: Goal Ongoing  Goal: Absence of Hypoglycemia  Outcome: Goal Ongoing  Goal: Glucose level within specified parameters  Outcome: Goal Ongoing

## 2018-10-29 NOTE — Progress Notes
COVID RECOVERED    IPAC consulted for consideration to remove patient from COVID-19 isolation. IPAC confirmed with RN that this has been discussed with the physician group and everyone is in agreement.    It was determined that patient meets recovery criteria on 10/29/18 (first positive 10/19/18).     COVID organism flag resolved and isolation orders discontinued.    Patient is to remain in isolation until transferred to a new unit/clean room.  Upon arrival to new room, patient does not require COVID related isolation precautions.     Please note that subsequent COVID PCR tests may continue to result positive; however, that is not reflective of active viral infection. Patients are known to test positive via PCR for weeks after the infectious phase of illness (see reference below for further info).    Reference information from the policy ???Care of a Patient with COVID-19, section Discontinuation of Transmission Based Precautions    Acute Care Status:  COVID-19 patients with confirmatory laboratory testing on acute care status are considered recovered and isolation precautions can be discontinued if the following criteria are met:  ? At least 24 hours have passed since recovery defined as resolution of fever equal to or greater than 38.1o C (100.5o F) without the use of fever-reducing medications   ? and improvement in respiratory symptoms (e.g., cough, shortness of breath); and,  ? 10 days after first diagnostic test. Date of first diagnostic test is counted as day one.    Patients on acute-care status with high suspicion of having COVID-19 without confirmatory laboratory testing are considered recovered and can be removed from isolation when the following criteria are met:  ? At least 24 hours have passed since recovery defined as resolution of fever 38.1o C (100.5o F) without the use of fever-reducing medications and improvement in respiratory symptoms (e.g., cough, shortness of breath); and, ? 10 days after symptoms first appeared. First date of symptoms is counted as day one.    Rationale:   Available data indicate that persons with mild to moderate COVID-19 remain infectious no longer than 10 days after symptom onset. Persons with more severe to critical illness or severe immunocompromise likely remain infectious no longer than 20 days after symptom onset.  Recovered persons can continue to shed detectable SARS-CoV-2 RNA in upper respiratory specimens for up to 3 months after illness onset, albeit at concentrations considerably lower than during illness, in ranges where replication-competent virus has not been reliably recovered and infectiousness is unlikely. The etiology of this persistently detectable SARS-CoV-2 RNA has yet to be determined. Studies have not found evidence that clinically recovered persons with persistence of viral RNA have transmitted SARS-CoV-2 to others. These findings strengthen the justification for relying on a symptom based, rather than test-based strategy for ending isolation of these patients, so that persons who are by current evidence no longer infectious are not kept unnecessarily isolated and excluded from work or other responsibilities.  Concentrations of SARS-CoV-2 RNA measured in upper respiratory specimens decline after onset of symptoms (CDC, unpublished data, 2020; Jearld Lesch al., 2020; Young et al., 2020; Meredith Pel et al., 2020; W???lfel et al., 2020; Daun Peacock et al., 2020). The likelihood of recovering replication-competent virus also declines after onset of symptoms. For patients with mild to moderate COVID-19, replication-competent virus has not been recovered after 10 days following symptom onset (CDC, unpublished data, 2020; W???lfel et al., 2020; Arons et al., 2020; Bullard et al., 2020; Lu et al., 2020; personal communication with Loma Sender al.,  2020; [place] CDC, 2020). Recovery of replication-competent virus between 10 and 20 days after symptom onset has been documented in some persons with severe COVID-19 that, in some cases, was complicated by immunocompromised state Daun Peacock et al., 2020). However, in this series of patients, it was estimated that 88% and 95% of their specimens no longer yielded replication-competent virus after 10 and 15 days, respectively, following symptom onset.  An estimated 95% of severely or critically ill patients, including some with severe immunocompromise, no longer had replication-competent virus 15 days after onset of symptoms; no patients had replication-competent virus more than 20 days after onset of symptoms. These data have been generated from adults across a variety of age groups and with varying severity of illness (CDC, 2020).  We rely on the best evidence available to approximate when and for how long someone may be infectious to others. The health system's discontinuation of isolation guideline was developed with more stringent criteria than what is currently recommended by state and national public health officials. We acknowledge that illness onset likely occurs prior to diagnostic testing. Setting recovery criteria after the first diagnostic test rather than symptom onset assures that we are isolating beyond the duration of infectivity based on available evidence.  Discontinuation of isolation precautions will be made when Attending agrees patient has met recovery criteria in coordination with the Infection Prevention and Control department (24/7 pager ??? [phone]).      Room cleaning post-discharge of suspected or confirmed COVID-19 patients:  ? INPATIENT UNITS following discharge of patient in Contact and Droplet precautions with eye protection   ? Unit staff strips room and wipes down any equipment that will leave the room (ex. IV pump, SCD pumps)   ? Isolation signage should remain in place for clear communication to EVS staff   ? No downtime of room is required before terminal clean can be performed by EVS staff   ? EVS staff will use Contact and Droplet Precautions with eye protection   ? PPE needed: surgical mask, goggles/face shield, gown, and gloves  ? Use Oxycide with appropriate contact/dwell time   ? Room can re-open when dry

## 2018-10-29 NOTE — Progress Notes
PHYSICAL THERAPY  PROGRESS NOTE        Name: Paul Benjamin        MRN: [id number]          DOB: 13-Jul-1945          Age: 73 y.o.  Admission Date: 10/22/2018             LOS: 7 days    Mobility  Progressive Mobility Level: Walk in room  Distance Walked (feet): 180 ft  Level of Assistance: Stand by assistance  Assistive Device: Walker  Time Tolerated: 11-30 minutes  Activity Limited By: Shortness of air    Subjective  Significant hospital events: PMH significant for HTN, HLD, chronic HFpEF, DM, hypothyroidism, and restless leg syndrome who presented to OSH with AMS and hypoglycemia; found to have COVID-19. Overnight 7/28-7/29, pt became febrile with increasing oxygen requirement (O2 sat low 90s on 9 L/NC). Transferred to [facility] for worsening acute hypoxic respiratory failure in the setting of COVID-19 pneumonia. Requiring comfort flow.   Mental / Cognitive Status: Alert;Oriented;Cooperative;Follows Commands  Pain: Patient has no complaint of pain  Comments: 3L 02 NC  Ambulation Assist: Independent Mobility in [employer] without Device  Patient Owned Equipment: Single Journalist, newspaper  Home Situation: Lives with Family(wife, uses cane)  Type of Home: House  Entry Stairs: 6-10 Stairs;Rail on 1 Side(10)  In-Home Stairs: 1-2 Flights of Stairs;Rail on 1 Side(18 steps to bedroom)    Bed Mobility/Transfer  Bed Mobility: Supine to Sit: Standby Assist;Head of Bed Elevated  Transfer Type: Sit to Stand  Transfer: Assistance Level: From;Bed;Minimal Assist  Transfer: Assistive Device: Nurse, adult  Transfers: Type Of Assistance: Materials engineer;For Strength Deficit  Other Transfer Type: Sit to Stand  Other Transfer: Assistance Level: From;Toilet;Bed Side Chair;Standby Assist  Other Transfer: Assistive Device: Nurse, adult  Other Transfer: Type Of Assistance: Verbal Cues  End Of Activity Status: Up in Chair;Nursing Notified;Instructed Patient to Request Assist with Mobility;Instructed Patient to Use Call Light    Gait Gait Distance: (20' then 61' then 39' with seated rest)  Gait: Assistance Level: Standby Assist;Management of Lines  Gait: Assistive Device: Nurse, adult  Gait: Descriptors: Forward trunk flexion;Swing-Through Gait;No balance loss;Normal step length  Activity Limited By: SOA  Comments: Ambulated to toilet and performed self care.  Then ambulated 100' in room.  Seated rest then additional 80'.  Increased 02 to 4L with ambulation.  Tolerates well and recovers SOA within 1-2 minute rest.  Sp02 as low as 87% at end of first walk. >90% with second. I called spouse and gave progress update at RN request.      Assessment/Progress  Impaired Mobility Due To: Decreased Activity Tolerance;Decreased Strength;Deconditioning;Medical Status Limitation  Assessment/Progress: Improving as Expected  Comments: Patient making good progress with therapy and tolerates exercise well.  Requiring 02 but improving.  PT will focus on endurance activity    AM-PAC 6 Clicks Basic Mobility Inpatient  Turning from your back to your side while in a flat bed without using bed rails: None  Moving from lying on your back to sitting on the side of a flatbed without using bedrails : None  Moving to and from a bed to a chair (including a wheelchair): None  Standing up from a chair using your arms (e.g. wheelchair, or bedside chair): A Little  To walk in hospital room: A Little  Climbing 3-5 steps with a railing: A Little  Raw Score: 21  Standardized (T-scale) Score: 45.55  Basic Mobility CMS 0-100%:  29.52  CMS G Code Modifier for Basic Mobility: CJ    Goals  Goal Formulation: With Patient  Time For Goal Achievement: 5 days, To, 7 days  Patient Will Go Supine To Sit: Independently  Patient Will Go Sit to Supine : Independently  Patient Will Transfer Bed/Chair: w/ Stand By Assist, Ongoing  Patient Will Transfer Sit to Stand: w/ Stand By Assist  Patient Will Ambulate: 101-150 Feet, w/ Dan Humphreys, w/ Stand By Assist Patient Will Go Up / Down Stairs: 6-10 Stairs, w/ Stand By Assist    Plan  Treatment Interventions: Mobility Training;Strengthening  Plan Frequency: 3-5 Days per Week  PT Plan for Next Visit: Ambulate in room as tolerated; monitor Sp02 and titrate 02 as needed.  steps as tolerated;     PT Discharge Recommendations  Recommendation: Home with intermittent supervision/assistance  Recommendation for Therapy Post Discharge: Home health  Patient Currently Requires Equipment: None;Owns what is needed  Comments: Patient making good progress with therapy and anticipate if patientnt continues to do so patient will likely be able to discharge home. Unclear if patient will need 02 at time of discharge.     Therapist: Ernestine Mcmurray, PT  Date: 10/29/2018

## 2018-10-29 NOTE — Progress Notes
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 Patient swabbed CoVID-19 positive on 7/26 and subsequently hospitalized at OSH. Transferred to [facility] on 7/29.    MICU Course (7/29-8/2)  ??? Paul Benjamin is a 73 year old male who presented from an outside hospital with altered mental status and hypoglycemia.  Patient was found to be COVID-19 positive on 10/19/2018.  At outside hospital, patient received 5 days course of vancomycin and Zosyn (not continued on admission here at [facility]).  Patient was transferred for worsening acute hypoxic respiratory failure requiring comfort flow.  Patient started on Decadron 6 mg daily (to end 8/7).  Patient was also started on Remdesevir on 7/29 which was completed 8/2.  ID was consulted and decision was made to defer starting Tocilizumab at this time. Additionally, patient was also started on doxycycline 100 mg twice daily to end 10/27/2018 given elevated procal.  Patient also currently on increased prophylactic dose of Lovenox per COVID-19 protocol given patient's elevated d-dimer.   ??? Patient blood pressures were difficult to control in the setting of dexamethasone.  Briefly required insulin drip to control blood glucose levels and subsequently transition to subcu insulin.  Patient was no longer requiring insulin drip on the day of transfer to the floor. ??? Oxygen requirements the day of transfer to the floor, 4 L of O2 via NC.    Current Assessment:   ??? Acute Hypoxic Respiratory Failure secondary to CoVID-19 PNA   ??? CoVID-19 Recovered as of 10/29/2018  ??? CoVID Inflammatory labs/markers: discontinued on 8/3  ??? D-Dimer: 1,038 (admission) --> 876 --> 1,001 --> 974 -->738--> 1,060  ??? LDH: 346 (on admission)-->396 --> 464 --> 440  ??? Ferritin: 511 (on admission) --> 701 -->655  ??? CRP 27.38 (admission) --> 11.34 --> 8.38  ??? IL6 on admission: 137.5  ??? Procalcitonin on admission: 1.54; Urine strep/legionella, MRSA PNA negative   ??? Blood Cx on admission: NGTD   ??? ABG on admission: 7.46/30/94  ??? CXR on admission: Increasing patchy right greater than left mid and lower lung zone opacity concerning for progression of multifocal pneumonia.  ??? CXR (10/24/2018): No significant change of patchy bilateral pulmonary opacities  ??? CoVID-19 regimen during hospitalization:  ??? Convalescent Plasma (7/30, 7/31)  ??? Doxycycline (8/1-current w/ expectation to complete a 5 day course)  ??? Remdesivir (7/29-8/2)  ??? Dexamethasone (7/29-8/7)  ??? Lovenox 30 mg twice daily  ??? Respimat 4 times daily  ??? Symbicort twice daily   ??? Encouraging prone position, however, it appears due to discomfort is not doing it   ??? Diarrhea secondary to CoVID-19 I resolved  ??? HFpEF I stable, nothing to suggest acute decompensation on admission  ??? Essential HTN  ??? Follows with Paul Benjamin, Dr Paul Benjamin (  last seen in clinic on 08/28/2018)  ??? Goal Dry weight:267 lbs  ??? BNP on admission: 67  ??? Prior to arrival regimen: Carvedilol 25 mg twice daily, amlodipine 10 mg daily, chlorthalidone 25 mg daily, furosemide 80 mg every other day (to increase to twice daily if weight is greater than 272 pounds)  ??? CAD (status post DES to LAD 01/21/2004)  ??? Paroxysmal Atrial Fibrillation  ??? (05/2007) sotalol initiation and cardioversion  ??? (09/23/2007) status post ablation ??? Prior to arrival regimen: Carvedilol 25 mg twice daily (continued on admission)  ??? Not on [employer] prior to admission  ??? 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 Dyslipidemia  ??? Prior to arrival regimen: Zocor 20 mg nightly (continued on admission)  ??? Sleep Apnea/Periodic Limb Movement Disorder  ??? Prior to arrival regimen: Mirapex 1.5 mg nightly   ??? Prior to arrival CPAP/BiPAP  ??? Hypothyroidism  ??? Prior to arrival regimen: Levothyroxine 75 mcg daily (continued on admission)  ??? T2DM (insulin dependent  ??? Prior to arrival regimen: Levemir 30 units twice daily, aspart 8 units 3 times daily with meals, glimepiride 4 mg twice daily with meals  ??? Current regimen: NPH 30 units 3 times daily (changed from 20 U 3 times daily on 8/4), aspart 14 units 3 times daily with meals (increased from 14 U), MDCF  ??? 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 Baseline Cr ~ 1.8-2  ??? Debility/Deconditioning  ??? Secondary to CoVID-19 illness    Labs/Imaging/Medications/Consultant recommendations reviewed (if applciable)    Current Plan:   ???Acute Hypoxic Respiratory Failure secondary to CoVID-19 PNA  ??? Continue dexamethasone IV 6 mg daily expectation to complete a 10-day course   ??? Continue to wean O2 as tolerated  ??? Respimat 4 times daily and Symbicort twice daily   ??? Mucinex 600 mg twice daily   ??? Continue Lovenox 30 mg twice daily  ???Debility/deconditioning  ??? PT/OT        FEN: no IVF, electrolytes reviewed, DIET CARDIAC (LOW FAT/LOW NA) & DIABETIC   Prophalyxis:  Lovenox  Disposition: Med Private A - 2777  Code status: Full Code (Discussed on 10/29/2018)        Ezzard Flax, MD       10/29/2018  Internal Medicine         [place]  Ezzard Flax, MD  or page:  [phone]    Subjective     In brief, had the pleasure meeting with and evaluating/examining Paul Benjamin's am.  I discussed the above assessment/plan and attempt answer questions best my clinical knowledge.  Indicated he understands and is agreeable to the above assessment/plan.    Patient without acute/new concerns.    Otherwise, ROS described below    Review of Systems   A comprehensive 14-point ROS  was negative except for:      Review of Systems   Constitutional: Negative for chills, diaphoresis, fever, malaise/fatigue and weight loss.   HENT: Negative for congestion, ear discharge, ear pain, hearing loss, nosebleeds, sinus pain, sore throat and tinnitus.    Eyes: Negative for blurred vision, double vision, photophobia, pain, discharge and redness.   Respiratory: Positive for cough and shortness of breath. Negative for hemoptysis, sputum production, wheezing and stridor.    Cardiovascular: Negative for chest pain, palpitations, orthopnea, claudication, leg swelling and PND.   Gastrointestinal: Negative for abdominal pain, blood in stool, constipation, diarrhea, heartburn, melena, nausea and vomiting.   Genitourinary: Negative for dysuria,  flank pain, frequency, hematuria and urgency.   Musculoskeletal: Negative for back pain, falls, joint pain, myalgias and neck pain.   Skin: Negative for itching and rash.   Neurological: Negative for dizziness, tingling, tremors, sensory change, speech change, focal weakness, seizures, loss of consciousness, weakness and headaches.   Endo/Heme/Allergies: Negative for polydipsia.     Past medical history    has a past medical history of Atrial fibrillation (HCC), coronary artery disease, Hypercholesterolemia, Hypertension, Sleep apnea, and SSS (sick sinus syndrome) (HCC).     Past surgical history    has a past surgical history that includes pacemaker placement (09/30/06).       Family history     Family History   Problem Relation Age of Onset   ??? Asthma Father    ??? Cardiovascular Father    ??? Hypertension Mother      Social history     Social History     Socioeconomic History   ??? Marital status: Married     Spouse name: Not on file   ??? Number of children: Not on file   ??? Years of education: Not on file ??? Highest education level: Not on file   Occupational History     Employer: [employer]     Comment: retired   Tobacco Use   ??? Smoking status: Former Smoker     Packs/day: 1.00     Last attempt to quit: 03/26/1966     Years since quitting: 52.6   ??? Smokeless tobacco: Never Used   Substance and Sexual Activity   ??? Alcohol use: No   ??? Drug use: No   ??? Sexual activity: Not on file   Other Topics Concern   ??? Not on file   Social History Narrative   ??? Not on file      Allergies   Lisinopril; Cefuroxime; Clindamycin; Sulfa (sulfonamide antibiotics); Plavix [clopidogrel]; and Quinine    Medications     Current Facility-Administered Medications   Medication   ??? acetaminophen (TYLENOL) tablet 1,000 mg   ??? allopurinoL (ZYLOPRIM) tablet 100 mg   ??? amLODIPine (NORVASC) tablet 10 mg   ??? budesonide-formoterol (SYMBICORT HFA) 160-4.5 mcg/actuation inhalation 2 puff   ??? carvediloL (COREG) tablet 25 mg   ??? chlorthalidone (HYGROTON) tablet 25 mg   ??? cholecalciferol (VITAMIN D-3) tablet 1,000 Units   ??? dexamethasone (DECADRON) injection 6 mg   ??? enoxaparin (LOVENOX) syringe 30 mg   ??? ergocalciferol (VITAMIN D-2) capsule 50,000 Units   ??? furosemide (LASIX) tablet 80 mg   ??? insulin aspart U-100 (NOVOLOG FLEXPEN) injection PEN 0-12 Units   ??? insulin aspart U-100 (NOVOLOG FLEXPEN) injection PEN 18 Units   ??? insulin NPH (HUMULIN N KwikPen) injection PEN 30 Units   ??? ipratropium/albuterol (COMBIVENT RESPIMAT) inhaler 1 puff   ??? levothyroxine (SYNTHROID) tablet 75 mcg   ??? simvastatin (ZOCOR) tablet 20 mg          Physical exam     BP: 139/64 (08/05 1103)  Temp: 36.7 ???C (98 ???F) (08/05 1103)  Pulse: 71 (08/05 0814)  Respirations: 18 PER MINUTE (08/05 1103)  SpO2: 95 % (08/05 1103) BP: (129-139)/(64-79)   Temp:  [36.3 ???C (97.3 ???F)-36.7 ???C (98 ???F)]   Pulse:  [70-75]   Respirations:  [17 PER MINUTE-18 PER MINUTE]   SpO2:  [94 %-97 %]   Vitals:    10/27/18 0615 10/28/18 0500 10/28/18 1000 Weight: 112.4 kg (247 lb 12.8 oz) 112.7 kg (248 lb 7.3 oz)  110.5 kg (243 lb 9.6 oz)        General:  Alert and response to question appropriately.  VS as above. No acute distress .     Eyes: No conjunctival injection.  PERRLA.   No scleral icterus.    Ears, nose, mouth, and throat : No erythema.   MMM.   No erythema, exudate noted.    Neck:  Symmetric appearance without crepitus, no obvious mass or noticeable swelling. No JVD.   Lungs:  No use of accessory muscles.  Wheezing throughout. Currently on 4 L via NC  Cardiovascular: Regular rate, S1, S2 normal, no murmurs, clicks, rubs, or gallops appreciated .  2+ and symmetric, all extremities.  No edema in BLE.   Abdomen:  BS+, soft, no guarding or rigidity.  Nontender to palpation without palpable masses.  No rebound tenderness.   Skin: Skin color normal, no obvious evidence of rashes. Chronic bilateral stasis derm noted.   Musculoskeletal:  ROM normal. No joints swelling or erythema.   Neurologic:  Alerted and oriented .    Psych:  Alerted , calm, normal affect    Labs        Recent Labs     10/28/18  0500 10/29/18  0405   NA 142 141   K 4.1 4.6   CL 110 109   CO2 21 20*   BUN 73* 75*   CR 1.85* 1.94*   GAP 11 12   MG 2.5 2.5   PO4 4.2 4.5   GFR 36* 34*   ALBUMIN 3.2* 3.0*   TOTBILI 0.4 0.4   AST 12 14   ALT 14 13    Recent Labs     10/28/18  0500 10/29/18  0405   WBC 9.1 7.6   HGB 10.8* 10.3*   HCT 31.1* 29.6*   PLTCT 219 224   MCV 84.0 84.5          Radiology and Other Diagnostic Procedures Review     No results found.

## 2018-10-29 NOTE — Progress Notes
Pt. Wife, Vicente Males, called asking for an update on how the patient was doing. I informed her of his vitals and status. She asked if he was still contagious and I informed her that per the [facility] note he is considered COVID recovered but he will remain in isolation until he gets a new clean room. I told her that I would call if he moves rooms.     She also requested the PT call with an update in regards to how he is ambulating today. I told her I would pass the message along.

## 2018-10-30 ENCOUNTER — Encounter: Admit: 2018-10-30 | Discharge: 2018-10-30

## 2018-10-30 LAB — POC GLUCOSE
Lab: 276 mg/dL — ABNORMAL HIGH (ref 70–100)
Lab: 218 mg/dL — ABNORMAL HIGH (ref 70–100)
Lab: 279 mg/dL — ABNORMAL HIGH (ref 70–100)
Lab: 161 mg/dL — ABNORMAL HIGH (ref 70–100)

## 2018-10-30 LAB — COMPREHENSIVE METABOLIC PANEL: Lab: 139 MMOL/L — ABNORMAL LOW (ref 137–147)

## 2018-10-30 LAB — PHOSPHORUS: Lab: 5.4 mg/dL — ABNORMAL HIGH (ref >60)

## 2018-10-30 LAB — MAGNESIUM: Lab: 2.7 mg/dL — ABNORMAL HIGH (ref 1.6–2.6)

## 2018-10-30 LAB — CBC AND DIFF: Lab: 10 K/UL — ABNORMAL LOW (ref 4.5–11.0)

## 2018-10-30 MED ORDER — CALCIUM CARBONATE 200 MG CALCIUM (500 MG) PO CHEW
1000 mg | ORAL | 0 refills | Status: DC | PRN
Start: 2018-10-30 — End: 2018-11-01
  Administered 2018-10-30: 16:00:00 1000 mg via ORAL

## 2018-10-30 MED ORDER — NPH INSULIN HUMAN RECOMB 100 UNIT/ML (3 ML) SC PEN
35 [IU] | Freq: Three times a day (TID) | SUBCUTANEOUS | 0 refills | Status: DC
Start: 2018-10-30 — End: 2018-10-31

## 2018-10-30 MED ORDER — ACETAMINOPHEN/LIDOCAINE/ANTACID DS(#) 1:1:3  PO SUSP
30 mL | Freq: Once | ORAL | 0 refills | Status: CP
Start: 2018-10-30 — End: ?
  Administered 2018-10-30: 10:00:00 30 mL via ORAL

## 2018-10-30 MED ORDER — CHLORPROMAZINE 10 MG PO TAB
25 mg | Freq: Three times a day (TID) | ORAL | 0 refills | Status: DC | PRN
Start: 2018-10-30 — End: 2018-11-01
  Administered 2018-10-30: 16:00:00 25 mg via ORAL

## 2018-10-30 NOTE — Case Management (ED)
Case Management Progress Note    NAME:Paul Benjamin                          MRN: [id number]              DOB:Jun 05, 1945          AGE: 73 y.o.  ADMISSION DATE: 10/22/2018             DAYS ADMITTED: LOS: 8 days      Today???s Date: 10/30/2018    Plan  D/c plans ongoing, anticipate home with home health once medically stable.    Interventions  ? Support   Support: Pt/Family Updates re:POC or DC Plan  ? Info or Referral      ? Discharge Planning   Discharge Planning: Durable Medical Equipment and Supplies, Home Health     Called patient on his bedside phone and discussed discharge planning.  Informed him of the current recommendations made by PT. Offered choice list with quality data from [employer] and offered to answer questions.  Patient selected the following: [facility] home health.  NCM discussed possible need for oxygen too for patient at discharge. Informed him he will be tested for his need for the oxygen. Discussed oxygen agencies with the patient. Patient requested this NCM call his spouse [name] to discuss oxygen agencies.  NCM called patient's spouse [name] (ph: [phone]) and discussed the current PT recommendations. [name] is okay with home health and confirms that patient has used [facility]. [name] indicates that the company for the patient's current CPAP is [employer] and they would like to use that company for oxygen too, were the patient to need oxygen for home.  NCM sent referral to [facility] via Epic at the request of the patient.  NCM will continue to follow patient as needs arise.    ? Medication Needs                                                 ? Financial      ? Legal      ? Other   Other/None: No needs identified    Disposition  ? Expected Discharge Date    Expected Discharge Date: 11/01/18  Expected Discharge Time: 1500  ? Transportation   Does the patient need discharge transport arranged?: No Transportation Name, Phone and Availability #1: Wife will transport   ? Next Level of Care (Acute Psych discharges only)      ? Discharge Disposition                                          Durable Medical Equipment      No service has been selected for the patient.      [facility]      No service has been selected for the patient.      [facility]      No service has been selected for the patient.      [facility]      No service has been selected for the patient.        Para [place], [place]  Pager: [phone]  Office: [phone]

## 2018-10-30 NOTE — Progress Notes
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 Patient swabbed CoVID-19 positive on 7/26 and subsequently hospitalized at OSH. Transferred to [facility] on 7/29.    MICU Course (7/29-8/2)  ??? Paul Benjamin is a 73 year old male who presented from an outside hospital with altered mental status and hypoglycemia.  Patient was found to be COVID-19 positive on 10/19/2018.  At outside hospital, patient received 5 days course of vancomycin and Zosyn (not continued on admission here at [facility]).  Patient was transferred for worsening acute hypoxic respiratory failure requiring comfort flow.  Patient started on Decadron 6 mg daily (to end 8/7).  Patient was also started on Remdesevir on 7/29 which was completed 8/2.  ID was consulted and decision was made to defer starting Tocilizumab at this time. Additionally, patient was also started on doxycycline 100 mg twice daily to end 10/27/2018 given elevated procal.  Patient also currently on increased prophylactic dose of Lovenox per COVID-19 protocol given patient's elevated d-dimer.   ??? Patient blood pressures were difficult to control in the setting of dexamethasone.  Briefly required insulin drip to control blood glucose levels and subsequently transition to subcu insulin.  Patient was no longer requiring insulin drip on the day of transfer to the floor. ??? Oxygen requirements the day of transfer to the floor, 4 L of O2 via NC.    Current Assessment:   ??? Acute Hypoxic Respiratory Failure secondary to CoVID-19 PNA I presumably recovered (on RmA at rest)  ??? CoVID-19 Recovered as of 10/29/2018 I will not need to self quarantine at time of discharges  ??? CoVID Inflammatory labs/markers: discontinued on 8/3  ??? D-Dimer: 1,038 (admission) --> 876 --> 1,001 --> 974 -->738--> 1,060  ??? LDH: 346 (on admission)-->396 --> 464 --> 440  ??? Ferritin: 511 (on admission) --> 701 -->655  ??? CRP 27.38 (admission) --> 11.34 --> 8.38  ??? IL6 on admission: 137.5  ??? Procalcitonin on admission: 1.54; Urine strep/legionella, MRSA PNA negative   ??? Blood Cx on admission: NGTD   ??? ABG on admission: 7.46/30/94  ??? CXR on admission: Increasing patchy right greater than left mid and lower lung zone opacity concerning for progression of multifocal pneumonia.  ??? CXR (10/24/2018): No significant change of patchy bilateral pulmonary opacities  ??? CoVID-19 regimen during hospitalization:  ??? Convalescent Plasma (7/30, 7/31)  ??? Doxycycline (completed course)  ??? Remdesivir (7/29-8/2)  ??? Dexamethasone (7/29-8/7)  ??? Lovenox 30 mg twice daily  ??? Respimat 4 times daily  ??? Symbicort twice daily   ??? Prone position encouraged, however, unable tolerate  ??? Diarrhea secondary to CoVID-19 I resolved  ??? HFpEF I stable, nothing to suggest acute decompensation on admission  ??? Essential HTN  ??? Follows with [facility] specialist,  Dr Pierre Bali (last seen in clinic on 08/28/2018)  ??? Goal Dry weight:267 lbs  ??? BNP on admission: 67  ??? Prior to arrival regimen: Carvedilol 25 mg twice daily, amlodipine 10 mg daily, chlorthalidone 25 mg daily, furosemide 80 mg every other day (to increase to twice daily if weight is greater than 272 pounds)  ??? CAD (status post DES to LAD 01/21/2004)  ??? Paroxysmal Atrial Fibrillation  ??? (05/2007) sotalol initiation and cardioversion  ??? (09/23/2007) status post ablation ??? Prior to arrival regimen: Carvedilol 25 mg twice daily (continued on admission)  ??? Not on [facility] prior to admission  ??? 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 Dyslipidemia  ??? Prior to arrival regimen: Zocor 20 mg nightly (continued on admission)  ??? Sleep Apnea/Periodic Limb Movement Disorder  ??? Prior to arrival regimen: Mirapex 1.5 mg nightly   ??? Prior to arrival CPAP/BiPAP  ??? Hypothyroidism  ??? Prior to arrival regimen: Levothyroxine 75 mcg daily (continued on admission)  ??? T2DM (insulin dependent  ??? Prior to arrival regimen: Levemir 30 units twice daily, aspart 8 units 3 times daily with meals, glimepiride 4 mg twice daily with meals  ??? Current regimen: NPH 35 units 3 times daily (changed from 30 U 3 times daily on 8/6), aspart 18 units 3 times daily with meals (increased from 18 U), MDCF  ??? 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 Baseline Cr ~ 1.8-2  ??? Debility/Deconditioning  ??? Secondary to CoVID-19 illness    Labs/Imaging/Medications/Consultant recommendations reviewed (if applciable)    Current Plan:   ??? ???Acute Hypoxic Respiratory Failure secondary to CoVID-19 PNA Ipresumably recovered (on RmA at rest)  ??? Continue dexamethasone IV 6 mg daily expectation to complete a 10-day course (last dose 8/7)  ??? Respimat 4 times daily and Symbicort twice daily   ??? Mucinex 600 mg twice daily   ??? Continue Lovenox 30 mg twice daily  ??? Ex Ox, Overnight Oximetry tonight   ???Debility/deconditioning  ??? PT: Home with intermittent supervision/assistance.  Recommending home health PT.  ??? OT: Pending, when the room during my examination.        FEN: no IVF, electrolytes reviewed, DIET CARDIAC (LOW FAT/LOW NA) & DIABETIC   Prophalyxis:  Lovenox  Disposition: Med Private A - 2777  Code status: Full Code (Discussed on 10/30/2018)        Ezzard Flax, MD       10/30/2018  Internal Medicine         [place]  Ezzard Flax, MD  or page:  [phone]    Subjective In brief, had the pleasure meeting with and evaluating/examining Paul. Roland's am.  I discussed the above assessment/plan and attempt answer questions best my clinical knowledge.  Indicated he understands and is agreeable to the above assessment/plan.    This a.m., patient was found sitting up in bedside chair, resting comfortably without complaints.  He was also found to be on room air with SaO2 > 95% and maintain those sats while talking and laughing.    Otherwise, ROS described below    Review of Systems   A comprehensive 14-point ROS  was negative except for:      Review of Systems   Constitutional: Negative for chills, diaphoresis, fever, malaise/fatigue and weight loss.   HENT: Negative for congestion, ear discharge, ear pain, hearing loss, nosebleeds, sinus pain, sore throat and tinnitus.    Eyes: Negative for blurred vision, double vision, photophobia, pain, discharge  and redness.   Respiratory: Negative for cough, hemoptysis, sputum production, shortness of breath, wheezing and stridor.    Cardiovascular: Negative for chest pain, palpitations, orthopnea, claudication, leg swelling and PND.   Gastrointestinal: Negative for abdominal pain, blood in stool, constipation, diarrhea, heartburn, melena, nausea and vomiting.   Genitourinary: Negative for dysuria, flank pain, frequency, hematuria and urgency.   Musculoskeletal: Negative for back pain, falls, joint pain, myalgias and neck pain.   Skin: Negative for itching and rash.   Neurological: Negative for dizziness, tingling, tremors, sensory change, speech change, focal weakness, seizures, loss of consciousness, weakness and headaches.   Endo/Heme/Allergies: Negative for polydipsia.     Past medical history    has a past medical history of Atrial fibrillation (HCC), coronary artery disease, Hypercholesterolemia, Hypertension, Sleep apnea, and SSS (sick sinus syndrome) (HCC).     Past surgical history has a past surgical history that includes pacemaker placement (09/30/06).       Family history     Family History   Problem Relation Age of Onset   ??? Asthma Father    ??? Cardiovascular Father    ??? Hypertension Mother      Social history     Social History     Socioeconomic History   ??? Marital status: Married     Spouse name: Not on file   ??? Number of children: Not on file   ??? Years of education: Not on file   ??? Highest education level: Not on file   Occupational History     Employer: [employer]     Comment: retired   Tobacco Use   ??? Smoking status: Former Smoker     Packs/day: 1.00     Last attempt to quit: 03/26/1966     Years since quitting: 52.6   ??? Smokeless tobacco: Never Used   Substance and Sexual Activity   ??? Alcohol use: No   ??? Drug use: No   ??? Sexual activity: Not on file   Other Topics Concern   ??? Not on file   Social History Narrative   ??? Not on file      Allergies   Lisinopril; Cefuroxime; Clindamycin; Sulfa (sulfonamide antibiotics); Plavix [clopidogrel]; and Quinine    Medications     Current Facility-Administered Medications   Medication   ??? acetaminophen (TYLENOL) tablet 1,000 mg   ??? allopurinoL (ZYLOPRIM) tablet 100 mg   ??? amLODIPine (NORVASC) tablet 10 mg   ??? budesonide-formoterol (SYMBICORT HFA) 160-4.5 mcg/actuation inhalation 2 puff   ??? calcium carbonate (TUMS) chew tablet 1,000 mg   ??? carvediloL (COREG) tablet 25 mg   ??? chlorproMAZINE (THORAZINE) tablet 25 mg   ??? chlorthalidone (HYGROTON) tablet 25 mg   ??? dexamethasone (DECADRON) injection 6 mg   ??? enoxaparin (LOVENOX) syringe 30 mg   ??? ergocalciferol (VITAMIN D-2) capsule 50,000 Units   ??? furosemide (LASIX) tablet 80 mg   ??? insulin aspart U-100 (NOVOLOG FLEXPEN) injection PEN 0-12 Units   ??? insulin aspart U-100 (NOVOLOG FLEXPEN) injection PEN 18 Units   ??? insulin NPH (HUMULIN N KwikPen) injection PEN 35 Units   ??? ipratropium/albuterol (COMBIVENT RESPIMAT) inhaler 1 puff   ??? levothyroxine (SYNTHROID) tablet 75 mcg ??? pramipexole (MIRAPEX) tablet 1.5 mg   ??? simvastatin (ZOCOR) tablet 20 mg          Physical exam     BP: 130/77 (08/06 0800)  Temp: 36.4 ???C (97.6 ???F) (08/06 0800)  Pulse: 70 (08/06 0800)  Respirations: 16  PER MINUTE (08/06 0800)  SpO2: 98 % (08/06 0800) BP: (118-147)/(76-83)   Temp:  [36.3 ???C (97.3 ???F)-36.8 ???C (98.3 ???F)]   Pulse:  [70-77]   Respirations:  [16 PER MINUTE-18 PER MINUTE]   SpO2:  [95 %-100 %]   Vitals:    10/27/18 0615 10/28/18 0500 10/28/18 1000   Weight: 112.4 kg (247 lb 12.8 oz) 112.7 kg (248 lb 7.3 oz) 110.5 kg (243 lb 9.6 oz)        General:  Alert and response to question appropriately.  VS as above. No acute distress .     Eyes: No conjunctival injection.  PERRLA.   No scleral icterus.    Ears, nose, mouth, and throat : No erythema.   MMM.   No erythema, exudate noted.    Neck:  Symmetric appearance without crepitus, no obvious mass or noticeable swelling. No JVD.   Lungs:  No use of accessory muscles.  Wheezing throughout. Currently on RmA at rest  Cardiovascular: Regular rate, S1, S2 normal, no murmurs, clicks, rubs, or gallops appreciated .  2+ and symmetric, all extremities.  No edema in BLE.   Abdomen:  BS+, soft, no guarding or rigidity.  Nontender to palpation without palpable masses.  No rebound tenderness.   Skin: Skin color normal, no obvious evidence of rashes. Chronic bilateral stasis derm noted.   Musculoskeletal:  ROM normal. No joints swelling or erythema.   Neurologic:  Alerted and oriented .    Psych:  Alerted , calm, normal affect    Labs        Recent Labs     10/29/18  0405 10/30/18  0800   NA 141 139   K 4.6 4.3   CL 109 106   CO2 20* 16*   BUN 75* 86*   CR 1.94* 2.06*   GAP 12 17*   MG 2.5 2.7*   PO4 4.5 5.4*   GFR 34* 32*   ALBUMIN 3.0* 3.4*   TOTBILI 0.4 0.5   AST 14 15   ALT 13 12    Recent Labs     10/29/18  0405 10/30/18  0800   WBC 7.6 10.5   HGB 10.3* 11.6*   HCT 29.6* 34.7*   PLTCT 224 261   MCV 84.5 85.1          Radiology and Other Diagnostic Procedures Review No results found.

## 2018-10-30 NOTE — Progress Notes
OCCUPATIONAL THERAPY  PROGRESS NOTE      Name: Paul Benjamin        MRN: [id number]          DOB: [birth year]          Age: 73 y.o.  Admission Date: 10/22/2018             LOS: 8 days      Mobility  Patient Turn/Position: Self  Progressive Mobility Level: Stand  Level of Assistance: Stand by assistance  Assistive Device: Walker  Time Tolerated: 0-10 minutes  Activity Limited By: Fatigue    Subjective  Pertinent Dx per Physician: 73 y/o male with PMH significant for HTN, HLD, chronic HFpEF, DM, hypothyroidism, and restless leg syndrome who presented to OSH with AMS and hypoglycemia; found to have COVID-19. Overnight 7/28-7/29, pt became febrile with increasing oxygen requirement (O2 sat low 90s on 9 L/NC). Transferred to [facility] for worsening acute hypoxic respiratory failure in the setting of COVID-19 pneumonia. Requiring comfort flow.   Precautions: Standard;Falls  Pain / Complaints: Patient has no c/o pain;Patient agrees to participate in therapy  Comments: Pt on RA, sats in low to mid 90's.    Objective  Psychosocial Status: Willing and Cooperative to Participate  Persons Present: Provider    Home Living  Type of Home: House  Home Layout: Two Level;Bed/Bath Upstairs(8 steps to enter; 18 steps to bed/bath)  Financial risk analyst / Tub: Tub/Shower Unit  [employer] Equipment: Paediatric nurse  Home Equipment: Walker;Cane;Wheelchair-manual  Comment: Pt states that he has approximately 15 steps to the bathroom from his bedroom. Pt states that he has considered making sleeping arrangements on the main level of the house initially when he goes home to avoid stairclimbing to the bedroom. There is a /12 bath available on the main level.    Prior Function  Level Of Independence: Independent with ADLs and functional transfers;Independent with homemaking w/ ambulation  Lives With: Spouse(Spouse works during the day.)  Other Function Comments: Pt has 5 children and 3 of the children would be able to assist if needed PRN at d/c            ADL's Where Assessed: Chair(Pt in recliner upon entry.)  LE Dressing Assist: Stand By Assist  LE Dressing Deficits: Requires Assistive Device for Steadying;Supervision/Safety;Don/Doff R Sock;Don/Doff L Sock;Thread RLE Into Pants;Thread LLE Into Pants;Pull Up Over Hips  Comment: Pt declined toileting, stating that he had been up to the toilet recently and ambulated with PT.    ADL Mobility  Transfer Type: Sit to stand;Stand to sit  Transfer: Assistance Level: Standby assist  Transfer: Assistive Device: Roller walker  End of Activity Status: Up in chair      Cognition  Overall Cognitive Status: WFL to Adequately Complete Self Care Tasks Safely  Social Interaction: Interacts in a Network engineer         Education  Persons Educated: Patient  Barriers To Learning: None Noted  Teaching Methods: Verbal Instruction  Patient Response: Verbalized Understanding  Topics: Energy Conservation;Home safety  Comments: Pt states that he gets up 3 or 4 times a night to use the toilet. Pt expressing some concern about getting up to the toilet at night. Discussed use of bedside urinal, pt states that he has tried this but does not particularly like to use the urinal due to spillage. Discussed reducing liquid intake several hours before bed to help minimize the need to get up.    Assessment  Assessment: Decreased ADL Status;Decreased Endurance;Decreased  Self-Care Trans;Decreased High-Level ADLs  Prognosis: Good;w/Cont OT s/p Acute Discharge  Goal Formulation: Patient    AM-PAC 6 Clicks Daily Activity Inpatient  Putting on and taking off regular lower body clothes?: Total  Bathing (Including washing, rinsing, drying): A Lot  Toileting, which includes using toilet, bedpan, or urinal: Total  Putting on and taking off regular upper body clothing: A Little  Taking care of personal grooming such as brushing teeth: None  Eating meals?: None  Daily Activity Raw Score: 15  Standardized (t-scale) score: 34.69  CMS 0-100% Score: 56.46 CMS G Code Modifier: CK    Plan  Progress: Progressing Toward Goals  OT Frequency: 2-3x/week  OT Plan for Next Visit: Standing endurance, toileting,        ADL Goals  Patient Will Perform All ADL's: w/ Stand By Assist    Functional Transfer Goals  Pt Will Perform All Functional Transfers: w/ Stand By Assist         OT Discharge Recommendations  Recommendation: Home with intermittent supervision/assistance  Recommendation for Therapy Post Discharge: Home health  Patient Currently Requires Physical Assist With: Bathing;In and out of house;Meal preparation  Patient Currently Requires Equipment: Owns what is needed        Therapist: Ebony Hail, OT  Date: 10/30/2018

## 2018-10-30 NOTE — Progress Notes
PHYSICAL THERAPY  PROGRESS NOTE        Name: Paul Benjamin        MRN: [id number]          DOB: [birth year]          Age: 73 y.o.  Admission Date: 10/22/2018             LOS: 8 days        Mobility  Patient Turn/Position: Self  Progressive Mobility Level: Walk in room  Distance Walked (feet): 50 ft  Level of Assistance: Stand by assistance  Assistive Device: Walker  Time Tolerated: 11-30 minutes  Activity Limited By: Fatigue    Subjective  Significant hospital events: PMH significant for HTN, HLD, chronic HFpEF, DM, hypothyroidism, and restless leg syndrome who presented to OSH with AMS and hypoglycemia; found to have COVID-19. Overnight 7/28-7/29, pt became febrile with increasing oxygen requirement (O2 sat low 90s on 9 L/NC). Transferred to [facility] for worsening acute hypoxic respiratory failure in the setting of COVID-19 pneumonia. Requiring comfort flow.   Mental / Cognitive Status: Alert;Oriented;Cooperative;Follows Commands  Persons Present: (respiratory therapy )  Comments: room air   Ambulation Assist: Independent Mobility in [employer] without Device  Patient Owned Equipment: Single Journalist, newspaper  Home Situation: Lives with Family  Type of Home: House    Bed Mobility/Transfer  Bed Mobility: Supine to Sit: Standby Assist;Head of Bed Elevated  Transfer Type: Sit to Stand  Transfer: Assistance Level: From;Bed;Minimal Assist(contact guard for safety )  Transfer: Assistive Device: Nurse, adult  Transfers: Type Of Assistance: Materials engineer;Requires Extra Time  Other Transfer Type: Sit to Stand  Other Transfer: Assistance Level: From;Toilet;Bed Side Chair;Standby Assist  Other Transfer: Assistive Device: Roller Walker  End Of Activity Status: Up in Chair;Nursing Notified;Instructed Patient to Request Assist with Mobility;Instructed Patient to Use Call Light    Gait  Gait Distance: 10 feet(to/from toilet to bed side chair. )  Gait: Assistance Level: Standby Assist;Management of Lines Gait: Assistive Device: Nurse, adult  Gait: Descriptors: Forward trunk flexion;Swing-Through Gait;No balance loss;Normal step length  Stairs: Number Climbed: 4  Stairs: Descriptors: Ascend;Descend  Stairs: Assistance Level: Minimal Assist(use of bed rail on one side, hand hold assist on other )  Stairs: Assistive Device: One Rail(plus hand hold assist )  Activity Limited By: Complaint of Fatigue  Comments: respitatory therapy in room to complete ex ox, performs well on room air. requiring assise on stairs for HHA on R side as no rail is present. Patient with railing on both sides of steps to enter home     Assessment/Progress  Impaired Mobility Due To: Decreased Activity Tolerance;Decreased Strength;Deconditioning;Medical Status Limitation  Assessment/Progress: Improving as Expected  Comments: patient doing well on room air at this time. initiated stair training, fatigues quickly. SBA with ambulation with RW     AM-PAC 6 Clicks Basic Mobility Inpatient  Turning from your back to your side while in a flat bed without using bed rails: None  Moving from lying on your back to sitting on the side of a flatbed without using bedrails : None  Moving to and from a bed to a chair (including a wheelchair): None  Standing up from a chair using your arms (e.g. wheelchair, or bedside chair): A Little  To walk in hospital room: A Little  Climbing 3-5 steps with a railing: A Little  Raw Score: 21  Standardized (T-scale) Score: 45.55  Basic Mobility CMS 0-100%: 29.52  CMS G Code Modifier for Basic Mobility:  CJ    Goals  Goal Formulation: With Patient  Time For Goal Achievement: 5 days, To, 7 days  Patient Will Go Supine To Sit: Independently  Patient Will Go Sit to Supine : Independently  Patient Will Transfer Bed/Chair: w/ Stand By Assist, Ongoing  Patient Will Transfer Sit to Stand: w/ Stand By Assist  Patient Will Ambulate: 101-150 Feet, w/ Dan Humphreys, w/ Stand By Assist Patient Will Go Up / Down Stairs: 6-10 Stairs, w/ Stand By Assist    Plan  Treatment Interventions: Mobility Training;Strengthening  Plan Frequency: 5 Days per Week  PT Plan for Next Visit: increase ambulation distance, continue stair training and repeated curb step     PT Discharge Recommendations  Recommendation: Home with intermittent supervision/assistance  Recommendation for Therapy Post Discharge: Home health  Patient Currently Requires Equipment: None;Owns what is needed  Comments: anticiate patient to be able to complete entry steps into home with Bilateral railing. will benefit from continued PT for endurance training as patient fatigues quickly.     Therapist: Luan Moore, PT, DPT   Date: 10/30/2018

## 2018-10-30 NOTE — Progress Notes
RT Adult Assessment Note    NAME:Paul Benjamin             MRN: [id number]             DOB:October 19, 1945          AGE: 73 y.o.  ADMISSION DATE: 10/22/2018             DAYS ADMITTED: LOS: 8 days    RT Treatment Plan:            Additional Comments:  Impressions of the patient: Patient resting on 2 liters bled in with cpap machine.  Intervention(s)/outcome(s): Combivent QID, Symbicort BID, and OSA protocol.    Patient education that was completed: Na  Recommendations to the care team: Na    Vital Signs:  Pulse:  72  RR:    SpO2:  98  O2 Device:  Cannula  Liter Flow:   2 Lpm  O2%:    Breath Sounds:  Clear/Decreased  Respiratory Effort:  Non-labored

## 2018-10-30 NOTE — Progress Notes
RT Exercise Oximetry Note    Name: Paul Benjamin        MRN: [id number]          DOB: February 26, 1946          Age: 73 y.o.  Admission Date: 10/22/2018             LOS: 8 days         Date performed: 10/30/18  Time performed: 0944  Time walked: 6 minutes    At Rest While Awake Results    Room air at rest while awake:  SpO2 = 95%    Oxygen dose required at rest while awake: 0  lpm with resulting SpO2 95%      With Exercise Results:    Room air SpO2 with exercise, if needed: 91%    Oxygen required with exercise: 0 lpm with resulting SpO2 91%        This patient was tested on a continuous oxygen flow device.    Comments: From my observation, it does not appear that this pt will need any supplemental O2 while at rest or while moving. Pt does report some SOA while moving, but nothing that seems to impair his ability to oxygenate to a significant degree. Had a couple of minor desaturation episodes while getting up to the bathroom, but these desaturations do not sustain for more than a few seconds and he quickly recovers.       Therapist: Barnet Glasgow

## 2018-10-31 ENCOUNTER — Encounter: Admit: 2018-10-31 | Discharge: 2018-10-31

## 2018-10-31 LAB — BNP (B-TYPE NATRIURETIC PEPTI): Lab: 67 pg/mL (ref 0–100)

## 2018-10-31 LAB — C REACTIVE PROTEIN (CRP): Lab: 0.8 mg/dL (ref <1.0)

## 2018-10-31 LAB — LDH-LACTATE DEHYDROGENASE
Lab: 443 U/L — ABNORMAL HIGH (ref 100–210)
Lab: 424 U/L — ABNORMAL HIGH (ref 100–210)

## 2018-10-31 LAB — LACTIC ACID(LACTATE): Lab: 2.3 MMOL/L — ABNORMAL HIGH (ref 0.5–2.0)

## 2018-10-31 LAB — POC GLUCOSE
Lab: 133 mg/dL — ABNORMAL HIGH (ref 70–100)
Lab: 67 mg/dL — ABNORMAL LOW (ref 70–100)
Lab: 73 mg/dL (ref 70–100)
Lab: 214 mg/dL — ABNORMAL HIGH (ref 70–100)
Lab: 299 mg/dL — ABNORMAL HIGH (ref 70–100)
Lab: 259 mg/dL — ABNORMAL HIGH (ref 70–100)

## 2018-10-31 LAB — FERRITIN: Lab: 591 ng/mL — ABNORMAL HIGH (ref 30–300)

## 2018-10-31 LAB — CBC AND DIFF: Lab: 15 K/UL — ABNORMAL HIGH (ref >60)

## 2018-10-31 LAB — COMPREHENSIVE METABOLIC PANEL: Lab: 139 MMOL/L — ABNORMAL LOW (ref >60)

## 2018-10-31 LAB — SED RATE: Lab: 54 mm/h — ABNORMAL HIGH (ref 0–20)

## 2018-10-31 LAB — TROPONIN-I: Lab: 0 ng/mL (ref 0.0–0.05)

## 2018-10-31 LAB — D-DIMER: Lab: 101 ng{FEU}/mL — ABNORMAL HIGH (ref <500)

## 2018-10-31 LAB — PHOSPHORUS: Lab: 5.1 mg/dL — ABNORMAL HIGH (ref 2.0–4.5)

## 2018-10-31 LAB — MAGNESIUM: Lab: 2.8 mg/dL — ABNORMAL HIGH (ref 1.6–2.6)

## 2018-10-31 MED ORDER — SODIUM CHLORIDE 0.9 % IV SOLP
500 mL | Freq: Once | INTRAVENOUS | 0 refills | Status: CP
Start: 2018-10-31 — End: ?
  Administered 2018-10-31: 16:00:00 500 mL via INTRAVENOUS

## 2018-10-31 MED ORDER — PANTOPRAZOLE 40 MG PO TBEC
40 mg | Freq: Every day | ORAL | 0 refills | Status: DC
Start: 2018-10-31 — End: 2018-11-01
  Administered 2018-10-31 – 2018-11-01 (×2): 40 mg via ORAL

## 2018-10-31 MED ORDER — NPH INSULIN HUMAN RECOMB 100 UNIT/ML (3 ML) SC PEN
30 [IU] | Freq: Three times a day (TID) | SUBCUTANEOUS | 0 refills | Status: DC
Start: 2018-10-31 — End: 2018-11-01

## 2018-10-31 NOTE — Progress Notes
PHYSICAL THERAPY  PROGRESS NOTE        Name: Paul Benjamin        MRN: [id number]          DOB: 13-Mar-1946          Age: 73 y.o.  Admission Date: 10/22/2018             LOS: 9 days        Mobility  Progressive Mobility Level: Walk in room  Distance Walked (feet): 20 ft(x2)  Level of Assistance: Assist X1  Assistive Device: Walker  Time Tolerated: 11-30 minutes  Activity Limited By: Fatigue    Subjective  Significant hospital events: PMH significant for HTN, HLD, chronic HFpEF, DM, hypothyroidism, and restless leg syndrome who presented to OSH with AMS and hypoglycemia; found to have COVID-19. Overnight 7/28-7/29, pt became febrile with increasing oxygen requirement (O2 sat low 90s on 9 L/NC). Transferred to [facility] for worsening acute hypoxic respiratory failure in the setting of COVID-19 pneumonia. Requiring comfort flow.   Mental / Cognitive Status: Alert;Oriented;Cooperative;Follows Commands  Persons Present: Spouse(respiratory therapy )  Pain: Patient has no complaint of pain  Pain Interventions: Patient agrees to participate in therapy  Comments: pt reports had urgency of need uriate and he's wet on the depends already.    Ambulation Assist: Independent Mobility in [employer] without Device  Patient Owned Equipment: Single Journalist, newspaper  Home Situation: Lives with Family  Type of Home: House  Entry Stairs: 6-10 Stairs;Rail on 1 Side(10)  In-Home Stairs: 1-2 Flights of Stairs;Rail on 1 Side(18 steps to bedroom)      Bed Mobility/Transfer  Bed Mobility: Supine to Sit: Standby Assist;Head of Bed Elevated  Transfer Type: Sit to/from Stand  Transfer: Assistance Level: To/From;Bed;Toilet;Standby Assist  Transfer: Assistive Device: Nurse, adult  Transfers: Type Of Assistance: Materials engineer;For Safety Considerations  End Of Activity Status: Sitting at [facility] of Bed;Nursing Notified;Instructed Patient to Request Assist with Mobility;Instructed Patient to Use Call Light(RT present. )      Gait Gait Distance: 20 feet(x2)  Gait: Assistance Level: Minimal Assist  Gait: Assistive Device: Roller Walker  Gait: Descriptors: Forward trunk flexion;Swing-Through Gait;No balance loss;Normal step length  Activity Limited By: Complaint of Fatigue      Education  Persons Educated: Patient  Patient Barriers To Learning: None Noted  Teaching Methods: Verbal Instruction  Patient Response: Verbalized Understanding  Topics: Plan/Goals of PT Interventions;Mobility Progression;Safety Awareness;Up with Assist Only;Importance of Increasing Activity;Ambulate With Nursing;Recommend Continued Therapy    Assessment/Progress  Impaired Mobility Due To: Decreased Activity Tolerance;Decreased Strength;Deconditioning;Medical Status Limitation  Assessment/Progress: Improving as Expected    AM-PAC 6 Clicks Basic Mobility Inpatient  Turning from your back to your side while in a flat bed without using bed rails: None  Moving from lying on your back to sitting on the side of a flatbed without using bedrails : None  Moving to and from a bed to a chair (including a wheelchair): None  Standing up from a chair using your arms (e.g. wheelchair, or bedside chair): A Little  To walk in hospital room: A Little  Climbing 3-5 steps with a railing: A Little  Raw Score: 21  Standardized (T-scale) Score: 45.55  Basic Mobility CMS 0-100%: 29.52  CMS G Code Modifier for Basic Mobility: CJ    Goals  Goal Formulation: With Patient  Time For Goal Achievement: 5 days, To, 7 days  Patient Will Go Supine To Sit: Independently  Patient Will Go Sit to Supine :  Independently  Patient Will Transfer Bed/Chair: w/ Stand By Assist, Ongoing  Patient Will Transfer Sit to Stand: w/ Stand By Assist  Patient Will Ambulate: 101-150 Feet, w/ Dan Humphreys, w/ Stand By Assist  Patient Will Go Up / Down Stairs: 6-10 Stairs, w/ Stand By Assist    Plan  Treatment Interventions: Mobility Training;Strengthening  Plan Frequency: 5 Days per Week PT Plan for Next Visit: increase ambulation distance, continue stair training and repeated curb step     PT Discharge Recommendations  Recommendation: Home with intermittent supervision/assistance  Recommendation for Therapy Post Discharge: Home health  Patient Currently Requires Physical Assist With: Stairs;Transfers;In and out of house  Patient Currently Requires Equipment: None;Owns what is needed    Therapist: Mikal Plane, PT  Date: 10/31/2018

## 2018-10-31 NOTE — Care Plan
Plan of care reviewed with patient.and family over the phone. All questions answered. Pt encouraged to notify RN throughout night of any needs.

## 2018-10-31 NOTE — Progress Notes
Name: Paul Benjamin        MRN: [id number]          DOB: [birth year]          Age: 73 y.o.  Admission Date: 10/22/2018             LOS: 9 days        Overnight Oximetry Note    Test Start Date: 10/30/18  Test Start Time: 22:45    Did the patient require initiation of oxygen? No    SpO2 Prior to Initiation of Oxygen: 95%    No oxygen was required, and the patient remained on room air for the duration of the test.     Test Completed Date: 10/31/18  Test Completed Time: 06:11  Additional Comments:

## 2018-10-31 NOTE — Progress Notes
Pt's family called and was concerned about slurred speech today. No other neurological deficits noted and this nurse did not note slurred speech. Notified [facility] MD Dr. Sabino Gasser to inform them of their concerns. Pt resting comfortably in bed.

## 2018-10-31 NOTE — Progress Notes
Patient arrived to room # 1511 via wheelchair accompanied by RN. Patient transferred to the chair with assistance. Bedside safety checks completed. Initial patient assessment completed. Refer to flowsheet for details.    Admission skin assessment completed with: Floy Sabina, RN    Pressure injury present on arrival?: No    1. Head/Face/Neck: No  2. Trunk/Back: No  3. Upper Extremities: No  4. Lower Extremities: No  5. Pelvic/Coccyx: No  6. Assessed for device associated injury? Yes  7. Malnutrition Screening Tool (Nursing Nutrition Assessment) Completed? Yes    See Doc Flowsheet for additional wound details.     INTERVENTIONS: see kardex

## 2018-10-31 NOTE — Progress Notes
OCCUPATIONAL THERAPY    Pt seen for a follow up visit. Pt resting in bed, spouse present at bedside. Spouse reports that she is arranging for bed to be placed on the main level of the home, so pt does not need to navigate steps to the upstairs bedroom.    Spouse reports that she will be at home to assist pt as needed. They have all necessary equipment.     Pt and family do not express any additional concerns .    Recommendation: Home with intermittent supervision.    Possible weekend discharge to home planned.      Will continue to follow.    Frederik Pear, OTR/L

## 2018-10-31 NOTE — Case Management (ED)
Nurse Case Manager Weekend Needs      Instructions for CM W/E Staff:  Please fax AVS and notify [facility] on call RN (ph: [phone])of discharge. Marge Duncans [facility] fax: [phone]     Teaching Needs Prior to DC:  N/A    Expected delivery of any needed medication/supplies/equipment:  N/A    Agency Expected SOC and Services Planned:  [facility] RN, PT, OT on Sunday      Case Management Progress Note    NAME:Paul Benjamin                          MRN: [id number]              DOB:May 20, 1945          AGE: 73 y.o.  ADMISSION DATE: 10/22/2018             DAYS ADMITTED: LOS: 9 days      Today???s Date: 10/31/2018    Plan  D/c home with home health services this weekend.    Interventions  ? Support   Support: Pt/Family Updates re:POC or DC Plan  ? Info or Referral      ? Discharge Planning   Discharge Planning: Durable Medical Equipment and Supplies, Home Health     Patient completed his exercise oximetry and his overnight oximetry. Patient did not require oxygen for either test.  NCM sent signed home health orders to [facility] via Epic.  NCM called [facility] home health and spoke with Brandy, intake to confirm patient will discharge home this weekend.  Will place patient on weekend report for follow up.     ? Medication Needs                                                 ? Financial      ? Legal      ? Other   Other/None: No needs identified    Disposition  ? Expected Discharge Date    Expected Discharge Date: 11/01/18  Expected Discharge Time: 1500  ? Transportation   Does the patient need discharge transport arranged?: No  Transportation Name, Phone and Availability #1: Wife will transport   ? Next Level of Care (Acute Psych discharges only)      ? Discharge Disposition                                          Durable Medical Equipment      No service has been selected for the patient.      [place] Destination No service has been selected for the patient.      [facility]      Service Provider Request Status Selected Services Address Phone Number Fax Number    [facility] [address], [place] [address] [phone]      [facility]      No service has been selected for the patient.        Para March, [place]  Pager: [phone]  Office: [phone]

## 2018-11-01 ENCOUNTER — Encounter: Admit: 2018-10-31 | Discharge: 2018-10-31 | Admitting: Pulmonary Disease

## 2018-11-01 ENCOUNTER — Ambulatory Visit
Admit: 2018-10-22 | Discharge: 2018-11-01 | Disposition: A | Discharge status: Home Health | Source: Other Acute Inpatient Hospital | Admitting: Pulmonary Disease

## 2018-11-01 ENCOUNTER — Encounter: Admit: 2018-10-25 | Discharge: 2018-10-25 | Admitting: Pulmonary Disease

## 2018-11-01 ENCOUNTER — Encounter: Admit: 2018-10-24 | Discharge: 2018-10-24 | Admitting: Pulmonary Disease

## 2018-11-01 ENCOUNTER — Encounter: Admit: 2018-10-22 | Discharge: 2018-10-22 | Admitting: Pulmonary Disease

## 2018-11-01 ENCOUNTER — Encounter: Admit: 2018-11-01 | Discharge: 2018-11-01

## 2018-11-01 DIAGNOSIS — Z881 Allergy status to other antibiotic agents status: Secondary | ICD-10-CM

## 2018-11-01 DIAGNOSIS — I495 Sick sinus syndrome: Secondary | ICD-10-CM

## 2018-11-01 DIAGNOSIS — I5032 Chronic diastolic (congestive) heart failure: Secondary | ICD-10-CM

## 2018-11-01 DIAGNOSIS — U071 COVID-19: Secondary | ICD-10-CM

## 2018-11-01 DIAGNOSIS — Z87891 Personal history of nicotine dependence: Secondary | ICD-10-CM

## 2018-11-01 DIAGNOSIS — E039 Hypothyroidism, unspecified: Secondary | ICD-10-CM

## 2018-11-01 DIAGNOSIS — J1289 Other viral pneumonia: Secondary | ICD-10-CM

## 2018-11-01 DIAGNOSIS — Z6833 Body mass index (BMI) 33.0-33.9, adult: Secondary | ICD-10-CM

## 2018-11-01 DIAGNOSIS — R6520 Severe sepsis without septic shock: Secondary | ICD-10-CM

## 2018-11-01 DIAGNOSIS — Z882 Allergy status to sulfonamides status: Secondary | ICD-10-CM

## 2018-11-01 DIAGNOSIS — E669 Obesity, unspecified: Secondary | ICD-10-CM

## 2018-11-01 DIAGNOSIS — E1165 Type 2 diabetes mellitus with hyperglycemia: Secondary | ICD-10-CM

## 2018-11-01 DIAGNOSIS — E876 Hypokalemia: Secondary | ICD-10-CM

## 2018-11-01 DIAGNOSIS — E782 Mixed hyperlipidemia: Secondary | ICD-10-CM

## 2018-11-01 DIAGNOSIS — Q231 Congenital insufficiency of aortic valve: Secondary | ICD-10-CM

## 2018-11-01 DIAGNOSIS — E1122 Type 2 diabetes mellitus with diabetic chronic kidney disease: Secondary | ICD-10-CM

## 2018-11-01 DIAGNOSIS — Z79890 Hormone replacement therapy (postmenopausal): Secondary | ICD-10-CM

## 2018-11-01 DIAGNOSIS — Z950 Presence of cardiac pacemaker: Secondary | ICD-10-CM

## 2018-11-01 DIAGNOSIS — R5381 Other malaise: Secondary | ICD-10-CM

## 2018-11-01 DIAGNOSIS — G4761 Periodic limb movement disorder: Secondary | ICD-10-CM

## 2018-11-01 DIAGNOSIS — K219 Gastro-esophageal reflux disease without esophagitis: Secondary | ICD-10-CM

## 2018-11-01 DIAGNOSIS — D649 Anemia, unspecified: Secondary | ICD-10-CM

## 2018-11-01 DIAGNOSIS — I480 Paroxysmal atrial fibrillation: Secondary | ICD-10-CM

## 2018-11-01 DIAGNOSIS — G4733 Obstructive sleep apnea (adult) (pediatric): Secondary | ICD-10-CM

## 2018-11-01 DIAGNOSIS — Z955 Presence of coronary angioplasty implant and graft: Secondary | ICD-10-CM

## 2018-11-01 DIAGNOSIS — Z006 Encounter for examination for normal comparison and control in clinical research program: Secondary | ICD-10-CM

## 2018-11-01 DIAGNOSIS — I878 Other specified disorders of veins: Secondary | ICD-10-CM

## 2018-11-01 DIAGNOSIS — I2510 Atherosclerotic heart disease of native coronary artery without angina pectoris: Secondary | ICD-10-CM

## 2018-11-01 DIAGNOSIS — Z888 Allergy status to other drugs, medicaments and biological substances status: Secondary | ICD-10-CM

## 2018-11-01 DIAGNOSIS — Z794 Long term (current) use of insulin: Secondary | ICD-10-CM

## 2018-11-01 DIAGNOSIS — J9601 Acute respiratory failure with hypoxia: Secondary | ICD-10-CM

## 2018-11-01 DIAGNOSIS — I130 Hypertensive heart and chronic kidney disease with heart failure and stage 1 through stage 4 chronic kidney disease, or unspecified chronic kidney disease: Secondary | ICD-10-CM

## 2018-11-01 DIAGNOSIS — N183 Chronic kidney disease, stage 3 (moderate): Secondary | ICD-10-CM

## 2018-11-01 DIAGNOSIS — Z79899 Other long term (current) drug therapy: Secondary | ICD-10-CM

## 2018-11-01 DIAGNOSIS — E872 Acidosis: Secondary | ICD-10-CM

## 2018-11-01 DIAGNOSIS — G2581 Restless legs syndrome: Secondary | ICD-10-CM

## 2018-11-01 DIAGNOSIS — A4189 Other specified sepsis: Principal | ICD-10-CM

## 2018-11-01 DIAGNOSIS — E11649 Type 2 diabetes mellitus with hypoglycemia without coma: Secondary | ICD-10-CM

## 2018-11-01 LAB — MAGNESIUM: Lab: 2.7 mg/dL — ABNORMAL HIGH (ref >60)

## 2018-11-01 LAB — COMPREHENSIVE METABOLIC PANEL
Lab: 0.5 mg/dL — ABNORMAL LOW (ref 0.3–1.2)
Lab: 143 MMOL/L — ABNORMAL LOW (ref >60)
Lab: 8.4 mg/dL — ABNORMAL LOW (ref >60)

## 2018-11-01 LAB — CBC AND DIFF: Lab: 12 K/UL — ABNORMAL HIGH (ref >60)

## 2018-11-01 LAB — PHOSPHORUS: Lab: 5.4 mg/dL — ABNORMAL HIGH (ref 2.0–4.5)

## 2018-11-01 LAB — POC GLUCOSE
Lab: 169 mg/dL — ABNORMAL HIGH (ref 70–100)
Lab: 145 mg/dL — ABNORMAL HIGH (ref 70–100)
Lab: 65 mg/dL — ABNORMAL LOW (ref 70–100)

## 2018-11-01 MED ORDER — NPH INSULIN HUMAN RECOMB 100 UNIT/ML (3 ML) SC PEN
25 [IU] | Freq: Three times a day (TID) | SUBCUTANEOUS | 0 refills | Status: DC
Start: 2018-11-01 — End: 2018-11-01

## 2018-11-01 MED ORDER — INSULIN ASPART 100 UNIT/ML SC FLEXPEN
12 [IU] | Freq: Three times a day (TID) | SUBCUTANEOUS | 0 refills | Status: DC
Start: 2018-11-01 — End: 2018-11-01

## 2018-11-01 MED ORDER — ERGOCALCIFEROL (VITAMIN D2) 1,250 MCG (50,000 UNIT) PO CAP
1 | ORAL_CAPSULE | ORAL | 0 refills | 56.00000 days | Status: AC
Start: 2018-11-01 — End: ?
  Filled 2018-11-01: qty 11, 77d supply, fill #1

## 2018-11-01 NOTE — Progress Notes
Day of Discharge Note    Day of discharge progress note for [facility]    Chart data reviewed including medications, consultation notes, lab, vitals, imaging.  Patient was seen and examined with pertinent information listed below.    Subjective: No events reported overnight. The patient notes that he is feeling well today other than his hiccups. Wife at bedside. He notes he feels much better and feels ready to go home. He denies dizziness, f/c, CP, SOA, n/v, abdominal pain. He notes having regular bowel movements and denies difficulty urinating. He notes eating a drinking well. He is able to walk with a walker and notes he has all supplies needed at home. We discussed return precautions.    Exam:   General:  Alert, cooperative, no distress, appears stated age  Head:  Normocephalic, without obvious abnormality, atraumatic  Eyes:  Conjunctivae/corneas clear.  PERRL, EOMs intact.   Neck:    Supple, symmetrical, trachea midline  Lungs:  Clear to auscultation bilaterally  Heart:   Regular rate and rhythm, S1, S2 normal, no murmur, click rub or gallop  Abdomen:  Soft, non-tender. Bowel sounds normal.   Extremities: Extremities normal, atraumatic, no cyanosis, trace b/l LE edema with chronic discoloration likely from stasis changes.  Peripheral pulses: 2+ and symmetric, all extremities  Skin: Skin color, texture, turgor normal. Healed blisters on LLE.  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  - Discharged on his previous insulin regimen and advised to monitor his glucose at home.    Patient feels comfortable with plans for discharge. All questions were answered.  Discharge discussion, including follow up/discharge instructions, occurred with patient face-to-face.      Steffanie Dunn, DO  11/01/2018 Discharge Planning: greater than 31 minutes spent in pt dc care today spent counseling pt, coordinating dc care, placing dc orders and helping complete dc summary.

## 2018-11-01 NOTE — Progress Notes
Patient discharged, discharge instructions given, peripheral iv removed, patient went by wheelchair.

## 2018-11-01 NOTE — Care Plan
Patient updated on plan of care and verbalizes understanding.

## 2018-11-01 NOTE — Progress Notes
RT Adult Assessment Note    NAME:Paul Benjamin             MRN: [id number]             DOB:05-Dec-1945          AGE: 73 y.o.  ADMISSION DATE: 10/22/2018             DAYS ADMITTED: LOS: 10 days    RT Treatment Plan:  Protocol Plan: Medications  Combivent Respimat: MDI Q4h While Awake & PRN  Symbicort (Home regimen only): BID    Protocol Plan: Procedures  PAP: Place a nursing order for "IS Q1h While Awake" for any of Lung Expansion indicators  Oxygen/Humidity: O2 to keep SpO2 > 92%, if not on any RT modality, D/C protocol if greater than 24 hours on room air  SpO2: Continuous (Document SpO2 result Qshift)(OSA)    Additional Comments:  Impressions of the patient: Pt is alert and cooperative. No apparent signs is respiratory distress.  Intervention(s)/outcome(s): RT Eval  Patient education that was completed: N/A  Recommendations to the care team: Continue treatment per RT protocol    Vital Signs:  Pulse: 70  RR: 18 PER MINUTE  SpO2: 96 %  :  Respiratory Effort: Non-Labored

## 2018-11-01 NOTE — Case Management (ED)
Case Management Progress Note    NAME:Paul Benjamin                          MRN: [id number]              DOB:09-16-1945          AGE: 73 y.o.  ADMISSION DATE: 10/22/2018             DAYS ADMITTED: LOS: 10 days      Todays Date: 11/01/2018    Plan  DC today    Interventions  ? Support   Support: Pt/Family Updates re:POC or DC Plan  ? Info or Referral      ? Discharge Planning   Discharge Planning: Durable Medical Equipment and Supplies, [place] [facility] of dc today, faxed AVS    ? Medication Needs                                                 ? Financial      ? Legal      ? Other   Other/None: No needs identified    Disposition  ? Expected Discharge Date    Expected Discharge Date: 11/01/18  Expected Discharge Time: 1300  ? Transportation   Does the patient need discharge transport arranged?: No  Transportation Name, Phone and Availability #1: Wife will transport   ? Next Level of Care (Acute Psych discharges only)      ? Discharge Disposition                                          Durable Medical Equipment      No service has been selected for the patient.      [place] Destination      No service has been selected for the patient.      [place] - Selection Complete      Service Provider Request Status Selected Services Address Phone Number Fax Number    [address], [place] [address] [phone] [facility]      No service has been selected for the patient.        Doran Durand BSN, RN  PRN Nurse Case Pharmacologist: [phone]

## 2018-11-10 ENCOUNTER — Encounter: Admit: 2018-11-10 | Discharge: 2018-11-10 | Payer: MEDICARE

## 2018-11-10 ENCOUNTER — Ambulatory Visit: Admit: 2018-11-10 | Discharge: 2018-11-10 | Payer: MEDICARE

## 2018-11-10 DIAGNOSIS — Z950 Presence of cardiac pacemaker: Secondary | ICD-10-CM

## 2018-11-20 ENCOUNTER — Encounter: Admit: 2018-11-20 | Discharge: 2018-11-20

## 2018-11-20 DIAGNOSIS — I495 Sick sinus syndrome: Secondary | ICD-10-CM

## 2018-11-20 DIAGNOSIS — I10 Essential (primary) hypertension: Secondary | ICD-10-CM

## 2018-11-20 DIAGNOSIS — E1165 Type 2 diabetes mellitus with hyperglycemia: Principal | ICD-10-CM

## 2018-11-20 DIAGNOSIS — E7800 Pure hypercholesterolemia, unspecified: Secondary | ICD-10-CM

## 2018-11-20 DIAGNOSIS — E119 Type 2 diabetes mellitus without complications: Secondary | ICD-10-CM

## 2018-11-20 DIAGNOSIS — I4891 Unspecified atrial fibrillation: Secondary | ICD-10-CM

## 2018-11-20 DIAGNOSIS — G4730 Sleep apnea, unspecified: Secondary | ICD-10-CM

## 2018-11-20 DIAGNOSIS — I2510 Atherosclerotic heart disease of native coronary artery without angina pectoris: Secondary | ICD-10-CM

## 2018-11-20 MED ORDER — JARDIANCE 10 MG PO TAB
10 mg | ORAL_TABLET | Freq: Every day | ORAL | 11 refills | Status: DC
Start: 2018-11-20 — End: 2018-12-11

## 2018-11-20 NOTE — Patient Instructions
It was nice to see you today!    Goals we discussed today:     Start Jardiance 10 mg every morning, make sure you drink your allowed amount of water every day.  This medicine tells your kidneys to stop reabsorbing sugar and you pee out your high blood sugar    Increase Levemir to 35 units, every morning    For glucose of ________, take________ units of Novolog with meals, 4 hours apart.            90-150  6 units                        151-200                          8 units                                201-250                  10 units                        251-300                         12 units                        301-350                   14 units                        351-400                      16 units                        401 or more               18 units          Please contact [facility] for any questions or abnormal glucose values (above 300 or less than 70 repeatedly).  [phone]   My nurse, Melvenia Beam, can be reached at [phone]      Helpful Resources:  Books  - Bright Spots and [facility] by Denice Bors, free online download, paper back $6.42, kindle $1.99  - Diabetes Friendly Table (cookbook) by [employer] [place] and Marion MS RD CDE, Victorino Dike    Online:  - StickerEmporium.com.ee, learn about what's new in diabetes, sign up for their weekly email  - Diabetes.org, the American Diabetes Association's website is full of great information and resources  - AffordableSalon.es, online cooking class, options $10/ month or $50/ 60 day course      Health goals for a person with diabetes to avoid complications are:    Check your FingerStick blood glucose:  Each time you take medications for your glucose, goals are:   Fasting and pre-meal glucose:  70-130 mg/dl   Bedtime blood glucose: 90-180 mg/dl    LDL Cholesterol:  <295 mg/dl   Triglycerides: <621 mg/dl   HDL >30 for men, >86 for women   Blood pressure:  Less than 130/90 mm HG Maintain a Healthy Weight ,  Exercise: 30 minutes 5 times per week    Foot care:  Take good care of your feet.  Never cut your nails close to the tips of the toes. Promptly call your doctor if you have an infection, ulcer or cut anywhere on your feet. Wear shoes with a wide toe box and that fit comfortably.  Avoid walking barefoot.     Your recent lab values:    Glucose, POC   Date/Time Value Ref Range Status   11/01/2018 08:28 AM 145 (H) 70 - 100 MG/DL Final     Cholesterol   Date/Time Value Ref Range Status   08/28/2017 171  Final     HDL   Date/Time Value Ref Range Status   08/28/2017 37  Final     LDL   Date/Time Value Ref Range Status   08/28/2017 99  Final     Triglycerides   Date/Time Value Ref Range Status   08/28/2017 211 (H) 30 - 200 Final          Diabetes Support Group  Monthly DM Support Group facilitated by CDE. There is no fee or  Registration required for attendance. Meets the first Wednesday of each month from 6-7:30pm at the [facility], [address], [place], [place]  [address].    [facility] Classes and Education Opportunities    To schedule Diabetes Education please call [phone], Option 3  Contact your insurance company to determine coverage for diabetes education.

## 2018-11-20 NOTE — Progress Notes
Telehealth Visit Note    Date of Service: 11/20/2018    Subjective:      Obtained patient's verbal consent to treat them and their agreement to [facility] financial policy and NPP via this telehealth visit during the [facility]       Paul Benjamin is a 73 y.o. male. He is here today with his daughter: Paul Benjamin and wife: Paul Benjamin.    History of Present Illness    The patient presents to the [facility] at [facility] for evaluation and management of diabetes mellitus via tele health: ZOOM. He was referred to [place] by his PCP, Dr. [name].    Type 2 Diabetes mellitus  Dx: ~2010     Last A1c was 8.2% in March 2020, in July was possibly in the 7s    Current DM regimen: Novolog 4 units for every 40 > 140, and Levemir 30 units every morning  Past treatment: Glimepiride- stopped during hospitalization   Adherence to medications: all the time  FSBG frequency: 4 times daily  Hyperglycemia: Global  Hypoglycemia: no  Hypoglycemia unawareness?: no  Meals per day / Carb intake: 3 and occasional snack  Exercise: Home Health PT    Complications of DM:  ??? CAD: YES, with ablation and stenting, and pacemaker, HF with preserved EF  ??? CVA: No  ??? PVD: No  ??? Amputations: No  ??? Retinopathy: No  ??? Gastropathy: No  ??? Nephropathy: YES  ??? Neuropathy: No  ??? Depression: No  ??? Chronic wounds / delayed healing: YES, has cellulitis  ??? DM related hospitalizations: No    Last dilated eye exam: Sept 2019  Last dental exam:   Last DM education / nutritionist visit:     DM related medications:  Statin: Yes, simvastatin 20 mg  ACE-I: No  ASA: No      The patient reports self blood glucose monitoring values.  Fasting: 188, 265, 276, 247, 289, 259  Lunch: 332, 232, 373, 355, 382  Dinner: 238, 202, 390, 445, 566  Bedtime: 260, 257, 318, 405, 364           Review of Systems  14 point review of systems was preformed and was positive for: SOB      Objective:         ??? allopurinol (ZYLOPRIM) 100 mg tablet Take 100 mg by mouth Twice Daily. ??? ascorbic acid (VITAMIN C) 1,000 mg tablet Take  by mouth.   ??? calcium carbonate (TUMS) 500 mg (200 mg elemental calcium) chewable tablet Chew 500 mg by mouth as Needed. Indications: heartburn   ??? carvediloL (COREG) 25 mg tablet Take one tablet by mouth twice daily. Take with food.   ??? chlorthalidone (HYGROTON) 25 mg tablet Take one tablet by mouth daily.   ??? clindamycin (CLEOCIN) 300 mg capsule Take 300 mg by mouth every 8 hours. Take with 8oz of water.   ??? docusate (COLACE) 100 mg capsule Take 100 mg by mouth twice daily.   ??? ergocalciferol (VITAMIN D-2) 1,250 mcg (50,000 unit) capsule Take one capsule by mouth every 7 days.   ??? furosemide (LASIX) 80 mg tablet Take one tablet by mouth every 48 hours. OK TO TAKE EXTRA DOSE IN AFTERNOON IF WEIGHT ABOVE 272 POUNDS  Indications: visible water retention   ??? insulin aspart U-100 (NOVOLOG FLEXPEN) 100 unit/mL (3 mL) injection PEN Inject 8 Units under the skin three times daily with meals. As needed    ??? LEVEMIR FLEXTOUCH U-100 INSULN  100 unit/mL (3 mL) injection pen Inject 30 Units under the skin twice daily.   ??? levothyroxine (SYNTHROID) 75 mcg tablet Take 75 mcg by mouth daily.   ??? nitroglycerin (NITROSTAT) 0.4 mg tablet Place 1 tablet under tongue every 5 minutes as needed for Chest Pain.   ??? potassium chloride SR (K-DUR) 20 mEq tablet Take 20 mEq by mouth daily. Take with a meal and a full glass of water.   ??? pramipexole (MIRAPEX) 1.5 mg PO Tab Take 1.5 mg by mouth at bedtime daily.   ??? simvastatin (ZOCOR) 20 mg tablet Take 20 mg by mouth at bedtime daily.     There were no vitals filed for this visit.  There is no height or weight on file to calculate BMI.     Telehealth Patient Reported Vitals     Row Name 11/20/18 1431                Weight:  108.9 kg (240 lb)        Height:  180.3 cm (70.98)        Pain Score:  Zero              Physical Exam  Constitutional:       Appearance: Normal appearance.   HENT:      Head: Normocephalic and atraumatic.   Neck: Musculoskeletal: Normal range of motion.   Pulmonary:      Comments: dyspneic    Neurological:      Mental Status: He is alert and oriented to person, place, and time.   Psychiatric:         Mood and Affect: Mood normal.         Behavior: Behavior normal.              Assessment and Plan:  Diabetes mellitus type 2,  uncontrolled   ??? Last A1c 8.2% in March 2020   ??? Target A1c < 8% without hypoglycemia, with co-morbidities  ??? Currently on  Novolog 4 units for every 40 > 140, and Levemir 30 units every morning   ??? Complicated by: Nephropathy and CAD    Plan:  ??? Patient with global hyperglycemia  ??? He would benefit from a more cardioprotective regimen such as an SGLT2 and/or GLP-1  ??? Reviewed mechanism of action of each of these drug classes.  At this time I feel he would benefit the most from an SGLT2 given his heart failure.  GFR remains above 30, last GFR: 32 November 01, 2018.  ??? Start Jardiance 10 mg daily, recheck CMP in 2 weeks  ??? Change NovoLog to 6 units with meals, will possibly need more with meals but I do not want to cause him any hypoglycemia  ??? Change correction scale to 2 units for every 50 > 150  ??? Increase Levemir to 35 units daily  ??? At his next visit if GFR has remained stable will start GLP-1 and begin weaning off the insulin.  ??? He is not a candidate for metformin due to renal function, or Actos due to heart failure  ??? He is checking his blood sugar 4 times daily, Medicare only covers 3 checks per day so pays out-of-pocket for more testing supplies each month.  He would greatly benefit from continuous glucose monitoring, discussed Dexcom versus freestyle libre.  Patient would like to trial a freestyle libre, I will see if it is possible to send him a sample kit.    Diabetic complication assessment:   ???  Annual Dilated eye exam: Due September 2020, has upcoming appointment  ??? Annual labs (electrolytes and renal function): Reviewed, last done November 01, 2018, update before next visit ??? Annual Urine microalbumin/Cr: N/A CKD  ??? Foot exam / monofilament exam (recommended annually): Due at next in person visit    Supportive care for diabetes:  ??? Diabetic Educator (recommended annually): Consider in the future  ??? Nutritionist:     Lipids: Hyperlipidemia    ??? Recommendations for Statin treatment in diabetes:   ??? 80-75 yo: No risk factors - moderate dose statin.  CVD risk factors or overt CVD - high dose statin.    ??? CVD risk factors include LDL cholesterol ?100 mg/dL (2.6 mmol/L), high blood pressure, and overweight   ??? Currently on a statin: Simvastatin 20 mg daily  ??? Annual fasting lipid panel due: June 2020, has upcoming visit with PCP    Blood pressure: Hypertension   ??? Controlled   ??? ACE-I or ARB: No      RTC 4 weeks with me                             60 minutes spent on this patient's encounter with counseling and coordination of care taking >50% of the visit.

## 2018-11-21 ENCOUNTER — Encounter: Admit: 2018-11-21 | Discharge: 2018-11-21

## 2018-11-21 ENCOUNTER — Ambulatory Visit: Admit: 2018-11-20 | Discharge: 2018-11-21

## 2018-12-05 ENCOUNTER — Encounter: Admit: 2018-12-05 | Discharge: 2018-12-05

## 2018-12-09 ENCOUNTER — Ambulatory Visit: Admit: 2018-12-09 | Discharge: 2018-12-10 | Payer: MEDICARE

## 2018-12-09 ENCOUNTER — Encounter: Admit: 2018-12-09 | Discharge: 2018-12-09 | Payer: MEDICARE

## 2018-12-09 NOTE — Patient Instructions
1. Med changes today:  None, OK to take extra afternoon lasix up to one day a week if swelling or breathing gets worse, but let us know if you need it more frequently than that.  2. Please write down your weight, blood pressure, and heart rate daily and bring this log to all clinic visits.  3. Return to see Dr. Adah Perl in 5 months.    Please call the office with any questions or concerns [phone] (nurse triage).  To schedule or change an appointment call [phone].    Lise Auer, MD, PhD

## 2018-12-09 NOTE — Progress Notes
Date of Service: 12/09/2018    Paul Benjamin is a 73 y.o. male.       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 He also has stage 4 CKD with creatinine around 2.  We previously increased?his Lasix to 80 mg twice daily for approximately 10 pounds volume overload, and he is now back to taking it once a day.      Earlier this summer he had COVID and was in the hospital [facility] medical ICU) for COVID-19 pneumonia with hypoxic respiratory failure and severe sepsis.  He finished a round of antibiotics (First vancomycin and Zosyn followed by doxycycline) and was treated with Decadron, convalescent plasma, and Remdesevir.  His d-dimer was elevated and he was placed on Lovenox.  He had difficulty controlled diabetes given the steroid use and required insulin drip.   He did eventually require 4 L of oxygen by nasal cannula after leaving the MICU, but continuous oxygen was weaned prior to discharge.  He is now home living with his wife with home health to help with things like bathing and dysphagia/swallowing which is improving and not needing supplemental oxygen.  Today he reports?he continues to have dyspnea exertion with just about every activity, though this is improving since his COVID infection, and he is no longer on oxygen at home even with exertion.  He is otherwise without cardiac complaints other than his edema and cellulitis which comes and goes with blisters on the left leg that are slowly healing.  He currently denies exertional chest pain, irregular heartbeat/palpitations, dizzy spells, syncope/near syncope, orthopnea, and paroxysmal nocturnal dyspnea.  He wears CPAP.  His home blood pressure is in range, though he didn't bring log.  He has lost 25 pounds since his COVID with difficulty swallowing improving, though appetite is slow to improve.    His recent chest x-ray on 11/02/2018 following his COVID discharge revealed hyperinflation and chronic interstitial changes without evidence of consolidation.  He has a stable enlarged cardiac silhouette with dual-chamber pacemaker.  Recent lab work was collected 12/05/2018 at [facility].  CBC: WBC 6.8, hemoglobin 10.1, hematocrit 31.1, MCV 89, platelets 210.  Chemistry: Sodium 144, potassium 4.4, chloride 109, bicarb 20, anion gap 19, BUN 44, creatinine 1.97, glucose 138, A1c 8.7%, calcium 9.6.    05/06/2018 Echo  ? LVEF=75%, Limited Visualization  ? Mild Concentric LVH  ? Mild Left Atrial Dilatation  ? Mild Right Ventricular And Right Atrial Dilatation  ? Likely Bicuspid Aortic Valve Sclerosis With Mildly Restricted Motion, Without Stenosis. Peak Velocity=1.19m/sec  ? Pacer Wire In Right Heart Chambers  ? No Pericardial Effusion  ? PASP=43mmHg  ? Normal TDI S' Velocity=0.156m/sec  ? Normal TAPSE=3.7cm         Vitals:    12/09/18 0753   BP: 98/62   BP Source: Arm, Left Upper   Pulse: 75   Temp: 36.8 ?C (98.2 ?F)   SpO2: 97%   Weight: 107.5 kg (237 lb)   Height: 1.803 m (5' 11)   PainSc: Zero     Body mass index is 33.05 kg/m?Marland Kitchen     Past Medical History  Patient Active Problem List    Diagnosis Date Noted   ? Severe sepsis (HCC) 11/10/2018   ? Acute respiratory failure with hypoxia (HCC) 10/24/2018   ? Hyperglycemia 10/24/2018   ? Pneumonia due to COVID-19 virus 10/22/2018   ? 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 Preoperative clearance 06/26/2013   ? Presence of permanent cardiac pacemaker 10/04/2011 ? PLMD (periodic limb movement disorder) 09/29/2008   ? 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 SSS (sick sinus syndrome) (HCC)      09/30/06 Dual Chamber PPM Initial ImplantMedtronic generator model #ADDR01 Medtronic atrial lead model #4076, Medtronic RV lead model #4076,      ? A-fib (HCC) 09/30/2006     Brief episode of atrial fibrillation after the stenting.         a.  Recurrent bouts of atrial fibrillation but completely asymptomatic.  3/09:  Hospital admission for sotalol initiation and cardioversion  09/23/07 A fib ablation:  Pulmonary vein antral isolation, ligament of marshall ablation, posterior roof line, cavotricuspid isthmus ablation  6/09 event monitor:  No recurrent AF     ? 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  Implanted device is a MDT Adapta [id number]     ? CAD (coronary artery disease) 09/30/2006     01/21/04 Heart catheterization - Angioplasty and stenting of the anterior descending coronary artery on 01/21/04 with a Cypher drug-eluting stent used for the LAD. The right coronary and circumflex showed only minimal disease. The ejection fraction was 45-50%.   07/18/2004:  Thallium showing EF of 53% with minimal amount of ischemia involving the               inferolateral segment.  3/09 adenosine thallium stress:  This study suggests a low likelihood for ischemic/jeopardized myocardium. No definite fixed or reversible defects are evident. High risk scintigraphic features are absent. Although the calculated LV ejection fraction was reduced, by visual estimation, the EF would appear at the lower limits of normal. The left ventricle is within normal limits for size.     7/11 thallium:  SUMMARY/OPINION: This study is probably normal. There is a small defect involving the inferior wall. The majority of the defect is fixed while more distally there is a subtle degree of reversibility. Overall I think this probably represents diaphragmatic attenuation though a very slight degree of ischemia cannot be completely excluded. The left ventricular systolic function is normal with a calculated ejection fraction of 61 percent. The end diastolic volume is normal at 100 mL. The pulmonary to myocardial count ratio is normal at 0.36. There is no transient ischemic dilatation.   This study was compared to a myocardial perfusion study obtained in our office on May 26, 2007. The prior study demonstrated an EF of 43 percent though visually it was felt to be in more of the 50-55 percent range. The prior study also demonstrated a perfusion abnormality involving the inferior wall which had some mild reversibility but overall was felt to be due to diaphragmatic attenuation.   In comparing the two studies qualitatively, both demonstrated an inferior wall defect. The prior study had more reversibility involving the basal to mid section while the current study has some mild reversibility involving the distal section. Overall I think both studies probably represent diaphragmatic attenuation.     05/03/2015 - Stress Test:  ? This study is probably normal with inferior attenuation with a small sized mild intensity  reversible defect in the basal inferior wall consistent with? diaphragmatic attenuation. Left ventricular systolic function is normal. There are no high risk prognostic indicators present.? The ECG portion of the study is negative for ischemia.         ? Hypertension 09/30/2006   ? Mixed dyslipidemia 09/30/2006   ? Sleep apnea 09/30/2006         Review of Systems   Constitution: Positive for weight loss.   HENT: Negative.    Eyes: Negative.    Cardiovascular: Positive for dyspnea on exertion.   Respiratory: Negative.    Endocrine: Negative.    Hematologic/Lymphatic: Bruises/bleeds easily.   Skin: Negative.    Musculoskeletal: Negative.    Gastrointestinal: Negative.    Genitourinary: Negative.    Neurological: Positive for excessive daytime sleepiness. Psychiatric/Behavioral: Negative.    Allergic/Immunologic: Negative.        Physical Exam  Constitutional: He appears well-developed and well-nourished.   HENT:  Head: Normocephalic.   Mouth/Throat: Oropharynx is clear and moist.   Eyes: Conjunctivae are normal.   Neck: Normal range of motion. No JVD present. Normal carotid pulses.  Cardiovascular: Normal rate, regular rhythm, normal heart sounds and intact distal pulses. Exam reveals no gallop and no friction rub.  No murmur heard.  Pulmonary/Chest: Effort normal and breath sounds normal. No respiratory distress. He has no wheezes. He has no rales. He exhibits no chest wall tenderness.   Abdominal: Soft. Bowel sounds are normal. He exhibits no distension. There is no tenderness.   Musculoskeletal: Normal range of motion and muscular tone. He exhibits 1+ edema with changes of venous insufficiency/hyperpigmentation.   Neurological: He is alert and oriented to person, place, and time. No focal deficits.  Skin: Skin is warm. No erythema.   Psychiatric: He has a normal mood and affect. Judgment, behavior, and thought content normal.       Cardiovascular Studies      Problems Addressed Today  Encounter Diagnoses   Name Primary?   ? Coronary artery disease involving native coronary artery of native heart without angina pectoris Yes   ? Essential hypertension    ? Chronic heart failure with preserved ejection fraction (HFpEF) (HCC)    ? SSS (sick sinus syndrome) (HCC)        Assessment and Plan       Today he is warm, euvolemic, normotensive, and with stable NYHA functional class III symptoms of diastolic heart failure, complicated by recent COVID-19 pneumonia requiring ICU care.  I will make no medication changes at this time, though reminded him it is okay to take an extra Lasix in the afternoon up to 1 day a week if he has worsening swelling or volume overload symptoms.  He should call the clinic if he needs it more frequently than 1 extra dose per week.  We will watch his weight and blood pressure trend closely and see him back in 5 months.  I will plan on repeating echocardiogram in 2021 to reassess his sclerotic aortic valve.    His recent device check was a remote check on November 10, 2018.  He has 7 years of battery life remaining with no significant arrhythmias or pacemaker concerns noted and no alerts to report.     He has stable coronary disease and will continue statin therapy and nitro, and we will reassess lipids prior to next visit.  He has no angina, and I will consider augmentation to high intensity statin resuming aspirin at next visit.  Thank you for allowing me to participate in the care of this patient.  Please do not hesitate to contact me should you have any questions or concerns.    Baldo Ash, MD, PhD  Advanced Heart Failure and Transplant Cardiology  Pager [phone]       I spent 25 minutes with the patient today including approximately 15 minutes in counseling on his heart disease.  We reviewed the above treatment plan, went over risks/benefits/alternatives to the therapies, and all questions were answered to his satisfaction.    Current Medications (including today's revisions)  ? allopurinol (ZYLOPRIM) 100 mg tablet Take 100 mg by mouth Twice Daily.   ? ascorbic acid (VITAMIN C) 1,000 mg tablet Take  by mouth.   ? calcium carbonate (TUMS) 500 mg (200 mg elemental calcium) chewable tablet Chew 500 mg by mouth as Needed. Indications: heartburn   ? carvediloL (COREG) 25 mg tablet Take one tablet by mouth twice daily. Take with food.   ? chlorthalidone (HYGROTON) 25 mg tablet Take one tablet by mouth daily.   ? clindamycin (CLEOCIN) 300 mg capsule Take 300 mg by mouth every 8 hours. Take with 8oz of water.   ? docusate (COLACE) 100 mg capsule Take 100 mg by mouth twice daily.   ? empagliflozin (JARDIANCE) 10 mg tablet Take one tablet by mouth daily. ? ergocalciferol (VITAMIN D-2) 1,250 mcg (50,000 unit) capsule Take one capsule by mouth every 7 days.   ? furosemide (LASIX) 80 mg tablet Take one tablet by mouth every 48 hours. OK TO TAKE EXTRA DOSE IN AFTERNOON IF WEIGHT ABOVE 272 POUNDS  Indications: visible water retention (Patient taking differently: Take 80 mg by mouth every morning. OK TO TAKE EXTRA DOSE IN AFTERNOON IF WEIGHT ABOVE 272 POUNDS  Indications: visible water retention)   ? insulin aspart U-100 (NOVOLOG FLEXPEN) 100 unit/mL (3 mL) injection PEN Inject 8 Units under the skin three times daily with meals. As needed    ? LEVEMIR FLEXTOUCH U-100 INSULN 100 unit/mL (3 mL) injection pen Inject 30 Units under the skin every morning.   ? levothyroxine (SYNTHROID) 75 mcg tablet Take 75 mcg by mouth daily.   ? nitroglycerin (NITROSTAT) 0.4 mg tablet Place 1 tablet under tongue every 5 minutes as needed for Chest Pain.   ? potassium chloride SR (K-DUR) 20 mEq tablet Take 20 mEq by mouth daily. Take with a meal and a full glass of water.   ? pramipexole (MIRAPEX) 1.5 mg PO Tab Take 1.5 mg by mouth at bedtime daily.   ? simvastatin (ZOCOR) 20 mg tablet Take 20 mg by mouth at bedtime daily.

## 2018-12-10 ENCOUNTER — Encounter: Admit: 2018-12-10 | Discharge: 2018-12-10 | Payer: MEDICARE

## 2018-12-11 ENCOUNTER — Encounter: Admit: 2018-12-11 | Discharge: 2018-12-11 | Payer: MEDICARE

## 2018-12-15 ENCOUNTER — Encounter: Admit: 2018-12-15 | Discharge: 2018-12-15 | Payer: MEDICARE

## 2018-12-15 NOTE — Telephone Encounter
Patient's wife states, "Desman can't afford that new medicine.  It costs $238 dollars".Marland Kitchen

## 2019-01-08 ENCOUNTER — Encounter: Admit: 2019-01-08 | Discharge: 2019-01-08 | Payer: MEDICARE

## 2019-01-08 ENCOUNTER — Ambulatory Visit: Admit: 2019-01-08 | Discharge: 2019-01-09 | Payer: MEDICARE

## 2019-01-08 DIAGNOSIS — E1165 Type 2 diabetes mellitus with hyperglycemia: Secondary | ICD-10-CM

## 2019-01-08 DIAGNOSIS — I10 Essential (primary) hypertension: Secondary | ICD-10-CM

## 2019-01-08 DIAGNOSIS — I2510 Atherosclerotic heart disease of native coronary artery without angina pectoris: Secondary | ICD-10-CM

## 2019-01-08 DIAGNOSIS — G4730 Sleep apnea, unspecified: Secondary | ICD-10-CM

## 2019-01-08 DIAGNOSIS — I4891 Unspecified atrial fibrillation: Secondary | ICD-10-CM

## 2019-01-08 DIAGNOSIS — I495 Sick sinus syndrome: Secondary | ICD-10-CM

## 2019-01-08 DIAGNOSIS — E119 Type 2 diabetes mellitus without complications: Secondary | ICD-10-CM

## 2019-01-08 DIAGNOSIS — E7800 Pure hypercholesterolemia, unspecified: Secondary | ICD-10-CM

## 2019-01-15 ENCOUNTER — Encounter: Admit: 2019-01-15 | Discharge: 2019-01-15 | Payer: MEDICARE

## 2019-01-15 NOTE — Telephone Encounter
Patient's wife states that the pharmacy has Larry's Ozempic ready.    He is supposed to take it tomorrow.  "Should I go ahead and get the Ozempic or has she Vernell Morgans APRN) worked something else out"?

## 2019-01-22 ENCOUNTER — Encounter: Admit: 2019-01-22 | Discharge: 2019-01-22 | Payer: MEDICARE

## 2019-01-22 NOTE — Progress Notes
Medication Assistance Program packet has been sent to patient for signatures and other required documents (if applicable) . Once received back, medication assistance coordinator will begin processing     Paul Benjamin  Medication Assistance Coordinator   [phone]

## 2019-02-16 ENCOUNTER — Encounter: Admit: 2019-02-16 | Discharge: 2019-02-16 | Payer: MEDICARE

## 2019-03-16 ENCOUNTER — Encounter: Admit: 2019-03-16 | Discharge: 2019-03-16 | Payer: MEDICARE

## 2019-03-30 ENCOUNTER — Encounter: Admit: 2019-03-30 | Discharge: 2019-03-30 | Payer: MEDICARE

## 2019-03-30 DIAGNOSIS — E1165 Type 2 diabetes mellitus with hyperglycemia: Secondary | ICD-10-CM

## 2019-03-30 LAB — LIPID PROFILE
Lab: 43 — ABNORMAL HIGH (ref 5–40)
Lab: 151 mg/dL — ABNORMAL HIGH (ref <200)
Lab: 215 mg/dL — ABNORMAL HIGH (ref <150)
Lab: 30 mg/dL — ABNORMAL LOW (ref >=40)
Lab: 5 % (ref 0–5)
Lab: 78 mg/dL (ref <100)

## 2019-03-30 LAB — COMPREHENSIVE METABOLIC PANEL
Lab: 0.5 mg/dL (ref 0.2–1.2)
Lab: 103 mmol/L (ref 98–107)
Lab: 144 mmol/L (ref 136–145)
Lab: 159 mg/dL — ABNORMAL HIGH (ref 70–105)
Lab: 2 mg/dL — ABNORMAL HIGH (ref 0.72–1.25)
Lab: 20 U/L (ref 5–34)
Lab: 25 U/L (ref 0–55)
Lab: 27 mmol/L (ref 23–31)
Lab: 3.7 g/dL (ref 3.4–4.8)
Lab: 33 mL/min/{1.73_m2} (ref >59)
Lab: 4 mmol/L (ref 3.5–5.1)
Lab: 6.8 g/dL (ref 6.2–8.1)
Lab: 69 mg/dL — ABNORMAL HIGH (ref 8.4–25.7)
Lab: 75 U/L (ref 40–150)
Lab: 9.5 mg/dL (ref 8.8–10.0)

## 2019-03-30 LAB — HEMOGLOBIN A1C: Lab: 6.7

## 2019-04-07 ENCOUNTER — Encounter: Admit: 2019-04-07 | Discharge: 2019-04-07 | Payer: MEDICARE

## 2019-04-07 MED ORDER — CARVEDILOL 25 MG PO TAB
ORAL_TABLET | Freq: Two times a day (BID) | ORAL | 3 refills | 90.00000 days | Status: DC
Start: 2019-04-07 — End: 2019-06-29

## 2019-04-09 ENCOUNTER — Encounter: Admit: 2019-04-09 | Discharge: 2019-04-09 | Payer: MEDICARE

## 2019-04-09 ENCOUNTER — Ambulatory Visit: Admit: 2019-04-09 | Discharge: 2019-04-09 | Payer: MEDICARE

## 2019-04-09 DIAGNOSIS — Z950 Presence of cardiac pacemaker: Secondary | ICD-10-CM

## 2019-04-09 DIAGNOSIS — I480 Paroxysmal atrial fibrillation: Secondary | ICD-10-CM

## 2019-04-09 DIAGNOSIS — I495 Sick sinus syndrome: Secondary | ICD-10-CM

## 2019-05-18 ENCOUNTER — Encounter: Admit: 2019-05-18 | Discharge: 2019-05-18 | Payer: MEDICARE

## 2019-05-18 DIAGNOSIS — I480 Paroxysmal atrial fibrillation: Secondary | ICD-10-CM

## 2019-05-18 DIAGNOSIS — I10 Essential (primary) hypertension: Secondary | ICD-10-CM

## 2019-05-18 NOTE — Telephone Encounter
Lab orders faxed to [facility] for upcoming OV.  Pt aware.

## 2019-05-19 ENCOUNTER — Encounter: Admit: 2019-05-19 | Discharge: 2019-05-19 | Payer: MEDICARE

## 2019-05-19 DIAGNOSIS — I10 Essential (primary) hypertension: Secondary | ICD-10-CM

## 2019-05-19 DIAGNOSIS — I480 Paroxysmal atrial fibrillation: Secondary | ICD-10-CM

## 2019-05-19 LAB — BASIC METABOLIC PANEL
Lab: 150 — ABNORMAL HIGH (ref 70–105)
Lab: 103 U/L (ref 25–110)
Lab: 143 mg/dL (ref 0.3–1.2)
Lab: 2.3 U/L — ABNORMAL HIGH (ref 0.72–1.25)
Lab: 28 U/L (ref 7–40)
Lab: 3.9 g/dL (ref 3.5–5.0)
Lab: 63 MMOL/L — ABNORMAL HIGH (ref 8.4–25.7)

## 2019-05-19 LAB — MAGNESIUM: Lab: 2.7 mg/dL — ABNORMAL HIGH (ref 1.6–2.6)

## 2019-05-19 LAB — BNP (B-TYPE NATRIURETIC PEPTI): Lab: 73 mg/dL (ref 0.4–1.00)

## 2019-05-21 ENCOUNTER — Encounter: Admit: 2019-05-21 | Discharge: 2019-05-21 | Payer: MEDICARE

## 2019-05-21 DIAGNOSIS — I10 Essential (primary) hypertension: Secondary | ICD-10-CM

## 2019-05-21 DIAGNOSIS — I2510 Atherosclerotic heart disease of native coronary artery without angina pectoris: Secondary | ICD-10-CM

## 2019-05-21 DIAGNOSIS — I5032 Chronic diastolic (congestive) heart failure: Secondary | ICD-10-CM

## 2019-05-21 DIAGNOSIS — G4730 Sleep apnea, unspecified: Secondary | ICD-10-CM

## 2019-05-21 DIAGNOSIS — E7800 Pure hypercholesterolemia, unspecified: Secondary | ICD-10-CM

## 2019-05-21 DIAGNOSIS — I4891 Unspecified atrial fibrillation: Secondary | ICD-10-CM

## 2019-05-21 DIAGNOSIS — I495 Sick sinus syndrome: Secondary | ICD-10-CM

## 2019-05-21 DIAGNOSIS — E119 Type 2 diabetes mellitus without complications: Secondary | ICD-10-CM

## 2019-05-21 MED ORDER — CHLORTHALIDONE 50 MG PO TAB
50 mg | ORAL_TABLET | Freq: Every day | ORAL | 3 refills | Status: DC
Start: 2019-05-21 — End: 2019-06-29

## 2019-06-08 ENCOUNTER — Encounter: Admit: 2019-06-08 | Discharge: 2019-06-08 | Payer: MEDICARE

## 2019-06-08 DIAGNOSIS — I495 Sick sinus syndrome: Secondary | ICD-10-CM

## 2019-06-08 DIAGNOSIS — I2510 Atherosclerotic heart disease of native coronary artery without angina pectoris: Secondary | ICD-10-CM

## 2019-06-08 DIAGNOSIS — I4891 Unspecified atrial fibrillation: Secondary | ICD-10-CM

## 2019-06-08 DIAGNOSIS — E119 Type 2 diabetes mellitus without complications: Secondary | ICD-10-CM

## 2019-06-08 DIAGNOSIS — I10 Essential (primary) hypertension: Secondary | ICD-10-CM

## 2019-06-08 DIAGNOSIS — E7800 Pure hypercholesterolemia, unspecified: Secondary | ICD-10-CM

## 2019-06-08 DIAGNOSIS — G4730 Sleep apnea, unspecified: Secondary | ICD-10-CM

## 2019-06-23 ENCOUNTER — Encounter: Admit: 2019-06-23 | Discharge: 2019-06-23 | Payer: MEDICARE

## 2019-06-29 ENCOUNTER — Encounter: Admit: 2019-06-29 | Discharge: 2019-06-29 | Payer: MEDICARE

## 2019-06-29 MED ORDER — CARVEDILOL 25 MG PO TAB
25 mg | ORAL_TABLET | Freq: Two times a day (BID) | ORAL | 3 refills | 90.00000 days | Status: AC
Start: 2019-06-29 — End: ?

## 2019-06-29 MED ORDER — CHLORTHALIDONE 50 MG PO TAB
50 mg | ORAL_TABLET | Freq: Every day | ORAL | 3 refills | Status: AC
Start: 2019-06-29 — End: ?

## 2019-07-10 ENCOUNTER — Encounter: Admit: 2019-07-10 | Discharge: 2019-07-10 | Payer: MEDICARE

## 2019-08-03 ENCOUNTER — Encounter: Admit: 2019-08-03 | Discharge: 2019-08-03 | Payer: MEDICARE

## 2019-08-03 DIAGNOSIS — E7800 Pure hypercholesterolemia, unspecified: Secondary | ICD-10-CM

## 2019-08-03 DIAGNOSIS — E119 Type 2 diabetes mellitus without complications: Secondary | ICD-10-CM

## 2019-08-03 DIAGNOSIS — I4891 Unspecified atrial fibrillation: Secondary | ICD-10-CM

## 2019-08-03 DIAGNOSIS — I2510 Atherosclerotic heart disease of native coronary artery without angina pectoris: Secondary | ICD-10-CM

## 2019-08-03 DIAGNOSIS — I10 Essential (primary) hypertension: Secondary | ICD-10-CM

## 2019-08-03 DIAGNOSIS — G4730 Sleep apnea, unspecified: Secondary | ICD-10-CM

## 2019-08-03 DIAGNOSIS — I495 Sick sinus syndrome: Secondary | ICD-10-CM

## 2019-08-03 MED ORDER — TRESIBA FLEXTOUCH U-100 100 UNIT/ML (3 ML) SC INPN
30 [IU] | Freq: Every day | SUBCUTANEOUS | 3 refills | 60.00000 days | Status: AC
Start: 2019-08-03 — End: ?

## 2019-08-03 MED ORDER — INSULIN ASPART 100 UNIT/ML SC FLEXPEN
6 [IU] | Freq: Three times a day (TID) | SUBCUTANEOUS | 3 refills | 42.00000 days | Status: AC
Start: 2019-08-03 — End: ?

## 2019-08-04 ENCOUNTER — Encounter: Admit: 2019-08-04 | Discharge: 2019-08-04 | Payer: MEDICARE

## 2019-08-06 ENCOUNTER — Encounter: Admit: 2019-08-06 | Discharge: 2019-08-06 | Payer: MEDICARE

## 2019-08-06 NOTE — Telephone Encounter
Patient's wife [name] calling to let Marchelle Folks know that they never received the e-mail of patient assistance paperwork  She asked if it could be faxed to [phone]?Marland Kitchen

## 2019-08-10 ENCOUNTER — Encounter: Admit: 2019-08-10 | Discharge: 2019-08-10 | Payer: MEDICARE

## 2019-08-18 ENCOUNTER — Encounter: Admit: 2019-08-18 | Discharge: 2019-08-18 | Payer: MEDICARE

## 2019-08-18 ENCOUNTER — Ambulatory Visit: Admit: 2019-08-18 | Discharge: 2019-08-18 | Payer: MEDICARE

## 2019-08-18 DIAGNOSIS — I10 Essential (primary) hypertension: Secondary | ICD-10-CM

## 2019-08-18 DIAGNOSIS — I480 Paroxysmal atrial fibrillation: Secondary | ICD-10-CM

## 2019-08-18 DIAGNOSIS — I5030 Unspecified diastolic (congestive) heart failure: Secondary | ICD-10-CM

## 2019-08-18 DIAGNOSIS — I495 Sick sinus syndrome: Secondary | ICD-10-CM

## 2019-08-18 DIAGNOSIS — Z950 Presence of cardiac pacemaker: Secondary | ICD-10-CM

## 2019-08-20 ENCOUNTER — Encounter: Admit: 2019-08-20 | Discharge: 2019-08-20 | Payer: MEDICARE

## 2019-08-20 DIAGNOSIS — I495 Sick sinus syndrome: Secondary | ICD-10-CM

## 2019-08-20 DIAGNOSIS — G4730 Sleep apnea, unspecified: Secondary | ICD-10-CM

## 2019-08-20 DIAGNOSIS — E7800 Pure hypercholesterolemia, unspecified: Secondary | ICD-10-CM

## 2019-08-20 DIAGNOSIS — I10 Essential (primary) hypertension: Secondary | ICD-10-CM

## 2019-08-20 DIAGNOSIS — I2510 Atherosclerotic heart disease of native coronary artery without angina pectoris: Secondary | ICD-10-CM

## 2019-08-20 DIAGNOSIS — E119 Type 2 diabetes mellitus without complications: Secondary | ICD-10-CM

## 2019-08-20 DIAGNOSIS — I4891 Unspecified atrial fibrillation: Secondary | ICD-10-CM

## 2019-08-27 ENCOUNTER — Encounter: Admit: 2019-08-27 | Discharge: 2019-08-27 | Payer: MEDICARE

## 2019-08-27 NOTE — Progress Notes
Application for patient's Ozempic, Novolog & Evaristo Bury has been submitted to prescriber for signatures.    Orland Penman  Medication Assistance Coordinator   [phone]

## 2019-08-30 ENCOUNTER — Encounter: Admit: 2019-08-30 | Discharge: 2019-08-30 | Payer: MEDICARE

## 2019-08-30 DIAGNOSIS — I4891 Unspecified atrial fibrillation: Secondary | ICD-10-CM

## 2019-08-30 DIAGNOSIS — E7800 Pure hypercholesterolemia, unspecified: Secondary | ICD-10-CM

## 2019-08-30 DIAGNOSIS — I10 Essential (primary) hypertension: Secondary | ICD-10-CM

## 2019-08-30 DIAGNOSIS — E119 Type 2 diabetes mellitus without complications: Secondary | ICD-10-CM

## 2019-08-30 DIAGNOSIS — G4730 Sleep apnea, unspecified: Secondary | ICD-10-CM

## 2019-08-30 DIAGNOSIS — I2510 Atherosclerotic heart disease of native coronary artery without angina pectoris: Secondary | ICD-10-CM

## 2019-08-30 DIAGNOSIS — I495 Sick sinus syndrome: Secondary | ICD-10-CM

## 2019-08-30 MED ORDER — ASPIRIN 81 MG PO TBEC
81 mg | ORAL_TABLET | Freq: Every day | ORAL | 3 refills | Status: AC
Start: 2019-08-30 — End: ?

## 2019-09-01 ENCOUNTER — Encounter: Admit: 2019-09-01 | Discharge: 2019-09-01 | Payer: MEDICARE

## 2019-09-11 ENCOUNTER — Encounter: Admit: 2019-09-11 | Discharge: 2019-09-11 | Payer: MEDICARE

## 2019-09-11 NOTE — Progress Notes
An application has been submitted to [employer] for [employer], and [employer].      [place] Coordinator  [phone]

## 2019-09-15 ENCOUNTER — Encounter: Admit: 2019-09-15 | Discharge: 2019-09-15 | Payer: MEDICARE

## 2019-09-15 DIAGNOSIS — I10 Essential (primary) hypertension: Secondary | ICD-10-CM

## 2019-09-15 DIAGNOSIS — I2510 Atherosclerotic heart disease of native coronary artery without angina pectoris: Secondary | ICD-10-CM

## 2019-09-15 LAB — BASIC METABOLIC PANEL
Lab: 137 — ABNORMAL HIGH (ref 70–105)
Lab: 2.3 — ABNORMAL HIGH (ref 0.72–1.25)
Lab: 27
Lab: 4.8
Lab: 67 — ABNORMAL HIGH (ref 8.4–25.7)
Lab: 9.9
Lab: 146 — ABNORMAL HIGH (ref 136–145)

## 2019-09-15 LAB — BNP (B-TYPE NATRIURETIC PEPTI): Lab: 90

## 2019-09-15 LAB — MAGNESIUM: Lab: 2.4

## 2019-09-15 NOTE — Telephone Encounter
Patient's wife Alexia Freestone states that she called the [employer] company to get patient delivery of [employer] but they said that they are just a delivery service and that they need to call there insurance company about placing an order for supplies.  Told patient we will submit form to them and hopefully we can get this taken care of for him .  Completed CMN for Freestyle Libre 2 system and placed for Darra Lis APRN to sign.  Marland Kitchen

## 2019-09-15 NOTE — Telephone Encounter
Faxed signed and dated Medicare SWO for FreeStyle Libre 2 to [employer] at [phone].

## 2019-09-17 ENCOUNTER — Encounter: Admit: 2019-09-17 | Discharge: 2019-09-17 | Payer: MEDICARE

## 2019-09-17 NOTE — Progress Notes
MEDICATION ASSISTANCE PROGRAM (MAP)  MANUFACTURER SUPPLIED MEDICATION    Drug: Kathalene Frames, & Novolog  Manufacturer Program: Novo Nordisk  Status: Approved  Enrollment Period: 09/16/2019-03/25/2020  MAP or Drug Company to Dispense: MAP    Notes: Patient eligibility is based off insurance status and/or dx and patient must continue to meet all criteria to remain in the program. Please notify the MAP Program of any changes.       Velvet Bathe  Medication Assitance Coordinator  [phone]

## 2019-10-09 ENCOUNTER — Encounter: Admit: 2019-10-09 | Discharge: 2019-10-09 | Payer: MEDICARE

## 2019-10-09 DIAGNOSIS — E1165 Type 2 diabetes mellitus with hyperglycemia: Secondary | ICD-10-CM

## 2019-10-09 MED ORDER — SEMAGLUTIDE 0.25 MG OR 0.5 MG(2 MG/1.5 ML) SC PNIJ
.5 mg | SUBCUTANEOUS | 3 refills | Status: AC
Start: 2019-10-09 — End: ?

## 2019-10-12 ENCOUNTER — Encounter: Admit: 2019-10-12 | Discharge: 2019-10-12 | Payer: MEDICARE

## 2019-10-12 NOTE — Telephone Encounter
Patient's wife, Alexia Freestone, states that they received a box of patient assistance medication, but it had 1 box of Levemir in it and 2 boxes of Tresiba.  Is that what he's supposed to get?Marland Kitchen

## 2019-10-13 ENCOUNTER — Encounter: Admit: 2019-10-13 | Discharge: 2019-10-13 | Payer: MEDICARE

## 2019-11-25 ENCOUNTER — Encounter: Admit: 2019-11-25 | Discharge: 2019-11-25 | Payer: MEDICARE

## 2019-12-01 ENCOUNTER — Encounter: Admit: 2019-12-01 | Discharge: 2019-12-01 | Payer: MEDICARE

## 2019-12-01 DIAGNOSIS — I495 Sick sinus syndrome: Secondary | ICD-10-CM

## 2019-12-01 DIAGNOSIS — E7800 Pure hypercholesterolemia, unspecified: Secondary | ICD-10-CM

## 2019-12-01 DIAGNOSIS — E119 Type 2 diabetes mellitus without complications: Secondary | ICD-10-CM

## 2019-12-01 DIAGNOSIS — G4730 Sleep apnea, unspecified: Secondary | ICD-10-CM

## 2019-12-01 DIAGNOSIS — I2510 Atherosclerotic heart disease of native coronary artery without angina pectoris: Secondary | ICD-10-CM

## 2019-12-01 DIAGNOSIS — I10 Essential (primary) hypertension: Secondary | ICD-10-CM

## 2019-12-01 DIAGNOSIS — I4891 Unspecified atrial fibrillation: Secondary | ICD-10-CM

## 2019-12-01 MED ORDER — ATORVASTATIN 40 MG PO TAB
40 mg | ORAL_TABLET | Freq: Every day | ORAL | 1 refills | Status: AC
Start: 2019-12-01 — End: ?

## 2019-12-01 MED ORDER — FUROSEMIDE 80 MG PO TAB
80 mg | ORAL_TABLET | Freq: Every day | ORAL | 3 refills | 90.00000 days | Status: AC
Start: 2019-12-01 — End: ?

## 2019-12-02 ENCOUNTER — Encounter: Admit: 2019-12-02 | Discharge: 2019-12-02 | Payer: MEDICARE

## 2019-12-02 DIAGNOSIS — I2510 Atherosclerotic heart disease of native coronary artery without angina pectoris: Secondary | ICD-10-CM

## 2019-12-02 DIAGNOSIS — I10 Essential (primary) hypertension: Secondary | ICD-10-CM

## 2019-12-02 DIAGNOSIS — E1165 Type 2 diabetes mellitus with hyperglycemia: Secondary | ICD-10-CM

## 2019-12-02 MED ORDER — FREESTYLE LIBRE 2 SENSOR MISC KIT
1 | Freq: Before meals | 0 refills | Status: AC
Start: 2019-12-02 — End: ?

## 2019-12-02 NOTE — Progress Notes
Your lab results are normal or within an acceptable range. If you have questions, please send a MyChart message or call the clinic.

## 2019-12-03 ENCOUNTER — Ambulatory Visit: Admit: 2019-12-03 | Discharge: 2019-12-03 | Payer: MEDICARE

## 2019-12-03 ENCOUNTER — Encounter: Admit: 2019-12-03 | Discharge: 2019-12-03 | Payer: MEDICARE

## 2019-12-03 DIAGNOSIS — E7800 Pure hypercholesterolemia, unspecified: Secondary | ICD-10-CM

## 2019-12-03 DIAGNOSIS — E1165 Type 2 diabetes mellitus with hyperglycemia: Secondary | ICD-10-CM

## 2019-12-03 DIAGNOSIS — I4891 Unspecified atrial fibrillation: Secondary | ICD-10-CM

## 2019-12-03 DIAGNOSIS — E559 Vitamin D deficiency, unspecified: Secondary | ICD-10-CM

## 2019-12-03 DIAGNOSIS — E119 Type 2 diabetes mellitus without complications: Secondary | ICD-10-CM

## 2019-12-03 DIAGNOSIS — I10 Essential (primary) hypertension: Secondary | ICD-10-CM

## 2019-12-03 DIAGNOSIS — R600 Localized edema: Secondary | ICD-10-CM

## 2019-12-03 DIAGNOSIS — G4730 Sleep apnea, unspecified: Secondary | ICD-10-CM

## 2019-12-03 DIAGNOSIS — I2510 Atherosclerotic heart disease of native coronary artery without angina pectoris: Secondary | ICD-10-CM

## 2019-12-03 DIAGNOSIS — I495 Sick sinus syndrome: Secondary | ICD-10-CM

## 2019-12-03 LAB — LIPID PROFILE
Lab: 129 mg/dL (ref <200)
Lab: 269 mg/dL — ABNORMAL HIGH (ref <150)
Lab: 35 mg/dL — ABNORMAL LOW (ref >40)
Lab: 54 mg/dL (ref 8.5–10.6)
Lab: 63 mg/dL — ABNORMAL HIGH (ref <100)
Lab: 94 mg/dL (ref 6.0–8.0)

## 2019-12-03 LAB — TSH WITH FREE T4 REFLEX: Lab: 1.9 uU/mL (ref 0.35–5.00)

## 2019-12-03 LAB — COMPREHENSIVE METABOLIC PANEL
Lab: 143 MMOL/L — ABNORMAL LOW (ref >60)
Lab: 3.8 MMOL/L (ref >60)

## 2019-12-03 MED ORDER — INSULIN ASPART 100 UNIT/ML SC FLEXPEN
3 refills | 30.00000 days | Status: AC
Start: 2019-12-03 — End: ?

## 2019-12-03 MED ORDER — SEMAGLUTIDE 1 MG/DOSE (4 MG/3 ML) SC PNIJ
1 mg | SUBCUTANEOUS | 3 refills | Status: AC
Start: 2019-12-03 — End: ?

## 2019-12-03 NOTE — Patient Instructions
It was nice to see you today!    Goals we discussed today:     [facility] Patient Assistance: [phone]    Increase Ozempic 1 mg every 7 days    Lower Tresiba to 30 units, if your morning blood sugar is running under 100 you may lower by 5 units    Change Novolog to correction only:            70-150  0 units                        151-200                          2 units                                201-250                  4 units                        251-300                         6 units                        301-350                   8 units                        351-400                      10 units                        401 or more               12 units    Check blood sugar before meals for now, if sugars are leveling out and you're not needing the Novolog very often you may check 1-2 times/ day: in the morning before breakfast and before bedtime.        Please contact [facility] for any questions or abnormal glucose values (above 300 or less than 70 repeatedly).  [phone]   My nurse, Nehemiah Settle, can be reached at [phone]      Helpful Resources:  Books  - Bright Spots and [facility] by Amedeo Gory, free online download, paper back $6.42, kindle $1.99  - Diabetes Friendly Table (cookbook) by [employer] of [place] MS RD CDE, Anderson [place]    Online:  - LodgingShop.fi, learn about what's new in diabetes, sign up for their weekly email  - Diabetes.org, the American Diabetes Association's website is full of great information and resources  - raqclyl.com, online cooking class, options $10/ month or $50/ 60 day course      Health goals for a person with diabetes to avoid complications are:    Check your FingerStick blood glucose:  Each time you take medications for your glucose, goals are:   Fasting and pre-meal glucose:  70-130 mg/dl   Bedtime blood glucose: 90-180 mg/dl    LDL Cholesterol:  <100 mg/dl   Triglycerides: <150 mg/dl   HDL >40  for men, >50 for women Blood pressure:  Less than 130/90 mm HG  Maintain a Healthy Weight , Exercise: 30 minutes 5 times per week    Foot care:  Take good care of your feet.  Never cut your nails close to the tips of the toes. Promptly call your doctor if you have an infection, ulcer or cut anywhere on your feet. Wear shoes with a wide toe box and that fit comfortably.  Avoid walking barefoot.     Your recent lab values:    Glucose, POC   Date/Time Value Ref Range Status   11/01/2018 08:28 AM 145 (H) 70 - 100 MG/DL Final     Cholesterol   Date/Time Value Ref Range Status   03/04/2019 07:28 AM 151 <200 mgdL Final     HDL   Date/Time Value Ref Range Status   03/04/2019 07:28 AM 30 (L) >=40 mg/dL Final     LDL   Date/Time Value Ref Range Status   03/04/2019 07:28 AM 78 <100 mg/dL Final     Triglycerides   Date/Time Value Ref Range Status   03/04/2019 07:28 AM 215 (H) <150 mg/dL Final          Virtual [facility] Diabetes Classes and Education Opportunities  During COVID-19    Diabetes Self-Management Program* Comprehensive diabetes education designed for those that are newly diagnosed or those in need of a refresher. In group discussion format using [employer] ? need doctor prescription and insurance approval.  Four classes held over two months.  Patients are encouraged to schedule a one-on-one with a dietitian during this time as well for help creating a personalized meal plan.  Afternoon Sessions*: Offered 2nd and 4th Tuesdays from 1:00p-3:00p, 4 classes total   Start dates, 2020: January 14, March 10, May 12, August 11, October 13  Evening Sessions*: Offered 2nd and 4th Mondays from 6:00p-8:00p, 4 classes total   Start dates, 2020: January 13, March 9, May 11, August 10, October 12    One-on-one education with a Certified Diabetes Educator or Dietitian* Personal education tailored to meet your needs. 1 hour session initially; 30-minute follow-up visits; various days/times offered     Diabetes Survival Skills Basic survival skills class with information tailored to meet your needs   Offered 2nd and 4th Monday 2:00p-3:00p (except holidays) -- Sign up REQUIRED    Continuous Glucose Monitor (CGM) Class* Gain better control of your blood sugars using your CGM! Learn how to identify blood sugar patterns, adjust treatment plan based on trend arrows, understand time in range, set appropriate alerts & alarms, and download reports at home.  Offered 1st Tuesday, 3:00p-5:00p    Pump and Sensor Preview* For those that are curious about insulin pumps or sensors but need help understanding what to expect, how it works, and deciding which one. Or those with a pump coming out of warranty that would like to see what is new on the market.   Offered 3rd Tuesday, 3:00p-5:00p     Wellness Series Regardless of your current health, everyone can benefit from knowing how to maintain healthy blood sugar from diet and lifestyle and using these tools for life. Class topics: Nutrition for Balanced Blood Sugars, Moving for Better Health, Stress and Your Health, and Sleep and Your Health. Join for one or all!  Offered select Tuesdays of the month. Please email [email or web address] to add yourself to the list.    Diabetes Support Group  Ongoing education & support. No fee or registration needed. Held  the first Wednesday of each month, 6-7:30pm  Join Zoom Meeting: [email or web address] or Meeting ID: [id number], Password: [id number]    Cooking with [facility]  For information on interactive cooking classes please email [email or web address]    Introduction to Low Carb and Very Low Carb (Keto) Diets in Diabetes Management  For information on low carbohydrate classes please email [email or web address]    To schedule Diabetes Education please call [phone], Option 4    *There is a charge for this class. Most insurance companies require a referral from your doctor. Contact your insurance company to determine coverage for diabetes education.

## 2019-12-04 ENCOUNTER — Encounter: Admit: 2019-12-04 | Discharge: 2019-12-04 | Payer: MEDICARE

## 2019-12-04 LAB — 25-OH VITAMIN D (D2 + D3): Lab: 62 ng/mL (ref 30–80)

## 2019-12-08 ENCOUNTER — Encounter: Admit: 2019-12-08 | Discharge: 2019-12-08 | Payer: MEDICARE

## 2019-12-08 DIAGNOSIS — I4891 Unspecified atrial fibrillation: Secondary | ICD-10-CM

## 2019-12-08 DIAGNOSIS — I495 Sick sinus syndrome: Secondary | ICD-10-CM

## 2019-12-08 DIAGNOSIS — E119 Type 2 diabetes mellitus without complications: Secondary | ICD-10-CM

## 2019-12-08 DIAGNOSIS — I10 Essential (primary) hypertension: Secondary | ICD-10-CM

## 2019-12-08 DIAGNOSIS — E7800 Pure hypercholesterolemia, unspecified: Secondary | ICD-10-CM

## 2019-12-08 DIAGNOSIS — I2510 Atherosclerotic heart disease of native coronary artery without angina pectoris: Secondary | ICD-10-CM

## 2019-12-08 DIAGNOSIS — G4730 Sleep apnea, unspecified: Secondary | ICD-10-CM

## 2019-12-14 ENCOUNTER — Encounter: Admit: 2019-12-14 | Discharge: 2019-12-14 | Payer: MEDICARE

## 2019-12-16 ENCOUNTER — Encounter: Admit: 2019-12-16 | Discharge: 2019-12-16 | Payer: MEDICARE

## 2019-12-21 ENCOUNTER — Encounter: Admit: 2019-12-21 | Discharge: 2019-12-21 | Payer: MEDICARE

## 2019-12-21 NOTE — Telephone Encounter
Alexia Freestone calling for patient.  She states, he used his relion sensor, bumped it and it had to be taken off.  Now he is out of sensors and we put in an order a long time ago.  He is doing those finger sticks again.Marland Kitchen

## 2019-12-23 ENCOUNTER — Encounter: Admit: 2019-12-23 | Discharge: 2019-12-23 | Payer: MEDICARE

## 2019-12-28 ENCOUNTER — Encounter: Admit: 2019-12-28 | Discharge: 2019-12-28 | Payer: MEDICARE

## 2019-12-28 DIAGNOSIS — E1165 Type 2 diabetes mellitus with hyperglycemia: Secondary | ICD-10-CM

## 2019-12-28 MED ORDER — FREESTYLE LIBRE 2 SENSOR MISC KIT
PACK | Freq: Before meals | ORAL | 3 refills | 30.00000 days | Status: AC | PRN
Start: 2019-12-28 — End: ?

## 2019-12-29 ENCOUNTER — Encounter: Admit: 2019-12-29 | Discharge: 2019-12-29 | Payer: MEDICARE

## 2019-12-30 ENCOUNTER — Encounter: Admit: 2019-12-30 | Discharge: 2019-12-30 | Payer: MEDICARE

## 2019-12-31 ENCOUNTER — Encounter: Admit: 2019-12-31 | Discharge: 2019-12-31 | Payer: MEDICARE

## 2020-01-11 ENCOUNTER — Encounter: Admit: 2020-01-11 | Discharge: 2020-01-11 | Payer: MEDICARE

## 2020-01-12 ENCOUNTER — Encounter: Admit: 2020-01-12 | Discharge: 2020-01-12 | Payer: MEDICARE

## 2020-01-13 ENCOUNTER — Encounter: Admit: 2020-01-13 | Discharge: 2020-01-13 | Payer: MEDICARE

## 2020-01-18 ENCOUNTER — Encounter: Admit: 2020-01-18 | Discharge: 2020-01-18 | Payer: MEDICARE

## 2020-02-09 ENCOUNTER — Encounter: Admit: 2020-02-09 | Discharge: 2020-02-09 | Payer: MEDICARE

## 2020-02-24 ENCOUNTER — Encounter: Admit: 2020-02-24 | Discharge: 2020-02-24 | Payer: MEDICARE

## 2020-02-24 NOTE — Progress Notes
Applications for patient's Novolog, Tyler Aas, & Ozempic have been submitted to prescriber for signatures.      [place] Coordinator  [phone]

## 2020-03-01 ENCOUNTER — Encounter: Admit: 2020-03-01 | Discharge: 2020-03-01 | Payer: MEDICARE

## 2020-03-08 ENCOUNTER — Encounter: Admit: 2020-03-08 | Discharge: 2020-03-08 | Payer: MEDICARE

## 2020-03-08 DIAGNOSIS — E1122 Type 2 diabetes mellitus with diabetic chronic kidney disease: Secondary | ICD-10-CM

## 2020-03-08 DIAGNOSIS — I25118 Atherosclerotic heart disease of native coronary artery with other forms of angina pectoris: Secondary | ICD-10-CM

## 2020-03-08 DIAGNOSIS — R600 Localized edema: Secondary | ICD-10-CM

## 2020-03-15 ENCOUNTER — Encounter: Admit: 2020-03-15 | Discharge: 2020-03-15 | Payer: MEDICARE

## 2020-03-15 NOTE — Progress Notes
An application has been submitted to [employer] for [place], [place], & Ozempic.          [place] Coordinator  [phone]

## 2020-03-21 ENCOUNTER — Encounter: Admit: 2020-03-21 | Discharge: 2020-03-21 | Payer: MEDICARE

## 2020-03-21 ENCOUNTER — Ambulatory Visit: Admit: 2020-03-21 | Discharge: 2020-03-21 | Payer: MEDICARE

## 2020-03-21 DIAGNOSIS — N184 Chronic kidney disease, stage 4 (severe): Secondary | ICD-10-CM

## 2020-03-21 DIAGNOSIS — I2510 Atherosclerotic heart disease of native coronary artery without angina pectoris: Secondary | ICD-10-CM

## 2020-03-21 DIAGNOSIS — E1122 Type 2 diabetes mellitus with diabetic chronic kidney disease: Secondary | ICD-10-CM

## 2020-03-21 DIAGNOSIS — I495 Sick sinus syndrome: Secondary | ICD-10-CM

## 2020-03-21 DIAGNOSIS — E7800 Pure hypercholesterolemia, unspecified: Secondary | ICD-10-CM

## 2020-03-21 DIAGNOSIS — I4891 Unspecified atrial fibrillation: Secondary | ICD-10-CM

## 2020-03-21 DIAGNOSIS — G4730 Sleep apnea, unspecified: Secondary | ICD-10-CM

## 2020-03-21 DIAGNOSIS — I10 Essential (primary) hypertension: Secondary | ICD-10-CM

## 2020-03-21 DIAGNOSIS — E119 Type 2 diabetes mellitus without complications: Secondary | ICD-10-CM

## 2020-03-21 LAB — BASIC METABOLIC PANEL
Lab: 10 (ref 3–12)
Lab: 108 MMOL/L (ref 98–110)
Lab: 145 MMOL/L (ref 137–147)
Lab: 150 mg/dL — ABNORMAL HIGH (ref 70–100)
Lab: 2 mg/dL — ABNORMAL HIGH (ref 0.4–1.24)
Lab: 27 MMOL/L (ref 21–30)
Lab: 34 mL/min — ABNORMAL LOW (ref >60)
Lab: 4.9 MMOL/L (ref 3.5–5.1)
Lab: 45 mg/dL — ABNORMAL HIGH (ref 7–25)
Lab: 9.4 mg/dL (ref 8.5–10.6)

## 2020-03-21 LAB — PROTEIN/CR RATIO,UR RAN
Lab: 0.7 % (ref 11–15)
Lab: 20 mg/dL (ref 32.0–36.0)

## 2020-03-21 LAB — URINALYSIS, MICROSCOPIC

## 2020-03-22 ENCOUNTER — Encounter: Admit: 2020-03-22 | Discharge: 2020-03-22 | Payer: MEDICARE

## 2020-03-22 NOTE — Telephone Encounter
Received a faxed request from [facility] for last office note and last and next appointment  Faxed to [facility] at [phone]

## 2020-03-28 ENCOUNTER — Encounter: Admit: 2020-03-28 | Discharge: 2020-03-28 | Payer: MEDICARE

## 2020-03-28 DIAGNOSIS — E119 Type 2 diabetes mellitus without complications: Secondary | ICD-10-CM

## 2020-03-28 DIAGNOSIS — I4891 Unspecified atrial fibrillation: Secondary | ICD-10-CM

## 2020-03-28 DIAGNOSIS — E7800 Pure hypercholesterolemia, unspecified: Secondary | ICD-10-CM

## 2020-03-28 DIAGNOSIS — I2510 Atherosclerotic heart disease of native coronary artery without angina pectoris: Secondary | ICD-10-CM

## 2020-03-28 DIAGNOSIS — G4730 Sleep apnea, unspecified: Secondary | ICD-10-CM

## 2020-03-28 DIAGNOSIS — I495 Sick sinus syndrome: Secondary | ICD-10-CM

## 2020-03-28 DIAGNOSIS — I10 Essential (primary) hypertension: Secondary | ICD-10-CM

## 2020-03-29 ENCOUNTER — Encounter: Admit: 2020-03-29 | Discharge: 2020-03-29 | Payer: MEDICARE

## 2020-03-29 NOTE — Telephone Encounter
Spoke w/patient said will have wife call back to reschedule appt, cancelling .... ls

## 2020-04-02 ENCOUNTER — Ambulatory Visit: Admit: 2020-04-02 | Discharge: 2020-04-02 | Payer: MEDICARE

## 2020-04-02 ENCOUNTER — Encounter: Admit: 2020-04-02 | Discharge: 2020-04-02 | Payer: MEDICARE

## 2020-04-02 DIAGNOSIS — N184 Chronic kidney disease, stage 4 (severe): Secondary | ICD-10-CM

## 2020-04-05 NOTE — Progress Notes
MEDICATION ASSISTANCE PROGRAM (MAP)  MANUFACTURER SUPPLIED MEDICATION    Drug: Novolog, Ozempic, & Tyler Aas  Manufacturer Program: [employer]  Status: Approved  Enrollment Period: 04/04/2020-03/25/2021  MAP or Drug Company to [employer]: MAP  Refill Info: Pt to contact MAP for refills    Notes: Patient eligibility is based off insurance status and/or dx and patient must continue to meet all criteria to remain in the program. Please notify the MAP Program of any changes.     [place] Coordinator  [phone]

## 2020-04-08 ENCOUNTER — Encounter: Admit: 2020-04-08 | Discharge: 2020-04-08 | Payer: MEDICARE

## 2020-04-08 ENCOUNTER — Ambulatory Visit: Admit: 2020-04-08 | Discharge: 2020-04-08 | Payer: MEDICARE

## 2020-04-08 DIAGNOSIS — R600 Localized edema: Secondary | ICD-10-CM

## 2020-04-08 DIAGNOSIS — N184 Chronic kidney disease, stage 4 (severe): Secondary | ICD-10-CM

## 2020-04-12 ENCOUNTER — Encounter: Admit: 2020-04-12 | Discharge: 2020-04-12 | Payer: MEDICARE

## 2020-04-12 DIAGNOSIS — E1165 Type 2 diabetes mellitus with hyperglycemia: Secondary | ICD-10-CM

## 2020-04-12 MED ORDER — TRESIBA FLEXTOUCH U-100 100 UNIT/ML (3 ML) SC INPN
30 [IU] | Freq: Every day | SUBCUTANEOUS | 3 refills | 60.00000 days | Status: AC
Start: 2020-04-12 — End: ?

## 2020-04-13 ENCOUNTER — Encounter: Admit: 2020-04-13 | Discharge: 2020-04-13 | Payer: MEDICARE

## 2020-04-13 DIAGNOSIS — E1165 Type 2 diabetes mellitus with hyperglycemia: Secondary | ICD-10-CM

## 2020-04-13 MED ORDER — SEMAGLUTIDE 1 MG/DOSE (4 MG/3 ML) SC PNIJ
1 mg | SUBCUTANEOUS | 3 refills | Status: AC
Start: 2020-04-13 — End: ?

## 2020-04-19 ENCOUNTER — Encounter: Admit: 2020-04-19 | Discharge: 2020-04-19 | Payer: MEDICARE

## 2020-04-19 NOTE — Telephone Encounter
Patient was scheduled for a [employer] Carelink on 04/08/20 that has not been received. Patient was instructed to send a manual transmission. Instructed if the transmitter does not appear to be working properly, they need to contact the device company directly. Patient was provided with that contact number. Requested the patient send [place] a MyChart message or contact our device nurses at [phone] to let us know after they have sent their transmission. Called preferred phone number, Spoke with Paul Benjamin and [name], they sent on last Friday. We did not get asked that they sent another one. I will follow up in a week. Patient verbalized understanding. CDJ

## 2020-04-20 ENCOUNTER — Encounter: Admit: 2020-04-20 | Discharge: 2020-04-20 | Payer: MEDICARE

## 2020-04-21 ENCOUNTER — Encounter: Admit: 2020-04-21 | Discharge: 2020-04-21 | Payer: MEDICARE

## 2020-04-21 DIAGNOSIS — E1165 Type 2 diabetes mellitus with hyperglycemia: Secondary | ICD-10-CM

## 2020-04-21 MED ORDER — TRESIBA FLEXTOUCH U-100 100 UNIT/ML (3 ML) SC INPN
30 [IU] | Freq: Every day | SUBCUTANEOUS | 3 refills | 89.00000 days | Status: AC
Start: 2020-04-21 — End: ?

## 2020-04-21 MED ORDER — SEMAGLUTIDE 1 MG/DOSE (4 MG/3 ML) SC PNIJ
1 mg | SUBCUTANEOUS | 3 refills | Status: AC
Start: 2020-04-21 — End: ?

## 2020-04-22 ENCOUNTER — Encounter: Admit: 2020-04-22 | Discharge: 2020-04-22 | Payer: MEDICARE

## 2020-04-25 ENCOUNTER — Encounter: Admit: 2020-04-25 | Discharge: 2020-04-25 | Payer: MEDICARE

## 2020-04-28 ENCOUNTER — Encounter: Admit: 2020-04-28 | Discharge: 2020-04-28 | Payer: MEDICARE

## 2020-04-28 DIAGNOSIS — I495 Sick sinus syndrome: Secondary | ICD-10-CM

## 2020-04-28 DIAGNOSIS — I10 Essential (primary) hypertension: Secondary | ICD-10-CM

## 2020-04-28 DIAGNOSIS — I4891 Unspecified atrial fibrillation: Secondary | ICD-10-CM

## 2020-04-28 DIAGNOSIS — E7800 Pure hypercholesterolemia, unspecified: Secondary | ICD-10-CM

## 2020-04-28 DIAGNOSIS — E119 Type 2 diabetes mellitus without complications: Secondary | ICD-10-CM

## 2020-04-28 DIAGNOSIS — G4730 Sleep apnea, unspecified: Secondary | ICD-10-CM

## 2020-04-28 DIAGNOSIS — E1165 Type 2 diabetes mellitus with hyperglycemia: Principal | ICD-10-CM

## 2020-04-28 DIAGNOSIS — I2510 Atherosclerotic heart disease of native coronary artery without angina pectoris: Secondary | ICD-10-CM

## 2020-04-28 NOTE — Progress Notes
Obtained patient's verbal consent to treat them and their agreement to [place] financial policy and NPP via this telehealth visit during the Coronavirus Public Health Emergency

## 2020-04-28 NOTE — Progress Notes
Telehealth Visit Note    Date of Service: 04/28/2020    Subjective:      Obtained patient's verbal consent to treat them and their agreement to [facility] financial policy and NPP via this telehealth visit during the [facility] Emergency       Paul Benjamin Patricik Bowlin is a 75 y.o. male.    History of Present Illness  The patient presents to the [facility] at [facility] for evaluation and management of diabetes mellitus via tele health: doximity video call. He was last seen by me in [facility] 2021.     In September, 2020 we started Ozempic, he is able to receive this through the patient assistance program. At our last visit we increased to 1 mg. And changed his Novolog to correction only.    He's been having trouble keeping his freestyle libres on.     In May 2021 Jardiance was DC'd due to renal decline. He has established with [facility].      Type 2 Diabetes mellitus  Dx: ~2010     Last A1c 6.7% in September 2021  POC glucose: 154     Current DM regimen: Ozempic 1 mg every Friday, Tresiba 30 units every morning, Novolog + 2 units for every 50 > 150  Past treatment: Glimepiride- stopped during hospitalization, Jardiance- renal decline  Adherence to medications: all the time  FSBG frequency: FreeStyle Libre  Hyperglycemia: occasionally post prandial  Hypoglycemia: no  Hypoglycemia unawareness?: no  Meals per day / Carb intake: 3 and occasional snack  Exercise: Home Health PT     Complications of DM:  ? CAD: YES, with ablation and stenting, and pacemaker, HF with preserved EF  ? CVA: No  ? PVD: No  ? Amputations: No  ? Retinopathy: No  ? Gastropathy: No  ? Nephropathy: YES  ? Neuropathy: No  ? Depression: No  ? Chronic wounds / delayed healing: YES, has had recent cellulitis--August 2020  ? DM related hospitalizations: No     Last dilated eye exam: December 2021  Last dental exam:   Last DM education / nutritionist visit:      DM related medications:  Statin: Yes, atorvastatin 40 mg  ACE-I: No  ASA: No        The patient provides self blood glucose monitoring values.    Fasting: 200s  Lunch: 150s  Dinner: 190-220         Review of Systems  14 point review of systems was negative    Objective:         ? allopurinol (ZYLOPRIM) 100 mg tablet Take 100 mg by mouth Twice Daily.   ? aspirin EC 81 mg tablet Take one tablet by mouth daily. Take with food.   ? atorvastatin (LIPITOR) 40 mg tablet Take one tablet by mouth daily.   ? calcium carbonate (TUMS) 500 mg (200 mg elemental calcium) chewable tablet Chew 500 mg by mouth as Needed. Indications: heartburn   ? carvediloL (COREG) 25 mg tablet Take one tablet by mouth twice daily with meals. Take with food.   ? cholecalciferol (vitamin D3) (OPTIMAL D3) 50,000 units capsule Take 1 capsule by mouth every 7 days.   ? flash glucose sensor (FREESTYLE LIBRE 2 SENSOR) sensor Use to check blood sugars before meals, bedtime and as needed.  Indications: type 2 diabetes mellitus   ? FREESTYLE LITE STRIPS test strip Use one strip as directed before meals and at bedtime.   ? furosemide (LASIX) 80  mg tablet Take one tablet by mouth daily. Indications: visible water retention   ? gabapentin (NEURONTIN) 100 mg capsule Take 1 capsule by mouth at bedtime daily.   ? insulin aspart (U-100) (NOVOLOG FLEXPEN U-100 INSULIN) 100 unit/mL (3 mL) PEN 1 unit for every 50 > 150. Up to 30 units/day   ? insulin degludec (TRESIBA FLEXTOUCH U-100) 100 unit/mL (3 mL) injection pen Inject thirty Units under the skin daily.   ? levothyroxine (SYNTHROID) 75 mcg tablet Take 75 mcg by mouth daily.   ? MULTIVITAMIN PO Take  by mouth daily. 1 tablet   ? nitroglycerin (NITROSTAT) 0.4 mg tablet Place 1 tablet under tongue every 5 minutes as needed for Chest Pain.   ? potassium chloride SR (K-DUR) 20 mEq tablet Take 20 mEq by mouth daily. Take with a meal and a full glass of water.   ? pramipexole (MIRAPEX) 1.5 mg PO Tab Take 1.5 mg by mouth at bedtime daily.   ? semaglutide (OZEMPIC) 1 mg/dose (4 mg/3 mL) injection PEN Inject 1 mg under the skin every 7 days.   ? traMADoL (ULTRAM) 50 mg tablet Take 1 tablet by mouth twice daily.     Vitals:    04/28/20 1338   Weight: 113.4 kg (250 lb)   Height: 177.8 cm (70)   PainSc: Eight     Body mass index is 35.87 kg/m?Marland Kitchen      Telehealth Patient Reported Vitals     Row Name 04/28/20 1338                Pain Score EIGHT        Pain Loc GENERALIZED                    Physical Exam  Constitutional:       Appearance: Normal appearance.   Pulmonary:      Effort: Pulmonary effort is normal.   Neurological:      Mental Status: He is alert and oriented to person, place, and time.   Psychiatric:         Mood and Affect: Mood normal.         Behavior: Behavior normal.         Thought Content: Thought content normal.              Assessment and Plan:  Diabetes mellitus type 2, controlled   ? Last A1c 6.7% in September, 2021  ? Target A1c < 8% without hypoglycemia, with co-morbidities  ? Currently on Ozempic 1 mg every Friday, Tresiba 30 units every morning, Novolog 2 units for every 50 > 150  ? Complicated by: Nephropathy and CAD     Plan:  ? Patient with mild global hyperglycemia  ? Increase Tresiba to 35 units daily--receives through patient assistance program  ? Continue Ozempic 1 mg every Friday--receives through patient assistance program  ? Continue NovoLog 2 units for every 50 > 150  ?  Due to renal decline his [place] was DC'd.  GFR: 36 in December, 2021  ? Continue freestyle libre 2, suggested use of over patches to prevent from coming off     Diabetic complication assessment:   ? Annual Dilated eye exam: December 2021  ? Annual labs (electrolytes and renal function): Reviewed, last done  December, 2021, now following with nephrology  ? Annual Urine microalbumin/Cr: N/A CKD  ? Foot exam / monofilament exam (recommended annually): September 2021     Supportive care for diabetes:  ?  Diabetic Educator (recommended annually): Consider in the future  ? Nutritionist: Hypovitaminosis D  ? Currently taking Vitamin D3 50,000 units every 7 days  ? Vitamin D: 63 in September, 2021        Lipids: Hyperlipidemia    ? Recommendations for Statin treatment in diabetes:   ? 34-75 yo: No risk factors - moderate dose statin.  CVD risk factors or overt CVD - high dose statin.    ? CVD risk factors include LDL cholesterol ?100 mg/dL (2.6 mmol/L), high blood pressure, and overweight   ? Currently on a statin: atorvastatin 40 mg  ? Annual fasting lipid panel due:  September 2021--nonfasting     Blood pressure: Hypertension   ? Controlled   ? ACE-I or ARB: No        RTC 3 months with me in person at [facility]        Orders Placed This Encounter   ? GLUCOSE METER DOWNLOAD   ? HEMOGLOBIN A1C   ? POC GLUCOSE QUANTITATIVE BLOOD     Patient Instructions     It was nice to see you today!    Goals we discussed today:     Increase tresiba to 35 units    Continue Ozempic 1 mg every Friday    Continue to use NovoLog correction scale before meals: 2 units for every 50 > 150    Continue freestyle libre 2, you may try using libre tape patches to help keep your site on better      Please contact [facility] for any questions or abnormal glucose values (above 300 or less than 70 repeatedly).  [phone]   My nurse, Burna Mortimer, can be reached at [phone]      Helpful Resources:  Books  - Bright Spots and [facility] by Denice Bors, free online download, paper back $6.42, kindle $1.99  - Diabetes Friendly Table (cookbook) by [employer] of [place] and [place] MS RD CDE, Victorino Dike    Online:  - StickerEmporium.com.ee, learn about what's new in diabetes, sign up for their weekly email  - Diabetes.org, the American Diabetes Association's website is full of great information and resources  - AffordableSalon.es, online cooking class, options $10/ month or $50/ 60 day course      Health goals for a person with diabetes to avoid complications are:    Check your FingerStick blood glucose:  Each time you take medications for your glucose, goals are:   Fasting and pre-meal glucose:  70-130 mg/dl   Bedtime blood glucose: 90-180 mg/dl    LDL Cholesterol:  <295 mg/dl   Triglycerides: <284 mg/dl   HDL >13 for men, >24 for women   Blood pressure:  Less than 130/90 mm HG  Maintain a Healthy Weight , Exercise: 30 minutes 5 times per week    Foot care:  Take good care of your feet.  Never cut your nails close to the tips of the toes. Promptly call your doctor if you have an infection, ulcer or cut anywhere on your feet. Wear shoes with a wide toe box and that fit comfortably.  Avoid walking barefoot.     Your recent lab values:    Glucose, POC   Date/Time Value Ref Range Status   12/03/2019 12:00 AM 154  Final     Cholesterol   Date/Time Value Ref Range Status   12/03/2019 02:40 PM 129 <200 MG/DL Final     HDL   Date/Time Value Ref Range Status  12/03/2019 02:40 PM 35 (L) >40 MG/DL Final     LDL   Date/Time Value Ref Range Status   12/03/2019 02:40 PM 63 <100 mg/dL Final     Triglycerides   Date/Time Value Ref Range Status   12/03/2019 02:40 PM 269 (H) <150 MG/DL Final          [facility] Diabetes Classes and Education Opportunities    Diabetes Self-Management Program Comprehensive diabetes education designed for those that are newly diagnosed or that have not received much education in the past. In round table discussion format using the [employer] ? need doctor prescription and insurance approval.  Encouraged to schedule a one-on-one with a Dietitian during this time as well, for help with a personalized meal plan.     4 Classes total held over 2 months.    Day Sessions*: Offered 1st and 3rd Wednesdays from 9:00a-11a 4 classes total  Start dates 2013/2014: Oct 2, Dec 4, Feb 5, April 2, June 4, Aug 6    Evening Sessions*: Offered 2nd and 4th Tuesdays from 5:00p-7:00p 4 classes total  Start dates 2013/2014: Oct 8, Dec 10, Feb 11, April 8, June 10, Aug 12    Offered 1st and 3rd Mondays from 6:00p-8:00p 4 classes total  Start dates 2013/2014: Nov 4, Jan 6, March 3, May 5, July 7  Advanced Diabetes Education Series of classes offered to help you with on-going management.   Basic Carbohydrate Counting*: Offered 1st Thursday from 9:00a-11:00a Covers the recommended diet plan for all people with diabetes.  Learn which foods have carbohydrate, how to estimate amounts of carbs eaten and what amounts of carbs per day/meal you need.    Personalized Insulin Dosing*: Offered 2nd Thursday from 9:00a-11:00a Advanced insulin and carb counting class focusing on transitioning to using an Insulin to Carbohydrate ratio and a correction factor to give the most flexible and accurate insulin dosing available today. This class is recommended for Pre-Pump Therapy as well.   Diabetes Refresher*: Offered 3rd Thursday from 9:00a-11:00a Designed for those that have had diabetes education in the past and would like a refresher on the basics of care and nutrition.   New to Insulin*: Offered 4th Thursday from 9:00a-11:00a Focuses on everything you need to know about starting insulin injections.  Discusses fears and dispels common myths as well as teaching proper storage and delivery technique.    One-on-one education with a Certified Diabetes Educator or Dietitian* Personal education tailored to meet your needs. Need a doctor referral. 1 hour session; various days/times  Managing Diabetes Basic survival skills class ? free class; no sign up or referral required; every Monday 10 am until noon and every Wednesday 3 pm until 5 pm (except holidays)   Offered every Monday 10:00a-12:00p and every Wednesday 3:00p-5:00p  Pump and Sensor Preview For those that are curious about insulin pumps or sensors but need help understanding what to expect, how it works, and deciding which one. Or those with a pump coming out of warranty that would like to see what is new on the market.  Company reps will demo actual pumps and sensors for the last 1 ? hours of class.   Offered 3rd Tuesday: 3:00p-5:30p  To schedule Diabetes Education please call [phone] Option 3  *There is a charge for this class. Contact your insurance company to determine   coverage for diabetes education.      Future Appointments   Date Time Provider Department Center   05/23/2020  9:00 AM Qasim, Cathlyn Parsons,  MBBS [facility] IM   05/23/2020 11:30 AM Arnspiger, Fanny Bien, MD [id number] Surgery   06/21/2020  2:00 PM [place], Cherlynn Polo, MD [facility] CVM Exam   07/28/2020  3:00 PM Betti Cruz, APRN-NP [id number] IM                              40 minutes spent on this patient's encounter with counseling and coordination of care taking >50% of the visit.

## 2020-04-29 ENCOUNTER — Ambulatory Visit: Admit: 2020-04-28 | Discharge: 2020-04-29 | Payer: MEDICARE

## 2020-05-05 NOTE — Telephone Encounter
Received a faxed request from [facility] for last visit and next visit  Completed and faxed back to [phone]

## 2020-05-13 MED ORDER — FREESTYLE LIBRE 2 READER MISC MISC
1 | Freq: Before meals | 0 refills | Status: AC
Start: 2020-05-13 — End: ?

## 2020-05-16 NOTE — Progress Notes
Patient Name:  Paul Benjamin  MRN:  [id number]  DOB:  May 07, 1945  Insurance:  Payor: MEDICARE / Plan: MEDICARE PART A AND B / Product Type: Medicare /          Reason for Visit:  New patient   Diagnosis: Veins      Physician Info:   ? Referring Physician:  No ref. provider found   ? PCP:  [name]      History of Present Illness:  75 year old male referred for leg pain and discoloration.      Scheduled testing:       Location of Films:     Date: Imaging: Impression:   04/08/20 bil ven reflux See worksheet                    Medical History   has a past medical history of Atrial fibrillation (HCC), coronary artery disease, Hypercholesterolemia, Hypertension, Sleep apnea, SSS (sick sinus syndrome) (HCC), and Type II diabetes mellitus (HCC).          Surgical History  has a past surgical history that includes pacemaker placement (09/30/06).    Allergies   Allergies   Allergen Reactions   ? Ciprofloxacin RASH   ? Lisinopril SEE COMMENTS     hyperkalemia   ? Cefuroxime HIVES   ? Clindamycin RASH   ? Plavix [Clopidogrel] RASH   ? Sulfa (Sulfonamide Antibiotics) HIVES   ? Quinine SEE COMMENTS     Allergy recorded in SMS: QUININE          Medications:       ? allopurinol (ZYLOPRIM) 100 mg tablet Take 100 mg by mouth daily.   ? aspirin EC 81 mg tablet Take one tablet by mouth daily. Take with food.   ? atorvastatin (LIPITOR) 40 mg tablet Take one tablet by mouth daily.   ? calcium carbonate (TUMS) 500 mg (200 mg elemental calcium) chewable tablet Chew 500 mg by mouth as Needed. Indications: heartburn   ? carvediloL (COREG) 25 mg tablet Take one tablet by mouth twice daily with meals. Take with food.   ? cholecalciferol (vitamin D3) (OPTIMAL D3) 50,000 units capsule Take 1 capsule by mouth every 7 days.   ? flash glucose sensor (FREESTYLE LIBRE 2 SENSOR) sensor Use to check blood sugars before meals, bedtime and as needed.  Indications: type 2 diabetes mellitus   ? FREESTYLE LIBRE 2 READER reader Use one each as directed before meals and at bedtime. Use as directed.  Indications: type 2 diabetes mellitus   ? FREESTYLE LITE STRIPS test strip Use one strip as directed before meals and at bedtime.   ? furosemide (LASIX) 80 mg tablet Take one tablet by mouth daily. Indications: visible water retention   ? gabapentin (NEURONTIN) 100 mg capsule Take 1 capsule by mouth at bedtime daily.   ? insulin aspart (U-100) (NOVOLOG FLEXPEN U-100 INSULIN) 100 unit/mL (3 mL) PEN 1 unit for every 50 > 150. Up to 30 units/day   ? insulin degludec (TRESIBA FLEXTOUCH U-100) 100 unit/mL (3 mL) injection pen Inject thirty Units under the skin daily.   ? levothyroxine (SYNTHROID) 75 mcg tablet Take 75 mcg by mouth daily.   ? nitroglycerin (NITROSTAT) 0.4 mg tablet Place 1 tablet under tongue every 5 minutes as needed for Chest Pain.   ? potassium chloride SR (K-DUR) 20 mEq tablet Take 20 mEq by mouth daily. Take with a meal and a full glass of water.   ?  pramipexole (MIRAPEX) 1.5 mg PO Tab Take 1.5 mg by mouth at bedtime daily.   ? semaglutide (OZEMPIC) 1 mg/dose (4 mg/3 mL) injection PEN Inject 1 mg under the skin every 7 days.   ? traMADoL (ULTRAM) 50 mg tablet Take 1 tablet by mouth twice daily.           Creatinine:    Comments:

## 2020-05-19 ENCOUNTER — Encounter: Admit: 2020-05-19 | Discharge: 2020-05-19 | Payer: MEDICARE

## 2020-05-23 ENCOUNTER — Ambulatory Visit: Admit: 2020-05-23 | Discharge: 2020-05-24 | Payer: MEDICARE

## 2020-05-23 ENCOUNTER — Encounter: Admit: 2020-05-23 | Discharge: 2020-05-23 | Payer: MEDICARE

## 2020-05-23 DIAGNOSIS — G4730 Sleep apnea, unspecified: Secondary | ICD-10-CM

## 2020-05-23 DIAGNOSIS — I872 Venous insufficiency (chronic) (peripheral): Secondary | ICD-10-CM

## 2020-05-23 DIAGNOSIS — E7800 Pure hypercholesterolemia, unspecified: Secondary | ICD-10-CM

## 2020-05-23 DIAGNOSIS — I10 Essential (primary) hypertension: Secondary | ICD-10-CM

## 2020-05-23 DIAGNOSIS — I4891 Unspecified atrial fibrillation: Secondary | ICD-10-CM

## 2020-05-23 DIAGNOSIS — E119 Type 2 diabetes mellitus without complications: Secondary | ICD-10-CM

## 2020-05-23 DIAGNOSIS — I495 Sick sinus syndrome: Secondary | ICD-10-CM

## 2020-05-23 DIAGNOSIS — I2510 Atherosclerotic heart disease of native coronary artery without angina pectoris: Secondary | ICD-10-CM

## 2020-05-23 DIAGNOSIS — I5032 Chronic diastolic (congestive) heart failure: Secondary | ICD-10-CM

## 2020-05-23 DIAGNOSIS — E782 Mixed hyperlipidemia: Secondary | ICD-10-CM

## 2020-05-23 MED ORDER — FUROSEMIDE 80 MG PO TAB
80 mg | ORAL_TABLET | Freq: Two times a day (BID) | ORAL | 3 refills | 90.00000 days | Status: AC
Start: 2020-05-23 — End: ?

## 2020-05-23 NOTE — Patient Instructions
Continue to take aspirin, and atorvastatin (Lipitor)    Continue to control and monitor blood pressure    Continue to monitor and control blood sugar    Recommend continuing with the compression stockings; stockings with a Velcro strap might be easier for getting on    Elevation of the legs and feet above the level of the heart while sitting or laying down is also recommended      Leg Swelling in Both Legs    Swelling of the feet, ankles, and legs is called edema. It is caused by excess fluid that has collected in the tissues. Extra fluid in the body settles in the lowest part because of gravity. This is why the legs and feet are most affected.  Some of the causes for edema include:  ? Disease of the heart such as congestive heart failure  ? Standing or sitting for long periods of time  ? Infection of the feet or legs  ? Blood pooling in the veins of your legs (venous insufficiency) when the veins have less elasticity  ? Dilated veins in your lower leg (varicose veins)  ? Stockings or other clothing that is tight on your legs. This will cause blood to pool in your legs because the clothing limits blood flow.  ? Some medicines. These include some hormones such as birth control pills, some blood pressure medicines such as calcium channel blockers, steroids, and some antidepressants such as MAO inhibitors and tricyclics.  ? Menstrual periods that cause you to retain fluids  ? Many types of kidney disease  ? Liver failure?or cirrhosis  ? Pregnancy. Some swelling is normal, but a sudden increase in leg swelling or weight gain can be a sign of a dangerous complication of pregnancy called eclampsia.  ? Poor nutrition  ? Thyroid disease  Treatment will depend on what is causing the swelling in your legs. Your healthcare provider may prescribe water pills (diuretics) to get rid of the extra fluid.  Home care  Follow these guidelines when caring for yourself at home:  ? Don't wear clothing such as stockings that are tight on your legs.  ? Keep your legs up while lying or sitting.  ? If infection, injury, or recent surgery is causing the swelling, stay off your legs as much as possible until symptoms get better.  ? If your healthcare provider says that your leg swelling is caused by venous insufficiency or varicose veins, don't sit or stand in one place for long periods of time. Take breaks and walk about every few hours. Brisk walking is a good exercise. It helps circulate the blood that has collected in your leg. Talk with your provider about using support stockings to stop daytime leg swelling.  ? If your provider says that heart disease is causing your leg swelling, follow a low-salt diet to stop extra fluid from staying in your body. You may also need medicine.  Follow-up care  Follow up with your healthcare provider, or as advised.  When to seek medical advice  Call your healthcare provider right away if any of these occur:  ? Swelling in both legs or ankles that gets worse  ? Swelling of the abdomen  ? Redness, warmth, or swelling in one leg  ? Fever of 100.4?F (38?C) or higher ,?or as directed by your healthcare provider  ? Yellow color to your skin or eyes  ? Rapid, unexplained weight gain  ? Having to sleep upright or use an increased number of  pillow  ?  Call 911  This is the fastest and safest way to get to the emergency department. The paramedics can also start treatment on the way to the hospital, if needed.  Call 911, or seekmedical attention right away if  ? You have new shortness of breath or chest pain  ? Worsening shortness of breath or chest pain?  [employer] last reviewed this educational content on 01/24/2018  ? 2000-2021 [employer], [place]. All rights reserved. This information is not intended as a substitute for professional medical care. Always follow your healthcare professional's instructions.

## 2020-05-23 NOTE — Progress Notes
Date of Service: 05/23/2020              Chief Complaint   Patient presents with   ? Consult       History of Present Illness    75 year old gentleman is seen for evaluation of bilateral lower extremity swelling with skin changes.    He has a past medical history of atrial fibrillation, coronary artery disease, hypercholesterolemia and hypertension.  His swelling and skin changes have reviewed progressed over the past several years.    He does wear compression stockings however he is unable to be compliant.  He is having issues putting them on.    Venous duplex scan of the bilateral lower extremities performed on April 08, 2020 demonstrates previously surgically removed great saphenous veins bilaterally.  There is no evidence of deep venous reflux and/or obstruction.  There is a perforator on the right, 10 cm from the heel.    Medical History:   Diagnosis Date   ? Atrial fibrillation (HCC)     hx afib ablation   ? coronary artery disease     s/p PCI LAD   ? Hypercholesterolemia    ? Hypertension    ? Sleep apnea    ? 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 Type II diabetes mellitus [facility])        Surgical History:   Procedure Laterality Date   ? PACEMAKER PLACEMENT  09/30/06   ? 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    Performed by Kathreen Cornfield, MD at [facility] EP LAB   ? Removal Permanent Pacemaker N/A 01/17/2016    Performed by Kathreen Cornfield, MD at [facility] EP LAB       Allergies:  Allergies   Allergen Reactions   ? Ciprofloxacin RASH   ? Lisinopril SEE COMMENTS     hyperkalemia   ? Cefuroxime HIVES   ? Clindamycin RASH   ? Plavix [Clopidogrel] RASH   ? Sulfa (Sulfonamide Antibiotics) HIVES   ? Quinine SEE COMMENTS     Allergy recorded in SMS: QUININE       Medication List:  ? allopurinol (ZYLOPRIM) 100 mg tablet Take 100 mg by mouth daily.   ? aspirin EC 81 mg tablet Take one tablet by mouth daily. Take with food.   ? atorvastatin (LIPITOR) 40 mg tablet Take one tablet by mouth daily.   ? calcium carbonate (TUMS) 500 mg (200 mg elemental calcium) chewable tablet Chew 500 mg by mouth as Needed. Indications: heartburn   ? carvediloL (COREG) 25 mg tablet Take one tablet by mouth twice daily with meals. Take with food.   ? cholecalciferol (vitamin D3) (OPTIMAL D3) 50,000 units capsule Take 1 capsule by mouth every 7 days.   ? flash glucose sensor (FREESTYLE LIBRE 2 SENSOR) sensor Use to check blood sugars before meals, bedtime and as needed.  Indications: type 2 diabetes mellitus   ? FREESTYLE LIBRE 2 READER reader Use one each as directed before meals and at bedtime. Use as directed.  Indications: type 2 diabetes mellitus   ? FREESTYLE LITE STRIPS test strip Use one strip as directed before meals and at bedtime.   ? furosemide (LASIX) 80 mg tablet Take one tablet by mouth twice daily. Indications: visible water retention   ? gabapentin (NEURONTIN) 100 mg capsule Take 1 capsule by mouth at bedtime daily.   ? insulin aspart (U-100) (NOVOLOG FLEXPEN U-100 INSULIN) 100 unit/mL (  3 mL) PEN 1 unit for every 50 > 150. Up to 30 units/day   ? insulin degludec (TRESIBA FLEXTOUCH U-100) 100 unit/mL (3 mL) injection pen Inject thirty Units under the skin daily.   ? levothyroxine (SYNTHROID) 75 mcg tablet Take 75 mcg by mouth daily.   ? nitroglycerin (NITROSTAT) 0.4 mg tablet Place 1 tablet under tongue every 5 minutes as needed for Chest Pain.   ? potassium chloride SR (K-DUR) 20 mEq tablet Take 20 mEq by mouth daily. Take with a meal and a full glass of water.   ? pramipexole (MIRAPEX) 1.5 mg PO Tab Take 1.5 mg by mouth at bedtime daily.   ? semaglutide (OZEMPIC) 1 mg/dose (4 mg/3 mL) injection PEN Inject 1 mg under the skin every 7 days.   ? traMADoL (ULTRAM) 50 mg tablet Take 1 tablet by mouth twice daily.       Social History:   reports that he quit smoking about 54 years ago. He smoked 1.00 pack per day. He has never used smokeless tobacco. He reports that he does not drink alcohol and does not use drugs.    Family History   Problem Relation Age of Onset   ? Asthma Father    ? Cardiovascular Father    ? Hypertension Mother        Review of Systems   Constitutional: Negative.    HENT: Positive for drooling, hearing loss, nosebleeds and postnasal drip.    Eyes: Negative.    Respiratory: Positive for shortness of breath.    Cardiovascular: Positive for leg swelling.   Gastrointestinal: Negative.    Endocrine: Positive for cold intolerance and polyuria.   Genitourinary: Positive for enuresis, frequency and urgency.   Musculoskeletal: Negative.    Skin: Negative.    Allergic/Immunologic: Negative.    Neurological: Negative.    Hematological: Negative.    Psychiatric/Behavioral: Negative.                There were no vitals filed for this visit.  There is no height or weight on file to calculate BMI.     Physical Exam  Constitutional:       Appearance: Normal appearance. He is obese.   HENT:      Head: Normocephalic and atraumatic.   Neck:      Comments:   No carotid bruits  Cardiovascular:      Rate and Rhythm: Normal rate and regular rhythm.      Pulses: Normal pulses.      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  4+ pitting edema.  Pulses are palpable.,  Bilaterally  Neurological:      General: No focal deficit present.      Mental Status: He is alert and oriented to person, place, and time.             Assessment and Plan:      Impression: #1 evidence of chronic venous insufficiency with stasis dermatitis and significant pigmentation and edema.  There is no evidence of significant deep or residual saphenous reflux and/or obstruction.  He has had bilateral great saphenous vein stripping in the past.  2.  Moderate obesity.  3.  History of atrial fibrillation, sick sinus syndrome, hypercholesterolemia and hypertension.    Plan: #1 referral to biofeedback for evaluation.  He may benefit from a Velcro type knee-high compression stocking which could apply more compression in the foot and ankle area and be something that he can put on and  take off.  2.  Elevation of the lower extremities when possible.  3.  Early detection with treatment of oral antibiotics for any evidence of cellulitis.      Problem List Items Addressed This Visit     Mixed dyslipidemia - Primary    Venous stasis dermatitis of both lower extremities    Obesity, morbid (HCC)    Hypercholesterolemia

## 2020-05-24 DIAGNOSIS — E1165 Type 2 diabetes mellitus with hyperglycemia: Secondary | ICD-10-CM

## 2020-05-24 DIAGNOSIS — I2510 Atherosclerotic heart disease of native coronary artery without angina pectoris: Secondary | ICD-10-CM

## 2020-05-24 DIAGNOSIS — N184 Chronic kidney disease, stage 4 (severe): Secondary | ICD-10-CM

## 2020-05-24 DIAGNOSIS — R600 Localized edema: Secondary | ICD-10-CM

## 2020-05-24 DIAGNOSIS — E1122 Type 2 diabetes mellitus with diabetic chronic kidney disease: Secondary | ICD-10-CM

## 2020-05-26 ENCOUNTER — Encounter: Admit: 2020-05-26 | Discharge: 2020-05-26 | Payer: MEDICARE

## 2020-05-26 NOTE — Telephone Encounter
Received a request from [facility] and faxed to [phone]

## 2020-05-30 ENCOUNTER — Encounter: Admit: 2020-05-30 | Discharge: 2020-05-30 | Payer: MEDICARE

## 2020-05-30 DIAGNOSIS — E1165 Type 2 diabetes mellitus with hyperglycemia: Secondary | ICD-10-CM

## 2020-05-30 MED ORDER — FREESTYLE LIBRE 2 SENSOR MISC KIT
PACK | Freq: Before meals | ORAL | 3 refills | 30.00000 days | Status: AC | PRN
Start: 2020-05-30 — End: ?

## 2020-05-31 ENCOUNTER — Encounter: Admit: 2020-05-31 | Discharge: 2020-05-31 | Payer: MEDICARE

## 2020-05-31 MED ORDER — ATORVASTATIN 40 MG PO TAB
ORAL_TABLET | Freq: Every day | 3 refills | Status: AC
Start: 2020-05-31 — End: ?

## 2020-06-08 ENCOUNTER — Encounter: Admit: 2020-06-08 | Discharge: 2020-06-08 | Payer: MEDICARE

## 2020-06-08 DIAGNOSIS — E1122 Type 2 diabetes mellitus with diabetic chronic kidney disease: Secondary | ICD-10-CM

## 2020-06-16 ENCOUNTER — Encounter: Admit: 2020-06-16 | Discharge: 2020-06-16 | Payer: MEDICARE

## 2020-06-16 MED ORDER — CARVEDILOL 25 MG PO TAB
25 mg | ORAL_TABLET | Freq: Two times a day (BID) | ORAL | 3 refills | 90.00000 days | Status: AC
Start: 2020-06-16 — End: ?

## 2020-06-16 MED ORDER — ASPIRIN 81 MG PO TBEC
ORAL_TABLET | Freq: Every day | 3 refills | Status: AC
Start: 2020-06-16 — End: ?

## 2020-06-20 ENCOUNTER — Ambulatory Visit: Admit: 2020-06-20 | Discharge: 2020-06-20 | Payer: MEDICARE

## 2020-06-20 ENCOUNTER — Encounter: Admit: 2020-06-20 | Discharge: 2020-06-20 | Payer: MEDICARE

## 2020-06-20 DIAGNOSIS — I2510 Atherosclerotic heart disease of native coronary artery without angina pectoris: Secondary | ICD-10-CM

## 2020-06-20 DIAGNOSIS — I495 Sick sinus syndrome: Secondary | ICD-10-CM

## 2020-06-20 DIAGNOSIS — I4891 Unspecified atrial fibrillation: Secondary | ICD-10-CM

## 2020-06-20 DIAGNOSIS — N189 Chronic kidney disease, unspecified: Secondary | ICD-10-CM

## 2020-06-20 DIAGNOSIS — G4730 Sleep apnea, unspecified: Secondary | ICD-10-CM

## 2020-06-20 DIAGNOSIS — E119 Type 2 diabetes mellitus without complications: Secondary | ICD-10-CM

## 2020-06-20 DIAGNOSIS — I10 Essential (primary) hypertension: Secondary | ICD-10-CM

## 2020-06-20 DIAGNOSIS — R300 Dysuria: Secondary | ICD-10-CM

## 2020-06-20 DIAGNOSIS — E7800 Pure hypercholesterolemia, unspecified: Secondary | ICD-10-CM

## 2020-06-20 LAB — URINALYSIS, MICROSCOPIC

## 2020-06-20 MED ORDER — LOSARTAN 25 MG PO TAB
12.5 mg | ORAL_TABLET | Freq: Every day | ORAL | 0 refills | 30.00000 days | Status: AC
Start: 2020-06-20 — End: ?

## 2020-06-20 MED ORDER — CEPHALEXIN 500 MG PO CAP
500 mg | ORAL_CAPSULE | Freq: Four times a day (QID) | ORAL | 0 refills | Status: AC
Start: 2020-06-20 — End: ?

## 2020-06-20 NOTE — Patient Instructions
Today we will check the urine for bacteria [urine culture]  Will start you on antibiotics for 5 days  Will start you on a new pill for blood pressure that helps protecting the kidneys and heart = Losartan.   Will start you on a small dose and repeat labs in 10 days after starting it [orders placed]. If the labs look Ok we can increase the dose gradually.   Please stop the potassium chloride pill.

## 2020-06-21 ENCOUNTER — Encounter: Admit: 2020-06-21 | Discharge: 2020-06-21 | Payer: MEDICARE

## 2020-06-21 DIAGNOSIS — I5032 Chronic diastolic (congestive) heart failure: Secondary | ICD-10-CM

## 2020-06-21 DIAGNOSIS — I4891 Unspecified atrial fibrillation: Secondary | ICD-10-CM

## 2020-06-21 DIAGNOSIS — G4730 Sleep apnea, unspecified: Secondary | ICD-10-CM

## 2020-06-21 DIAGNOSIS — I480 Paroxysmal atrial fibrillation: Secondary | ICD-10-CM

## 2020-06-21 DIAGNOSIS — I2510 Atherosclerotic heart disease of native coronary artery without angina pectoris: Secondary | ICD-10-CM

## 2020-06-21 DIAGNOSIS — E7800 Pure hypercholesterolemia, unspecified: Secondary | ICD-10-CM

## 2020-06-21 DIAGNOSIS — I495 Sick sinus syndrome: Secondary | ICD-10-CM

## 2020-06-21 DIAGNOSIS — E782 Mixed hyperlipidemia: Secondary | ICD-10-CM

## 2020-06-21 DIAGNOSIS — I10 Essential (primary) hypertension: Secondary | ICD-10-CM

## 2020-06-21 DIAGNOSIS — E119 Type 2 diabetes mellitus without complications: Secondary | ICD-10-CM

## 2020-06-21 MED ORDER — METOLAZONE 2.5 MG PO TAB
2.5 mg | ORAL_TABLET | ORAL | 3 refills | 84.00000 days | Status: AC
Start: 2020-06-21 — End: ?

## 2020-06-21 MED ORDER — FUROSEMIDE 80 MG PO TAB
80 mg | ORAL_TABLET | Freq: Two times a day (BID) | ORAL | 3 refills | 90.00000 days | Status: AC
Start: 2020-06-21 — End: ?

## 2020-06-21 MED ORDER — POTASSIUM CHLORIDE 20 MEQ PO TBER
20 meq | ORAL_TABLET | Freq: Every day | ORAL | 3 refills | 30.00000 days | Status: AC
Start: 2020-06-21 — End: ?

## 2020-06-22 ENCOUNTER — Encounter: Admit: 2020-06-22 | Discharge: 2020-06-22 | Payer: MEDICARE

## 2020-06-22 NOTE — Telephone Encounter
Received a signed prescription from [facility] to [facility] at [phone] received confirmation

## 2020-07-01 ENCOUNTER — Encounter: Admit: 2020-07-01 | Discharge: 2020-07-01 | Payer: MEDICARE

## 2020-07-01 NOTE — Telephone Encounter
Fax received from [facility] requesting LOV information  Faxed requested information as requested  Confirmation of successful fax received

## 2020-07-05 ENCOUNTER — Encounter: Admit: 2020-07-05 | Discharge: 2020-07-05 | Payer: MEDICARE

## 2020-07-07 ENCOUNTER — Encounter: Admit: 2020-07-07 | Discharge: 2020-07-07 | Payer: MEDICARE

## 2020-07-13 ENCOUNTER — Encounter: Admit: 2020-07-13 | Discharge: 2020-07-13 | Payer: MEDICARE

## 2020-07-13 DIAGNOSIS — E782 Mixed hyperlipidemia: Secondary | ICD-10-CM

## 2020-07-13 DIAGNOSIS — I2510 Atherosclerotic heart disease of native coronary artery without angina pectoris: Secondary | ICD-10-CM

## 2020-07-13 DIAGNOSIS — I5032 Chronic diastolic (congestive) heart failure: Secondary | ICD-10-CM

## 2020-07-13 DIAGNOSIS — I480 Paroxysmal atrial fibrillation: Secondary | ICD-10-CM

## 2020-07-13 MED ORDER — LOSARTAN 25 MG PO TAB
ORAL_TABLET | Freq: Every day | ORAL | 0 refills | 90.00000 days | Status: AC
Start: 2020-07-13 — End: ?

## 2020-07-14 ENCOUNTER — Encounter: Admit: 2020-07-14 | Discharge: 2020-07-14 | Payer: MEDICARE

## 2020-07-14 DIAGNOSIS — I2510 Atherosclerotic heart disease of native coronary artery without angina pectoris: Secondary | ICD-10-CM

## 2020-07-14 DIAGNOSIS — I10 Essential (primary) hypertension: Secondary | ICD-10-CM

## 2020-07-14 DIAGNOSIS — I480 Paroxysmal atrial fibrillation: Secondary | ICD-10-CM

## 2020-07-14 DIAGNOSIS — N189 Chronic kidney disease, unspecified: Secondary | ICD-10-CM

## 2020-07-14 DIAGNOSIS — E782 Mixed hyperlipidemia: Secondary | ICD-10-CM

## 2020-07-14 DIAGNOSIS — I5032 Chronic diastolic (congestive) heart failure: Secondary | ICD-10-CM

## 2020-07-14 LAB — BASIC METABOLIC PANEL
Lab: 1.8 K/UL — ABNORMAL HIGH (ref 0.72–1.25)
Lab: 109 U/L — ABNORMAL HIGH (ref 98–107)
Lab: 130 K/UL — ABNORMAL HIGH (ref 70–105)
Lab: 142 % (ref 0–5)
Lab: 4.6 MMOL/L (ref 21–30)
Lab: 50 mL/min — ABNORMAL HIGH (ref 8.4–25.7)
Lab: 9.4

## 2020-07-14 LAB — BNP (B-TYPE NATRIURETIC PEPTI): Lab: 54

## 2020-07-15 ENCOUNTER — Ambulatory Visit: Admit: 2020-07-15 | Discharge: 2020-07-15 | Payer: MEDICARE

## 2020-07-15 ENCOUNTER — Encounter: Admit: 2020-07-15 | Discharge: 2020-07-15 | Payer: MEDICARE

## 2020-07-15 DIAGNOSIS — I5032 Chronic diastolic (congestive) heart failure: Secondary | ICD-10-CM

## 2020-07-15 DIAGNOSIS — I2510 Atherosclerotic heart disease of native coronary artery without angina pectoris: Secondary | ICD-10-CM

## 2020-07-15 DIAGNOSIS — E782 Mixed hyperlipidemia: Secondary | ICD-10-CM

## 2020-07-15 DIAGNOSIS — I480 Paroxysmal atrial fibrillation: Secondary | ICD-10-CM

## 2020-07-15 MED ORDER — PERFLUTREN LIPID MICROSPHERES 1.1 MG/ML IV SUSP
1-20 mL | Freq: Once | INTRAVENOUS | 0 refills | Status: CP | PRN
Start: 2020-07-15 — End: ?

## 2020-07-26 ENCOUNTER — Encounter: Admit: 2020-07-26 | Discharge: 2020-07-26 | Payer: MEDICARE

## 2020-07-26 MED ORDER — LOSARTAN 25 MG PO TAB
25 mg | ORAL_TABLET | Freq: Every day | ORAL | 1 refills | 30.00000 days | Status: AC
Start: 2020-07-26 — End: ?

## 2020-07-26 NOTE — Telephone Encounter
Repeat BMP after starting losartan showed Cr of 1.8 and normal K level.   The patient was called. He denies any new symptoms. His BP is running 123456 systolic. Denies any dizziness or light-headedness.    Discussed with the patient the plan to increase losartan to 25 mg qday.

## 2020-07-28 ENCOUNTER — Encounter: Admit: 2020-07-28 | Discharge: 2020-07-28 | Payer: MEDICARE

## 2020-07-28 ENCOUNTER — Ambulatory Visit: Admit: 2020-07-28 | Discharge: 2020-07-29 | Payer: MEDICARE

## 2020-07-28 DIAGNOSIS — G4730 Sleep apnea, unspecified: Secondary | ICD-10-CM

## 2020-07-28 DIAGNOSIS — E119 Type 2 diabetes mellitus without complications: Secondary | ICD-10-CM

## 2020-07-28 DIAGNOSIS — E1165 Type 2 diabetes mellitus with hyperglycemia: Secondary | ICD-10-CM

## 2020-07-28 DIAGNOSIS — E7800 Pure hypercholesterolemia, unspecified: Secondary | ICD-10-CM

## 2020-07-28 DIAGNOSIS — I495 Sick sinus syndrome: Secondary | ICD-10-CM

## 2020-07-28 DIAGNOSIS — I10 Essential (primary) hypertension: Secondary | ICD-10-CM

## 2020-07-28 DIAGNOSIS — I2510 Atherosclerotic heart disease of native coronary artery without angina pectoris: Secondary | ICD-10-CM

## 2020-07-28 DIAGNOSIS — I4891 Unspecified atrial fibrillation: Secondary | ICD-10-CM

## 2020-07-28 MED ORDER — TRESIBA FLEXTOUCH U-100 100 UNIT/ML (3 ML) SC INPN
35 [IU] | Freq: Every day | SUBCUTANEOUS | 3 refills | 60.00000 days | Status: AC
Start: 2020-07-28 — End: ?

## 2020-07-29 ENCOUNTER — Encounter: Admit: 2020-07-29 | Discharge: 2020-07-29 | Payer: MEDICARE

## 2020-08-01 ENCOUNTER — Encounter: Admit: 2020-08-01 | Discharge: 2020-08-01 | Payer: MEDICARE

## 2020-08-01 DIAGNOSIS — E1122 Type 2 diabetes mellitus with diabetic chronic kidney disease: Secondary | ICD-10-CM

## 2020-08-02 ENCOUNTER — Encounter: Admit: 2020-08-02 | Discharge: 2020-08-02 | Payer: MEDICARE

## 2020-08-04 ENCOUNTER — Ambulatory Visit: Admit: 2020-08-04 | Discharge: 2020-08-04 | Payer: MEDICARE

## 2020-08-04 ENCOUNTER — Encounter: Admit: 2020-08-04 | Discharge: 2020-08-04 | Payer: MEDICARE

## 2020-08-04 DIAGNOSIS — Z950 Presence of cardiac pacemaker: Secondary | ICD-10-CM

## 2020-08-04 DIAGNOSIS — I495 Sick sinus syndrome: Secondary | ICD-10-CM

## 2020-08-08 ENCOUNTER — Encounter: Admit: 2020-08-08 | Discharge: 2020-08-08 | Payer: MEDICARE

## 2020-08-08 NOTE — Telephone Encounter
Patient was scheduled for a Medtronic [employer] on 07/20/20 that has not been received. Spoke with the patients spouse. Patient was seen in the office on 08/04/20. Advised we would reschedule his transmission and mail a letter. DB.

## 2020-08-11 ENCOUNTER — Encounter: Admit: 2020-08-11 | Discharge: 2020-08-11 | Payer: MEDICARE

## 2020-08-11 DIAGNOSIS — I2510 Atherosclerotic heart disease of native coronary artery without angina pectoris: Secondary | ICD-10-CM

## 2020-08-11 DIAGNOSIS — I10 Essential (primary) hypertension: Secondary | ICD-10-CM

## 2020-08-11 DIAGNOSIS — E1122 Type 2 diabetes mellitus with diabetic chronic kidney disease: Secondary | ICD-10-CM

## 2020-08-11 LAB — BASIC METABOLIC PANEL
ANION GAP: 13
BLD UREA NITROGEN: 48 — ABNORMAL HIGH (ref 8.4–25.7)
CALCIUM: 9.2
CHLORIDE: 111 — ABNORMAL HIGH (ref 98–107)
CO2: 24
CREATININE: 1.9 — ABNORMAL HIGH (ref 0.72–1.25)
GFR ESTIMATED: 35 — ABNORMAL LOW (ref >59)
GLUCOSE,PANEL: 110 — ABNORMAL HIGH (ref 70–105)
POTASSIUM: 4.6
SODIUM: 143

## 2020-08-11 LAB — BNP (B-TYPE NATRIURETIC PEPTI): BNP: 61 g/dL — ABNORMAL LOW (ref 12.0–15.0)

## 2020-08-11 MED ORDER — ATORVASTATIN 40 MG PO TAB
40 mg | ORAL_TABLET | Freq: Every day | ORAL | 3 refills | Status: AC
Start: 2020-08-11 — End: ?

## 2020-08-11 NOTE — Patient Instructions
What is Sodium?    Sodium is a mineral used by the body to control blood pressure and blood volume.  Sodium also helps nerves and muscles work properly.  Sodium chloride (NaCl) is the chemical name for salt, which is 40 percent sodium by weight.  Salt is the most common source of sodium in the diet.  One teaspoon of salt has about 2000 milligrams of sodium.      Why do I need to restrict sodium?    Too much sodium in your diet can put a strain on your/your child s heart.  Restricting the amount of salt in your/your child s diet not only helps the heart work better, it may also help lower your/your child s (high) blood pressure.    Below are food examples to choose/limit:    Meats, fish, poultry  Choose:       Fresh or frozen (unbreaded): chicken, [place], [employer], fish, beef, lamb, pork and veal.  Fresh shellfish, low sodium canned: meat, poultry and fish.  Eggs prepared without salt.  Low sodium peanut butter.  Frozen dinners with less than 600 mg of sodium per dinner.  Limit:  Processed luncheon meats such as bologna, salami, pastrami, and chipped or corned beef.  Sausage, bacon, hot-dogs (beef, chicken or [place]), ham, salt pork, pickled meats, breaded or battered meats.  Canned, salted, cured or smoked:  meats, poultry or fish, sardines anchovies.  Frozen dinners with greater than 600-mg sodium per dinner.    DAIRY  Choose:   Liquid, reconstituted dry or evaporated milk, yogurt, low sodium cheese such as Swiss, mozzarella, grated parmesan, unsalted cottage cheese, and other cheeses labeled low in sodium.  Limit:  Buttermilk, American cheese, hard parmesan cheese, bleu cheese, pimento,  romano, processed cheese, cheese spread or sauces and cream cottage cheese.    FRUITS & VEGETABLES  Choose:   Any fresh, frozen, canned or dried fruit, or any fruit juice.  Fresh or plain frozen vegetables.  Use  no salt added  or  low-sodium  canned vegetables, or drain and rinse canned vegetables to remove some of the sodium.  Limit:  Limit regular canned vegetables, especially canned tomato products, cooked  beans/legumes, and vegetable juices (tomato, V-8).  Sauerkraut, vegetables with cheese or cream sauces, vegetables cooked with salt pork, bacon or meat drippings.  Regular sweet, hot or sour pickles, relish and olives.    STARCHES  Choose:   Regular or salt-free breads, unsalted or low sodium snack foods, and dry cereals with less than 250-mg sodium per serving.  Limit the quick cooking and instant cereals to no more than 3 times per week.  Limit:  Crackers, chips, pretzels, nuts and popcorn with salt toppings.  Quick breads made with self-rising flour or commercial/instant mixes.  Commercially seasoned rice and noodle dishes (Rice-a-roni , etc.)    SOUPS  Choose:   Homemade soups using low sodium bouillon cube or homemade broth.  Use reduced sodium canned soups sparingly.  Limit:  Regular canned soups, dry packaged soup mixes or soups made with regular bouillon cubes.    FATS  Choose:   Oil, margarine, mayonnaise, low-sodium salad dressings or homemade salad dressings.    Limit:  Bacon, bacon grease, salt pork, fat back.  Commercial salad dressings, salted nuts.    SEASONINGS  Choose:   Herbs, spices, vinegar, lemon juice, vanilla, and other flavorings.  Garlic powder, onion powder, pepper and celery seed.  Low sodium ketchup, low sodium bouillon and low sodium  barbecue sauce.  Cornstarch, yeast.  Limit:  Salt, lite salt, seasoned salts (garlic salt, onion salt, celery salt, lemon pepper, bay seasoning, etc.)  [place]shire sauce, other meat sauces, containing mixes (Shake-n-Bake , etc.), spice and herb mixtures with added salt, and bouillon.

## 2020-08-11 NOTE — Progress Notes
Patient states doing well for the most part continues with sob.  Weight is up today encouraged proper diet.  Will review with JST today for further recommendations.

## 2020-08-12 ENCOUNTER — Encounter: Admit: 2020-08-12 | Discharge: 2020-08-12 | Payer: MEDICARE

## 2020-08-12 MED ORDER — POTASSIUM CHLORIDE 20 MEQ PO TBER
20 meq | ORAL_TABLET | Freq: Every day | ORAL | 3 refills | 30.00000 days | Status: AC
Start: 2020-08-12 — End: ?

## 2020-08-22 ENCOUNTER — Encounter: Admit: 2020-08-22 | Discharge: 2020-08-22 | Payer: MEDICARE

## 2020-08-22 DIAGNOSIS — I495 Sick sinus syndrome: Secondary | ICD-10-CM

## 2020-08-22 DIAGNOSIS — G4730 Sleep apnea, unspecified: Secondary | ICD-10-CM

## 2020-08-22 DIAGNOSIS — I2510 Atherosclerotic heart disease of native coronary artery without angina pectoris: Secondary | ICD-10-CM

## 2020-08-22 DIAGNOSIS — E7800 Pure hypercholesterolemia, unspecified: Secondary | ICD-10-CM

## 2020-08-22 DIAGNOSIS — I10 Essential (primary) hypertension: Secondary | ICD-10-CM

## 2020-08-22 DIAGNOSIS — E119 Type 2 diabetes mellitus without complications: Secondary | ICD-10-CM

## 2020-08-22 DIAGNOSIS — I4891 Unspecified atrial fibrillation: Secondary | ICD-10-CM

## 2020-08-25 ENCOUNTER — Encounter: Admit: 2020-08-25 | Discharge: 2020-08-25 | Payer: MEDICARE

## 2020-09-12 ENCOUNTER — Encounter: Admit: 2020-09-12 | Discharge: 2020-09-12 | Payer: MEDICARE

## 2020-09-12 ENCOUNTER — Ambulatory Visit: Admit: 2020-09-12 | Discharge: 2020-09-12 | Payer: MEDICARE

## 2020-09-12 ENCOUNTER — Ambulatory Visit: Admit: 2020-09-12 | Discharge: 2020-09-13 | Payer: MEDICARE

## 2020-09-12 DIAGNOSIS — I10 Essential (primary) hypertension: Secondary | ICD-10-CM

## 2020-09-12 DIAGNOSIS — I2510 Atherosclerotic heart disease of native coronary artery without angina pectoris: Secondary | ICD-10-CM

## 2020-09-12 DIAGNOSIS — E1122 Type 2 diabetes mellitus with diabetic chronic kidney disease: Principal | ICD-10-CM

## 2020-09-12 DIAGNOSIS — I4891 Unspecified atrial fibrillation: Secondary | ICD-10-CM

## 2020-09-12 DIAGNOSIS — E119 Type 2 diabetes mellitus without complications: Secondary | ICD-10-CM

## 2020-09-12 DIAGNOSIS — E7800 Pure hypercholesterolemia, unspecified: Secondary | ICD-10-CM

## 2020-09-12 DIAGNOSIS — Q613 Polycystic kidney, unspecified: Secondary | ICD-10-CM

## 2020-09-12 DIAGNOSIS — I495 Sick sinus syndrome: Secondary | ICD-10-CM

## 2020-09-12 DIAGNOSIS — G4730 Sleep apnea, unspecified: Secondary | ICD-10-CM

## 2020-09-12 DIAGNOSIS — R809 Proteinuria, unspecified: Secondary | ICD-10-CM

## 2020-09-12 LAB — BASIC METABOLIC PANEL
ANION GAP: 10 (ref 3–12)
BLD UREA NITROGEN: 56 mg/dL — ABNORMAL HIGH (ref 7–25)
CALCIUM: 9.6 mg/dL (ref 8.5–10.6)
CHLORIDE: 109 MMOL/L (ref 98–110)
CO2: 27 MMOL/L (ref 21–30)
EGFR: 30 mL/min — ABNORMAL LOW (ref >60)
GLUCOSE,PANEL: 123 mg/dL — ABNORMAL HIGH (ref 70–100)
POTASSIUM: 4.2 MMOL/L (ref 3.5–5.1)
SODIUM: 146 MMOL/L (ref 137–147)

## 2020-09-12 MED ORDER — FUROSEMIDE 80 MG PO TAB
80 mg | ORAL_TABLET | Freq: Every morning | ORAL | 3 refills | 90.00000 days | Status: AC
Start: 2020-09-12 — End: ?

## 2020-09-13 DIAGNOSIS — N184 Chronic kidney disease, stage 4 (severe): Secondary | ICD-10-CM

## 2020-10-17 ENCOUNTER — Encounter: Admit: 2020-10-17 | Discharge: 2020-10-17 | Payer: MEDICARE

## 2020-10-17 MED ORDER — METOLAZONE 2.5 MG PO TAB
2.5 mg | ORAL_TABLET | ORAL | 3 refills | 84.00000 days | Status: AC
Start: 2020-10-17 — End: ?

## 2020-10-20 ENCOUNTER — Encounter: Admit: 2020-10-20 | Discharge: 2020-10-20 | Payer: MEDICARE

## 2020-10-31 ENCOUNTER — Encounter: Admit: 2020-10-31 | Discharge: 2020-10-31 | Payer: MEDICARE

## 2020-11-03 ENCOUNTER — Encounter: Admit: 2020-11-03 | Discharge: 2020-11-03 | Payer: MEDICARE

## 2020-11-04 ENCOUNTER — Encounter: Admit: 2020-11-04 | Discharge: 2020-11-04 | Payer: MEDICARE

## 2020-11-07 ENCOUNTER — Encounter: Admit: 2020-11-07 | Discharge: 2020-11-07 | Payer: MEDICARE

## 2020-11-10 ENCOUNTER — Encounter: Admit: 2020-11-10 | Discharge: 2020-11-10 | Payer: MEDICARE

## 2020-11-10 ENCOUNTER — Ambulatory Visit: Admit: 2020-11-10 | Discharge: 2020-11-10 | Payer: MEDICARE

## 2020-11-10 DIAGNOSIS — I2510 Atherosclerotic heart disease of native coronary artery without angina pectoris: Secondary | ICD-10-CM

## 2020-11-10 DIAGNOSIS — I4891 Unspecified atrial fibrillation: Secondary | ICD-10-CM

## 2020-11-10 DIAGNOSIS — E782 Mixed hyperlipidemia: Secondary | ICD-10-CM

## 2020-11-10 DIAGNOSIS — R600 Localized edema: Secondary | ICD-10-CM

## 2020-11-10 DIAGNOSIS — I10 Essential (primary) hypertension: Secondary | ICD-10-CM

## 2020-11-10 DIAGNOSIS — E7800 Pure hypercholesterolemia, unspecified: Secondary | ICD-10-CM

## 2020-11-10 DIAGNOSIS — G4730 Sleep apnea, unspecified: Secondary | ICD-10-CM

## 2020-11-10 DIAGNOSIS — I495 Sick sinus syndrome: Secondary | ICD-10-CM

## 2020-11-10 DIAGNOSIS — E1165 Type 2 diabetes mellitus with hyperglycemia: Secondary | ICD-10-CM

## 2020-11-10 DIAGNOSIS — E119 Type 2 diabetes mellitus without complications: Secondary | ICD-10-CM

## 2020-11-10 NOTE — Progress Notes
Date of Service: 11/10/2020    Subjective:         Paul Benjamin is a 75 y.o. male. He is here today with his wife.    Diabetes    The patient presents to the [facility] at [facility] for evaluation and management of diabetes mellitus. He was last seen by me in May 2022.    Since his last visit he has been doing well. Has been going fishing this summer. He has rarely needed Novolog and now has overstock at home    He has been receiving his Ozempic, tresiba and novolog through [employer] patient assistance.      In May 2021 Jardiance was DC'd due to renal decline. He has established with [facility].      Type 2 Diabetes mellitus  Dx: ~2010     A1c today in clinic:6.1%  POC glucose: 104     Current DM regimen: Ozempic 1 mg every Friday, Tresiba 35 units every morning, Novolog + 2 units for every 50 > 150  Past treatment: Glimepiride- stopped during hospitalization, Jardiance- renal decline  Adherence to medications: all the time  FSBG frequency: FreeStyle Libre  Hyperglycemia: occasionally post prandial  Hypoglycemia: occasionally fasting  Hypoglycemia unawareness?: no  Meals per day / Carb intake: 3 and occasional snack  Exercise: Home Health PT     Complications of DM:  ? CAD: YES, with ablation and stenting, and pacemaker, HF with preserved EF  ? CVA: No  ? PVD: No  ? Amputations: No  ? Retinopathy: No  ? Gastropathy: No  ? Nephropathy: YES  ? Neuropathy: No  ? Depression: No  ? Chronic wounds / delayed healing: YES, has had recent cellulitis--August 2020  ? DM related hospitalizations: No     Last dilated eye exam: December 2021  Last dental exam: upcoming visit August 2022  Last DM education / nutritionist visit:      DM related medications:  Statin: Yes, atorvastatin 40 mg  ACE-I: No  ASA: No      Continuous Glucose Monitoring Analysis and Interpretation    Indication for Device Placement: Blood sugar values not well controlled as evidenced by high A1C    Name/Type of Device Placed: FreeStyle Libre    Date of Print out: 11/10/20     Analysis of Data    Date Range: August 5- 18, 2022    Sensor usage time (%): 94%    Average glucose (mg/dL): 045    Coefficient of Variation (%): 26.1%    Standard Deviation (mg/dL):      Time in range (%): 80%    Time above range (%): 19%   Time above 250mg /dL (%): 1%    Time below range (%): 0%   Time below 54mg /dL (%): 0%           Review of Systems    14 point review of systems was negative    Objective:         ? allopurinol (ZYLOPRIM) 100 mg tablet Take 100 mg by mouth daily.   ? aspirin EC 81 mg tablet TAKE 1 TABLET BY MOUTH EVERY DAY WITH FOOD   ? atorvastatin (LIPITOR) 40 mg tablet Take one tablet by mouth daily.   ? calcium carbonate (CALCIUM 500 PO) Take 40 mg by mouth.   ? calcium carbonate (TUMS) 500 mg (200 mg elemental calcium) chewable tablet Chew 500 mg by mouth as Needed. Indications: heartburn   ? carvediloL (COREG) 25  mg tablet TAKE ONE TABLET BY MOUTH TWICE DAILY WITH MEALS. TAKE WITH FOOD.   ? cholecalciferol (vitamin D3) (OPTIMAL D3) 50,000 units capsule Take 1 capsule by mouth every 7 days.   ? FREESTYLE LITE STRIPS test strip Use one strip as directed before meals and at bedtime.   ? furosemide (LASIX) 80 mg tablet Take one tablet by mouth every morning. Indications: visible water retention   ? gabapentin (NEURONTIN) 100 mg capsule Take 1 capsule by mouth at bedtime daily.   ? insulin aspart (U-100) (NOVOLOG FLEXPEN U-100 INSULIN) 100 unit/mL (3 mL) PEN 1 unit for every 50 > 150. Up to 30 units/day   ? insulin degludec (TRESIBA FLEXTOUCH U-100) 100 unit/mL (3 mL) injection pen Inject thirty five Units under the skin daily.   ? levothyroxine (SYNTHROID) 75 mcg tablet Take 75 mcg by mouth daily.   ? metOLazone (ZAROXOLYN) 2.5 mg tablet Take one tablet by mouth as directed. one day per week as needed as a booster if weight is above 248 pounds with swelling/shortness of breath.  Take an extra potassium on your metolazone day.   ? nitroglycerin (NITROSTAT) 0.4 mg tablet Place 1 tablet under tongue every 5 minutes as needed for Chest Pain.   ? potassium chloride (K-TAB) 20 mEq tablet Take one tablet by mouth daily. Take additional tablet when taking metalozone   ? pramipexole (MIRAPEX) 1.5 mg PO Tab Take 1.5 mg by mouth at bedtime daily.   ? semaglutide (OZEMPIC) 1 mg/dose (4 mg/3 mL) injection PEN Inject 1 mg under the skin every 7 days. (Patient taking differently: Inject 0.75 mg under the skin every 7 days.)   ? tamsulosin (FLOMAX) 0.4 mg capsule TAKE 1 CAPSULE BY MOUTH AT BEDTIME     Vitals:    11/10/20 1459   BP: 120/69   BP Source: Arm, Left Upper   Pulse: 75   PainSc: Seven   Weight: 110.2 kg (243 lb)   Height: 177.8 cm (5' 10)     Body mass index is 34.87 kg/m?Marland Kitchen      Telehealth Patient Reported Vitals     Row Name 11/10/20 1459                Pain Score SEVEN        Pain Loc GENERALIZED        Patient Position Sitting        BP Source Arm, Left Upper                    Physical Exam  Constitutional:       Appearance: Normal appearance.   Pulmonary:      Effort: Pulmonary effort is normal.   Musculoskeletal:         General: Swelling (trace) present.   Skin:     General: Skin is warm and dry.   Neurological:      Mental Status: He is alert and oriented to person, place, and time.   Psychiatric:         Mood and Affect: Mood normal.         Behavior: Behavior normal.         Thought Content: Thought content normal.              Assessment and Plan:  Diabetes mellitus type 2, controlled   ? A1c today 6.1%  ? Target A1c < 8% without hypoglycemia, with co-morbidities  ? Currently on Ozempic 1 mg every Friday, Tresiba 35  units every morning, Novolog + 2 units for every 50 > 150  ? Complicated by: Nephropathy and CAD     Plan:  ? Patient with some fasting hypoglycemia, rarely having pp hyperglycemia   ? Decrease Tresiba to 32 units daily--receives through patient assistance program  ? Continue Ozempic 1 mg every Friday--receives through patient assistance program  ? Change NovoLog 1 unit for every 50 > 150  ? Due to renal decline his [place] was DC'd.  GFR needs to remain >30 to provide glucose lowering benefit, last GFR 30 in June 2022  ? Continue freestyle libre 2, receiving through diabetes supply center     Diabetic complication assessment:   ? Annual Dilated eye exam: December 2021  ? Annual labs (electrolytes and renal function): Reviewed, last done June 2022, following with nephrology  ? Annual Urine microalbumin/Cr: N/A CKD  ? Foot exam / monofilament exam (recommended annually): September 2021     Supportive care for diabetes:  ? Diabetic Educator (recommended annually): Consider in the future  ? Nutritionist:      Hypovitaminosis D  ? Currently taking Vitamin D3 50,000 units every 7 days  ? Vitamin D: 63 in September, 2021        Lipids: Hyperlipidemia    ? Recommendations for Statin treatment in diabetes:   ? 56-75 yo: No risk factors - moderate dose statin.  CVD risk factors or overt CVD - high dose statin.    ? CVD risk factors include LDL cholesterol ?100 mg/dL (2.6 mmol/L), high blood pressure, and overweight   ? Currently on a statin: atorvastatin 40 mg  ? Annual fasting lipid panel due:  September 2021--nonfasting     Blood pressure: Hypertension   ? Controlled   ? ACE-I or ARB: No        RTC 4 months with me in person at [facility]        Orders Placed This Encounter   ? GLUCOSE METER DOWNLOAD   ? POC HEMOGLOBIN A1C   ? POC GLUCOSE QUANTITATIVE BLOOD     Patient Instructions     It was nice to see you today!    Goals we discussed today:     Lower Tresiba to 32 units daily    Continue Ozempic 1 mg each week    Change Novolog to:    For glucose of ________, take________ units of Novolog with meals, 4 hours apart.            70-150            0 units                        151-200                     1 unit                                     201-250                  2 units                        251-300                         3 units 301-350  4 units                        351-400                      5 units                        401 or more               6 units          Please contact [facility] for any questions or abnormal glucose values (above 300 or less than 70 repeatedly).  [phone]   My nurse, Alexa, can be reached at [phone]      Helpful Resources:  Books  - Bright Sp[employer] and [employer] by Denice Bors, free online download, paper back $6.42, kindle $1.99  - Diabetes Friendly Table (cookbook) by [employer] [place] and Vivia Ewing MS RD CDE    Online:  - [email or web address], learn about what's new in diabetes, sign up for their weekly email  - Diabetes.org, the American Diabetes Association's website is full of great information and resources  - AffordableSalon.es, online cooking class, options $10/ month or $50/ 60 day course      Health goals for a person with diabetes to avoid complications are:    Check your FingerStick blood glucose:  Each time you take medications for your glucose, goals are:   Fasting and pre-meal glucose:  70-130 mg/dl   Bedtime blood glucose: 90-180 mg/dl    LDL Cholesterol:  <130 mg/dl   Triglycerides: <865 mg/dl   HDL >78 for men, >46 for women   Blood pressure:  Less than 130/90 mm HG  Maintain a Healthy Weight , Exercise: 30 minutes 5 times per week    Foot care:  Take good care of your feet.  Never cut your nails close to the tips of the toes. Promptly call your doctor if you have an infection, ulcer or cut anywhere on your feet. Wear shoes with a wide toe box and that fit comfortably.  Avoid walking barefoot.     Your recent lab values:    Glucose, POC   Date/Time Value Ref Range Status   11/10/2020 03:08 PM 104  Final     Cholesterol   Date/Time Value Ref Range Status   03/29/2020 12:00 AM 135  Final     HDL   Date/Time Value Ref Range Status   03/29/2020 12:00 AM 37 (L) >40 Final     LDL   Date/Time Value Ref Range Status   03/29/2020 12:00 AM 65 Final     Triglycerides   Date/Time Value Ref Range Status   03/29/2020 12:00 AM 166 (H) <150 Final        [facility] Classes and Education Opportunities in 2022  During COVID-19     Diabetes Self-Management Program (Telehealth) * Comprehensive diabetes education designed for those that are newly diagnosed or those in need of a refresher. In group discussion format using [employer] - need doctor prescription and insurance approval.  Four classes held over two months.  Patients are encouraged to schedule a one-on-one with a dietitian during this time as well for help creating a personalized meal plan.     Mar-Apr 2022 (Afternoon)  (Tuesday  Afternoon from 1-3 PM: 120 min      Class 1: March 8th  Class 2: March 22nd     Class 3:  April 12th       Class 4:  April 26th     2022: Monday Evening Classes 6-8pm (2nd and 4th Monday at [facility])    May-Jun                                 Aug-Sep                                      Oct-Nov  Class 1:  May 9th               Class 1:  Aug 8th               Class 1: Oct 10th   Class 2:  May 23rd               Class 2:  Aug 22nd               Class 2: Oct 24th    Class 3: Jun 13th               Class 3:  Sep 12th               Class 3: Nov 14th   Class 4: Jun 27th               Class 4:  Sep 26th               Class 4: Nov 28th     2022: Tuesday Afternoon Classes 1-3pm  (2nd and 4th Tuesday at [facility])            May-Jun                                 Aug-Sep                                       Oct-Nov  Class 1:  May 10th               Class 1:  Aug 9th               Class 1: Oct 11th   Class 2:   May 24th               Class 2:  Aug 23rd               Class 2:  Oct 25th    Class  3: Jun 14th               Class 3:  Sep 13th               Class 3: Nov 15th   Class   4: Jun 28th               Class 4:  Sep 27th               Class 4: Nov 29th      Continuous Glucose Monitor (CGM) (Telehealth) * Gain better control of your blood sugars using your CGM! Learn how to identify blood sugar patterns, adjust treatment plan (every 4th  Tuesday from 3-4 pm)     Date offered:    Jan 25  Feb 22  Mar22  Apr 26  May 24  Jun28   Jul26  Aug, 23 Sep 27  Oct 25  Nov 22   Dec 27     To schedule Diabetes Education please call [phone], Option 4     *There is a charge for this class. Most insurance companies require a referral from your doctor. Contact your insurance company to determine coverage for diabetes education.          Free Online programs:     Diabetes Survival Skills Basic survival skills class with information tailored to meet your needs. It's also great for people who want to refresh their diabetes knowledge after completing diabetes group classes -- Every second Tuesday from 3-4 PM. Free classes but Sign up REQUIRED call [phone] or email: [email or web address]. You will receive Zoom link after registration.    Date offered:   Jan 11  Feb 8  Mar 8  Apr 12  May 10  [id number]  Jul 12  Aug 9  Sep 13  Oct 11  Nov 8  Dec 13     - Diabetes Support Group  Ongoing education & support. No fee or registration needed. Held every 1st Wednesday of each month, 6-7:30pm Join Zoom Meeting: [email or web address]  Meeting ID: [id number]     Date offered every 1st Wed of the month:   Jan 5 Feb 1  Mar 2  Apr 5  May 4  Jun 1  Jul 6  Aug 3  Sep 7  Oct 5  Nov 2  Dec 7           - Type 1 Diabetes Meet Up Group: 2nd Tuesday of the month from 4-5 pm. No fee or registration required. Please email [email or web address] to add yourself to the distribution list.   Join Zoom Meeting:  [email or web address]  Meeting ID: [id number] Passcode: [id number]     Date offered:  Jan 11  Feb 8  Mar8  Apr 12  May 10  Jun 14   Jul 12  Aug 9  Sep 13  Oct 11  Nov 8  Dec 13    - Obesity and Type 2 Diabetes  For information please email [email or web address]  Register in advance for this meeting:  [email or web address]  After registering, you will receive a confirmation email containing information about joining the meeting.   Date offered: : every 2 months: Noon-1 pm, CT   Jan 25  Mar 22  May 24  Jul 26  Sep 27  Nov 22    - Cooking with Danella Maiers are invited to [employer] with [facility] class! Get recipe and Register Free at [email or web address]  Join Zoom Meeting: [email or web address]  Meeting ID: [phone]          On every 4th Wednesday from 11.30-12.30 pm:  Jan 26  Feb 23  Mar 23  Apr 20 May 25  June 15.      - Introduction to Low Carb and Very Low Carb (Keto) Diets in Diabetes Management  For information on low carbohydrate classes please email [email or web address]    Join Zoom Meeting: [email or web address]  Meeting ID: [phone]    At noon -1 PM on Jan 24  Mar 28  May 23  July 25             - Wellness Series Regardless  of your current health, everyone can benefit from knowing how to maintain healthy blood sugar from diet and lifestyle and using these tools for life.     Class topics:  Nutrition for Balanced Blood Sugars     Moving for Better Health     Stress and Your Health     Sleep and Your Health.  Join for one or all!    Offered select Tuesdays of the month. Please email [email or web address] to add yourself to the list or visit our website at [email or web address] to watch recorded class.           Future Appointments   Date Time Provider Department Center   12/05/2020 10:00 AM Abagail Kitchens, MBBS [facility] IM   12/22/2020 10:00 AM Vanice Sarah, MD [facility] CVM Exam   03/16/2021  3:30 PM Betti Cruz, APRN-NP [facility] IM                              30 minutes spent on this patient's encounter with counseling and coordination of care taking >50% of the visit.

## 2020-11-21 ENCOUNTER — Encounter: Admit: 2020-11-21 | Discharge: 2020-11-21 | Payer: MEDICARE

## 2020-11-21 MED ORDER — INSULIN ASPART 100 UNIT/ML SC FLEXPEN
3 refills | Status: CN
Start: 2020-11-21 — End: ?

## 2020-11-21 NOTE — Telephone Encounter
Refill request for Humalog  Pt is current with office visits  Refilled per protocol

## 2020-12-05 ENCOUNTER — Encounter: Admit: 2020-12-05 | Discharge: 2020-12-05 | Payer: MEDICARE

## 2020-12-05 ENCOUNTER — Ambulatory Visit: Admit: 2020-12-05 | Discharge: 2020-12-05 | Payer: MEDICARE

## 2020-12-05 DIAGNOSIS — G4730 Sleep apnea, unspecified: Secondary | ICD-10-CM

## 2020-12-05 DIAGNOSIS — I4891 Unspecified atrial fibrillation: Secondary | ICD-10-CM

## 2020-12-05 DIAGNOSIS — I2510 Atherosclerotic heart disease of native coronary artery without angina pectoris: Secondary | ICD-10-CM

## 2020-12-05 DIAGNOSIS — E119 Type 2 diabetes mellitus without complications: Secondary | ICD-10-CM

## 2020-12-05 DIAGNOSIS — E7800 Pure hypercholesterolemia, unspecified: Secondary | ICD-10-CM

## 2020-12-05 DIAGNOSIS — I495 Sick sinus syndrome: Secondary | ICD-10-CM

## 2020-12-05 DIAGNOSIS — I10 Essential (primary) hypertension: Secondary | ICD-10-CM

## 2020-12-05 DIAGNOSIS — E1122 Type 2 diabetes mellitus with diabetic chronic kidney disease: Principal | ICD-10-CM

## 2020-12-05 DIAGNOSIS — N184 Chronic kidney disease, stage 4 (severe): Secondary | ICD-10-CM

## 2020-12-05 LAB — RENAL FUNCTION PANEL
ALBUMIN: 3.8 g/dL (ref 3.5–5.0)
ANION GAP: 8 (ref 3–12)
BLD UREA NITROGEN: 47 mg/dL — ABNORMAL HIGH (ref 7–25)
CALCIUM: 10 mg/dL (ref 8.5–10.6)
CHLORIDE: 112 MMOL/L — ABNORMAL HIGH (ref 98–110)
CO2: 25 MMOL/L (ref 21–30)
CREATININE: 1.9 mg/dL — ABNORMAL HIGH (ref 0.4–1.24)
GLUCOSE,PANEL: 85 mg/dL (ref 70–100)
POTASSIUM: 5.1 MMOL/L (ref 3.5–5.1)
SODIUM: 145 MMOL/L (ref 137–147)

## 2020-12-05 LAB — 25-OH VITAMIN D (D2 + D3): VITAMIN D (25-OH) TOTAL: 81 ng/mL — ABNORMAL HIGH (ref 30–80)

## 2020-12-05 MED ORDER — LOSARTAN 25 MG PO TAB
25 mg | ORAL_TABLET | Freq: Every day | ORAL | 3 refills | 30.00000 days | Status: AC
Start: 2020-12-05 — End: ?

## 2020-12-08 ENCOUNTER — Encounter: Admit: 2020-12-08 | Discharge: 2020-12-08 | Payer: MEDICARE

## 2020-12-08 NOTE — Telephone Encounter
RN received VM from pt wife stating they received a letter from [facility] stating they are no longer covering [place] sensors.    RN called pt and stated we have not received anything but will reach out to meredith from diabetes supply.    Donnella Sham, RN

## 2020-12-09 ENCOUNTER — Encounter: Admit: 2020-12-09 | Discharge: 2020-12-09 | Payer: MEDICARE

## 2020-12-09 NOTE — Telephone Encounter
Received a faxed letter from Patients wife stating the appeal for CGM has been denied  Copy put in Amanda's folder for review

## 2020-12-21 ENCOUNTER — Encounter: Admit: 2020-12-21 | Discharge: 2020-12-21 | Payer: MEDICARE

## 2020-12-21 DIAGNOSIS — E1122 Type 2 diabetes mellitus with diabetic chronic kidney disease: Secondary | ICD-10-CM

## 2020-12-21 NOTE — Progress Notes
Rec'd labs from pt PCP office, scanned into chart for Dr. Avelina Laine review. Noted pt Cr worse @ 2.63, BUN 83, K+ low @ 3.3.  I spoke with his wife, she has been giving him metolazone 2.'5mg'$  every day for the past 2-[redacted] weeks along with his lasix '80mg'$ . His weight is 232-233lb, he's only supposed to take the metolazone if his weight is above 248lb with SOB and swelling. She has also only been giving him one potassium pill 16mq instead of giving him an extra dose with each metolazone. I told her he can probably back off the metolazone now since his weight is well below 248lb and swelling is better. She did mention that he is on doxycycline '100mg'$  BID for the next 7 days due to a bug bite on his leg. BP has also been less than [id number]. Advised have sent message to Dr. QAvelina Laineand will f/u with her and let her know of any further adjustments/changes. AVicente Malesverb understanding.

## 2020-12-21 NOTE — Progress Notes
The patient was called and discussed the lab results with him.   Syliva Overman called the patient's wife; he's been getting metolazone daily for the past 2 weeks [prescribed as prn for weight > 248 lbs].  Will hold metolazone for now. Will hold furosemide for 3 days as well.   Repeat labs in 2 weeks.  Continue same dose potassium.

## 2020-12-21 NOTE — Telephone Encounter
Per Dr. Avelina Laine, agree with pt stopping metolazone, can continue KCL 65mq one tablet daily and get repeat BMP in 1 week. Lab order placed.   Call to pt spouse AVicente Males notified of above. She verb understanding. [facility]

## 2020-12-22 ENCOUNTER — Encounter: Admit: 2020-12-22 | Discharge: 2020-12-22 | Payer: MEDICARE

## 2020-12-22 MED ORDER — ASPIRIN 81 MG PO TBEC
81 mg | ORAL_TABLET | Freq: Every day | ORAL | 1 refills | Status: AC
Start: 2020-12-22 — End: ?

## 2020-12-22 NOTE — Assessment & Plan Note
He had a device check a month ago showing normal device function.  He has very minimal RV pacing.

## 2020-12-22 NOTE — Progress Notes
Date of Service: 12/22/2020    Paul Benjamin is a 75 y.o. male.       HPI     [name] was in the [facility] today for follow-up regarding CAD, atrial fibrillation and heart failure.  He was accompanied by his wife, [name].  He has previously been a patient of Dr. Pierre Bali.    He seems to be getting along reasonably well.  He uses a walker and a cane to get around and says that he has not had any falls.  He denies any problems with angina.  He has chronic exertional breathlessness but the symptoms seem to be stable over the past 6 months.  His volume status seems to be stable as well and he denies any particular increase in peripheral edema or orthopnea.    He checks his blood pressure on a daily basis.  He has a blood glucose monitor that is on 1 arm or the other so he alternates the arms depending upon the location of the glucose monitor.  He says that his blood pressure tends to be a little higher in the right arm than the left.    He denies any palpitations or syncope.  He has had no TIA or stroke symptoms.         Vitals:    12/22/20 0920   BP: 92/52   BP Source: Arm, Right Upper   Pulse: 75   SpO2: 99%   O2 Device: None (Room air)   PainSc: Zero   Weight: 109.8 kg (242 lb)   Height: 177.8 cm (5' 10)     Body mass index is 34.72 kg/m?Marland Kitchen     Past Medical History  Patient Active Problem List    Diagnosis Date Noted   ? Venous stasis dermatitis of both lower extremities 05/23/2020   ? Obesity, morbid (HCC) 05/23/2020   ? Hypercholesterolemia 05/23/2020   ? Uncontrolled type 2 diabetes mellitus with hyperglycemia (HCC) 04/28/2020   ? 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 Lower extremity edema 12/03/2019   ? Hypovitaminosis D 12/03/2019   ? Severe sepsis (HCC) 11/10/2018   ? Acute respiratory failure with hypoxia (HCC) 10/24/2018   ? Hyperglycemia 10/24/2018   ? Pneumonia due to COVID-19 virus 10/22/2018   ? 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 Preoperative clearance 06/26/2013   ? 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 PLMD (periodic limb movement disorder) 09/29/2008   ? 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 SSS (sick sinus syndrome) (HCC)      09/30/06 Dual Chamber PPM Initial ImplantMedtronic generator model #ADDR01 Medtronic atrial lead model #4076, Medtronic RV lead model #4076,      ? A-fib (HCC) 09/30/2006     Brief episode of atrial fibrillation after the stenting.         a.  Recurrent bouts of atrial fibrillation but completely asymptomatic.  3/09:  Hospital admission for sotalol initiation and cardioversion  09/23/07 A fib ablation:  Pulmonary vein antral isolation, ligament of marshall ablation, posterior roof line, cavotricuspid isthmus ablation  6/09 event monitor:  No recurrent AF     ? 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  Implanted device is a MDT Adapta [id number]     ? CAD (coronary artery disease) 09/30/2006     01/21/04 Heart catheterization - Angioplasty and stenting of the anterior descending  coronary artery on 01/21/04 with a Cypher drug-eluting stent used for the LAD. The right coronary and circumflex showed only minimal disease. The ejection fraction was 45-50%.   07/18/2004:  Thallium showing EF of 53% with minimal amount of ischemia involving the               inferolateral segment.  3/09 adenosine thallium stress:  This study suggests a low likelihood for ischemic/jeopardized myocardium. No definite fixed or reversible defects are evident. High risk scintigraphic features are absent. Although the calculated LV ejection fraction was reduced, by visual estimation, the EF would appear at the lower limits of normal. The left ventricle is within normal limits for size.     7/11 thallium:  SUMMARY/OPINION: This study is probably normal. There is a small defect involving the inferior wall. The majority of the defect is fixed while more distally there is a subtle degree of reversibility. Overall I think this probably represents diaphragmatic attenuation though a very slight degree of ischemia cannot be completely excluded. The left ventricular systolic function is normal with a calculated ejection fraction of 61 percent. The end diastolic volume is normal at 100 mL. The pulmonary to myocardial count ratio is normal at 0.36. There is no transient ischemic dilatation.   This study was compared to a myocardial perfusion study obtained in our office on May 26, 2007. The prior study demonstrated an EF of 43 percent though visually it was felt to be in more of the 50-55 percent range. The prior study also demonstrated a perfusion abnormality involving the inferior wall which had some mild reversibility but overall was felt to be due to diaphragmatic attenuation.   In comparing the two studies qualitatively, both demonstrated an inferior wall defect. The prior study had more reversibility involving the basal to mid section while the current study has some mild reversibility involving the distal section. Overall I think both studies probably represent diaphragmatic attenuation.     05/03/2015 - Stress Test:  ? This study is probably normal with inferior attenuation with a small sized mild intensity reversible defect in the basal inferior wall consistent with? diaphragmatic attenuation. Left ventricular systolic function is normal. There are no high risk prognostic indicators present.? The ECG portion of the study is negative for ischemia.         ? Hypertension 09/30/2006   ? Mixed dyslipidemia 09/30/2006   ? Sleep apnea 09/30/2006         Review of Systems   Constitutional: Negative.   HENT: Negative.    Eyes: Negative.    Cardiovascular: Negative.    Respiratory: Negative.    Endocrine: Negative.    Hematologic/Lymphatic: Negative.    Skin: Negative.    Musculoskeletal: Negative.    Gastrointestinal: Negative.    Genitourinary: Negative.    Neurological: Negative. Psychiatric/Behavioral: Negative.    Allergic/Immunologic: Negative.        Physical Exam    Physical Exam   General Appearance: no distress   Skin: warm, no ulcers or xanthomas   Digits and Nails: no cyanosis or clubbing   Eyes: conjunctivae and lids normal, pupils are equal and round   Teeth/Gums/Palate: dentition unremarkable, no lesions   Lips & Oral Mucosa: no pallor or cyanosis   Neck Veins: normal JVP , neck veins are not distended   Thyroid: no nodules, masses, tenderness or enlargement   Chest Inspection: chest is normal in appearance   Respiratory Effort: breathing comfortably, no respiratory distress   Auscultation/Percussion: lungs clear  to auscultation, no rales or rhonchi, no wheezing   PMI: PMI not enlarged or displaced   Cardiac Rhythm: regular rhythm and normal rate   Cardiac Auscultation: S1, S2 normal, no rub, no gallop   Murmurs: no murmur   Peripheral Circulation: normal peripheral circulation   Carotid Arteries: normal carotid upstroke bilaterally, no bruits   Radial Arteries: normal symmetric radial pulses   Abdominal Aorta: no abdominal aortic bruit   Pedal Pulses: normal symmetric pedal pulses   Lower Extremity Edema: no lower extremity edema   Abdominal Exam: soft, non-tender, no masses, bowel sounds normal   Liver & Spleen: no organomegaly   Gait & Station: walks with a walker  Muscle Strength: normal muscle tone   Orientation: oriented to time, place and person   Affect & Mood: appropriate and sustained affect   Language and Memory: patient responsive and seems to comprehend information   Neurologic Exam: neurological assessment grossly intact   Other: moves all extremities      Cardiovascular Health Factors  Vitals BP Readings from Last 3 Encounters:   12/22/20 92/52   12/05/20 129/71   11/10/20 120/69     Wt Readings from Last 3 Encounters:   12/22/20 109.8 kg (242 lb)   12/05/20 110 kg (242 lb 8 oz)   11/10/20 110.2 kg (243 lb)     BMI Readings from Last 3 Encounters:   12/22/20 34.72 kg/m?   12/05/20 34.80 kg/m?   11/10/20 34.87 kg/m?      Smoking Social History     Tobacco Use   Smoking Status Former Smoker   ? Packs/day: 1.00   ? Quit date: 03/26/1966   ? Years since quitting: 54.7   Smokeless Tobacco Never Used      Lipid Profile Cholesterol   Date Value Ref Range Status   03/29/2020 135  Final     HDL   Date Value Ref Range Status   03/29/2020 37 (L) >40 Final     LDL   Date Value Ref Range Status   03/29/2020 65  Final     Triglycerides   Date Value Ref Range Status   03/29/2020 166 (H) <150 Final      Blood Sugar Hemoglobin A1C   Date Value Ref Range Status   03/29/2020 7.3 (H) <6.5 Final     Glucose   Date Value Ref Range Status   12/20/2020 113  Final   12/05/2020 85 70 - 100 MG/DL Final   24/40/1027 253 (H) 70 - 100 MG/DL Final   66/44/0347 425 (H) 70 - 100 mg/dL Final   95/63/8756 433 (H) 70 - 110 MG/DL Final   29/51/8841 660 (H) 70 - 110 MG/DL Final     Glucose, POC   Date Value Ref Range Status   11/10/2020 104  Final   07/28/2020 212  Final   12/03/2019 154  Final          Problems Addressed Today  Encounter Diagnoses   Name Primary?   ? Chronic heart failure with preserved ejection fraction (HFpEF) (HCC)    ? Paroxysmal atrial fibrillation (HCC)    ? Coronary artery disease involving native coronary artery of native heart without angina pectoris    ? Mixed dyslipidemia    ? Primary hypertension    ? Presence of permanent cardiac pacemaker        Assessment and Plan       A-fib [facility])  Last device check was a month ago; no  AF detected.  He had ablation in 2009 and has really had essentially no AF since then.  He was on sotalol in the past, but not for several years.  He's off anti-coagulation due to fall risk and due to the fact that his device allows an accurate means of detecting any recurrence.  His AF was clinically silent when he had it in the past.    CAD (coronary artery disease)  His last stress test was about 3 years ago and non-ischemic.  No angina symptoms.    Hypertension  BP looks OK on current program.    Chronic heart failure with preserved ejection fraction (HFpEF) (HCC)  He had an echocardiogram in April showing ejection fraction 70%.  His volume status appears to be stable on the current medical program, although his furosemide is on hold because of a rise in his BUN level.    Presence of permanent cardiac pacemaker  He had a device check a month ago showing normal device function.  He has very minimal RV pacing.      Current Medications (including today's revisions)  ? allopurinol (ZYLOPRIM) 100 mg tablet Take 100 mg by mouth twice daily.   ? aspirin EC (ASPIR-LOW) 81 mg tablet Take one tablet by mouth daily. Take with food.   ? atorvastatin (LIPITOR) 40 mg tablet Take one tablet by mouth daily.   ? calcium carbonate (TUMS) 500 mg (200 mg elemental calcium) chewable tablet Chew 500 mg by mouth as Needed. Indications: heartburn   ? carvediloL (COREG) 25 mg tablet TAKE ONE TABLET BY MOUTH TWICE DAILY WITH MEALS. TAKE WITH FOOD.   ? cholecalciferol (vitamin D3) (OPTIMAL D3) 50,000 units capsule Take 1 capsule by mouth every 7 days.   ? FREESTYLE LITE STRIPS test strip Use one strip as directed before meals and at bedtime.   ? furosemide (LASIX) 80 mg tablet Take one tablet by mouth every morning. Indications: visible water retention   ? gabapentin (NEURONTIN) 100 mg capsule Take 1 capsule by mouth at bedtime daily.   ? insulin aspart (U-100) (NOVOLOG FLEXPEN U-100 INSULIN) 100 unit/mL (3 mL) PEN Inject 1 unit for every 50 > 150. Up to 30 units/day   ? insulin degludec (TRESIBA FLEXTOUCH U-100) 100 unit/mL (3 mL) injection pen Inject thirty five Units under the skin daily.   ? levothyroxine (SYNTHROID) 75 mcg tablet Take 75 mcg by mouth daily.   ? losartan (COZAAR) 25 mg tablet Take one tablet by mouth daily for 90 days.   ? metOLazone (ZAROXOLYN) 2.5 mg tablet Take one tablet by mouth as directed. one day per week as needed as a booster if weight is above 248 pounds with swelling/shortness of breath.  Take an extra potassium on your metolazone day.   ? nitroglycerin (NITROSTAT) 0.4 mg tablet Place 1 tablet under tongue every 5 minutes as needed for Chest Pain.   ? potassium chloride (K-TAB) 20 mEq tablet Take one tablet by mouth daily. Take additional tablet when taking metalozone   ? pramipexole (MIRAPEX) 1.5 mg PO Tab Take 1.5 mg by mouth at bedtime daily.   ? semaglutide (OZEMPIC) 1 mg/dose (4 mg/3 mL) injection PEN Inject 1 mg under the skin every 7 days. (Patient taking differently: Inject 0.75 mg under the skin every 7 days.)   ? tamsulosin (FLOMAX) 0.4 mg capsule TAKE 1 CAPSULE BY MOUTH AT BEDTIME     Total time spent on today's office visit was 45 minutes.  This includes face-to-face in person  visit with patient as well as nonface-to-face time including review of the EMR, outside records, labs, radiologic studies, echocardiogram & other cardiovascular studies, formation of treatment plan, after visit summary, future disposition, and lastly on documentation.

## 2020-12-22 NOTE — Assessment & Plan Note
Last device check was a month ago; no AF detected.  He had ablation in 2009 and has really had essentially no AF since then.  He was on sotalol in the past, but not for several years.  He's off anti-coagulation due to fall risk and due to the fact that his device allows an accurate means of detecting any recurrence.  His AF was clinically silent when he had it in the past.

## 2020-12-22 NOTE — Assessment & Plan Note
He had an echocardiogram in April showing ejection fraction 70%.  His volume status appears to be stable on the current medical program, although his furosemide is on hold because of a rise in his BUN level.

## 2020-12-22 NOTE — Assessment & Plan Note
His last stress test was about 3 years ago and non-ischemic.  No angina symptoms.

## 2020-12-22 NOTE — Assessment & Plan Note
BP looks OK on current program.

## 2020-12-23 ENCOUNTER — Encounter: Admit: 2020-12-23 | Discharge: 2020-12-23 | Payer: MEDICARE

## 2020-12-29 ENCOUNTER — Encounter: Admit: 2020-12-29 | Discharge: 2020-12-29 | Payer: MEDICARE

## 2020-12-29 NOTE — Telephone Encounter
Received a faxed request from [employer] for office notes from past 6 months   Printed and faxed to [phone]

## 2021-01-24 ENCOUNTER — Encounter: Admit: 2021-01-24 | Discharge: 2021-01-24 | Payer: MEDICARE

## 2021-01-24 NOTE — Telephone Encounter
VM received from pt wife asking what to do since Dexcom was denied  Appears that just the reader was denied according to the letter  Called pt back to see what kind of phone she has  Has [employer] message sent to [place] to see if this is compatible with the Clarity app

## 2021-02-05 ENCOUNTER — Encounter: Admit: 2021-02-05 | Discharge: 2021-02-05 | Payer: MEDICARE

## 2021-02-05 MED ORDER — METOLAZONE 2.5 MG PO TAB
ORAL_TABLET | 3 refills
Start: 2021-02-05 — End: ?

## 2021-02-09 ENCOUNTER — Encounter: Admit: 2021-02-09 | Discharge: 2021-02-09 | Payer: MEDICARE

## 2021-02-09 NOTE — Telephone Encounter
Patient was scheduled for a Medtronic Carelink on 02/03/21 that has not been received. Patient was instructed to send a manual transmission. Instructed if the transmitter does not appear to be working properly, they need to contact the device company directly. Patient was provided with that contact number. Requested the patient send [place] a MyChart message or contact our device nurses at [phone] to let us know after they have sent their transmission. Spoke with pts wife and she will have him send in a transmission. BC     Note: Patient needs to send a manual transmission as we did not receive their scheduled remote interrogation.

## 2021-02-10 NOTE — Progress Notes
Application for patient's Novolog, Tyler Aas & Ozempic has been submitted to prescriber for signatures.]    Peri Jefferson  Medication Assistance Coordinator   [phone]

## 2021-02-14 ENCOUNTER — Encounter: Admit: 2021-02-14 | Discharge: 2021-02-14 | Payer: MEDICARE

## 2021-02-14 NOTE — Telephone Encounter
Faxed signed MAP paperwork for Tresiba, Novolog and Ozempic  Confirmation of successful fax received   Placed original to scan

## 2021-02-27 ENCOUNTER — Encounter: Admit: 2021-02-27 | Discharge: 2021-02-27 | Payer: MEDICARE

## 2021-02-27 NOTE — Progress Notes
An application has been submitted to [employer] for [employer], [employer].        Peri Jefferson  Medication Assistance Coordinator   [phone]

## 2021-02-28 ENCOUNTER — Encounter: Admit: 2021-02-28 | Discharge: 2021-02-28 | Payer: MEDICARE

## 2021-02-28 NOTE — Progress Notes
Paul Benjamin Device  Hello everyone.     Would you please call [name], Referral Coordinator Radiology Lab, at [phone], M-F 7-3.     He is needing information regarding the leads for the patients pacemaker, originally implanted 09-30-2006, replacement on 01-17-2016.     Patient is having MRI on Thursday, 03/02/2021.     If you can fax reports to [phone].     Thank you for assisting.       Faxed 02/28/21 @ 17:08

## 2021-03-06 ENCOUNTER — Encounter: Admit: 2021-03-06 | Discharge: 2021-03-06 | Payer: MEDICARE

## 2021-03-06 MED ORDER — PEN NEEDLE, DIABETIC 32 GAUGE X 5/32" MISC NDLE
1 | Freq: Before meals | 2 refills | 90.00000 days | Status: AC
Start: 2021-03-06 — End: ?

## 2021-03-06 NOTE — Telephone Encounter
VM received from pt wife   Needs Reli On pen needles sent to [facility]  Pt current with OV  Sent per protocol

## 2021-03-16 ENCOUNTER — Ambulatory Visit: Admit: 2021-03-16 | Discharge: 2021-03-17 | Payer: MEDICARE

## 2021-03-16 ENCOUNTER — Encounter: Admit: 2021-03-16 | Discharge: 2021-03-16 | Payer: MEDICARE

## 2021-03-16 DIAGNOSIS — E1165 Type 2 diabetes mellitus with hyperglycemia: Secondary | ICD-10-CM

## 2021-03-16 DIAGNOSIS — E7800 Pure hypercholesterolemia, unspecified: Secondary | ICD-10-CM

## 2021-03-16 DIAGNOSIS — I10 Essential (primary) hypertension: Secondary | ICD-10-CM

## 2021-03-16 DIAGNOSIS — E118 Type 2 diabetes mellitus with unspecified complications: Secondary | ICD-10-CM

## 2021-03-16 MED ORDER — OZEMPIC 1 MG/DOSE (4 MG/3 ML) SC PNIJ
1 mg | SUBCUTANEOUS | 2 refills | Status: CN
Start: 2021-03-16 — End: ?

## 2021-03-16 NOTE — Progress Notes
Obtained patient's verbal consent to treat them and their agreement to Doran and NPP via this telehealth visit during the [facility] Emergency  [place]      114  12/21 1101 181  12/21 10pm 241  12/21 433pm 130  1221 0817 241  12/20 0737am 137   131pm   12/20 448pm 108   1004pm 170  12/19 0723 129   1123 124   436pm 129   1034pm 184  12/18 0744 184   0829 244   1126 122   458 115   614pm 215   840pm 240

## 2021-03-16 NOTE — Progress Notes
Date of Service: 03/16/2021    Subjective:         Paul Benjamin is a 75 y.o. male. He is here today with his wife.    Obtained patient's verbal consent to treat them and their agreement to [facility] financial policy and NPP via this telehealth visit during the [facility] Emergency      Diabetes    The patient presents to the [facility] at [facility] for evaluation and management of diabetes mellitus. He was last seen by me in May 2022.    His visit today was changed to telehealth due to extremely cold weather today.    Since his last visit he lost his libre reader and out of desperation downloaded the app on his phone, he continues to use the [place] app despite finding his reader 2 weeks later.     His PCP drew his A1c last month and it was between 6.0-6.9%.     He continues to receive his Ozempic, tresiba and novolog through [place] patient assistance.      In May 2021 Jardiance was DC'd due to renal decline. He has established with [facility].      Type 2 Diabetes mellitus  Dx: ~2010     Last A1c was 6.x% in November 2022     Current DM regimen: Ozempic 1 mg every Friday, Tresiba 32 units every morning, Novolog + 1 unit for every 50 > 150  Past treatment: Glimepiride- stopped during hospitalization, Jardiance- renal decline  Adherence to medications: all the time  FSBG frequency: FreeStyle Libre  Hyperglycemia: rare  Hypoglycemia: down to 69 overnight once/ month   Hypoglycemia unawareness?: no  Meals per day / Carb intake: 3 and occasional snack  Exercise: Home Health PT     Complications of DM:  ? CAD: YES, with ablation and stenting, and pacemaker, HF with preserved EF  ? CVA: No  ? PVD: No  ? Amputations: No  ? Retinopathy: No  ? Gastropathy: No  ? Nephropathy: YES  ? Neuropathy: No  ? Depression: No  ? Chronic wounds / delayed healing: YES, has had recent cellulitis--August 2020  ? DM related hospitalizations: No     Last dilated eye exam: December 2021  Last dental exam: upcoming visit August 2022  Last DM education / nutritionist visit:      DM related medications:  Statin: Yes, atorvastatin 40 mg  ACE-I: No  ASA: No      12/21    1101     181  12/21    10pm    241  12/21    433pm  130  1221     0817     241  12/20    0737am             137               131pm    12/20    448pm  108               1004pm             170  12/19    0723     129               1123     124               436pm  129  1034pm             184  (Christmas celebration)    12/18    0744     184               0829     244               1126     122               458       115               614pm  215               840pm  240           Review of Systems    14 point review of systems was negative    Objective:         ? allopurinol (ZYLOPRIM) 100 mg tablet Take 100 mg by mouth twice daily.   ? aspirin EC (ASPIR-LOW) 81 mg tablet Take one tablet by mouth daily. Take with food.   ? atorvastatin (LIPITOR) 40 mg tablet Take one tablet by mouth daily.   ? calcium carbonate (TUMS) 500 mg (200 mg elemental calcium) chewable tablet Chew 500 mg by mouth as Needed. Indications: heartburn   ? carvediloL (COREG) 25 mg tablet TAKE ONE TABLET BY MOUTH TWICE DAILY WITH MEALS. TAKE WITH FOOD.   ? cholecalciferol (vitamin D3) (OPTIMAL D3) 50,000 units capsule Take 1 capsule by mouth every 7 days.   ? FREESTYLE LITE STRIPS test strip Use one strip as directed before meals and at bedtime.   ? furosemide (LASIX) 80 mg tablet Take one tablet by mouth every morning. Indications: visible water retention   ? gabapentin (NEURONTIN) 100 mg capsule Take 1 capsule by mouth at bedtime daily.   ? insulin aspart (U-100) (NOVOLOG FLEXPEN U-100 INSULIN) 100 unit/mL (3 mL) PEN Inject 1 unit for every 50 > 150. Up to 30 units/day   ? insulin degludec (TRESIBA FLEXTOUCH U-100) 100 unit/mL (3 mL) injection pen Inject thirty five Units under the skin daily. (Patient taking differently: Inject 32 Units under the skin daily.)   ? levothyroxine (SYNTHROID) 75 mcg tablet Take 75 mcg by mouth daily.   ? metOLazone (ZAROXOLYN) 2.5 mg tablet Take 1 tablet by mouth as directed.  Take once weekly or take additional dose if weight is about 248 pounds.   ? nitroglycerin (NITROSTAT) 0.4 mg tablet Place 1 tablet under tongue every 5 minutes as needed for Chest Pain.   ? pen needle, diabetic (RELION PEN NEEDLES) 32 gauge x 5/32 pen needle Use one each as directed before meals and at bedtime. Use with insulin injections.   ? potassium chloride (K-TAB) 20 mEq tablet Take one tablet by mouth daily. Take additional tablet when taking metalozone   ? pramipexole (MIRAPEX) 1.5 mg PO Tab Take 1.5 mg by mouth at bedtime daily.   ? semaglutide (OZEMPIC) 1 mg/dose (4 mg/3 mL) injection PEN Inject 1 mg under the skin every 7 days.   ? tamsulosin (FLOMAX) 0.4 mg capsule TAKE 1 CAPSULE BY MOUTH AT BEDTIME     There were no vitals filed for this visit.  There is no height or weight on file to calculate BMI.      Telehealth Patient Reported Vitals     Row Name 03/16/21 1500  Weight: 108.9 kg (240 lb)        Height: 167.6 cm (5' 6)        Pain Score: Zero                    Physical Exam  Constitutional:       Appearance: Normal appearance.   Pulmonary:      Effort: Pulmonary effort is normal.   Skin:     General: Skin is warm and dry.   Neurological:      Mental Status: He is alert and oriented to person, place, and time.   Psychiatric:         Mood and Affect: Mood normal.         Behavior: Behavior normal.         Thought Content: Thought content normal.              Assessment and Plan:  Diabetes mellitus type 2, controlled   ? A1c 6.X% in November 2022  ? Target A1c < 8% without hypoglycemia, with co-morbidities  ? Currently on Ozempic 1 mg every Friday, Tresiba 32 units every morning, Novolog + 1 unit for every 50 > 150  ? Complicated by: Nephropathy and CAD     Plan:  ? Patient with excellent glucose control, rarely having pp hyperglycemia   ? Continue Tresiba 32 units daily--receives through patient assistance program  ? Continue Ozempic 1 mg every Friday--receives through patient assistance program  ? Continue NovoLog 1 unit for every 50 > 150  ? Due to renal decline his [place] was DC'd.  GFR needs to remain >30 to provide glucose lowering benefit  ? Continue freestyle libre 2, receiving through diabetes supply center     Diabetic complication assessment:   ? Annual Dilated eye exam: December 2021  ? Annual labs (electrolytes and renal function): Reviewed, last done September 2022, following with nephrology  ? Annual Urine microalbumin/Cr: N/A CKD  ? Foot exam / monofilament exam (recommended annually): September 2021     Supportive care for diabetes:  ? Diabetic Educator (recommended annually): Consider in the future  ? Nutritionist:      Hypovitaminosis D  ? Currently taking Vitamin D3 50,000 units every 7 days  ? Vitamin D: 81 in September, 2022        Lipids: Hyperlipidemia    ? Recommendations for Statin treatment in diabetes:   ? 22-75 yo: No risk factors - moderate dose statin.  CVD risk factors or overt CVD - high dose statin.    ? CVD risk factors include LDL cholesterol ?100 mg/dL (2.6 mmol/L), high blood pressure, and overweight   ? Currently on a statin: atorvastatin 40 mg  ? Annual fasting lipid panel due:  January 2023     Blood pressure: Hypertension   ? Controlled   ? ACE-I or ARB: No        RTC 5-6 months with me in person at [facility]           Patient Instructions     It was nice to see you today!    Goals we discussed today:     Keep up the great work!    Continue Tresiba 32 units daily    Continue Ozempic 1 mg each week    Continue Novolog correction scale as needed    Continue FreeStyle Libre 2      Please contact [facility] for any questions or abnormal glucose values (  above 300 or less than 70 repeatedly).  [phone]   My nurse, Alexa, can be reached at [phone]      Helpful Resources:  Books  - Bright [employer] and [employer] by Denice Bors, free online download, paper back $6.42, kindle $1.99  - Diabetes Friendly Table (cookbook) by [employer] [place] and Vivia Ewing MS RD CDE    Online:  - [email or web address], learn about what's new in diabetes, sign up for their weekly email  - Diabetes.org, the American Diabetes Association's website is full of great information and resources  - AffordableSalon.es, online cooking class, options $10/ month or $50/ 60 day course      Health goals for a person with diabetes to avoid complications are:    Check your FingerStick blood glucose:  Each time you take medications for your glucose, goals are:   Fasting and pre-meal glucose:  70-130 mg/dl   Bedtime blood glucose: 90-180 mg/dl    LDL Cholesterol:  <295 mg/dl   Triglycerides: <621 mg/dl   HDL >30 for men, >86 for women   Blood pressure:  Less than 130/90 mm HG  Maintain a Healthy Weight , Exercise: 30 minutes 5 times per week    Foot care:  Take good care of your feet.  Never cut your nails close to the tips of the toes. Promptly call your doctor if you have an infection, ulcer or cut anywhere on your feet. Wear shoes with a wide toe box and that fit comfortably.  Avoid walking barefoot.     Your recent lab values:    Glucose, POC   Date/Time Value Ref Range Status   11/10/2020 03:08 PM 104  Final     Cholesterol   Date/Time Value Ref Range Status   03/29/2020 12:00 AM 135  Final     HDL   Date/Time Value Ref Range Status   03/29/2020 12:00 AM 37 (L) >40 Final     LDL   Date/Time Value Ref Range Status   03/29/2020 12:00 AM 65  Final     Triglycerides   Date/Time Value Ref Range Status   03/29/2020 12:00 AM 166 (H) <150 Final       [facility] Classes and Education Opportunities in 2023:             One-on-one education with a Certified Diabetes Care and Research scientist (life sciences) ( In-person or Telehealth Visit)*     Personal education tailored your needs. 1 hour initial visit; 30-minute follow-ups; various days/times   Offered at multiple locations:    [employer] [facility], [place] 5th Floor: [address], [place], [place] [address]  [facility]: [address] [place], [place] [address]  [facility]: [address], [place], [place] [address]    Diabetes Self-Management Program: Group classes 2023* Comprehensive diabetes education designed for those that are newly diagnosed or those in need of a refresher. In group discussion format using [employer] - need doctor prescription and insurance approval.  Four classes or eight classes held over two months.  Patients are encouraged to schedule a one-on-one with a dietitian during this time as well for help creating a personalized meal plan.    2023: Monday Evening Classes 6-8pm (2nd and 4th Monday at [facility]) (Telehealth group)    Jan-Feb 2023 Mar-Apr 2023   May-Jun 2023  Aug-Sep 2023  Oct-Nov 2023  Class 1: Jan 9 Class 1: Mar 13,   Class 1: May 8  Class 1: Aug 14,   Class 1: Oct 9    Class 2: Jan 23 Class 2: Mar 27    Class 2: May 22  Class 2: Aug 28  Class 2: Oct 23  Class 3: Feb 13  Class 3: Apr 10     Class 3: Jun 12  Class 3: Sep 11  Class 3: Nov 13  Class 4: Feb 27  Class 4: Apr 24     Class 4: Jun 26  Class 4: Sep 25  Class 3: Feb 19 2022: Tuesday Afternoon Classes 12- 1 pm  (1 hr of 8 classes at [facility]) (Telehealth group)    Jan-Feb 2023    -  Mar-Apr 2023           May-Jun 2023  Class 1: Jan 10 Class 5: Feb 7     Class 1: Mar 7 Class 5: Apr 4     Class 1: May 2 Class 5: May 30  Class 2: Jan 17 Class 6: Feb 14     Class 2: Mar 14 Class 6: Apr 11     Class 2: May 9  Class 6: Jun 6  Class 3: Jan 24 Class 7: Feb 21     Class 3: Mar 21 Class 7:  Apr 18    Class 3: May 16 Class 7: Jun 13  Class 4: Jan 31 Class 8: Feb 28     Class 4: Mar 28 Class 8: Apr 25    Class 4: May 23 Class 8: Jun 20      *There is a charge for this class. Most insurance companies require a referral from your doctor. Contact your insurance company to determine coverage for diabetes education.     To schedule Diabetes Education please call [phone]      Free Online Programs 2023   How to register to our free classes:    - For patients and visitors Visit: Events & Programs - [facility] System or Free [facility] Classes  - For [employer] employees visit: Event Calendar - 24/7 ([email or web address]) or  - Scan QR code to register to the class. You will receive Zoom link after registration    (All classes are subject to change; however, you will be notified of any changes after the registration)    1. Monthly Diabetes Survival Skills: Providing basic survival skills with information tailored to meet your needs. It's also great for people who want to refresh their diabetes knowledge after completing diabetes group classes-- Every 1st Tue  of each month from 1.30-2.30 pm (*except Jul)  Date offered: Jan 3     Feb 7 Mar 7  Apr 4  May 2  Jun 6         Jul 11*      Aug 1   Sep 5  Oct 3  Nov 7  Dec 5     Register Here: [email or web address]        2.  Monthly Cooking with [facility]:  Free [employer] with [facility] offering healthy, easy home cooking recipes by registered dietitian who is specialized in diabetes and weight management-- Every 4th Wed of each month from 12-1 pm (*except Jun and Dec)  Date offered: Jan 25    Feb 22 Mar 22       Apr 26 May 24  Jun 21*  Jul 26    Aug 23 Sep 27       Oct  25 Nov 22  Dec 20*    Register Here: [email or web address]         3. Monthly Diabetes Support Group : Ongoing diabetes education & support for people with diabetes and family-- Every 1st Wed of each month, 6-7:30 pm   Date offered:              Jul 5 Aug 2  Sep 6  Oct 4  Nov 1  Dec 6     Register Here: [email or web address]        4. Monthly Type 1 Diabetes Meet Up Group: Ongoing diabetes education & support specifically for people with type 1 diabetes and family-- Every 2nd Tue of each month from 4-5 pm   Date offered:  Jan 10  Feb 14  Mar14  Apr11  May 9  Jun 13        July 11  Aug 8  Sep 12  Oct 10  Nov 14  Dec 12    Register Here: [email or web address]        5. (New class) Monthly Anti-inflammatory Diet: Learn how food can help your body have an appropriate inflammatory response and lower chronic inflammation. Learn principles of choosing foods, practical meal planning tools and tips for grocery shopping-- Every 3rd Tue of each month from 1.30-2.30 pm   Date offered: Jan 17  Feb 21  Mar 21     Apr 18 May 16  Jun 20             July 18  Aug 15  Sep 19     Oct 17 Nov 21  Dec 19  Register here: [email or web address]        6.  (New class) Monthly Diabetes and Kidney Health: Learn how to follow both a consistent carbohydrate diet for diabetes and a kidney health diet for any stage of Chronic Kidney Disease (CKD) without feeling deprived-- Every 3rd Mon of each month from 12-1 pm (*except Jan)  Date offered: Jan 23* Feb 20  Mar 20  Apr 17  May 15  Jun 19             July 17 Aug 21  Sep 18  Oct 16  Nov 20  Dec 18    Register here: [email or web address]        7.  (New class starting in March) Monthly Prediabetes and Diabetes Prevention: Learn about lifestyle and diet changes to prevent type II diabetes and decrease insulin resistance-- Every 1st  Thu of each month from 3-4 pm   Date offered:  Mar 2  Apr 6  May 4  Jun 1   July 6     Aug 3  Sep 7  Oct 5  Nov 2  Dec 7  Register here: [email or web address]        8.  Weight management and Type 2 Diabetes: Learn about insulin resistance and the link with weight and type 2 diabetes. Review general diet guidelines, over the counter supplements, prescription weight loss medications and bariatric surgery-- Every 2 months on 4th Tue from 12-1 pm  Date offered: Jan 24  Mar 28  May 23    Jul 25  Sep 26  Nov 28   Register here: [email or web address]        9.  Introduction to Low Carb and Keto Diet Plans in Diabetes Management:  This class will help you understand the low Carb and Keto Diet Plans and learn if Keto diet plan is appropriate for you in  helping with diabetes management -Every 2 months on 3rd Mon from 12-1 pm (*except Apr)  Date offered:  Feb 20  Apr 10*  Jun 19    Aug 21  Oct 16  Dec 18    Register here: [email or web address]    10. Wellness Series: Regardless of your current health, everyone can benefit from knowing how to maintain healthy blood sugar from diet and lifestyle and using these tools for life. --On selected Tue from 12-1 PM     10.1  Nutrition for Balanced Blood Sugar: We will discuss how food quality and macronutrients impact blood sugar and learn how to create balanced meals for optimal blood sugar control.  Date offered:  Jan 31   May 23   Sep 19  Register here: [email or web address]      10.2 Moving for Better Health: Come learn about current exercise recommendations, health benefits of exercise, and how to get started with goal setting. Bring your questions, barriers to exercise and share helpful tips for how you have implemented exercise into your life.    Date offered:  Feb 28   Jun 20   Oct 31  Register here: [email or web address]      10.3 Stress and Your Health: We will discuss the various causes of stress, how stress impacts health and healthy coping strategies to reduce stress.    Date offered:  Mar 28   Jul 25   Nov 21  Register here: [email or web address]      10.4 Sleep and Your Health: We will discuss the importance of sleep, health consequences of inadequate sleep and tips for how to improve sleep quality and quantity.    Date offered: Apr 25   Aug 22   Dec 19  Register here: [email or web address]        Future Appointments   Date Time Provider Department Center   06/13/2021  9:30 AM Vanice Sarah, MD [facility] C[facility] Exam   07/17/2021 11:30 AM Abagail Kitchens, MBBS [facility]                              30 minutes spent on this patient's encounter with counseling and coordination of care taking >50% of the visit.

## 2021-03-17 ENCOUNTER — Encounter: Admit: 2021-03-17 | Discharge: 2021-03-17 | Payer: MEDICARE

## 2021-03-23 ENCOUNTER — Encounter: Admit: 2021-03-23 | Discharge: 2021-03-23 | Payer: MEDICARE

## 2021-04-11 ENCOUNTER — Encounter: Admit: 2021-04-11 | Discharge: 2021-04-11 | Payer: MEDICARE

## 2021-05-11 ENCOUNTER — Encounter: Admit: 2021-05-11 | Discharge: 2021-05-11 | Payer: MEDICARE

## 2021-05-12 ENCOUNTER — Encounter: Admit: 2021-05-12 | Discharge: 2021-05-12 | Payer: MEDICARE

## 2021-05-31 ENCOUNTER — Encounter: Admit: 2021-05-31 | Discharge: 2021-05-31 | Payer: MEDICARE

## 2021-05-31 DIAGNOSIS — E1122 Type 2 diabetes mellitus with diabetic chronic kidney disease: Secondary | ICD-10-CM

## 2021-06-02 ENCOUNTER — Encounter: Admit: 2021-06-02 | Discharge: 2021-06-02 | Payer: MEDICARE

## 2021-06-02 NOTE — Progress Notes
Attempted to contact patient by phone regarding lab orders for CMP and Lipid Panel that have not been completed. No answer on patient's phone. Left message on patient's voicemail regarding lab orders and fasting instructions. Faxed lab orders to [facility] ([place]: [phone]).

## 2021-06-05 ENCOUNTER — Encounter: Admit: 2021-06-05 | Discharge: 2021-06-05 | Payer: MEDICARE

## 2021-06-05 DIAGNOSIS — E1165 Type 2 diabetes mellitus with hyperglycemia: Secondary | ICD-10-CM

## 2021-06-05 LAB — COMPREHENSIVE METABOLIC PANEL
ALK PHOSPHATASE: 75
ALT: 29
ANION GAP: 9
AST: 18
CHLORIDE: 114 — ABNORMAL HIGH (ref 98–107)
CO2: 23
GFR ESTIMATED: 37 — ABNORMAL LOW (ref >59)
POTASSIUM: 4.6
SODIUM: 146 — ABNORMAL HIGH (ref 136–145)

## 2021-06-05 LAB — LIPID PROFILE
CHOLESTEROL/HDL %: 4
CHOLESTEROL: 145 — ABNORMAL HIGH (ref 8.4–25.7)
HDL: 35 — ABNORMAL LOW (ref >40)
LDL: 75 — ABNORMAL LOW (ref 8.8–10.0)
TRIGLYCERIDES: 179 — ABNORMAL HIGH (ref <150)
VLDL: 36 — ABNORMAL LOW (ref 6.2–8.1)

## 2021-06-13 ENCOUNTER — Encounter: Admit: 2021-06-13 | Discharge: 2021-06-13 | Payer: MEDICARE

## 2021-06-13 DIAGNOSIS — I2510 Atherosclerotic heart disease of native coronary artery without angina pectoris: Secondary | ICD-10-CM

## 2021-06-13 DIAGNOSIS — I480 Paroxysmal atrial fibrillation: Secondary | ICD-10-CM

## 2021-06-13 DIAGNOSIS — G4730 Sleep apnea, unspecified: Secondary | ICD-10-CM

## 2021-06-13 DIAGNOSIS — E7800 Pure hypercholesterolemia, unspecified: Secondary | ICD-10-CM

## 2021-06-13 DIAGNOSIS — I5032 Chronic diastolic (congestive) heart failure: Secondary | ICD-10-CM

## 2021-06-13 DIAGNOSIS — E119 Type 2 diabetes mellitus without complications: Secondary | ICD-10-CM

## 2021-06-13 DIAGNOSIS — I495 Sick sinus syndrome: Secondary | ICD-10-CM

## 2021-06-13 DIAGNOSIS — I10 Essential (primary) hypertension: Secondary | ICD-10-CM

## 2021-06-13 DIAGNOSIS — I4891 Unspecified atrial fibrillation: Secondary | ICD-10-CM

## 2021-06-13 DIAGNOSIS — E782 Mixed hyperlipidemia: Secondary | ICD-10-CM

## 2021-06-13 NOTE — Progress Notes
Date of Service: 06/13/2021    Paul Benjamin is a 76 y.o. male.       HPI     Paul Benjamin was in the [facility] clinic today, accompanied by his wife.  He is followed for coronary disease, atrial fibrillation, and diastolic heart failure.  He had previously been a patient of Dr. Pierre Bali and I saw him for the first time last September.    He uses a walker and does not get out a lot in the winter months.  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    His cardiac situation seems to be stable.  He had typical angina prior to his coronary stent procedure over 15 years ago.  He has had no recurrence of those symptoms and has had stress testing intermittently over the years.  Similarly, his diastolic heart failure appears to be pretty stable on the current medical program.  His exertional breathlessness has not worsened and his weight remains quite stable.    His atrial fibrillation was never symptomatic but he has a pacemaker and there is been no evidence of recurrent arrhythmia after his ablation procedure.    He is not having any problems with TIA or stroke symptoms.  The decision had been made previously to discontinue oral anticoagulation after his successful ablation procedure and based on the lack of recurrence evidenced by device checks.         Vitals:    06/13/21 0905   BP: 110/62   BP Source: Arm, Left Upper   Pulse: 85   SpO2: 95%   O2 Percent: 95 %   O2 Device: None (Room air)   PainSc: Four   Weight: 114.9 kg (253 lb 3.2 oz)   Height: 177.8 cm (5' 10)     Body mass index is 36.33 kg/m?Marland Kitchen     Past Medical History  Patient Active Problem List    Diagnosis Date Noted   ? Venous stasis dermatitis of both lower extremities 05/23/2020   ? Obesity, morbid (HCC) 05/23/2020   ? Hypercholesterolemia 05/23/2020   ? Controlled type 2 diabetes mellitus with complication, with long-term current use of insulin (HCC) 04/28/2020   ? 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 Lower extremity edema 12/03/2019   ? Hypovitaminosis D 12/03/2019   ? Severe sepsis (HCC) 11/10/2018   ? Acute respiratory failure with hypoxia (HCC) 10/24/2018   ? Hyperglycemia 10/24/2018   ? Pneumonia due to COVID-19 virus 10/22/2018   ? 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 Preoperative clearance 06/26/2013   ? 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 PLMD (periodic limb movement disorder) 09/29/2008   ? 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 SSS (sick sinus syndrome) (HCC)      09/30/06 Dual Chamber PPM Initial ImplantMedtronic generator model #[id number] Medtronic atrial lead model #4076, Medtronic RV lead model #4076,      ? A-fib (HCC) 09/30/2006     Brief episode of atrial fibrillation after the stenting.         a.  Recurrent bouts of atrial fibrillation but completely asymptomatic.  3/09:  Hospital admission for sotalol initiation and cardioversion  09/23/07 A fib ablation:  Pulmonary vein antral isolation, ligament of marshall ablation, posterior roof line, cavotricuspid isthmus ablation  6/09 event monitor:  No recurrent AF     ? Tachy-brady syndrome (HCC) 09/30/2006  8/08 s/p dual chamber PPM implant.  Implanted device is a MDT Adapta [id number]     ? CAD (coronary artery disease) 09/30/2006     01/21/04 Heart catheterization - Angioplasty and stenting of the anterior descending coronary artery on 01/21/04 with a Cypher drug-eluting stent used for the LAD. The right coronary and circumflex showed only minimal disease. The ejection fraction was 45-50%.   07/18/2004:  Thallium showing EF of 53% with minimal amount of ischemia involving the               inferolateral segment.  3/09 adenosine thallium stress:  This study suggests a low likelihood for ischemic/jeopardized myocardium. No definite fixed or reversible defects are evident. High risk scintigraphic features are absent. Although the calculated LV ejection fraction was reduced, by visual estimation, the EF would appear at the lower limits of normal. The left ventricle is within normal limits for size.     7/11 thallium:  SUMMARY/OPINION: This study is probably normal. There is a small defect involving the inferior wall. The majority of the defect is fixed while more distally there is a subtle degree of reversibility. Overall I think this probably represents diaphragmatic attenuation though a very slight degree of ischemia cannot be completely excluded. The left ventricular systolic function is normal with a calculated ejection fraction of 61 percent. The end diastolic volume is normal at 100 mL. The pulmonary to myocardial count ratio is normal at 0.36. There is no transient ischemic dilatation.   This study was compared to a myocardial perfusion study obtained in our office on May 26, 2007. The prior study demonstrated an EF of 43 percent though visually it was felt to be in more of the 50-55 percent range. The prior study also demonstrated a perfusion abnormality involving the inferior wall which had some mild reversibility but overall was felt to be due to diaphragmatic attenuation.   In comparing the two studies qualitatively, both demonstrated an inferior wall defect. The prior study had more reversibility involving the basal to mid section while the current study has some mild reversibility involving the distal section. Overall I think both studies probably represent diaphragmatic attenuation.     05/03/2015 - Stress Test:  ? This study is probably normal with inferior attenuation with a small sized mild intensity reversible defect in the basal inferior wall consistent with? diaphragmatic attenuation. Left ventricular systolic function is normal. There are no high risk prognostic indicators present.? The ECG portion of the study is negative for ischemia.         ? Hypertension 09/30/2006   ? Mixed dyslipidemia 09/30/2006   ? Sleep apnea 09/30/2006         Review of Systems Constitutional: Positive for malaise/fatigue.   HENT: Negative.    Eyes: Negative.    Cardiovascular: Positive for dyspnea on exertion and leg swelling.   Respiratory: Negative.    Endocrine: Negative.    Hematologic/Lymphatic: Negative.    Skin: Negative.    Musculoskeletal: Positive for joint pain, muscle cramps, muscle weakness and myalgias.   Gastrointestinal: Negative.    Genitourinary: Positive for nocturia.   Neurological: Positive for excessive daytime sleepiness and weakness.   Psychiatric/Behavioral: Negative.    Allergic/Immunologic: Negative.        Physical Exam    Physical Exam   General Appearance: no distress   Skin: warm, no ulcers or xanthomas   Digits and Nails: no cyanosis or clubbing   Eyes: conjunctivae and lids normal, pupils are equal and  round   Teeth/Gums/Palate: dentition unremarkable, no lesions   Lips & Oral Mucosa: no pallor or cyanosis   Neck Veins: normal JVP , neck veins are not distended   Thyroid: no nodules, masses, tenderness or enlargement   Chest Inspection: chest is normal in appearance   Respiratory Effort: breathing comfortably, no respiratory distress   Auscultation/Percussion: lungs clear to auscultation, no rales or rhonchi, no wheezing   PMI: PMI not enlarged or displaced   Cardiac Rhythm: regular rhythm and normal rate   Cardiac Auscultation: S1, S2 normal, no rub, no gallop   Murmurs: no murmur   Peripheral Circulation: normal peripheral circulation   Carotid Arteries: normal carotid upstroke bilaterally, no bruits   Radial Arteries: normal symmetric radial pulses   Abdominal Aorta: no abdominal aortic bruit   Pedal Pulses: normal symmetric pedal pulses   Lower Extremity Edema: no lower extremity edema   Abdominal Exam: soft, non-tender, no masses, bowel sounds normal   Liver & Spleen: no organomegaly   Gait & Station: walks with a walker  Muscle Strength: normal muscle tone   Orientation: oriented to time, place and person   Affect & Mood: appropriate and sustained affect   Language and Memory: patient responsive and seems to comprehend information   Neurologic Exam: neurological assessment grossly intact   Other: moves all extremities      Cardiovascular Health Factors  Vitals BP Readings from Last 3 Encounters:   06/13/21 110/62   12/22/20 92/52   12/05/20 129/71     Wt Readings from Last 3 Encounters:   06/13/21 114.9 kg (253 lb 3.2 oz)   12/22/20 109.8 kg (242 lb)   12/05/20 110 kg (242 lb 8 oz)     BMI Readings from Last 3 Encounters:   06/13/21 36.33 kg/m?   12/22/20 34.72 kg/m?   12/05/20 34.80 kg/m?      Smoking Social History     Tobacco Use   Smoking Status Former   ? Packs/day: 1.00   ? Types: Cigarettes   ? Quit date: 03/26/1966   ? Years since quitting: 55.2   Smokeless Tobacco Never      Lipid Profile Cholesterol   Date Value Ref Range Status   06/05/2021 145  Final     HDL   Date Value Ref Range Status   06/05/2021 35 (L) >40 Final     LDL   Date Value Ref Range Status   06/05/2021 75  Final     Triglycerides   Date Value Ref Range Status   06/05/2021 179 (H) <150 Final      Blood Sugar Hemoglobin A1C   Date Value Ref Range Status   04/17/2021 6.2  Final     Glucose   Date Value Ref Range Status   06/05/2021 90  Final   12/27/2020 126 (H) 70 - 105 Final   12/20/2020 113  Final   01/06/2016 113 (H) 70 - 100 mg/dL Final   16/12/9602 540 (H) 70 - 110 MG/DL Final   98/01/9146 829 (H) 70 - 110 MG/DL Final     Glucose, POC   Date Value Ref Range Status   11/10/2020 104  Final   07/28/2020 212  Final   12/03/2019 154  Final          Problems Addressed Today  Encounter Diagnoses   Name Primary?   ? Chronic heart failure with preserved ejection fraction (HFpEF) (HCC)    ? Paroxysmal atrial fibrillation (HCC)    ?  Obesity, morbid (HCC)    ? Coronary artery disease involving native coronary artery of native heart without angina pectoris    ? Mixed dyslipidemia    ? Primary hypertension        Assessment and Plan       CAD (coronary artery disease)  He presented with typical exertional angina leading to unstable angina.  He's had no further angina symptoms since his PCI in 2005.  His last surveillance stress test was about 3 1/2 years ago--I ordered a regadenson thallium at [facility] so that we can use the SPECT scanner.    A-fib (HCC)  His AF was asymptomatic, but he hasn't had any detected on his PPM checks since his ablation.    Mixed dyslipidemia  Lab Results   Component Value Date    CHOL 145 06/05/2021    TRIG 179 (H) 06/05/2021    HDL 35 (L) 06/05/2021    LDL 75 06/05/2021    VLDL 36 06/05/2021    NONHDLCHOL 94 12/03/2019    CHOLHDLC 4 06/05/2021      LDL close to goal.    Hypertension  Home BP averages 130's/70's.    Chronic heart failure with preserved ejection fraction (HFpEF) (HCC)  Weight stable, no increase in exertional dyspnea.      Current Medications (including today's revisions)  ? allopurinol (ZYLOPRIM) 100 mg tablet Take one tablet by mouth twice daily.   ? aspirin EC (ASPIR-LOW) 81 mg tablet Take one tablet by mouth daily. Take with food.   ? atorvastatin (LIPITOR) 40 mg tablet Take one tablet by mouth daily.   ? calcium carbonate (TUMS) 500 mg (200 mg elemental calcium) chewable tablet Chew one tablet by mouth as Needed. Indications: heartburn   ? carvediloL (COREG) 25 mg tablet TAKE ONE TABLET BY MOUTH TWICE DAILY WITH MEALS. TAKE WITH FOOD.   ? cholecalciferol (vitamin D3) (OPTIMAL D3) 50,000 units capsule Take one capsule by mouth every 7 days.   ? docusate (STOOL SOFTENER) 100 mg capsule Take one capsule by mouth daily.   ? ferrous sulfate (FEOSOL) 325 mg (65 mg iron) tablet Take one tablet by mouth twice daily.   ? FREESTYLE LITE STRIPS test strip Use one strip as directed before meals and at bedtime.   ? furosemide (LASIX) 80 mg tablet Take one tablet by mouth every morning. Indications: visible water retention   ? gabapentin (NEURONTIN) 100 mg capsule Take one capsule by mouth at bedtime daily.   ? insulin aspart (U-100) (NOVOLOG FLEXPEN U-100 INSULIN) 100 unit/mL (3 mL) PEN Inject 1 unit for every 50 > 150. Up to 30 units/day   ? insulin degludec (TRESIBA FLEXTOUCH U-100) 100 unit/mL (3 mL) subcutaneous PEN Inject thirty five Units under the skin daily. (Patient taking differently: Inject thirty two Units under the skin daily.)   ? levothyroxine (SYNTHROID) 75 mcg tablet Take one tablet by mouth daily.   ? losartan (COZAAR) 25 mg tablet Take one tablet by mouth daily.   ? metOLazone (ZAROXOLYN) 2.5 mg tablet Take 1 tablet by mouth as directed.  Take once weekly or take additional dose if weight is about 248 pounds.   ? nitroglycerin (NITROSTAT) 0.4 mg tablet Place 1 tablet under tongue every 5 minutes as needed for Chest Pain.   ? pen needle, diabetic (RELION PEN NEEDLES) 32 gauge x 5/32 pen needle Use one each as directed before meals and at bedtime. Use with insulin injections.   ? potassium chloride (K-TAB) 20 mEq tablet  Take one tablet by mouth daily. Take additional tablet when taking metalozone   ? pramipexole (MIRAPEX) 1.5 mg PO Tab Take one tablet by mouth at bedtime daily.   ? semaglutide (OZEMPIC) 1 mg/dose (4 mg/3 mL) injection PEN Inject 1 mg under the skin every 7 days.   ? tamsulosin (FLOMAX) 0.4 mg capsule Take two capsules by mouth at bedtime daily.     Total time spent on today's office visit was 45 minutes.  This includes face-to-face in person visit with patient as well as nonface-to-face time including review of the EMR, outside records, labs, radiologic studies, echocardiogram & other cardiovascular studies, formation of treatment plan, after visit summary, future disposition, and lastly on documentation.

## 2021-06-13 NOTE — Assessment & Plan Note
His AF was asymptomatic, but he hasn't had any detected on his PPM checks since his ablation.

## 2021-06-13 NOTE — Assessment & Plan Note
Home BP averages 130's/

## 2021-06-13 NOTE — Assessment & Plan Note
He presented with typical exertional angina leading to unstable angina.  He's had no further angina symptoms since his PCI in 2005.  His last surveillance stress test was about 3 1/2 years ago--I ordered a regadenson thallium at [facility] so that we can use the SPECT scanner.

## 2021-06-13 NOTE — Assessment & Plan Note
Lab Results   Component Value Date    CHOL 145 06/05/2021    TRIG 179 (H) 06/05/2021    HDL 35 (L) 06/05/2021    LDL 75 06/05/2021    VLDL 36 06/05/2021    NONHDLCHOL 94 12/03/2019    CHOLHDLC 4 06/05/2021      LDL close to goal.

## 2021-06-13 NOTE — Assessment & Plan Note
Weight stable, no increase in exertional dyspnea.

## 2021-06-19 ENCOUNTER — Ambulatory Visit: Admit: 2021-06-19 | Discharge: 2021-06-20 | Payer: MEDICARE

## 2021-06-19 ENCOUNTER — Encounter: Admit: 2021-06-19 | Discharge: 2021-06-19 | Payer: MEDICARE

## 2021-06-19 DIAGNOSIS — Q612 Polycystic kidney, adult type: Secondary | ICD-10-CM

## 2021-06-19 DIAGNOSIS — I2510 Atherosclerotic heart disease of native coronary artery without angina pectoris: Secondary | ICD-10-CM

## 2021-06-19 DIAGNOSIS — E1122 Type 2 diabetes mellitus with diabetic chronic kidney disease: Secondary | ICD-10-CM

## 2021-06-19 DIAGNOSIS — Z8249 Family history of ischemic heart disease and other diseases of the circulatory system: Secondary | ICD-10-CM

## 2021-06-19 DIAGNOSIS — E119 Type 2 diabetes mellitus without complications: Secondary | ICD-10-CM

## 2021-06-19 DIAGNOSIS — G4730 Sleep apnea, unspecified: Secondary | ICD-10-CM

## 2021-06-19 DIAGNOSIS — I10 Essential (primary) hypertension: Secondary | ICD-10-CM

## 2021-06-19 DIAGNOSIS — I495 Sick sinus syndrome: Secondary | ICD-10-CM

## 2021-06-19 DIAGNOSIS — E7800 Pure hypercholesterolemia, unspecified: Secondary | ICD-10-CM

## 2021-06-19 DIAGNOSIS — I4891 Unspecified atrial fibrillation: Secondary | ICD-10-CM

## 2021-06-19 NOTE — Progress Notes
Date of Service: 06/19/2021    Subjective and interval events:          The patient doing well. Denies new complaints except for worsening urinary symptoms of urgency, dribbling, and nocturia.  The patient did notice slightly increased swelling in his LEs.  The patient's BP at home is 120 - 130s systolic.  The patient denies headache, confusion, change of taste, loss of appetite, or significant weight changes.  The patient denies Hx of recurrent kidney stones  The patient denies taking NSAIDs  The pt was started on losartan and it's tolerated well, blood pressure controlled, no light-headedness or dizziness.    History of PKD complications:  ? History of hypertension: yes  ? History of UTI: no   ? History of kidney stones: no  ? History of liver cysts: yes  ? History of subarachnoid hemorrhage: no        History from initial visit in 02/2020:  History of present illness:  Paul Benjamin is a 76 y.o. male with PMHx of CAD, HFpEF, pAF, HLD, DM, and CKD IV presented to the clinic to establish care. The patient has Hx of DM for 10-15 years for which he is following with endocrinology. Currently the patient takes ozempic, short and long acting insulin. Jardiance was stopped 2/2 decrease in kidney function.  The patient HbA1C has been ~6-8  The patient denies Hx of diabetic retinopathy   The patient follows with cardiology for HFpEF who increased the lasix to daily (from EOD) and stopped chlorthalidone in 11/2019.  The patient reported recent worsening of LE edema  EDW 243 lbs  The patient was seen by a vascular specialist for the edema as the patient had previous vein stripping in both legs. The patient was given recommendations to keep legs elevated and wearing compression socks.  The patient has significant family Hx of ADPKD. His father had a brain aneurysm.      Review of Systems   Constitutional: Negative for activity change, appetite change, fatigue and fever.   HENT: Negative for congestion.    Eyes: Negative for pain, redness and visual disturbance.   Respiratory: Negative for cough, chest tightness, shortness of breath and wheezing.    Cardiovascular: Positive for leg swelling. Negative for chest pain and palpitations.   Gastrointestinal: Negative for abdominal pain, blood in stool, nausea and vomiting.   Endocrine: Negative for polyuria.   Genitourinary: Positive for difficulty urinating, frequency and urgency. Negative for decreased urine volume, flank pain and hematuria.   Musculoskeletal: Negative for arthralgias.   Skin: Negative for rash.   Neurological: Negative for dizziness, light-headedness and headaches.   Psychiatric/Behavioral: Negative for agitation.         Objective:         ? allopurinol (ZYLOPRIM) 100 mg tablet Take one tablet by mouth twice daily.   ? aspirin EC (ASPIR-LOW) 81 mg tablet Take one tablet by mouth daily. Take with food.   ? atorvastatin (LIPITOR) 40 mg tablet Take one tablet by mouth daily.   ? calcium carbonate (TUMS) 500 mg (200 mg elemental calcium) chewable tablet Chew one tablet by mouth as Needed. Indications: heartburn   ? carvediloL (COREG) 25 mg tablet TAKE ONE TABLET BY MOUTH TWICE DAILY WITH MEALS. TAKE WITH FOOD.   ? docusate (COLACE) 100 mg capsule Take one capsule by mouth daily.   ? ferrous sulfate (FEOSOL) 325 mg (65 mg iron) tablet Take one tablet by mouth twice daily.   ? FREESTYLE  LITE STRIPS test strip Use one strip as directed before meals and at bedtime.   ? furosemide (LASIX) 80 mg tablet Take one tablet by mouth every morning. Indications: visible water retention   ? gabapentin (NEURONTIN) 100 mg capsule Take one capsule by mouth at bedtime daily.   ? insulin aspart (U-100) (NOVOLOG FLEXPEN U-100 INSULIN) 100 unit/mL (3 mL) PEN Inject 1 unit for every 50 > 150. Up to 30 units/day   ? insulin degludec (TRESIBA FLEXTOUCH U-100) 100 unit/mL (3 mL) subcutaneous PEN Inject thirty five Units under the skin daily. (Patient taking differently: Inject thirty two Units under the skin daily.)   ? levothyroxine (SYNTHROID) 75 mcg tablet Take one tablet by mouth daily.   ? losartan (COZAAR) 25 mg tablet Take one tablet by mouth daily.   ? metOLazone (ZAROXOLYN) 2.5 mg tablet Take 1 tablet by mouth as directed.  Take once weekly or take additional dose if weight is about 248 pounds.   ? nitroglycerin (NITROSTAT) 0.4 mg tablet Place 1 tablet under tongue every 5 minutes as needed for Chest Pain.   ? pen needle, diabetic (RELION PEN NEEDLES) 32 gauge x 5/32 pen needle Use one each as directed before meals and at bedtime. Use with insulin injections.   ? potassium chloride (K-TAB) 20 mEq tablet Take one tablet by mouth daily. Take additional tablet when taking metalozone   ? pramipexole (MIRAPEX) 1.5 mg PO Tab Take one tablet by mouth at bedtime daily.   ? semaglutide (OZEMPIC) 1 mg/dose (4 mg/3 mL) injection PEN Inject 1 mg under the skin every 7 days.   ? tamsulosin (FLOMAX) 0.4 mg capsule Take two capsules by mouth at bedtime daily.     Vitals:    06/19/21 1048   BP: 126/63   BP Source: Arm, Left Upper   Pulse: 88   PainSc: Zero   Weight: 113.4 kg (250 lb)   Height: 177.8 cm (5' 10)     Body mass index is 35.87 kg/m?Marland Kitchen     Physical Exam  Vitals and nursing note reviewed.   Constitutional:       General: He is not in acute distress.     Appearance: Normal appearance. He is not ill-appearing.   HENT:      Head: Normocephalic and atraumatic.      Nose: Nose normal.   Eyes:      Pupils: Pupils are equal, round, and reactive to light.   Cardiovascular:      Rate and Rhythm: Normal rate and regular rhythm.      Pulses: Normal pulses.      Heart sounds: Normal heart sounds. No murmur heard.  Pulmonary:      Effort: Pulmonary effort is normal.      Breath sounds: Normal breath sounds.   Abdominal:      General: Abdomen is flat.      Palpations: Abdomen is soft.      Tenderness: There is no abdominal tenderness.   Musculoskeletal:      Right lower leg: Edema present.      Left lower leg: Edema present.   Skin:     General: Skin is warm and dry.      Capillary Refill: Capillary refill takes less than 2 seconds.      Findings: No rash.   Neurological:      General: No focal deficit present.      Mental Status: He is alert and oriented to person, place, and time.  Psychiatric:         Mood and Affect: Mood normal.          Assessment and Plan:  Paul Benjamin is a 76 y.o. 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  - CKD is likely 2/2 combination of DM, HTN, and ADPKD.  - His baseline creatinine around 2  - his Pr/Cr ratio is 0.7. Urine dipstick -ve for Pr.   - UA had pyuria with +ve LE without bacteria or nitrates >> Cx showed Candida.  - the patient's kidney function has been stable since 2019. Will continue monitoring kidney function.  - MM work-up negative    # DM  - currently on insulin and semaglutide. Jardiance stopped due to decreased kidney function.  - Hx of DM for 10-15 years.  - HbA1C ~6-8  - No Hx of retinopathy. Gets annual eye exam.  - follows with endorinology    # CAD  # HFpEF  - follows with cardiology    Plan:    Kidney function stable.    CKD risk factor control:  - DM management per endocrinology  - Wouldn't start SGLT2 for now as he doesn't have proteinuria on urine dipstick.  - will refer him to urology for worsening LUTS symptoms.  - Continue current dose of losartan 25 qday. Kidney function stable at baseline. May increase the dose on later visits.  - Avoid NSAIDs.    CKD management:  - D/C vitamin D  - CKD labs next visit.  - the patient had LE edema, he is wearing compression socks.    ADPKD  - The patient does not qualify for vaptan treatment given his age.  -We will monitor for any complications secondary to the disease [infection, stone, abdominal pain, bleeding, and hypertension] and treat accordingly.  -The patient is counseled that the disease is familial and his siblings and children need to discuss that with thier physicians.  - will get MRA brain given the family Hx of brain aneurysm    Follow up in 6 month.    Abagail Kitchens, PGY5  Nephrology fellow  Pager: [phone]

## 2021-06-20 DIAGNOSIS — R339 Retention of urine, unspecified: Secondary | ICD-10-CM

## 2021-06-21 ENCOUNTER — Encounter: Admit: 2021-06-21 | Discharge: 2021-06-21 | Payer: MEDICARE

## 2021-06-21 DIAGNOSIS — Z950 Presence of cardiac pacemaker: Secondary | ICD-10-CM

## 2021-06-21 NOTE — Telephone Encounter
-----   Message from Lillie Fragmin, RN sent at 06/21/2021  8:49 AM CDT -----  Regarding: RE: MRI  That works.  Thanks.   ----- Message -----  From: Levonne Hubert  Sent: 06/20/2021   2:29 PM CDT  To: [facility]  Subject: RE: MRI                                          5/31 0915 DC THEN 1000 MRA CA [place]  ----- Message -----  From: Lillie Fragmin, RN  Sent: 06/19/2021   4:58 PM CDT  To: Margreta Journey Raley-Gordon, [facility]  Subject: RE: MRI                                          This is 1.5T, 3T, [employer]. IS due for in office check, so Device check and then SM[employer].   Date?  ----- Message -----  From: Levonne Hubert  Sent: 06/19/2021   4:54 PM CDT  To: [facility]  Subject: MRI                                              Name: Paul Benjamin CBJSE "Fritz Pickerel"     Exam: MRA HEAD W/O CONTRAST     Ordering physician: Emilee Hero     Expected date or time frame: ADV OF JUNE     Paste device information below:      Pacemaker Dependant: no  On Anti-coag: no  MRI Conditional: yes              GENERATOR ATRIAL LEAD RV LEAD LV LEAD  Manufacturer: Medtronic  Medtronic  Medtronic      Model Number: Advisa DR MRI [id number] 4076-52cm 4076-58cm    Serial Number: [id number]    Implant Date: [id number] 09/30/2006 09/30/2006    Device Type: IPG

## 2021-06-23 ENCOUNTER — Encounter: Admit: 2021-06-23 | Discharge: 2021-06-23 | Payer: MEDICARE

## 2021-06-27 ENCOUNTER — Encounter: Admit: 2021-06-27 | Discharge: 2021-06-27 | Payer: MEDICARE

## 2021-06-27 ENCOUNTER — Ambulatory Visit: Admit: 2021-06-27 | Discharge: 2021-06-27 | Payer: MEDICARE

## 2021-06-27 DIAGNOSIS — I2510 Atherosclerotic heart disease of native coronary artery without angina pectoris: Secondary | ICD-10-CM

## 2021-06-27 MED ORDER — RP DX TC-99M TETROFOSMIN MCI
7.7 | Freq: Once | INTRAVENOUS | 0 refills | Status: CP
Start: 2021-06-27 — End: ?
  Administered 2021-06-27: 16:00:00 7.7 via INTRAVENOUS

## 2021-06-27 MED ORDER — SODIUM CHLORIDE 0.9 % IV SOLP
250 mL | INTRAVENOUS | 0 refills | Status: AC | PRN
Start: 2021-06-27 — End: ?

## 2021-06-27 MED ORDER — RP DX TC-99M TETROFOSMIN MCI
22.7 | Freq: Once | INTRAVENOUS | 0 refills | Status: CP
Start: 2021-06-27 — End: ?
  Administered 2021-06-27: 16:00:00 22.7 via INTRAVENOUS

## 2021-06-27 MED ORDER — AMINOPHYLLINE 25 MG/ML IV SOLN
50 mg | INTRAVENOUS | 0 refills | Status: AC | PRN
Start: 2021-06-27 — End: ?

## 2021-06-27 MED ORDER — ALBUTEROL SULFATE 90 MCG/ACTUATION IN HFAA
2 | RESPIRATORY_TRACT | 0 refills | Status: AC | PRN
Start: 2021-06-27 — End: ?

## 2021-06-27 MED ORDER — EUCALYPTUS-MENTHOL MM LOZG
1 | Freq: Once | ORAL | 0 refills | Status: AC | PRN
Start: 2021-06-27 — End: ?

## 2021-06-27 MED ORDER — REGADENOSON 0.4 MG/5 ML IV SYRG
.4 mg | Freq: Once | INTRAVENOUS | 0 refills | Status: CP
Start: 2021-06-27 — End: ?
  Administered 2021-06-27: 14:00:00 0.4 mg via INTRAVENOUS

## 2021-06-27 MED ORDER — NITROGLYCERIN 0.4 MG SL SUBL
.4 mg | SUBLINGUAL | 0 refills | Status: AC | PRN
Start: 2021-06-27 — End: ?

## 2021-06-29 ENCOUNTER — Encounter: Admit: 2021-06-29 | Discharge: 2021-06-29 | Payer: MEDICARE

## 2021-06-29 NOTE — Telephone Encounter
[facility], can you please let Fritz Pickerel know that his stress test looks OK. Some scattered, very mild abnormalities, but otherwise fine.    Results and recommendations called to patient's wife.

## 2021-06-29 NOTE — Telephone Encounter
Faxed OV confirmation and LOV noted to [facility]  Confirmation of successful fax received

## 2021-07-03 ENCOUNTER — Encounter: Admit: 2021-07-03 | Discharge: 2021-07-03 | Payer: MEDICARE

## 2021-07-17 ENCOUNTER — Encounter: Admit: 2021-07-17 | Discharge: 2021-07-17 | Payer: MEDICARE

## 2021-07-19 ENCOUNTER — Encounter: Admit: 2021-07-19 | Discharge: 2021-07-19 | Payer: MEDICARE

## 2021-08-11 ENCOUNTER — Encounter: Admit: 2021-08-11 | Discharge: 2021-08-11 | Payer: MEDICARE

## 2021-08-14 ENCOUNTER — Encounter: Admit: 2021-08-14 | Discharge: 2021-08-14 | Payer: MEDICARE

## 2021-08-14 MED ORDER — CARVEDILOL 25 MG PO TAB
25 mg | ORAL_TABLET | Freq: Two times a day (BID) | ORAL | 3 refills | 90.00000 days | Status: AC
Start: 2021-08-14 — End: ?

## 2021-08-14 MED ORDER — LOSARTAN 25 MG PO TAB
25 mg | ORAL_TABLET | Freq: Every day | ORAL | 3 refills | 90.00000 days | Status: AC
Start: 2021-08-14 — End: ?

## 2021-08-14 NOTE — Telephone Encounter
Patient's [name]  called to report low blood sugars  How are you checking your blood sugar ( fingerstick vs cgm- if CGM ask them to do fingerstick to compare) ? CGM/ Elenor Legato uses phone / does not verify with fingerstick   What is your current diabetic medication regimen?30 Tresiba daily, rarely takes Novolog, ozempic 1 mg every Friday   Do you think you accidentally doubled up on anything or took the wrong insulin at the wrong time?no   Have you been sick? No   Do you feel sick right now? no   How did you feel when you had the low blood sugars? No symptoms   Have you had any changes to your medications, diet, or activity level? no   What time of day are the low blood sugars are occurring? About every two hours starting around 130 am  Have you had more than one low blood sugar? How often is this happening? Almost every night for the last week and a half   How are you treating your low blood sugars? Peanut butter and orange juice       Patient and wife are not at the same location right now. Wife requests office call back at 330 pm to help her get his libre connected to the office

## 2021-08-14 NOTE — Telephone Encounter
Spoke to patient and his wife  Got them connected to clinic account for [place] [facility] so Estill Bamberg can look at it and advise

## 2021-08-14 NOTE — Telephone Encounter
Reubin Milan, APRN-NP  You 16 minutes ago (4:07 PM)       Could you let Fritz Pickerel and Vicente Males know he may lower his Tyler Aas to 28 units daily. Thank you!            Notified patient's wife

## 2021-08-18 ENCOUNTER — Encounter: Admit: 2021-08-18 | Discharge: 2021-08-18 | Payer: MEDICARE

## 2021-08-18 NOTE — Telephone Encounter
VM received from pt wife  States that pt used last Ozempic pen today  checking on status of next order    Messaged [facility]  States that they need most recent proof of income statement  They do have a pen that pt may have    Called pt wife back  Advised that POI needed  she would like to fax it  Repeated fax number back correctly  Will send to [facility] when received

## 2021-08-21 ENCOUNTER — Encounter: Admit: 2021-08-21 | Discharge: 2021-08-21 | Payer: MEDICARE

## 2021-08-21 DIAGNOSIS — I4891 Unspecified atrial fibrillation: Secondary | ICD-10-CM

## 2021-08-21 DIAGNOSIS — E7800 Pure hypercholesterolemia, unspecified: Secondary | ICD-10-CM

## 2021-08-21 DIAGNOSIS — I2510 Atherosclerotic heart disease of native coronary artery without angina pectoris: Secondary | ICD-10-CM

## 2021-08-21 DIAGNOSIS — G4730 Sleep apnea, unspecified: Secondary | ICD-10-CM

## 2021-08-21 DIAGNOSIS — E119 Type 2 diabetes mellitus without complications: Secondary | ICD-10-CM

## 2021-08-21 DIAGNOSIS — I10 Essential (primary) hypertension: Secondary | ICD-10-CM

## 2021-08-21 DIAGNOSIS — I495 Sick sinus syndrome: Secondary | ICD-10-CM

## 2021-08-23 ENCOUNTER — Ambulatory Visit: Admit: 2021-08-23 | Discharge: 2021-08-23 | Payer: MEDICARE

## 2021-08-23 ENCOUNTER — Encounter: Admit: 2021-08-23 | Discharge: 2021-08-23 | Payer: MEDICARE

## 2021-08-23 DIAGNOSIS — Z8249 Family history of ischemic heart disease and other diseases of the circulatory system: Secondary | ICD-10-CM

## 2021-08-23 DIAGNOSIS — Z950 Presence of cardiac pacemaker: Secondary | ICD-10-CM

## 2021-08-23 DIAGNOSIS — Q612 Polycystic kidney, adult type: Secondary | ICD-10-CM

## 2021-08-23 NOTE — Progress Notes
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   Order verified: yes   ?   Allergies reviewed: yes   ?   Patient history reviewed: yes   ?   Pt placed on MRI safe cardiac monitor, BP and O2 monitors.   ?   Pre procedure Vital Signs: HR 77, SaO2 96 , O2 Device RA, B/P 120/68   ?   MRI Device Settings placed by MRI Technologist Brooke with SmartSync    Program Mode: DOO    Pacing Rate: 85   ?   Conditional Pacemaker: Vital signs pre/post (see doc flowsheet for further)  HR 83, 120/74, SpO2 95 RA   ?   Device returned back to prior MRI device settings post by MRI technologist Jerene Pitch with SmartSync   ?   Post procedure Vital Signs: HR 72, SaO2 99, O2 Device RA, B/P 112/67   ?   ?

## 2021-08-24 ENCOUNTER — Encounter: Admit: 2021-08-24 | Discharge: 2021-08-24 | Payer: MEDICARE

## 2021-08-24 ENCOUNTER — Ambulatory Visit: Admit: 2021-08-24 | Discharge: 2021-08-25 | Payer: MEDICARE

## 2021-08-24 DIAGNOSIS — E119 Type 2 diabetes mellitus without complications: Secondary | ICD-10-CM

## 2021-08-24 DIAGNOSIS — I2510 Atherosclerotic heart disease of native coronary artery without angina pectoris: Secondary | ICD-10-CM

## 2021-08-24 DIAGNOSIS — E7800 Pure hypercholesterolemia, unspecified: Secondary | ICD-10-CM

## 2021-08-24 DIAGNOSIS — G4730 Sleep apnea, unspecified: Secondary | ICD-10-CM

## 2021-08-24 DIAGNOSIS — N3281 Overactive bladder: Secondary | ICD-10-CM

## 2021-08-24 DIAGNOSIS — I495 Sick sinus syndrome: Secondary | ICD-10-CM

## 2021-08-24 DIAGNOSIS — I10 Essential (primary) hypertension: Secondary | ICD-10-CM

## 2021-08-24 DIAGNOSIS — I4891 Unspecified atrial fibrillation: Secondary | ICD-10-CM

## 2021-08-24 MED ORDER — TROSPIUM 20 MG PO TAB
20 mg | ORAL_TABLET | Freq: Two times a day (BID) | ORAL | 1 refills | 30.00000 days | Status: AC
Start: 2021-08-24 — End: ?

## 2021-08-24 NOTE — Patient Instructions
What is Overactive Bladder (OAB)?  http://[email or web address].org/urologic-conditions/[email or web address]-bladder-([email or web address]      Overactive bladder (OAB) is the name for a group of urinary symptoms. It is not a disease. The most common symptom is a sudden, uncontrolled need or urge to urinate. Some people will leak urine when they feel this urge. Another symptom is the need to pass urine many times during the day and night. OAB is basically the feeling that you?ve ?gotta? go? to the bathroom urgently and too much.    Leaking urine is called incontinence?. Stress urinary incontinence (SUI), is another common bladder problem. It?s different from OAB. People with SUI leak urine while sneezing, laughing or doing other physical activities. More information on SUI can be found at ForexFest.no.    [employer]  As many as 30 percent of men and 40 percent of women in the [place] live with OAB symptoms. Many people living with OAB don't ask for help. They may feel embarrassed. Many people either don't know how to talk with their health care provider about their symptoms, or they think there aren't treatments that can help.    The truth is there are many treatments that can help. Asking your health care provider about it is the first step.    How OAB Can Affect Your Life  OAB can get in the way of your work, social life, exercise and sleep. Without treatment, OAB symptoms can make it hard to get through the day without many trips to the bathroom. You may not want to go out with friends or go far from home because you're afraid of being far from a bathroom. This makes many people feel lonely and isolated.    OAB may affect relationships with friends and family. It can disrupt your sleep and sex-life. Too little sleep will leave anyone tired and depressed. In addition, if you leak urine, you may develop skin problems or infections.    You don't have to let OAB rule your life. OAB can be controlled. If you think you have OAB, see your health care provider.    The Truth About OAB  OAB is not a normal part of getting older.  OAB is not just part of being a woman.  OAB is not just an issue with the prostate.  OAB is not caused by something you did.  Surgery is not the only treatment for OAB.  There are treatments to help people manage OAB symptoms.  There are treatments to help even minor OAB symptoms.    If you are bothered by OAB symptoms, then you should ask for treatment!    Symptoms  Urgency: The major symptom of OAB is a sudden, strong urge to urinate that you can't ignore. This gotta go feeling makes you fear you will leak if you don't get to a bathroom right away. You may or may not actually leak with this urge to go.    If you live with OAB, you may also:  Leak urine or have ?urge incontinence.? This means urine leaks when you feel the sudden urge to go. This isn?t the same as stress urinary incontinence or SUI . People with SUI leak urine when sneezing, laughing or doing other physical activities.    Urinate frequently. You may need to go to the bathroom many times during the day. The number of times someone urinates varies from person to person. Many experts agree that going to the bathroom more than eight times in 24 hours is ?frequent urination.?  Wake up at night to pass urine. If you have to wake from sleep to go to the bathroom more than once a night, it?s a symptom of OAB or nocturia.    Causes  How the Urinary Tract Works Normally, and What Causes OAB  The urinary tract   is the important system in our bodies that removes liquid waste (urine). It includes the organs that produce, store and pass urine. These are:    Kidneys: two bean-shaped organs that clean waste from the blood and make urine.    Ureters: two thin tubes that take urine from the kidney to the bladder.    Bladder: a balloon-like muscular sac that holds urine until it?s time to go to the bathroom.    Urethra: the tube that carries urine from the bladder out of the body. The urethra has a muscle called a sphincter that locks in urine.    The sphincter muscle opens to release urine when the bladder contracts.    Normally, when your bladder is full of urine waste, your brain signals the bladder. The bladder muscles then squeeze. This forces the urine out through the urethra. The sphincter in the urethra opens and urine flows out. When your bladder is not full, the bladder is relaxed.    With a healthy bladder, signals in your brain let you know that your bladder is getting full or is full, but you can wait to go to the bathroom. With OAB, you can?t wait. You feel a sudden, urgent need to go. This can happen even if your bladder isn?t full.    If the nerve signals between your bladder and brain don?t work properly, OAB can result. The signals might tell your bladder to empty, even when it isn't full. OAB can also be caused when muscles in your bladder are too active. This means that the bladder muscles contract to pass urine before your bladder is full. In turn, this causes a sudden, strong need to urinate. We call this urgency.        Risk Factors for OAB  Neurologic disorders or damage to the signals between your brain and bladder  Hormone changes  Pelvic muscle weakness or spasms  A urinary tract infection  Side effects from a medication  Diseases that affect the brain or spinal cord, like stroke and multiple sclerosis  If you think you have OAB, talk with your health care provider. It?s important to learn why it?s happening so you can manage your symptoms.    Diagnosis  After you talk about your symptoms, your health care provider may do an exam right away. Or, they may refer you to a specialist, such as a urologist who can diagnose and treat OAB. Some urologists specialize in incontinence and OAB.    Medical History  Your exam will begin with questions. Your provider will want to understand your health history and experiences. You should tell them about the symptoms you have, how long you?ve had them, and how they?re changing your life. A medical history will include questions about your past and current health problems. You should bring a list of over-the-counter and prescription drugs you take. You should also tell your provider about your diet and about how much and what kinds of liquids you drink during the day and night.    Physical Exam  Your provider will examine you to look for something that may be causing your symptoms. Doctors will often feel your abdomen, the organs in your  pelvis, and your rectum.    Bladder Diary  You may be asked to keep a Bladder Diaryfor a few weeks. With this, you will note how often you go to the bathroom and any time you leak urine. This will help your health care provider learn more about your day-to-day symptoms. The bladder diary helps you track:    When and how much fluid you drink  When and how much you urinate  How often you have that ?gotta go? urgency feeling  When and how much urine you may leak  Having a Bladder Diary during your first visit can be helpful because it describes your daily habits, your urinary symptoms, and shows your provider how they affect your life. Your doctor will use this information to help treat you.    Other Tests  Urine test: Your health care provider may ask you to leave a sample of your urine to test for infection or blood.  Bladder scan: This type of ultrasound shows how much urine is still in the bladder after you go to the bathroom.  More tests,like a cystoscopy or urodynamic testing, are usually not needed but may be used if your provider thinks something else is going on.    Treatment  There are a number of things you can do to manage OAB. Everyone has a different experience with what works best. Bonita Quin may try one treatment alone, or several at the same time.    You and your health care provider should talk about what you want from treatment and about each option. OAB treatments include:    Lifestyle Changes  Prescription Medications  Bladder Botox? (botulinum toxin) Treatments  Nerve Stimulation (peripheral and central)  Surgery    Lifestyle Changes  For OAB treatment, health care providers may first ask a patient to make lifestyle changes. These changes may also be called behavioral therapy. This could mean you eat different foods, change drinking habits, and pre-plan bathroom visits to feel better. Many people find these changes help.    Other people need to do more, such as:  1. Limit food and drinks that bother the bladder. There are certain foods and drinks known to irritate the bladder. You can start by avoiding diuretics - these drinks include caffeine and alcohol which encourage your body to make more urine. You can also try taking several foods out of your diet, and then add them back one at a time. This will show you which foods make your symptoms worse, so you can avoid them. You can add fiber to your diet to improve digestion. Oatmeal and whole grains are good. Fresh and dried fruit, vegetables, and beans may help. Many people feel better when they change the way they eat and drink.  Some foods and drinks that may affect your bladder:  Coffee/caffeine  Tea  Alcohol  Soda and other fizzy drinks  Some citrus fruits  Tomato-based foods  Chocolate (not white chocolate)  Some spicy foods  2. Keep a bladder diary. Writing down when you make trips to the bathroom for a few days can help you understand your body better. This diary may show you things that make symptoms worse. For example, are your symptoms worse after eating or drinking a certain kind of food? Are they worse when you don?t drink enough liquids?  3. Double voiding. This is when you empty your bladder twice. This may be helpful for people who have trouble fully emptying their bladder. After you go to the  bathroom, you wait a few seconds and then try again.  4. Delayed voiding. This is when you practice waiting before you go to the bathroom, even when you have to go. At first, you wait just a few minutes. Gradually, you may be able to wait two to three hours at a time. Only try this if your health care provider tells you to. Some people feel worse or have urine leaks when they wait too long to go to the bathroom.  5. Timed urination. This means you follow a daily bathroom schedule. Instead of going when you feel the urge, you go at set times during the day. You and your health care provider will create a reasonable schedule. You may try to go every two to four hours, whether you feel you have to or not. The goal is to prevent that urgent feeling and to regain control.  6. Exercises to relax your bladder muscle.  Kegel exercises: tightening and holding your pelvic muscles tight, to strengthen the pelvic floor.  Quick flicks are when you quickly squeeze and relax your pelvic floor muscles over and over again. So, when you feel the urge to go, a number of quick flicks may help control that ?gotta go? feeling. It helps to be still, relax and focus on just the exercise. Your health care provider or a physical therapist can help you learn these exercises.  Biofeedback may also help you learn about your bladder. Biofeedback uses computer graphs and sounds to monitor muscle movement. It can help teach you how your pelvic muscles move and how strong they are.    Prescription Drugs  When lifestyle changes aren?t enough, the next step may be to take medicine. Your health care provider can tell you about special drugs for OAB.    There are several drug types that can relax the bladder muscle. These drugs, like anti-muscarinics and beta-3 agonists, can help stop your bladder from squeezing when it?s not full. Some are taken as pills, by mouth. Others are gels or a sticky transdermal patch to give you the drug through your skin.    Anti-muscarinics and betta-3 adrenoceptor agonists can relax the bladder muscle and increase the amount of urine your bladder can hold and empty. Combination drugs, like using both anti-muscarinics and - betta-3 adrenoceptor agonists together may help control OAB when one option alone isn?t working.    Your health care provider will want to know if the medicine works for you. They will check to see if you get relief or if the drug causes problems, known as side effects. Some people get dry mouth and dry eyes, constipation, or blurred vision. If one drug you try doesn't work, your health care provider may ask you to take different amounts, give you a different one to try, or have you try two types together. Lifestyle changes and medicine at the same time help many people.    Bladder Botox Treatment  If lifestyle changes and medicine aren?t working, injections may be offered. A trained urologist for men and women, or a male pelvic medicine & reconstructive surgeon ([facility]) can help with this. They may offer Bladder Botox Treatment.    Botox works for the bladder by relaxing the muscle of the bladder wall to reduce urinary urgency and urge incontinence. It can help the bladder muscles from squeezing too much. To put botulinum toxin into the bladder, your doctor will use a cystoscope passed into the bladder so the doctor can see inside the bladder.  Then, the doctor will inject tiny amounts of botulinum toxin into the bladder muscle. This procedure is performed in the office with local anesthesia. The effects of Botox last up to six months. Repeat treatments will be necessary when OAB symptoms return.    Your health care provider will want to know if the botulinum toxin treatment works for you. They will check to see if you get relief, or if you aren?t holding in too much urine. If urine is not releasing well, you may need to use a catheter temporarily.    Nerve Stimulation  Another treatment for people who need extra help is nerve stimulation, also called neuromodulation therapy. This type of treatment sends electrical pulses to nerves that share the same path for the bladder. In OAB, the nerve signals between your bladder and brain do not communicate correctly. These electrical pulses help the brain and the nerves to the bladder communicate so the bladder can function properly and improve OAB symptoms.    There are two types:  Percutaneous tibial nerve stimulation (PTNS). PTNS (peripheral)is a way to correct the nerves in your bladder. PTNS is done by placing a small electrode in your lower leg near your ankle. It sends pulses to the tibial nerve. The tibial nerve runs along your knee to nerves in your lower back. The pulses help control the signals that aren?t working right. Often, patients receive 12 treatments, depending on how it?s working.        Sacral neuromodulation (SNS). SNS (central) changes how the sacral nerve works. This nerve carries signals between the spinal cord and the bladder. Its job is to help hold and release urine. In OAB, these nerve signals aren?t doing what they should. SNS uses a bladder pacemaker to control these signals to stop OAB symptoms. SNS is a two-step surgical process. The first step is to implant an electrical wire under the skin in your lower back. This wire is first connected to a handheld pacemaker to send pulses to the sacral nerve. You and your doctor will test whether or not this pacemaker can help you. If it helps, the second step is to implant a permanent pacemaker that can regulate the nerve rhythm.          Bladder Reconstruction/Urinary Diversion Surgery  Surgery is only used in very rare and serious cases. There are two types of surgery available. Augmentation cystoplasty enlarges the bladder. Urinary diversion re-routs the flow of urine. There are many risks to these surgeries, so it is offered only when no other option can help.    More Information  Providers and Specialists Who Treat OAB  Many types of health care providers can offer basic help for OAB. Here are the types of providers you may meet:    Urologist* are surgeons who evaluate and treat problems of the urinary tract. Most urologists are very experienced with incontinence. However, not all of them specialize in treating OAB. A patient should ask if their provider specializes in treating OAB.    Gynecologists are doctors who focus on women?s health. Most are knowledgeable about incontinence, but not all are trained to treat OAB.    Male Pelvic Medicine and Reconstructive Surgery [facility]) specialists are urologists or gynecologists who are trained as experts in male pelvic health. The public often refers to [facility] specialists as male urologists or urogynecologists.    Primary Care Practitioners are doctors who can diagnose and treat common health concerns. If a primary care provider is experienced with OAB,  they will tell you your options. Or, they may refer you to a specialist, especially if lifestyle changes haven?t helped.    Internist are general doctors who may or may not be primary care providers. They will often refer to a specialist.    Nurse Practitioners (NP) are highly trained nurses, able to treat many medical problems. Some NPs specialize in issues like OAB, or they will refer you to a specialist.    Physician Assistants (PA) are professionals licensed to practice medicine with a doctor?s oversight. NPs and PAs are often part of the health care team. Many can diagnose and treat non-surgically and can help with exercises and lifestyle changes. Some specialize in issues such as OAB.    Geriatricians are doctors who treat older patients, and many are able to evaluate and treat OAB. But, not all treat OAB.    Physical Therapists are licensed health professionals who provide physical therapy. If they have special training in pelvic floor disorders, they can help with exercises and lifestyle changes for OAB.    *Typically, specialists who treat OAB and incontinence include urologists and male pelvic medicine specialists. It helps to ask if your health care provider has direct training or experience with OAB.    Use our Find-a-Urologist tool to help find a urologist near you. Simply chose ?incontinence? as a specialty for urologists with training and experience in urine leaks and OAB.    Tips for a Successful Doctor's Visit  It?s normal to feel uncomfortable when talking about OAB symptoms. Who wants to talk about bathroom problems or incontinence? Still, knowing more about OAB is the best way to take control of the problem. A little planning will give you confidence. Here are some tips to help:    Be prepared. Before your appointment, help the health care provider learn what?s going on by gathering some information. Also, be ready to take notes about what you learn. It is helpful to bring:    A list of the prescription drugs, over-the-counter medicines, vitamins and herbs you take.  A list of your past and current illnesses or injuries.  Results from the Overactive Bladder Assessment Tool, to help you discuss your symptoms.  A way to take notes about treatments.  Bring a friend. Ask a close friend or relative to go with you to the doctor. An ?appointment buddy? can help remind you of things you may forget to ask, or remind you of things the health care provider said.    Bring up the topic. If your health care provider doesn?t ask about your OAB symptoms, then bring up the topic yourself. It may not be wise to wait until the end of your visit, so you can be sure you have time for questions. If a nurse meets with you first, tell the nurse about your symptoms.    Speak freely. Share everything you?re experiencing. Your health care provider has heard it all! It?s okay to tell them about your symptoms and how they impact your daily life.    Ask questions. A visit to your health care provider is the right time to ask questions. It is best to bring your list of questions with you so you don?t forget them. We offer some good questions to ask in each section of this guide to help you.    Talking with Your Health Care Provider  Questions to Ask the Doctor about OAB  Are my symptoms from OAB or from something else?  What tests will I   need to find out if I have OAB?  What could have caused my OAB?  Can I do anything to prevent OAB symptoms?    Questions to Ask the Doctor about Treatment  What would happen if I don?t treat my OAB?  What lifestyle changes should I make?  Are there any exercises I can do to help?  Do I need to see a physical therapist?  What treatment could help my OAB?  How soon after treatment will I feel better?  What are the good and bad things that I should know about these treatments?  What problems should I call you about after I start treatment?  What happens if the first treatment doesn?t help?  Will I need treatment for the rest of my life?  Can my OAB be managed?  What are my next steps?    Questions to Ask Yourself about Symptoms  Do my symptoms make me stop doing the things I enjoy, or prevent me from going to events?  Am I afraid to be too far from a bathroom?  Have my symptoms changed my relationships with friends or family?  Do my symptoms make it hard to get a good night?s sleep?

## 2021-08-25 DIAGNOSIS — R339 Retention of urine, unspecified: Secondary | ICD-10-CM

## 2021-08-26 ENCOUNTER — Encounter: Admit: 2021-08-26 | Discharge: 2021-08-26 | Payer: MEDICARE

## 2021-08-26 DIAGNOSIS — G4730 Sleep apnea, unspecified: Secondary | ICD-10-CM

## 2021-08-26 DIAGNOSIS — I10 Essential (primary) hypertension: Secondary | ICD-10-CM

## 2021-08-26 DIAGNOSIS — I2510 Atherosclerotic heart disease of native coronary artery without angina pectoris: Secondary | ICD-10-CM

## 2021-08-26 DIAGNOSIS — I4891 Unspecified atrial fibrillation: Secondary | ICD-10-CM

## 2021-08-26 DIAGNOSIS — I495 Sick sinus syndrome: Secondary | ICD-10-CM

## 2021-08-26 DIAGNOSIS — E119 Type 2 diabetes mellitus without complications: Secondary | ICD-10-CM

## 2021-08-26 DIAGNOSIS — E7800 Pure hypercholesterolemia, unspecified: Secondary | ICD-10-CM

## 2021-08-28 ENCOUNTER — Encounter: Admit: 2021-08-28 | Discharge: 2021-08-28 | Payer: MEDICARE

## 2021-08-28 MED ORDER — ATORVASTATIN 40 MG PO TAB
40 mg | ORAL_TABLET | Freq: Every day | ORAL | 3 refills | Status: AC
Start: 2021-08-28 — End: ?

## 2021-08-29 ENCOUNTER — Encounter: Admit: 2021-08-29 | Discharge: 2021-08-29 | Payer: MEDICARE

## 2021-09-04 ENCOUNTER — Encounter: Admit: 2021-09-04 | Discharge: 2021-09-04 | Payer: MEDICARE

## 2021-09-04 DIAGNOSIS — E1165 Type 2 diabetes mellitus with hyperglycemia: Secondary | ICD-10-CM

## 2021-09-04 MED ORDER — INSULIN DEGLUDEC 100 UNIT/ML (3 ML) SC INPN
32 [IU] | Freq: Every day | SUBCUTANEOUS | 1 refills | Status: CN
Start: 2021-09-04 — End: ?

## 2021-09-07 ENCOUNTER — Encounter: Admit: 2021-09-07 | Discharge: 2021-09-07 | Payer: MEDICARE

## 2021-09-14 ENCOUNTER — Encounter: Admit: 2021-09-14 | Discharge: 2021-09-14 | Payer: MEDICARE

## 2021-09-14 ENCOUNTER — Ambulatory Visit: Admit: 2021-09-14 | Discharge: 2021-09-15 | Payer: MEDICARE

## 2021-09-14 DIAGNOSIS — I495 Sick sinus syndrome: Secondary | ICD-10-CM

## 2021-09-14 DIAGNOSIS — E118 Type 2 diabetes mellitus with unspecified complications: Secondary | ICD-10-CM

## 2021-09-14 DIAGNOSIS — E7800 Pure hypercholesterolemia, unspecified: Secondary | ICD-10-CM

## 2021-09-14 DIAGNOSIS — I10 Essential (primary) hypertension: Secondary | ICD-10-CM

## 2021-09-14 DIAGNOSIS — I2510 Atherosclerotic heart disease of native coronary artery without angina pectoris: Secondary | ICD-10-CM

## 2021-09-14 DIAGNOSIS — I4891 Unspecified atrial fibrillation: Secondary | ICD-10-CM

## 2021-09-14 DIAGNOSIS — G4730 Sleep apnea, unspecified: Secondary | ICD-10-CM

## 2021-09-14 DIAGNOSIS — E119 Type 2 diabetes mellitus without complications: Secondary | ICD-10-CM

## 2021-09-14 DIAGNOSIS — E782 Mixed hyperlipidemia: Secondary | ICD-10-CM

## 2021-09-14 NOTE — Progress Notes
Date of Service: 09/14/2021    Subjective:         Paul Benjamin is a 76 y.o. male. He is here today with his wife.    Diabetes    The patient presents to the [facility] at [facility] for evaluation and management of diabetes mellitus. He was last seen by me in December 2022.    He is rarely using Novolog since our last visit.  He has established with podiatry and is seeing every 10 weeks for nail and foot care.       He continues to receive his Ozempic, tresiba and novolog through [facility] patient assistance.      In May 2021 Jardiance was DC'd due to renal decline. He has established with [facility].      Type 2 Diabetes mellitus  Dx: ~2010     A1c today in clinic: 6.2%  POC glucose: 153     Current DM regimen: Tresiba 28 units every morning and Ozempic 1 mg every Friday  Past treatment: Glimepiride- stopped during hospitalization, Jardiance- renal decline, Novolog  Adherence to medications: all the time  FSBG frequency: FreeStyle Libre 2  Hyperglycemia: rare  Hypoglycemia: tight overall glucose   Hypoglycemia unawareness?: no  Meals per day / Carb intake: 3 and occasional snack  Exercise: Home Health PT     Complications of DM:  ? CAD: YES, with ablation and stenting, and pacemaker, HF with preserved EF  ? CVA: No  ? PVD: No  ? Amputations: No  ? Retinopathy: No  ? Gastropathy: No  ? Nephropathy: YES  ? Neuropathy: No  ? Depression: No  ? Chronic wounds / delayed healing: YES, has had recent cellulitis--August 2020  ? DM related hospitalizations: No     Last dilated eye exam: December 2021  Last dental exam: upcoming visit August 2022  Last DM education / nutritionist visit:      DM related medications:  Statin: Yes, atorvastatin 40 mg  ACE-I: No  ASA: No     Continuous Glucose Monitoring Analysis and Interpretation    Indication for Device Placement: Large Fluctuations in daily pre-prandial blood sugar values    Name/Type of Device Placed: FreeStyle Libre    Date of Print out: 09/14/21    Continuous glucose monitor downloaded and personally reviewed by me on 09/14/21.    Analysis of Data    Date Range: June 9 - September 14, 2021    Sensor usage time (%): 93%    Average glucose (mg/dL): 161    Coefficient of Variation (%): 22.5%    Standard Deviation (mg/dL):      Time in range (%): 87%    Time above range (%): 13%   Time above 250mg /dL (%): 0%    Time below range (%): 0%   Time below 54mg /dL (%): 0%             Review of Systems    14 point review of systems was negative    Objective:         ? allopurinol (ZYLOPRIM) 100 mg tablet Take one tablet by mouth twice daily.   ? aspirin EC (ASPIR-LOW) 81 mg tablet Take one tablet by mouth daily. Take with food.   ? atorvastatin (LIPITOR) 40 mg tablet Take one tablet by mouth daily.   ? calcium carbonate (TUMS) 500 mg (200 mg elemental calcium) chewable tablet Chew one tablet by mouth as Needed. Indications: heartburn   ? carvediloL (COREG) 25  mg tablet Take one tablet by mouth twice daily with meals. Take with food.   ? CHOLEcalciferoL (vitamin D3) (OPTIMAL D3) 50,000 units capsule TAKE 1 CAPSULE BY MOUTH ONCE WEEKLY ON WEDNESDAYS   ? docusate (COLACE) 100 mg capsule Take one capsule by mouth daily.   ? ferrous sulfate (FEOSOL) 325 mg (65 mg iron) tablet Take one tablet by mouth twice daily.   ? FREESTYLE LITE STRIPS test strip Use one strip as directed before meals and at bedtime.   ? furosemide (LASIX) 80 mg tablet Take one tablet by mouth every morning. Indications: visible water retention   ? gabapentin (NEURONTIN) 100 mg capsule Take one capsule by mouth at bedtime daily.   ? insulin aspart (U-100) (NOVOLOG FLEXPEN U-100 INSULIN) 100 unit/mL (3 mL) PEN Inject 1 unit for every 50 > 150. Up to 30 units/day   ? insulin degludec (TRESIBA FLEXTOUCH U-100) 100 unit/mL (3 mL) subcutaneous PEN Inject thirty two Units under the skin daily. (Patient taking differently: Inject twenty eight Units under the skin daily.)   ? levothyroxine (SYNTHROID) 75 mcg tablet Take one tablet by mouth daily.   ? losartan (COZAAR) 25 mg tablet Take one tablet by mouth daily.   ? metOLazone (ZAROXOLYN) 2.5 mg tablet Take 1 tablet by mouth as directed.  Take once weekly or take additional dose if weight is about 248 pounds.   ? nitroglycerin (NITROSTAT) 0.4 mg tablet Place 1 tablet under tongue every 5 minutes as needed for Chest Pain.   ? pen needle, diabetic (RELION PEN NEEDLES) 32 gauge x 5/32 pen needle Use one each as directed before meals and at bedtime. Use with insulin injections.   ? potassium chloride (K-TAB) 20 mEq tablet Take one tablet by mouth daily. Take additional tablet when taking metalozone   ? pramipexole (MIRAPEX) 1.5 mg PO Tab Take one tablet by mouth at bedtime daily.   ? semaglutide (OZEMPIC) 1 mg/dose (4 mg/3 mL) injection PEN Inject 1 mg under the skin every 7 days.   ? tamsulosin (FLOMAX) 0.4 mg capsule Take two capsules by mouth at bedtime daily.   ? trospium (SANCTURA) 20 mg tablet Take one tablet by mouth twice daily before meals. Take with water on an empty stomach, at least 1 hour before a meal.     Vitals:    09/14/21 1338   BP: 128/75   BP Source: Arm, Left Upper   Pulse: 85   PainSc: Zero   Weight: 108.9 kg (240 lb)   Height: 147.3 cm (4' 10)     Body mass index is 50.16 kg/m?Marland Kitchen      Telehealth Patient Reported Vitals     Row Name 09/14/21 1338                Pain Score Zero        Patient Position Sitting        BP Source Arm, Left Upper                    Physical Exam  Constitutional:       Appearance: Normal appearance.   Pulmonary:      Effort: Pulmonary effort is normal.   Skin:     General: Skin is warm and dry.   Neurological:      Mental Status: He is alert and oriented to person, place, and time.   Psychiatric:         Mood and Affect: Mood normal.  Behavior: Behavior normal.         Thought Content: Thought content normal.       Diabetic Foot Exam       Bilateral vascular, sensation, integument are normal:  No    Vascular Status    Left:dorsalis pedis normal, Right:dorsalis pedis normal,   Monofilament Testing    Left:Diminished Right:Diminished   Sensation    Left:pinprick sensation diminished, Right:pinprick sensation diminished,   Skin Integrity    Left:Hypertrophic nails Right:Hypertrophic nails   Foot Structure    Left:Flat foot Right:Flat foot            Assessment and Plan:  Diabetes mellitus type 2, controlled   ? A1c today in clinic: 6.2%  ? Target A1c < 8% without hypoglycemia, with co-morbidities  ? Currently on Tresiba 28 units every morning and Ozempic 1 mg every Friday  ? Complicated by: Nephropathy and CAD     Plan:  ? Patient with tight glucose control, rarely needing novolog  ? DC Novolog-- I have updated our MAP team  ? Decrease Tresiba to 25 units, if he continues to have lows may decrease to 22 units  ? Continue Ozempic 1 mg every Friday--receives through patient assistance program  ? May reconsider SLGT2, update CMP in July 2023, if eGFR > 30 may start. Previously dc'd due to renal decline, now following with nephrology. Will need to get through MAP  ? Continue freestyle libre 2, receiving through diabetes supply center     Diabetic complication assessment:   ? Annual Dilated eye exam:   ? Annual labs (electrolytes and renal function): Reviewed, last done March 2023, following with nephrology  ? Annual Urine microalbumin/Cr: N/A CKD  ? Foot exam / monofilament exam (recommended annually): today, June 2023     Supportive care for diabetes:  ? Diabetic Educator (recommended annually): Consider in the future  ? Nutritionist:      Hypovitaminosis D  ? Currently taking Vitamin D3 50,000 units every 7 days  ? Vitamin D: 81 in September, 2022        Lipids: Hyperlipidemia    ? Recommendations for Statin treatment in diabetes:   ? 99-75 yo: No risk factors - moderate dose statin.  CVD risk factors or overt CVD - high dose statin.    ? CVD risk factors include LDL cholesterol ?100 mg/dL (2.6 mmol/L), high blood pressure, and overweight   ? Currently on a statin: atorvastatin 40 mg  ? Annual fasting lipid panel due:  March 2024     Blood pressure: Hypertension   ? Controlled   ? ACE-I or ARB: No        RTC 6 months with me in person at [facility]        Orders Placed This Encounter   ? GLUCOSE METER DOWNLOAD   ? POC HEMOGLOBIN A1C   ? POC GLUCOSE QUANTITATIVE BLOOD     Patient Instructions   It was nice to see you today!    Goals we discussed today:     Lower Tresiba to 25 units, this is your long acting insulin-- if you keep getting low sugars you may lower tresiba to 22 units     Continue Ozempidc 1 mg each week    Continue FreeStyle Libre 2       Please contact [facility] for any questions or abnormal glucose values (above 300 or less than 70 repeatedly).  [phone]   My nurse, Alexa, can be reached at  [phone]          Future Appointments   Date Time Provider Department Center   12/20/2021 10:20 AM Doran Durand, MD [facility] IM   12/21/2021  9:30 AM Vanice Sarah, MD [facility] CVM Exam   01/09/2022  2:15 PM Kovarik, Lenice Llamas, PA-C [facility]                              30 minutes spent on this patient's encounter with counseling and coordination of care taking >50% of the visit.

## 2021-09-15 DIAGNOSIS — N184 Chronic kidney disease, stage 4 (severe): Secondary | ICD-10-CM

## 2021-09-15 DIAGNOSIS — E1122 Type 2 diabetes mellitus with diabetic chronic kidney disease: Secondary | ICD-10-CM

## 2021-09-22 ENCOUNTER — Encounter: Admit: 2021-09-22 | Discharge: 2021-09-22 | Payer: MEDICARE

## 2021-10-02 ENCOUNTER — Encounter: Admit: 2021-10-02 | Discharge: 2021-10-02 | Payer: MEDICARE

## 2021-10-02 MED ORDER — FREESTYLE LIBRE 2 SENSOR MISC KIT
ORAL | 0 refills | 30.00000 days | Status: AC
Start: 2021-10-02 — End: ?

## 2021-10-02 NOTE — Telephone Encounter
VM received from pt wife  States that he had to take off one [place] early for an MRI  Another one malfunctioned  Requesting refill to [facility] in [place] per request

## 2021-10-05 ENCOUNTER — Encounter: Admit: 2021-10-05 | Discharge: 2021-10-05 | Payer: MEDICARE

## 2021-10-05 NOTE — Progress Notes
An application has been submitted to [employer] for Ozempic & Tresiba.      Paul Benjamin  Medication Assitance Coordinator  [phone]

## 2021-10-11 ENCOUNTER — Encounter: Admit: 2021-10-11 | Discharge: 2021-10-11 | Payer: MEDICARE

## 2021-10-11 NOTE — Telephone Encounter
Received fax from [place] asking for LOV date/ Next visit date and notes  Faxed to [phone]

## 2021-10-17 ENCOUNTER — Encounter: Admit: 2021-10-17 | Discharge: 2021-10-17 | Payer: MEDICARE

## 2021-10-17 NOTE — Progress Notes
MEDICATION ASSISTANCE PROGRAM (MAP)  MANUFACTURER SUPPLIED MEDICATION    Drug: Ozempic & Tyler Aas  Manufacturer Program: [employer]  Status: Approved  Enrollment Period: 10/16/2021-03/25/2022  MAP or Drug Company to Dispense: MAP    Notes: Patient eligibility is based off insurance status and/or dx and patient must continue to meet all criteria to remain in the program. Please notify the MAP Program of any changes.     Allayne Stack  Medication Assitance Coordinator  [phone]

## 2021-10-23 ENCOUNTER — Encounter: Admit: 2021-10-23 | Discharge: 2021-10-23 | Payer: MEDICARE

## 2021-10-27 ENCOUNTER — Encounter: Admit: 2021-10-27 | Discharge: 2021-10-27 | Payer: MEDICARE

## 2021-10-27 NOTE — Telephone Encounter
Fax received from [facility] and date of [place] and date per request

## 2021-11-09 ENCOUNTER — Encounter: Admit: 2021-11-09 | Discharge: 2021-11-09 | Payer: MEDICARE

## 2021-11-10 ENCOUNTER — Encounter: Admit: 2021-11-10 | Discharge: 2021-11-10 | Payer: MEDICARE

## 2021-12-14 ENCOUNTER — Encounter: Admit: 2021-12-14 | Discharge: 2021-12-14 | Payer: MEDICARE

## 2021-12-14 DIAGNOSIS — E1122 Type 2 diabetes mellitus with diabetic chronic kidney disease: Secondary | ICD-10-CM

## 2021-12-14 NOTE — Telephone Encounter
Spoke with pt spouse Vicente Males regarding below. Orders faxed to [facility] per request. Pt will complete on Tuesdya, 9/26.

## 2021-12-14 NOTE — Telephone Encounter
-----   Message from Julious Payer, MD sent at 12/14/2021 10:41 AM CDT -----  Regarding: Previsit labs  Hello  Jarrett Soho,      Please reach out to the patient to complete the requested labs prior to upcoming appointment next week.    Thank you  Julious Payer, MD

## 2021-12-18 ENCOUNTER — Encounter: Admit: 2021-12-18 | Discharge: 2021-12-18 | Payer: MEDICARE

## 2021-12-20 ENCOUNTER — Encounter: Admit: 2021-12-20 | Discharge: 2021-12-20 | Payer: MEDICARE

## 2021-12-20 ENCOUNTER — Ambulatory Visit: Admit: 2021-12-20 | Discharge: 2021-12-21 | Payer: MEDICARE

## 2021-12-20 DIAGNOSIS — E119 Type 2 diabetes mellitus without complications: Secondary | ICD-10-CM

## 2021-12-20 DIAGNOSIS — N1832 Stage 3b chronic kidney disease (HCC): Secondary | ICD-10-CM

## 2021-12-20 DIAGNOSIS — I2510 Atherosclerotic heart disease of native coronary artery without angina pectoris: Secondary | ICD-10-CM

## 2021-12-20 DIAGNOSIS — E7800 Pure hypercholesterolemia, unspecified: Secondary | ICD-10-CM

## 2021-12-20 DIAGNOSIS — I4891 Unspecified atrial fibrillation: Secondary | ICD-10-CM

## 2021-12-20 DIAGNOSIS — G4730 Sleep apnea, unspecified: Secondary | ICD-10-CM

## 2021-12-20 DIAGNOSIS — I10 Essential (primary) hypertension: Secondary | ICD-10-CM

## 2021-12-20 DIAGNOSIS — I495 Sick sinus syndrome: Secondary | ICD-10-CM

## 2021-12-20 MED ORDER — FUROSEMIDE 40 MG PO TAB
40 mg | ORAL_TABLET | Freq: Two times a day (BID) | ORAL | 3 refills | 90.00000 days | Status: AC
Start: 2021-12-20 — End: ?

## 2021-12-20 NOTE — Progress Notes
History from review of  prior records  and from  speaking to the patient .  History        CC: Evaluation management of CKD        HPI: Paul Benjamin is a 76 y.o. 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  He used to follow-up with Dr.Qasim and was last seen in March 2023.  Interval history: No major health events.  Home SBP around 120's.          Past Medical History   Medical History:   Diagnosis Date   ? Atrial fibrillation (HCC)    ? Coronary artery disease (CAD)     s/p PCI LAD   ? Hypercholesterolemia    ? Hypertension    ? Sleep apnea    ? 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 Type II diabetes mellitus (HCC)         Family History  Family History   Problem Relation Age of Onset   ? Asthma Father    ? Cardiovascular Father    ? Hypertension Mother         Social History  Social History     Socioeconomic History   ? Marital status: Married   Occupational History     Employer: [employer]     Comment: retired   Tobacco Use   ? Smoking status: Former     Packs/day: 1     Types: Cigarettes     Quit date: 03/26/1966     Years since quitting: 55.7   ? Smokeless tobacco: Never   Substance and Sexual Activity   ? Alcohol use: No     Comment: rarely   ? Drug use: No        Medication    HOME MEDS  ? allopurinol (ZYLOPRIM) 100 mg tablet Take one tablet by mouth twice daily.   ? aspirin EC (ASPIR-LOW) 81 mg tablet Take one tablet by mouth daily. Take with food.   ? atorvastatin (LIPITOR) 40 mg tablet Take one tablet by mouth daily.   ? calcium carbonate (TUMS) 500 mg (200 mg elemental calcium) chewable tablet Chew one tablet by mouth as Needed. Indications: heartburn   ? carvediloL (COREG) 25 mg tablet Take one tablet by mouth twice daily with meals. Take with food.   ? CHOLEcalciferoL (vitamin D3) (OPTIMAL D3) 50,000 units capsule TAKE 1 CAPSULE BY MOUTH ONCE WEEKLY ON WEDNESDAYS   ? docusate (COLACE) 100 mg capsule Take one capsule by mouth daily.   ? ferrous sulfate (FEOSOL) 325 mg (65 mg iron) tablet Take one tablet by mouth twice daily.   ? flash glucose sensor (FREESTYLE LIBRE 2 SENSOR) sensor Use as directed to continuously monitor glucose, changing every 14 days. Diagnosis Code: E11.65  Indications: type 2 diabetes mellitus   ? FREESTYLE LITE STRIPS test strip Use one strip as directed before meals and at bedtime.   ? furosemide (LASIX) 40 mg tablet Take one tablet by mouth twice daily.   ? gabapentin (NEURONTIN) 100 mg capsule Take one capsule by mouth at bedtime daily.   ? insulin degludec (TRESIBA FLEXTOUCH U-100) 100 unit/mL (3 mL) subcutaneous PEN Inject thirty two Units under the skin daily. (Patient taking differently: Inject twenty eight Units under the skin daily.)   ? levothyroxine (SYNTHROID) 75 mcg tablet Take one tablet by mouth daily.   ?  losartan (COZAAR) 25 mg tablet Take one tablet by mouth daily.   ? nitroglycerin (NITROSTAT) 0.4 mg tablet Place 1 tablet under tongue every 5 minutes as needed for Chest Pain.   ? pen needle, diabetic (RELION PEN NEEDLES) 32 gauge x 5/32 pen needle Use one each as directed before meals and at bedtime. Use with insulin injections.   ? potassium chloride (K-TAB) 20 mEq tablet Take one tablet by mouth daily. Take additional tablet when taking metalozone   ? pramipexole (MIRAPEX) 1.5 mg PO Tab Take one tablet by mouth at bedtime daily.   ? semaglutide (OZEMPIC) 1 mg/dose (4 mg/3 mL) injection PEN Inject 1 mg under the skin every 7 days.   ? tamsulosin (FLOMAX) 0.4 mg capsule Take two capsules by mouth at bedtime daily.   ? trospium (SANCTURA) 20 mg tablet Take one tablet by mouth twice daily before meals. Take with water on an empty stomach, at least 1 hour before a meal.          Review of Systems  Constitutional: negative  Eyes: negative  Ears, nose, mouth, throat, and face: negative  Respiratory: negative  Cardiovascular: negative  Gastrointestinal: negative  Genitourinary:negative  Integument/breast: negative  Hematologic/lymphatic: negative  Musculoskeletal:negative  Neurological: negative  Endocrine: negative      Physical Exam        Vitals:    12/20/21 1010 12/20/21 1012   BP: (!) 89/54 114/61   BP Source: Arm, Left Upper Arm, Left Upper   Pulse: 86 82   PainSc: Zero    Weight: 107.5 kg (237 lb)    Height: 177.8 cm (5' 10)      Body mass index is 34.01 kg/m?Marland Kitchen   Repeat BP 75/54mmHg,88/56mmHg (sitting, asymptomatic)    Standing : 96/79mmHg  HR:    Gen: Alert and Oriented, No Acute Distress   HEENT: Sclera normal; MMM  CV:  S1 and S2 normal, no rubs, murmurs or gallops   Pulm: Clear to Auscultation bilateral   GI: BS+ x4, non-tender to palpation  Neuro: Grossly normal, moving all extremities, speech intact  Ext: Has compression stockings on for edema  Skin: no rash     Assessment and Plan        Paul Benjamin is a 76 y.o. 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    sCR remains stable. We discussed measures to slow down progression     --Volume overload: Stop metolazone on account of hypotension  Optimize furosemide dosing to 40mg  po twice daily.  I discussed with him to take an additional dose if swelling increases      -ADPKD: The use of tolvaptan has not been studied in  this age group and at this eGFR.  He underwent MRA of the brain in May 2023 without evidence of cerebral aneurysm.    --Heart failure:  Diuretics as above  Continue losartan and carvedilol.  I have sent a message to his cardiologist (Dr Danella Maiers) about the medication changes and blood pressure holding parameters.      Doran Durand, MD      Patient Instructions   Stop metolazone  Change  furosemide  to 40mg  po twice daily  If swelling worsens , take an additional pill of furosemide.  Bring along your blood pressure measure to your next appointment.  The following are some measures which may help to slow down the progression of kidney disease:      Maintain adequate fluid intake.    Acute illnesses like the flu and COVID which may end up in hospitalization may impact on  kidney function and may cause the kidneys to fail.    It is important to keep a up with your regular vaccinations for these diseases and for the pneumonia vaccine as well.    If you develop diarrhea or vomiting and unable to keep up with your fluid intake, please report to the nearest ED since it may affect blood pressure and affect the blood flow to the kidney.    Avoid medications known as  NSAIDs which  include Advil, Motrin,Aleve, ibuprofen, naproxen.    Some medications used in patients with kidney disease including diuretics [water pill], some blood pressure medications [ACE inhibitors and ARB] like lisinopril, losartan may need to be held if you develop severe diarrhea or vomiting.  Under these conditions these medications may worsen kidney function and therefore need to be held until diarhea/vomiting resolves. If you have to hold your blood pressure or water pill medication for any of these reasons, please inform the provider who prescribed the medication.    Some medications used for stomach acid suppression  known as Proton Pump Inhibitors (which include pantoprazole, omeprazole, lansoprazole, nexium, protonix, prilosec) have been associated with kidney disease through unknown mechanisms. Unless you have a compelling indication for any of these medications , consider switching to a different class of medications known as histamine-2 receptor blockers eg. famotidine, ranitidine  etc (your pharmacist or PCP can recommend a brand).    Nutrition management is a critical component in slowing kidney disease progression.  Visit the following web site for information on diet in patients with kidney disease:  https://www.kidney.org/nutrition

## 2021-12-20 NOTE — Telephone Encounter
-----   Message from Michiel Cowboy, MD sent at 12/20/2021  1:00 PM CDT -----  Regarding: FW: Diuretic adjustment  Do you mind calling him to tell him I agree with the change in diuretic dosing described below?  Thanks.  ----- Message -----  From: Julious Payer, MD  Sent: 12/20/2021  12:41 PM CDT  To: Michiel Cowboy, MD  Subject: Diuretic adjustment                              Hello Dr Ricard Dillon,    I saw our mutual patient at the renal clinic today. He had hypotension. He was taking metolazone daily rather than prn. I discontinued the metolazone and changed furosemide dosing to twice daily with prn dose . I discussed with him holding parameters for losartan and carvedilol. He wants to discuss this with you as well before making any change.    Thank you    Julious Payer, MD

## 2021-12-20 NOTE — Telephone Encounter
Discussed with patient and spouse. They are agreeable to care plan and have no further questions at this time. They both confirmed D-T-L of appointment tomorrow.

## 2021-12-21 ENCOUNTER — Encounter: Admit: 2021-12-21 | Discharge: 2021-12-21 | Payer: MEDICARE

## 2021-12-21 DIAGNOSIS — I495 Sick sinus syndrome: Secondary | ICD-10-CM

## 2021-12-21 DIAGNOSIS — I2510 Atherosclerotic heart disease of native coronary artery without angina pectoris: Secondary | ICD-10-CM

## 2021-12-21 DIAGNOSIS — E7800 Pure hypercholesterolemia, unspecified: Secondary | ICD-10-CM

## 2021-12-21 DIAGNOSIS — G4730 Sleep apnea, unspecified: Secondary | ICD-10-CM

## 2021-12-21 DIAGNOSIS — I10 Essential (primary) hypertension: Secondary | ICD-10-CM

## 2021-12-21 DIAGNOSIS — I480 Paroxysmal atrial fibrillation: Secondary | ICD-10-CM

## 2021-12-21 DIAGNOSIS — I5032 Chronic diastolic (congestive) heart failure: Secondary | ICD-10-CM

## 2021-12-21 DIAGNOSIS — E1122 Type 2 diabetes mellitus with diabetic chronic kidney disease: Secondary | ICD-10-CM

## 2021-12-21 DIAGNOSIS — E119 Type 2 diabetes mellitus without complications: Secondary | ICD-10-CM

## 2021-12-21 DIAGNOSIS — E782 Mixed hyperlipidemia: Secondary | ICD-10-CM

## 2021-12-21 DIAGNOSIS — I4891 Unspecified atrial fibrillation: Secondary | ICD-10-CM

## 2021-12-21 NOTE — Progress Notes
Date of Service: 12/21/2021    Paul Benjamin is a 76 y.o. male.       HPI     Paul Benjamin was in the [place] clinic today, accompanied by his wife.  He is followed for coronary disease, atrial fibrillation, and diastolic heart failure.  He had previously been a patient of Dr. Pierre Bali and I saw him for the first time last September, 2022.  ?  He uses a walker and does not get out a lot in the winter months.  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  ?  His cardiac situation seems to be stable.  He had typical angina prior to his coronary stent procedure over 15 years ago.  He has had no recurrence of those symptoms and has had stress testing intermittently over the years.  Similarly, his diastolic heart failure appears to be pretty stable on the current medical program.  His exertional breathlessness has not worsened and his weight remains quite stable.    He was in Nephrology Clinic yesterday and hypotensive, it turns out, because he'd been taking metolazone daily instead of as needed.  This adjustment was made and his BP this morning is already better.  He was light headed yesterday when hypotensive.  ?  His atrial fibrillation was never symptomatic but he has a pacemaker and there is been no evidence of recurrent arrhythmia after his ablation procedure.  ?  He is not having any problems with TIA or stroke symptoms.  The decision had been made previously to discontinue oral anticoagulation after his successful ablation procedure and based on the lack of recurrence evidenced by device checks.  ?       Vitals:    12/21/21 0917 12/21/21 0932   BP: 102/64 104/65  Comment: Patient's home blood pressure monitor   BP Source: Arm, Left Upper Arm, Left Upper   Pulse: 80 75   SpO2: 96%    O2 Percent: 96 %    O2 Device: None (Room air)    PainSc: Eight    Weight: 109.1 kg (240 lb 9.6 oz)    Height: 177.8 cm (5' 10)      Body mass index is 34.52 kg/m?Paul Benjamin     Past Medical History  Patient Active Problem List    Diagnosis Date Noted   ? Overactive bladder (OAB)      Urinary frequency, urgency, nocturia, urge urinary incontinence (UUI).     ? Urinary frequency    ? Urinary urgency    ? Nocturia    ? Urinary incontinence, urge (UUI)    ? Venous stasis dermatitis of both lower extremities 05/23/2020   ? Obesity, morbid (HCC) 05/23/2020   ? Hypercholesterolemia 05/23/2020   ? Controlled type 2 diabetes mellitus with complication, with long-term current use of insulin (HCC) 04/28/2020   ? 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 Lower extremity edema 12/03/2019   ? Hypovitaminosis D 12/03/2019   ? 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 PLMD (periodic limb movement disorder) 09/29/2008   ? 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 SSS (sick sinus syndrome) (HCC)      09/30/06 Dual Chamber PPM Initial ImplantMedtronic generator model #ADDR01 Medtronic atrial lead model #4076, Medtronic RV lead model #4076,      ? A-fib (HCC) 09/30/2006  Brief episode of atrial fibrillation after the stenting.         a.  Recurrent bouts of atrial fibrillation but completely asymptomatic.  3/09:  Hospital admission for sotalol initiation and cardioversion  09/23/07 A fib ablation:  Pulmonary vein antral isolation, ligament of marshall ablation, posterior roof line, cavotricuspid isthmus ablation  6/09 event monitor:  No recurrent AF     ? 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  Implanted device is a MDT Adapta [id number]     ? CAD (coronary artery disease) 09/30/2006     01/21/04 Heart catheterization - Angioplasty and stenting of the anterior descending coronary artery on 01/21/04 with a Cypher drug-eluting stent used for the LAD. The right coronary and circumflex showed only minimal disease. The ejection fraction was 45-50%.   07/18/2004:  Thallium showing EF of 53% with minimal amount of ischemia involving the               inferolateral segment.  3/09 adenosine thallium stress:  This study suggests a low likelihood for ischemic/jeopardized myocardium. No definite fixed or reversible defects are evident. High risk scintigraphic features are absent. Although the calculated LV ejection fraction was reduced, by visual estimation, the EF would appear at the lower limits of normal. The left ventricle is within normal limits for size.     7/11 thallium:  SUMMARY/OPINION: This study is probably normal. There is a small defect involving the inferior wall. The majority of the defect is fixed while more distally there is a subtle degree of reversibility. Overall I think this probably represents diaphragmatic attenuation though a very slight degree of ischemia cannot be completely excluded. The left ventricular systolic function is normal with a calculated ejection fraction of 61 percent. The end diastolic volume is normal at 100 mL. The pulmonary to myocardial count ratio is normal at 0.36. There is no transient ischemic dilatation.   This study was compared to a myocardial perfusion study obtained in our office on May 26, 2007. The prior study demonstrated an EF of 43 percent though visually it was felt to be in more of the 50-55 percent range. The prior study also demonstrated a perfusion abnormality involving the inferior wall which had some mild reversibility but overall was felt to be due to diaphragmatic attenuation.   In comparing the two studies qualitatively, both demonstrated an inferior wall defect. The prior study had more reversibility involving the basal to mid section while the current study has some mild reversibility involving the distal section. Overall I think both studies probably represent diaphragmatic attenuation.     05/03/2015 - Stress Test:  ? This study is probably normal with inferior attenuation with a small sized mild intensity reversible defect in the basal inferior wall consistent with? diaphragmatic attenuation. Left ventricular systolic function is normal. There are no high risk prognostic indicators present.? The ECG portion of the study is negative for ischemia.         ? Hypertension 09/30/2006   ? Mixed dyslipidemia 09/30/2006   ? Sleep apnea 09/30/2006         Review of Systems   Constitutional: Positive for malaise/fatigue.   HENT: Negative.    Eyes: Negative.    Cardiovascular: Positive for dyspnea on exertion and leg swelling.   Respiratory: Negative.    Endocrine: Negative.    Hematologic/Lymphatic: Negative.    Skin: Negative.    Musculoskeletal: Positive for back pain, falls, joint pain, muscle weakness  and neck pain.   Gastrointestinal: Negative.    Genitourinary: Negative.    Neurological: Positive for excessive daytime sleepiness, numbness, paresthesias and weakness.   Psychiatric/Behavioral: Negative.    Allergic/Immunologic: Negative.        Physical Exam    Physical Exam   General Appearance: no distress, extremely kyphotic   Skin: warm, no ulcers or xanthomas   Digits and Nails: no cyanosis or clubbing   Eyes: conjunctivae and lids normal, pupils are equal and round   Teeth/Gums/Palate: dentition unremarkable, no lesions   Lips & Oral Mucosa: no pallor or cyanosis   Neck Veins: normal JVP , neck veins are not distended   Thyroid: no nodules, masses, tenderness or enlargement   Chest Inspection: chest is normal in appearance   Respiratory Effort: breathing comfortably, no respiratory distress   Auscultation/Percussion: lungs clear to auscultation, no rales or rhonchi, no wheezing   PMI: PMI not enlarged or displaced   Cardiac Rhythm: regular rhythm and normal rate   Cardiac Auscultation: S1, S2 normal, no rub, no gallop   Murmurs: no murmur   Peripheral Circulation: normal peripheral circulation   Carotid Arteries: normal carotid upstroke bilaterally, no bruits   Radial Arteries: normal symmetric radial pulses   Abdominal Aorta: no abdominal aortic bruit   Pedal Pulses: normal symmetric pedal pulses   Lower Extremity Edema: no lower extremity edema   Abdominal Exam: soft, non-tender, no masses, bowel sounds normal   Liver & Spleen: no organomegaly   Gait & Station: walks with a walker  Muscle Strength: normal muscle tone   Orientation: oriented to time, place and person   Affect & Mood: appropriate and sustained affect   Language and Memory: patient responsive and seems to comprehend information   Neurologic Exam: neurological assessment grossly intact   Other: moves all extremities      Cardiovascular Health Factors  Vitals BP Readings from Last 3 Encounters:   12/21/21 104/65   12/20/21 114/61   09/14/21 128/75     Wt Readings from Last 3 Encounters:   12/21/21 109.1 kg (240 lb 9.6 oz)   12/20/21 107.5 kg (237 lb)   09/14/21 108.9 kg (240 lb)     BMI Readings from Last 3 Encounters:   12/21/21 34.52 kg/m?   12/20/21 34.01 kg/m?   09/14/21 34.44 kg/m?      Smoking Social History     Tobacco Use   Smoking Status Former   ? Packs/day: 1   ? Types: Cigarettes   ? Quit date: 03/26/1966   ? Years since quitting: 55.7   Smokeless Tobacco Never      Lipid Profile Cholesterol   Date Value Ref Range Status   06/05/2021 145  Final     HDL   Date Value Ref Range Status   06/05/2021 35 (L) >40 Final     LDL   Date Value Ref Range Status   06/05/2021 75  Final     Triglycerides   Date Value Ref Range Status   06/05/2021 179 (H) <150 Final      Blood Sugar Hemoglobin A1C   Date Value Ref Range Status   10/17/2021 6.2  Final     Glucose   Date Value Ref Range Status   11/02/2021 110 (H) 70 - 105 Final   07/18/2021 114  Final   06/05/2021 90  Final   01/06/2016 113 (H) 70 - 100 mg/dL Final   56/21/3086 578 (H) 70 - 110 MG/DL Final  01/21/2004 122 (H) 70 - 110 MG/DL Final     Glucose, POC   Date Value Ref Range Status   09/14/2021 153  Final   11/10/2020 104  Final   07/28/2020 212  Final          Problems Addressed Today  No diagnosis found.    Assessment and Plan     CAD (coronary artery disease)  He presented with typical exertional angina leading to unstable angina.  He's had no further angina symptoms since his PCI in 2005.  His last surveillance stress test was a regadenson thallium at [place] back in April, 2023.  It showed some very minor abnormalities, but basically looked fine for Paul Benjamin.  ?  A-fib (HCC)  His AF was asymptomatic, but he hasn't had any detected on his PPM checks since his ablation.  Previously decision made to DC anti-coagulation due to fall risk.?  ?  Hypertension  Home BP averages 130's/70's.  ?  Chronic heart failure with preserved ejection fraction (HFpEF) (HCC)  Weight stable, no increase in exertional dyspnea.  ?           Current Medications (including today's revisions)  ? allopurinol (ZYLOPRIM) 100 mg tablet Take one tablet by mouth twice daily.   ? aspirin EC (ASPIR-LOW) 81 mg tablet Take one tablet by mouth daily. Take with food.   ? atorvastatin (LIPITOR) 40 mg tablet Take one tablet by mouth daily.   ? calcium carbonate (TUMS) 500 mg (200 mg elemental calcium) chewable tablet Chew one tablet by mouth as Needed. Indications: heartburn   ? carvediloL (COREG) 25 mg tablet Take one tablet by mouth twice daily with meals. Take with food.   ? CHOLEcalciferoL (vitamin D3) (OPTIMAL D3) 50,000 units capsule TAKE 1 CAPSULE BY MOUTH ONCE WEEKLY ON WEDNESDAYS   ? docusate (COLACE) 100 mg capsule Take one capsule by mouth daily.   ? ferrous sulfate (FEOSOL) 325 mg (65 mg iron) tablet Take one tablet by mouth twice daily.   ? flash glucose sensor (FREESTYLE LIBRE 2 SENSOR) sensor Use as directed to continuously monitor glucose, changing every 14 days. Diagnosis Code: E11.65  Indications: type 2 diabetes mellitus   ? FREESTYLE LITE STRIPS test strip Use one strip as directed before meals and at bedtime.   ? furosemide (LASIX) 40 mg tablet Take one tablet by mouth twice daily.   ? gabapentin (NEURONTIN) 100 mg capsule Take one capsule by mouth at bedtime daily.   ? insulin degludec (TRESIBA FLEXTOUCH U-100) 100 unit/mL (3 mL) subcutaneous PEN Inject thirty two Units under the skin daily. (Patient taking differently: Inject twenty five Units under the skin daily.)   ? levothyroxine (SYNTHROID) 75 mcg tablet Take one tablet by mouth daily.   ? losartan (COZAAR) 25 mg tablet Take one tablet by mouth daily.   ? nitroglycerin (NITROSTAT) 0.4 mg tablet Place 1 tablet under tongue every 5 minutes as needed for Chest Pain.   ? pen needle, diabetic (RELION PEN NEEDLES) 32 gauge x 5/32 pen needle Use one each as directed before meals and at bedtime. Use with insulin injections.   ? potassium chloride (K-TAB) 20 mEq tablet Take one tablet by mouth daily. Take additional tablet when taking metalozone   ? pramipexole (MIRAPEX) 1.5 mg PO Tab Take one tablet by mouth at bedtime daily.   ? semaglutide (OZEMPIC) 1 mg/dose (4 mg/3 mL) injection PEN Inject 1 mg under the skin every 7 days.   ? tamsulosin (FLOMAX) 0.4 mg  capsule Take two capsules by mouth at bedtime daily.   ? traMADoL (ULTRAM) 50 mg tablet Take one tablet by mouth as Needed.   ? trospium (SANCTURA) 20 mg tablet Take one tablet by mouth twice daily before meals. Take with water on an empty stomach, at least 1 hour before a meal.     Total time spent on today's office visit was 30 minutes.  This includes face-to-face in person visit with patient as well as nonface-to-face time including review of the EMR, outside records, labs, radiologic studies, echocardiogram & other cardiovascular studies, formation of treatment plan, after visit summary, future disposition, and lastly on documentation.

## 2021-12-21 NOTE — Assessment & Plan Note
Lab Results   Component Value Date    CHOL 145 06/05/2021    TRIG 179 (H) 06/05/2021    HDL 35 (L) 06/05/2021    LDL 75 06/05/2021    VLDL 36 06/05/2021    NONHDLCHOL 94 12/03/2019    CHOLHDLC 4 06/05/2021      LDL close to goal.

## 2021-12-26 ENCOUNTER — Encounter: Admit: 2021-12-26 | Discharge: 2021-12-26 | Payer: MEDICARE

## 2021-12-26 DIAGNOSIS — E1122 Type 2 diabetes mellitus with diabetic chronic kidney disease: Secondary | ICD-10-CM

## 2021-12-26 NOTE — Progress Notes
Cardiovascular Medicine    STAFF CARDIOLOGY CONSULTATION NOTE  Admission Date: (Not on file)  Date of Consultation:  12/26/2021  @KULOS @  Requesting Physician: No admitting provider for patient encounter.  Consulting Physician: Latricia Heft  Code Status: @KURRCODESTATUS @    Reason for Consultation  Opinion and recommendations regarding symptomatic worsening of his edema with low blood pressure and kidney disease      History of Present Illness  This is a 77 y.o. male patient past medical history of heart failure preserved ejection fraction with chronic lower extremity edema with skin changes, chronic renal insufficiency with baseline creatinine approximately 2, history of paroxysmal atrial fibrillation with previous ablation and now has dual-chamber pacemaker in place, hypertension, GU issues with urgency and frequency issues, type 2 diabetes mellitus who was seen by his primary physician yesterday and noted to have lower blood pressures and concerns for worsening swelling and developing of blisters and open drainage at his lower legs.  He was admitted directly to the local hospital for further evaluation and treatment.  Patient notes he has been feeling slightly lightheaded but no syncope or near syncopal type episodes.  States he is otherwise been compliant tolerant with his medications.  He is not having fevers or chills.  No recent symptoms or findings for his atrial fibrillation.  Reports typically when they do notify him that his pacemaker reveals he had atrial fibrillation he really is not symptomatic.  Recent pacemaker check in late August did not show significant atrial fibrillation and he is not pacemaker dependent.  Denies significant orthopnea or PND type symptoms.  No change in bowel habits.  No findings for bleeding or bruising.  At admission was noted to have increased creatinine to 2.5.  They held his losartan and carvedilol.  Did change his antibiotic therapy for concerns for infectious cellulitis of the legs.  Patient reports she otherwise is feeling mostly like himself today and is not concerned about lightheadedness or shortness of breath.  Notes legs are minimally improved from when he initially presented and that there always swollen and discolored.  Struggles at times using compression at home because of the ability to get the socks and garments on and off.    All pertinent positive and negative review of systems are outlined in the HPI and/or documented in the review of systems section.      Past Medical History  Medical History:   Diagnosis Date   ? Atrial fibrillation (HCC)    ? CAD (coronary artery disease) 09/30/2006    01/21/04 Heart catheterization - Angioplasty and stenting of the anterior descending coronary artery on 01/21/04 with a Cypher drug-eluting stent used for the LAD. The right coronary and circumflex showed only minimal disease. The ejection fraction was 45-50%.  07/18/2004:  Thallium showing EF of 53% with minimal amount of ischemia involving the              inferolateral segment. 3/09 adenosine t   ? Coronary artery disease (CAD)     s/p PCI LAD   ? Hypercholesterolemia    ? Hypertension    ? Sleep apnea    ? 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 Type II diabetes mellitus (HCC)          Social History  Social History     Socioeconomic History   ? Marital status: Married   Occupational History     Employer: Location manager  Comment: retired   Tobacco Use   ? Smoking status: Former     Packs/day: 1     Types: Cigarettes     Quit date: 03/26/1966     Years since quitting: 55.7   ? Smokeless tobacco: Never   Substance and Sexual Activity   ? Alcohol use: No     Comment: rarely   ? Drug use: No         Family History  Family History   Problem Relation Age of Onset   ? Asthma Father    ? Cardiovascular Father    ? Hypertension Mother        Current Medications  @MEDSSCHEDULED @  @MEDSPRN @    Home Medications  @KUIPPTAMEDS @    Allergies  Allergies Allergen Reactions   ? Ciprofloxacin RASH   ? Lisinopril SEE COMMENTS     hyperkalemia   ? Cefuroxime HIVES   ? Clindamycin RASH   ? Plavix [Clopidogrel] RASH   ? Sulfa (Sulfonamide Antibiotics) HIVES   ? Quinine SEE COMMENTS     Allergy recorded in SMS: QUININE       I reviewed and confirmed this patient's problem list, active medications, allergies, and past medical, social, family & tobacco histories.     Review of Systems  General: See HPI  Eyes:  negative/normal.  Ears/Nose/Throat:  negative/normal.  Cardiovascular: See HPI  Respiratory: No cough or wheeze  Gastrointestinal:  negative/normal.  Genitourinary: See HPI  Musculoskeletal:  negative/normal.  Skin: See HPI  Neurologic:  negative/normal.  Psychiatric:  negative/normal.  Endocrine:  negative/normal.  Heme/Lymphatic: negative/normal.  Allergic/Immunologic:  negative/normal.                             Vital Signs: Most Recent                 Vital Signs: 24 Hour Range   @KURRVITALS1 @ @KURRVITALS3 @   Labs  Sodium 147, potassium 3.6, chloride 107, CO2 25, BUN 101, creatinine 2.59, glucose 131, calcium 9, bilirubin 0.4, no significant AST, ALP, or alk phos abnormalities, CRP 3.5, total protein of 6, albumin 3.7.  Space-based white blood cell count 4.7, hemoglobin 9.3, platelet count 137, ESR 29.  Repeat lab from 10 3 shows sodium 146, potassium 3.2, chloride 110, CO2 25, BUN 99, creatinine 2.32, glucose 126, calcium 9, CRP 2.5     Vital signs: Heart rate 85, temperature 97.5, blood pressure ranging 100/58 weight 106.8 kg at admission       There is no height or weight on file to calculate BMI.    Physical Exam      General Appearance: no acute distress.  Pronounced forward angulation of the neck or down toward head.  No other acute distress  HEENT: EOMI, mucous membranes moist, oropharynx is clear  Neck Veins: neck veins are flat & not distended  Carotid Arteries: no bruits  Chest Inspection: chest is normal in appearance  Auscultation/Percussion: lungs clear to auscultation, no rales, rhonchi, or wheezing  Cardiac Rhythm: regular rhythm & normal rate  Cardiac Auscultation: Normal S1 & S2, no S3 or S4, no rub  Murmurs: no cardiac murmurs  Abdominal Exam: Protuberant soft, non-tender, normal bowel sounds, no masses or bruits  Abdominal aorta: nonpalpable   Liver & Spleen: no organomegaly  Extremities: Thickened skin with purplish discoloration bilaterally.  Superficial bruising of the skin at the medial ankle of the left lower leg.  4+ pitting edema.  Does go up to the knees and slightly into the lower thigh  Neurologic Exam: oriented to time, place and person; no focal neurologic deficits      Assessment & Plan   Paul Benjamin is a 76 y.o. patient with the following problems:    Acute on chronic venous stasis dermatitis  Sick sinus syndrome with pacemaker  Heart failure preserved ejection fraction  Acute on chronic renal insufficiency    Patient's heart rhythm is clinically stable.  This is by exam, symptoms, and hemodynamics.  Blood pressure is decreased but not to point when I expect significant hypoperfusion.  With his rhythm being stable especially after his ablation in the limited symptoms and otherwise preserved left ventricular ejection fraction advise decreasing and potentially moving away from the beta-blocker.  At this time could resume but at much lower dose of 12.5 mg p.o. twice daily.  With the acute renal insufficiency continue holding losartan at this time.  Patient does have findings for significant third spacing predominantly in his lower extremities.  I do not see any symptoms or findings for more central congestion.  Need to correct the hypokalemia.  At this time we will continue to avoid the metolazone.  We will challenge with IV Bumex at 2 mg x 1 and reassess on October 4.  Moving forward when his renal function is fully stabilized which appears like it is moving that direction would change from his furosemide to Bumex at 2 mg p.o. daily. Stay away from metolazone currently.  Stop potassium and start him on low-dose spironolactone at 12.5 mg p.o. daily and monitor.  Need to use compression needed by wraps or stockings to the knee level more consistently.  May need wound care to help with maneuvering and activation of the chronic fluid within his lower legs.  Would advise having cardiology reassess if he continues to be in the inpatient setting within the next 4 to 6 days.  Follow-up from heart standpoint as an outpatient should be in approximate 1 month.    Staff: Latricia Heft, MD, [facility] date: 12/26/21

## 2021-12-26 NOTE — Telephone Encounter
Rec'd VM from pt spouse Vicente Males. They had some questions about two of his medications and how pt is supposed to take it. He is currently in the hospital right now so needing to know.  Returned Anna's call. She is needing to know how pt is supposed to take the trospium 20mg . Advised pt to take one tablet PO BID before meals.  She also wanted to confirm how pt is to take flomax 0.4mg  - has been giving 2 capsules PO at bedtime. Confirmed with Vicente Males that that is correct per pt med list on file.  He is currently hospitalized at [facility] in [place] d/t low BP (was 80s/50s at doctors office) and edema. Advised will let Dr. Kirby Funk know. Vicente Males verbalized understanding and appreciation. Routed to Dr. Kirby Funk as Juluis Rainier.

## 2022-01-09 ENCOUNTER — Encounter: Admit: 2022-01-09 | Discharge: 2022-01-09 | Payer: MEDICARE

## 2022-01-09 ENCOUNTER — Ambulatory Visit: Admit: 2022-01-09 | Discharge: 2022-01-10 | Payer: MEDICARE

## 2022-01-09 DIAGNOSIS — N289 Disorder of kidney and ureter, unspecified: Secondary | ICD-10-CM

## 2022-01-09 DIAGNOSIS — E119 Type 2 diabetes mellitus without complications: Secondary | ICD-10-CM

## 2022-01-09 DIAGNOSIS — I10 Essential (primary) hypertension: Secondary | ICD-10-CM

## 2022-01-09 DIAGNOSIS — E7800 Pure hypercholesterolemia, unspecified: Secondary | ICD-10-CM

## 2022-01-09 DIAGNOSIS — I2510 Atherosclerotic heart disease of native coronary artery without angina pectoris: Secondary | ICD-10-CM

## 2022-01-09 DIAGNOSIS — R300 Dysuria: Secondary | ICD-10-CM

## 2022-01-09 DIAGNOSIS — I4891 Unspecified atrial fibrillation: Secondary | ICD-10-CM

## 2022-01-09 DIAGNOSIS — G4730 Sleep apnea, unspecified: Secondary | ICD-10-CM

## 2022-01-09 DIAGNOSIS — I495 Sick sinus syndrome: Secondary | ICD-10-CM

## 2022-01-09 DIAGNOSIS — N3941 Urge incontinence: Secondary | ICD-10-CM

## 2022-01-09 MED ORDER — MIRABEGRON 50 MG PO TB24
50 mg | ORAL_TABLET | Freq: Every day | ORAL | 1 refills | Status: AC
Start: 2022-01-09 — End: ?

## 2022-01-10 DIAGNOSIS — N3281 Overactive bladder: Secondary | ICD-10-CM

## 2022-01-10 DIAGNOSIS — R350 Frequency of micturition: Secondary | ICD-10-CM

## 2022-01-10 DIAGNOSIS — R351 Nocturia: Secondary | ICD-10-CM

## 2022-01-10 DIAGNOSIS — R3915 Urgency of urination: Secondary | ICD-10-CM

## 2022-01-12 ENCOUNTER — Encounter: Admit: 2022-01-12 | Discharge: 2022-01-12 | Payer: MEDICARE

## 2022-01-12 DIAGNOSIS — I495 Sick sinus syndrome: Secondary | ICD-10-CM

## 2022-01-12 DIAGNOSIS — I2510 Atherosclerotic heart disease of native coronary artery without angina pectoris: Secondary | ICD-10-CM

## 2022-01-12 DIAGNOSIS — E119 Type 2 diabetes mellitus without complications: Secondary | ICD-10-CM

## 2022-01-12 DIAGNOSIS — G4730 Sleep apnea, unspecified: Secondary | ICD-10-CM

## 2022-01-12 DIAGNOSIS — E7800 Pure hypercholesterolemia, unspecified: Secondary | ICD-10-CM

## 2022-01-12 DIAGNOSIS — I10 Essential (primary) hypertension: Secondary | ICD-10-CM

## 2022-01-12 DIAGNOSIS — N289 Disorder of kidney and ureter, unspecified: Secondary | ICD-10-CM

## 2022-01-12 DIAGNOSIS — I4891 Unspecified atrial fibrillation: Secondary | ICD-10-CM

## 2022-01-12 NOTE — Telephone Encounter
Patient's spouse called to notify that Myrbetriq Rx is too expensive even with insurance. Advised that they contact their insurance company to obtain a list of OAB medications that they cover. Will then review with Clair Gulling to see what his recommendations are. They are agreeable to plan and appreciated the call back.

## 2022-01-18 ENCOUNTER — Encounter: Admit: 2022-01-18 | Discharge: 2022-01-18 | Payer: MEDICARE

## 2022-01-18 NOTE — Progress Notes
Re-enrollment application for patient's Ozempic & Tresiba has been submitted to prescriber for signatures.    Paul Benjamin  Medication Assitance Coordinator  [phone]

## 2022-01-18 NOTE — Telephone Encounter
Received urine culture results from [facility] from 01/10/22, which were negative for UTI. Reviewed results with patient's wife. No further questions at this time.

## 2022-01-19 ENCOUNTER — Encounter: Admit: 2022-01-19 | Discharge: 2022-01-19 | Payer: MEDICARE

## 2022-01-19 DIAGNOSIS — R300 Dysuria: Secondary | ICD-10-CM

## 2022-01-22 ENCOUNTER — Encounter: Admit: 2022-01-22 | Discharge: 2022-01-22 | Payer: MEDICARE

## 2022-01-22 DIAGNOSIS — E1165 Type 2 diabetes mellitus with hyperglycemia: Secondary | ICD-10-CM

## 2022-01-22 MED ORDER — OZEMPIC 1 MG/DOSE (4 MG/3 ML) SC PNIJ
1 mg | SUBCUTANEOUS | 2 refills | Status: CN
Start: 2022-01-22 — End: ?

## 2022-02-02 ENCOUNTER — Encounter: Admit: 2022-02-02 | Discharge: 2022-02-02 | Payer: MEDICARE

## 2022-02-02 NOTE — Progress Notes
Re-enrollment application for patient's Ozempic & Tresiba has been submitted to prescriber for signatures for second attempt.    Paul Benjamin  Medication Assitance Coordinator  [phone]

## 2022-02-05 ENCOUNTER — Encounter: Admit: 2022-02-05 | Discharge: 2022-02-05 | Payer: MEDICARE

## 2022-02-05 NOTE — Telephone Encounter
Faxed signed  form to [employer]  Confirmation of original fax received  Original placed to scan

## 2022-02-07 ENCOUNTER — Encounter: Admit: 2022-02-07 | Discharge: 2022-02-07 | Payer: MEDICARE

## 2022-02-07 NOTE — Progress Notes
A re-enrollment application has been submitted to [employer] for Ozempic & Tresiba.      Cayne Yom  Medication Assitance Coordinator  [phone]

## 2022-02-09 ENCOUNTER — Encounter: Admit: 2022-02-09 | Discharge: 2022-02-09 | Payer: MEDICARE

## 2022-03-12 ENCOUNTER — Encounter: Admit: 2022-03-12 | Discharge: 2022-03-12 | Payer: MEDICARE

## 2022-03-12 MED ORDER — ATORVASTATIN 40 MG PO TAB
40 mg | ORAL_TABLET | Freq: Every day | ORAL | 3 refills | Status: AC
Start: 2022-03-12 — End: ?

## 2022-03-16 ENCOUNTER — Encounter: Admit: 2022-03-16 | Discharge: 2022-03-16 | Payer: MEDICARE

## 2022-04-02 ENCOUNTER — Encounter: Admit: 2022-04-02 | Discharge: 2022-04-02 | Payer: MEDICARE

## 2022-04-03 ENCOUNTER — Encounter: Admit: 2022-04-03 | Discharge: 2022-04-03 | Payer: MEDICARE

## 2022-04-03 NOTE — Progress Notes
MEDICATION ASSISTANCE PROGRAM (MAP)  MANUFACTURER SUPPLIED MEDICATION    Drug: Ozempic & Tyler Aas  Manufacturer Program: [employer]  Status: Approved  Enrollment Period: 04/02/2022-03/26/2023  MAP or Drug Company to Dispense: MAP    Notes: Patient eligibility is based off insurance status and/or dx and patient must continue to meet all criteria to remain in the program. Please notify the MAP Program of any changes.     Allayne Stack  Medication Assistance Coordinator  [phone]

## 2022-04-05 ENCOUNTER — Encounter: Admit: 2022-04-05 | Discharge: 2022-04-05 | Payer: MEDICARE

## 2022-04-05 ENCOUNTER — Ambulatory Visit: Admit: 2022-04-05 | Discharge: 2022-04-06 | Payer: MEDICARE

## 2022-04-05 DIAGNOSIS — N289 Disorder of kidney and ureter, unspecified: Secondary | ICD-10-CM

## 2022-04-05 DIAGNOSIS — I495 Sick sinus syndrome: Secondary | ICD-10-CM

## 2022-04-05 DIAGNOSIS — I2510 Atherosclerotic heart disease of native coronary artery without angina pectoris: Secondary | ICD-10-CM

## 2022-04-05 DIAGNOSIS — E119 Type 2 diabetes mellitus without complications: Secondary | ICD-10-CM

## 2022-04-05 DIAGNOSIS — G4730 Sleep apnea, unspecified: Secondary | ICD-10-CM

## 2022-04-05 DIAGNOSIS — E7800 Pure hypercholesterolemia, unspecified: Secondary | ICD-10-CM

## 2022-04-05 DIAGNOSIS — E118 Type 2 diabetes mellitus with unspecified complications: Secondary | ICD-10-CM

## 2022-04-05 DIAGNOSIS — I10 Essential (primary) hypertension: Secondary | ICD-10-CM

## 2022-04-05 DIAGNOSIS — I4891 Unspecified atrial fibrillation: Secondary | ICD-10-CM

## 2022-04-05 MED ORDER — JARDIANCE 10 MG PO TAB
10 mg | ORAL_TABLET | Freq: Every day | ORAL | 3 refills | Status: AC
Start: 2022-04-05 — End: ?

## 2022-04-06 ENCOUNTER — Encounter: Admit: 2022-04-06 | Discharge: 2022-04-06 | Payer: MEDICARE

## 2022-04-06 DIAGNOSIS — Z794 Long term (current) use of insulin: Secondary | ICD-10-CM

## 2022-04-10 ENCOUNTER — Encounter: Admit: 2022-04-10 | Discharge: 2022-04-10 | Payer: MEDICARE

## 2022-04-10 NOTE — Telephone Encounter
Received fax from [facility] in the Retail Medication Assistance Department   Requesting signature on [place] form to [employer] for [employer]

## 2022-04-10 NOTE — Progress Notes
Application for patient's Jardiance has been submitted to prescriber for signatures.    Paul Benjamin  Medication Assistance Coordinator  [phone]

## 2022-04-12 ENCOUNTER — Encounter: Admit: 2022-04-12 | Discharge: 2022-04-12 | Payer: MEDICARE

## 2022-04-12 DIAGNOSIS — E1165 Type 2 diabetes mellitus with hyperglycemia: Secondary | ICD-10-CM

## 2022-04-12 MED ORDER — INSULIN DEGLUDEC 100 UNIT/ML (3 ML) SC INPN
1 refills | 30.00000 days | Status: AC
Start: 2022-04-12 — End: ?

## 2022-04-13 ENCOUNTER — Encounter: Admit: 2022-04-13 | Discharge: 2022-04-13 | Payer: MEDICARE

## 2022-04-13 NOTE — Progress Notes
An application has been submitted to [employer] for Jardiance.      Paul Benjamin  Medication Assistance Coordinator  [phone]

## 2022-04-13 NOTE — Telephone Encounter
VM received from pt wife  States he is out of [employer]  Needs new Rx  Sent message via MyChart  Noted that shipment went out today per Med Mgmt tab

## 2022-04-19 ENCOUNTER — Encounter: Admit: 2022-04-19 | Discharge: 2022-04-19 | Payer: MEDICARE

## 2022-04-19 NOTE — Progress Notes
Patient does not meet criteria for medication assistance for Jardiance at this time due to exceeding the income requirements of the program.     Allayne Stack  Medication Assistance Coordinator  [phone]

## 2022-04-24 ENCOUNTER — Encounter: Admit: 2022-04-24 | Discharge: 2022-04-24 | Payer: MEDICARE

## 2022-04-24 NOTE — Progress Notes
MEDICATION ASSISTANCE PROGRAM (MAP)  MANUFACTURER SUPPLIED MEDICATION    Drug: Multimedia programmer Program: [employer]  Status: Approved  Enrollment Period: 04/24/2022-03/26/2023   MAP or Drug Company to [employer]: Drug company  Refill Info: Pt to call [phone] for refills    Notes: Patient eligibility is based off insurance status and/or dx and patient must continue to meet all criteria to remain in the program. Please notify the MAP Program of any changes.     Allayne Stack  Medication Assistance Coordinator  [phone]

## 2022-05-01 ENCOUNTER — Encounter: Admit: 2022-05-01 | Discharge: 2022-05-01 | Payer: MEDICARE

## 2022-05-01 NOTE — Telephone Encounter
VM received requesting LOV noted  Faxed LOV from 04/05/2022  Confirmation of successful fax received

## 2022-05-15 ENCOUNTER — Encounter: Admit: 2022-05-15 | Discharge: 2022-05-15 | Payer: MEDICARE

## 2022-05-21 ENCOUNTER — Encounter: Admit: 2022-05-21 | Discharge: 2022-05-21 | Payer: MEDICARE

## 2022-05-29 ENCOUNTER — Encounter: Admit: 2022-05-29 | Discharge: 2022-05-29 | Payer: MEDICARE

## 2022-05-29 ENCOUNTER — Ambulatory Visit: Admit: 2022-05-29 | Discharge: 2022-05-30 | Payer: MEDICARE

## 2022-05-29 DIAGNOSIS — E119 Type 2 diabetes mellitus without complications: Secondary | ICD-10-CM

## 2022-05-29 DIAGNOSIS — I4891 Unspecified atrial fibrillation: Secondary | ICD-10-CM

## 2022-05-29 DIAGNOSIS — E7800 Pure hypercholesterolemia, unspecified: Secondary | ICD-10-CM

## 2022-05-29 DIAGNOSIS — I2510 Atherosclerotic heart disease of native coronary artery without angina pectoris: Secondary | ICD-10-CM

## 2022-05-29 DIAGNOSIS — I495 Sick sinus syndrome: Secondary | ICD-10-CM

## 2022-05-29 DIAGNOSIS — N289 Disorder of kidney and ureter, unspecified: Secondary | ICD-10-CM

## 2022-05-29 DIAGNOSIS — I10 Essential (primary) hypertension: Secondary | ICD-10-CM

## 2022-05-29 DIAGNOSIS — G4730 Sleep apnea, unspecified: Secondary | ICD-10-CM

## 2022-05-29 NOTE — Patient Instructions
Cystoscopy    Cystoscopy is a procedure that lets your doctor look directly inside your urethra and bladder. It can be used to:  Help diagnose a problem with your urethra, bladder, or kidneys.  Take a sample (biopsy) of bladder or urethral tissue.  Treat certain problems (such as removing kidney stones).  Place a stent to bypass an obstruction.  Take special x-rays of the kidneys.  Based on the findings, your doctor may recommend other tests or treatments.    What Is a Cystoscope?  A cystoscope is a telescope-like instrument that contains lenses and fiberoptics (small glass wires that make bright light). The cystoscope may be straight and rigid, or flexible to bend around curves in the urethra. The doctor may look directly into the cystoscope, or project the image onto a monitor.    Getting Ready  To prepare, stop taking any medications as instructed. Ask whether you should avoid eating or drinking anything after midnight before the procedure. Follow any other instructions your doctor gives you.    Tell your doctor before the exam if you:   Take any medications, such as aspirin or blood thinners  Have allergies to any medications  Are pregnant      The Procedure  Cystoscopy is done in the doctor?s office or hospital. The doctor and sometimes a nurse are present during the procedure. It takes only a few minutes, longer if a biopsy, x-ray, or treatment needs to be done. During the procedure:  You lie on an exam table on your back, knees bent and legs apart. You are covered with a drape.  Your urethra and the area around it are washed. Anesthetic jelly may be applied to numb the urethra. Other pain medication is usually not needed. In some cases, you may be offered a mild sedative to help you relax. If a more extensive procedure is to be done, such as a biopsy or kidney stone removal, general anesthesia may be needed.  The cystoscope is inserted. A sterile fluid is put into the bladder to expand it. You may feel pressure from this fluid.  When the procedure is done, the cystoscope is removed.        Cystoscopy  ? 2010 [place], [place] has certain rights    After the Procedure  If you had a sedative, general anesthesia, or spinal anesthesia, you must have someone drive you home. Once you?re home:  Drink plenty of fluids.  You may have burning or light bleeding when you urinate--this is normal.  Medications may be prescribed to ease any discomfort or prevent infection. Take these as directed.  Call your doctor if you have heavy bleeding or blood clots, burning that lasts more than a day, a fever over 101?F , or trouble urinating.    ? [address], [address] [place], [place] [address]. All rights reserved. This information is not intended as a substitute for professional medical care. Always follow your healthcare professional's instructions.

## 2022-06-13 ENCOUNTER — Encounter: Admit: 2022-06-13 | Discharge: 2022-06-13 | Payer: MEDICARE

## 2022-06-13 DIAGNOSIS — N1832 Stage 3b chronic kidney disease (HCC): Secondary | ICD-10-CM

## 2022-06-20 ENCOUNTER — Encounter: Admit: 2022-06-20 | Discharge: 2022-06-20 | Payer: MEDICARE

## 2022-06-20 ENCOUNTER — Ambulatory Visit: Admit: 2022-06-20 | Discharge: 2022-06-20 | Payer: MEDICARE

## 2022-06-20 ENCOUNTER — Ambulatory Visit: Admit: 2022-06-20 | Discharge: 2022-06-21 | Payer: MEDICARE

## 2022-06-20 DIAGNOSIS — N1832 Stage 3b chronic kidney disease (HCC): Secondary | ICD-10-CM

## 2022-06-20 DIAGNOSIS — I10 Essential (primary) hypertension: Secondary | ICD-10-CM

## 2022-06-20 DIAGNOSIS — G4730 Sleep apnea, unspecified: Secondary | ICD-10-CM

## 2022-06-20 DIAGNOSIS — E1122 Type 2 diabetes mellitus with diabetic chronic kidney disease: Secondary | ICD-10-CM

## 2022-06-20 DIAGNOSIS — I2510 Atherosclerotic heart disease of native coronary artery without angina pectoris: Secondary | ICD-10-CM

## 2022-06-20 DIAGNOSIS — I4891 Unspecified atrial fibrillation: Secondary | ICD-10-CM

## 2022-06-20 DIAGNOSIS — E7800 Pure hypercholesterolemia, unspecified: Secondary | ICD-10-CM

## 2022-06-20 DIAGNOSIS — N289 Disorder of kidney and ureter, unspecified: Secondary | ICD-10-CM

## 2022-06-20 DIAGNOSIS — E119 Type 2 diabetes mellitus without complications: Secondary | ICD-10-CM

## 2022-06-20 DIAGNOSIS — I495 Sick sinus syndrome: Secondary | ICD-10-CM

## 2022-06-20 LAB — URINALYSIS DIPSTICK
NITRITE: NEGATIVE
URINE ASCORBIC ACID, UA: NEGATIVE
URINE BILE: NEGATIVE
URINE KETONE: NEGATIVE

## 2022-06-20 LAB — BASIC METABOLIC PANEL
CHLORIDE: 107 MMOL/L (ref 98–110)
POTASSIUM: 4.8 MMOL/L (ref 3.5–5.1)
SODIUM: 144 MMOL/L (ref 137–147)

## 2022-06-20 LAB — CBC AND DIFF
ABSOLUTE BASO COUNT: 0 K/UL (ref 0–0.20)
ABSOLUTE EOS COUNT: 0.1 K/UL (ref 0–0.45)
ABSOLUTE LYMPH COUNT: 0.5 K/UL — ABNORMAL LOW (ref 1.0–4.8)
ABSOLUTE MONO COUNT: 0.5 K/UL (ref 0–0.80)
ABSOLUTE NEUTROPHIL: 4.6 K/UL — ABNORMAL HIGH (ref 1.8–7.0)
BASOPHILS %: 1 % (ref 0–2)
EOSINOPHILS %: 2 % (ref 0–5)
HEMATOCRIT: 35 % — ABNORMAL LOW (ref 40–50)
HEMOGLOBIN: 12 g/dL — ABNORMAL LOW (ref 13.5–16.5)
LYMPHOCYTES %: 9 % — ABNORMAL LOW (ref 24–44)
MCH: 28 pg (ref 26–34)
MCHC: 33 g/dL (ref 32.0–36.0)
MCV: 87 FL (ref 80–100)
MONOCYTES %: 10 % (ref >60)
MPV: 6.9 FL — ABNORMAL LOW (ref 7–11)
NEUTROPHILS %: 78 % — ABNORMAL HIGH (ref 41–77)
PLATELET COUNT: 226 K/UL (ref 150–400)
RBC COUNT: 4.1 M/UL — ABNORMAL LOW (ref 4.4–5.5)
WBC COUNT: 6 K/UL (ref 4.5–11.0)

## 2022-06-20 LAB — IRON + BINDING CAPACITY + %SAT+ FERRITIN
% SATURATION: 24 % — ABNORMAL LOW (ref 28–42)
FERRITIN: 157 ng/mL (ref 30–300)
IRON BINDING: 244 ug/dL — ABNORMAL LOW (ref 270–380)
IRON: 58 ug/dL (ref 50–185)

## 2022-06-20 LAB — MICROALB/CR RATIO-URINE RANDOM
MICROALBUMIN, RAN: 40 ug/mL (ref 0–3)
MICROALBUMIN/CR RATIO URINE: 131 ug/mg — ABNORMAL HIGH (ref <30)
UR CREATININE, RAN: 31 mg/dL (ref 5.0–8.0)

## 2022-06-20 LAB — PARATHYROID HORMONE: PTH HORMONE: 58 pg/mL — ABNORMAL HIGH (ref 10–65)

## 2022-06-20 LAB — PROTEIN/CR RATIO,UR RAN: UR TOTAL PROTEIN,RAN: 17 mg/dL

## 2022-06-20 LAB — 25-OH VITAMIN D (D2 + D3): VITAMIN D (25-OH) TOTAL: 86 ng/mL — ABNORMAL HIGH (ref >40)

## 2022-06-20 LAB — PHOSPHORUS: PHOSPHORUS: 4.5 mg/dL (ref 2.0–4.5)

## 2022-06-20 LAB — URINALYSIS, MICROSCOPIC

## 2022-06-20 NOTE — Patient Instructions
The following are some measures which may help to slow down the progression of kidney disease:      Maintain adequate fluid intake.    Acute illnesses like the flu and COVID which may end up in hospitalization may impact on  kidney function and could cause the kidneys to fail.    It is important to keep up with  regular vaccinations for these diseases including the pneumonia vaccine as well.    If you develop diarrhea or vomiting and  are unable to keep up with your fluid intake, please report to the nearest ED since it may affect blood pressure and affect the blood flow to the kidney.    Avoid medications known as  NSAIDs which  include Advil, Motrin,Aleve, ibuprofen, naproxen.    Some medications used in patients with kidney disease including diuretics [water pill], some blood pressure medications [ACE inhibitors and ARB] like lisinopril, losartan may need to be held if you develop severe diarrhea or vomiting.  Under these conditions these medications may worsen kidney function and therefore need to be held until diarhea/vomiting resolves. If you have to hold your blood pressure or water pill medication for any of these reasons, please inform the provider who prescribed the medication.    Some medications used for stomach acid suppression  known as Proton Pump Inhibitors (which include pantoprazole, omeprazole, lansoprazole, nexium, protonix, prilosec) have been associated with kidney disease through unknown mechanisms. Unless you have a compelling indication for any of these medications , consider switching to a different class of medications known as histamine-2 receptor blockers eg. famotidine, ranitidine  etc (your pharmacist or PCP can recommend a brand).    Nutrition management is a critical component in slowing kidney disease progression.  Visit the following web site for information on diet in patients with kidney disease:  https://www.kidney.org/nutrition

## 2022-06-25 ENCOUNTER — Encounter: Admit: 2022-06-25 | Discharge: 2022-06-25 | Payer: MEDICARE

## 2022-06-26 ENCOUNTER — Encounter: Admit: 2022-06-26 | Discharge: 2022-06-26 | Payer: MEDICARE

## 2022-06-26 DIAGNOSIS — E781 Pure hyperglyceridemia: Secondary | ICD-10-CM

## 2022-06-26 DIAGNOSIS — I4891 Unspecified atrial fibrillation: Secondary | ICD-10-CM

## 2022-06-26 DIAGNOSIS — G4730 Sleep apnea, unspecified: Secondary | ICD-10-CM

## 2022-06-26 DIAGNOSIS — E119 Type 2 diabetes mellitus without complications: Secondary | ICD-10-CM

## 2022-06-26 DIAGNOSIS — I480 Paroxysmal atrial fibrillation: Secondary | ICD-10-CM

## 2022-06-26 DIAGNOSIS — Z9229 Personal history of other drug therapy: Secondary | ICD-10-CM

## 2022-06-26 DIAGNOSIS — I2510 Atherosclerotic heart disease of native coronary artery without angina pectoris: Secondary | ICD-10-CM

## 2022-06-26 DIAGNOSIS — E7800 Pure hypercholesterolemia, unspecified: Secondary | ICD-10-CM

## 2022-06-26 DIAGNOSIS — I495 Sick sinus syndrome: Secondary | ICD-10-CM

## 2022-06-26 DIAGNOSIS — I10 Essential (primary) hypertension: Secondary | ICD-10-CM

## 2022-06-26 DIAGNOSIS — N289 Disorder of kidney and ureter, unspecified: Secondary | ICD-10-CM

## 2022-06-26 DIAGNOSIS — E118 Type 2 diabetes mellitus with unspecified complications: Secondary | ICD-10-CM

## 2022-06-26 DIAGNOSIS — I509 Heart failure, unspecified: Secondary | ICD-10-CM

## 2022-06-26 DIAGNOSIS — E669 Obesity, unspecified: Secondary | ICD-10-CM

## 2022-06-26 DIAGNOSIS — E1122 Type 2 diabetes mellitus with diabetic chronic kidney disease: Secondary | ICD-10-CM

## 2022-06-26 DIAGNOSIS — I5032 Chronic diastolic (congestive) heart failure: Secondary | ICD-10-CM

## 2022-06-26 NOTE — Progress Notes
Date of Service: 06/26/2022    Paul Benjamin is a 77 y.o. male.       HPI     Paul Benjamin was in the [facility] today, accompanied by his wife.  He is followed for coronary disease, atrial fibrillation, and diastolic heart failure.  He had previously been a patient of Dr. Pierre Bali and I saw him for the first time last September, 2022.     He uses a walker and does not get out a lot in the winter months.  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     His cardiac situation seems to be stable.  He had typical angina prior to his coronary stent procedure over 15 years ago.  He has had no recurrence of those symptoms and has had stress testing intermittently over the years.  Similarly, his diastolic heart failure appears to be pretty stable on the current medical program.  His exertional breathlessness has not worsened and his weight remains quite stable.  He has CKD and his nephrologist recently discontinued losartan.     His atrial fibrillation was never symptomatic but he has a pacemaker and there is been no evidence of recurrent arrhythmia after his ablation procedure.     He is not having any problems with TIA or stroke symptoms.  The decision had been made previously to discontinue oral anticoagulation after his successful ablation procedure and based on the lack of recurrence evidenced by device checks.         Vitals:    06/26/22 0854   BP: (!) 162/91   BP Source: Arm, Left Upper   Pulse: 75   SpO2: 95%   O2 Device: None (Room air)   PainSc: Zero   Weight: 103.9 kg (229 lb)   Height: 177.8 cm (5' 10)     Body mass index is 32.86 kg/m?Marland Kitchen     Past Medical History  Patient Active Problem List    Diagnosis Date Noted    Overactive bladder (OAB)      Urinary frequency, urgency, nocturia, urge urinary incontinence (UUI).      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         a.  Recurrent bouts of atrial fibrillation but completely asymptomatic.  3/09:  Hospital admission for sotalol initiation and cardioversion  09/23/07 A fib ablation:  Pulmonary vein antral isolation, ligament of marshall ablation, posterior roof line, cavotricuspid isthmus ablation  6/09 event monitor:  No recurrent AF      Tachy-brady syndrome (HCC) 09/30/2006     8/08 s/p dual chamber PPM implant.  Implanted device is a MDT Adapta [id number]      CAD (coronary artery disease) 09/30/2006     01/21/04 Heart catheterization - Angioplasty and stenting of the anterior descending coronary artery on 01/21/04 with a Cypher drug-eluting stent used for the LAD. The right coronary and circumflex showed only minimal disease. The ejection fraction was 45-50%.   07/18/2004:  Thallium showing EF of 53% with minimal amount of ischemia involving the               inferolateral segment.  3/09 adenosine thallium stress:  This study suggests a low likelihood for ischemic/jeopardized myocardium. No definite fixed or reversible defects are evident. High risk scintigraphic features are absent. Although the calculated LV ejection fraction was reduced, by visual estimation, the EF would appear at the lower limits of normal. The left ventricle is within normal limits for size.     7/11 thallium:  SUMMARY/OPINION: This study is probably normal. There is a small defect involving the inferior wall. The majority of the defect is fixed while more distally there is a subtle degree of reversibility. Overall I think this probably represents diaphragmatic attenuation though a very slight degree of ischemia cannot be completely excluded. The left ventricular systolic function is normal with a calculated ejection fraction of 61 percent. The end diastolic volume is normal at 100 mL. The pulmonary to myocardial count ratio is normal at 0.36. There is no transient ischemic dilatation.   This study was compared to a myocardial perfusion study obtained in our office on May 26, 2007. The prior study demonstrated an EF of 43 percent though visually it was felt to be in more of the 50-55 percent range. The prior study also demonstrated a perfusion abnormality involving the inferior wall which had some mild reversibility but overall was felt to be due to diaphragmatic attenuation.   In comparing the two studies qualitatively, both demonstrated an inferior wall defect. The prior study had more reversibility involving the basal to mid section while the current study has some mild reversibility involving the distal section. Overall I think both studies probably represent diaphragmatic attenuation.     05/03/2015 - Stress Test:    This study is probably normal with inferior attenuation with a small sized mild intensity reversible defect in the basal inferior wall consistent with  diaphragmatic attenuation. Left ventricular systolic function is normal. There are no high risk prognostic indicators present.  The ECG portion of the study is negative for ischemia.          Hypertension 09/30/2006    Mixed dyslipidemia 09/30/2006    Sleep apnea 09/30/2006         Review of Systems   Constitutional: Negative.   HENT: Negative.     Eyes: Negative.    Cardiovascular: Negative.    Respiratory: Negative.     Endocrine: Negative.    Hematologic/Lymphatic: Negative.    Skin: Negative.    Musculoskeletal: Negative.    Gastrointestinal: Negative.    Genitourinary: Negative.    Neurological: Negative.    Psychiatric/Behavioral: Negative.     Allergic/Immunologic: Negative.        Physical Exam    Physical Exam   General Appearance: no distress   Skin: warm, no ulcers or xanthomas   Digits and Nails: no cyanosis or clubbing   Eyes: conjunctivae and  lids normal, pupils are equal and round   Teeth/Gums/Palate: dentition unremarkable, no lesions   Lips & Oral Mucosa: no pallor or cyanosis   Neck Veins: normal JVP , neck veins are not distended   Thyroid: no nodules, masses, tenderness or enlargement   Chest Inspection: chest is normal in appearance   Respiratory Effort: breathing comfortably, no respiratory distress   Auscultation/Percussion: lungs clear to auscultation, no rales or rhonchi, no wheezing   PMI: PMI not enlarged or displaced   Cardiac Rhythm: regular rhythm and normal rate   Cardiac Auscultation: S1, S2 normal, no rub, no gallop   Murmurs: no murmur   Peripheral Circulation: normal peripheral circulation   Carotid Arteries: normal carotid upstroke bilaterally, no bruits   Radial Arteries: normal symmetric radial pulses   Abdominal Aorta: no abdominal aortic bruit   Pedal Pulses: normal symmetric pedal pulses   Lower Extremity Edema: no lower extremity edema   Abdominal Exam: soft, non-tender, no masses, bowel sounds normal   Liver & Spleen: no organomegaly   Gait & Station: walks with a walker  Muscle Strength: normal muscle tone   Orientation: oriented to time, place and person   Affect & Mood: appropriate and sustained affect   Language and Memory: patient responsive and seems to comprehend information   Neurologic Exam: neurological assessment grossly intact   Other: moves all extremities        Cardiovascular Health Factors  Vitals BP Readings from Last 3 Encounters:   06/26/22 (!) 162/91   06/20/22 (P) 102/66   05/29/22 118/72     Wt Readings from Last 3 Encounters:   06/26/22 103.9 kg (229 lb)   06/20/22 (P) 106.1 kg (234 lb)   05/29/22 107 kg (236 lb)     BMI Readings from Last 3 Encounters:   06/26/22 32.86 kg/m?   06/20/22 (P) 34.56 kg/m?   05/29/22 34.85 kg/m?      Smoking Social History     Tobacco Use   Smoking Status Former    Current packs/day: 0.00    Average packs/day: 1 pack/day for 4.0 years (4.0 ttl pk-yrs)    Types: Cigarettes    Start date: 03/26/1962    Quit date: 03/26/1966    Years since quitting: 56.2   Smokeless Tobacco Never      Lipid Profile Cholesterol   Date Value Ref Range Status   06/05/2021 145  Final     HDL   Date Value Ref Range Status   06/05/2021 35 (L) >40 Final     LDL   Date Value Ref Range Status   06/05/2021 75  Final     Triglycerides   Date Value Ref Range Status   06/05/2021 179 (H) <150 Final      Blood Sugar Hemoglobin A1C   Date Value Ref Range Status   10/17/2021 6.2  Final     Glucose   Date Value Ref Range Status   06/20/2022 109 (H) 70 - 100 MG/DL Final   16/12/9602 540 (H) 70 - 105 Final   07/18/2021 114  Final   01/06/2016 113 (H) 70 - 100 mg/dL Final   98/01/9146 829 (H) 70 - 110 MG/DL Final   56/21/3086 578 (H) 70 - 110 MG/DL Final     Glucose, POC   Date Value Ref Range Status   04/05/2022 165 (A) 70 - 100 Final   09/14/2021 153  Final   11/10/2020 104  Final  Problems Addressed Today  Encounter Diagnoses   Name Primary?    Controlled type 2 diabetes mellitus with complication, with long-term current use of insulin (HCC) Yes    Coronary artery disease involving native coronary artery of native heart without angina pectoris     Primary hypertension     Tachy-brady syndrome (HCC)     Chronic heart failure with preserved ejection fraction (HFpEF) (HCC)     Obesity, morbid (HCC)     Hypercholesterolemia Paroxysmal atrial fibrillation (HCC)     SSS (sick sinus syndrome) (HCC)     CKD stage 4 due to type 2 diabetes mellitus (HCC)        Assessment and Plan       Hypertension  Home BP averages 130/80.    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  Hgb A1c was 6% in January.    Hypercholesterolemia  Lab Results   Component Value Date    CHOL 145 06/05/2021    TRIG 179 (H) 06/05/2021    HDL 35 (L) 06/05/2021    LDL 75 06/05/2021    VLDL 36 06/05/2021    NONHDLCHOL 94 12/03/2019    CHOLHDLC 4 06/05/2021      LDL close to goal.    A-fib (HCC)  No AF on device check in February.  No anti-coagulation.    CAD (coronary artery disease)  No angina symptoms.  Last echo 06/2020 showed EF 70%.      SSS (sick sinus syndrome) (HCC)  Last device check February, 2024, shows 45% atrial pacing.  4-years battery longevity.    CKD stage 4 due to type 2 diabetes mellitus (HCC)  His nephrologist discontinued losartan, but BP is still OK.      Current Medications (including today's revisions)   allopurinol (ZYLOPRIM) 100 mg tablet Take one tablet by mouth twice daily.    aspirin EC (ASPIR-LOW) 81 mg tablet Take one tablet by mouth daily. Take with food.    atorvastatin (LIPITOR) 40 mg tablet Take one tablet by mouth daily.    calcium carbonate (TUMS) 500 mg (200 mg elemental calcium) chewable tablet Chew one tablet by mouth as Needed. Indications: heartburn    carvediloL (COREG) 12.5 mg tablet Take one tablet by mouth twice daily with meals. Take 1/4 tablet twice per day    CHOLEcalciferoL (vitamin D3) (OPTIMAL D3) 50,000 units capsule TAKE 1 CAPSULE BY MOUTH ONCE WEEKLY ON WEDNESDAYS    docusate (COLACE) 100 mg capsule Take one capsule by mouth daily.    empagliflozin (JARDIANCE) 10 mg tablet Take one tablet by mouth daily.    ferrous sulfate (FEOSOL) 325 mg (65 mg iron) tablet Take one tablet by mouth twice daily. flash glucose sensor (FREESTYLE LIBRE 2 SENSOR) sensor Use as directed to continuously monitor glucose, changing every 14 days. Diagnosis Code: E11.65  Indications: type 2 diabetes mellitus    FREESTYLE LITE STRIPS test strip Use one strip as directed before meals and at bedtime.    furosemide (LASIX) 80 mg tablet Take one tablet by mouth every morning. alternating w/ 2 tab every other day    gabapentin (NEURONTIN) 100 mg capsule Take one capsule by mouth at bedtime daily.    insulin degludec (TRESIBA FLEXTOUCH U-100) 100 unit/mL (3 mL) subcutaneous PEN Lower Tresiba to 20 units, if your morning readings are under 110 you lower another 2 units, until most morning  readings are 110-130    levothyroxine (SYNTHROID) 75 mcg tablet Take one tablet by mouth daily.    nitroglycerin (NITROSTAT) 0.4 mg tablet Place 1 tablet under tongue every 5 minutes as needed for Chest Pain.    pen needle, diabetic (RELION PEN NEEDLES) 32 gauge x 5/32 pen needle Use one each as directed before meals and at bedtime. Use with insulin injections.    potassium chloride (K-TAB) 20 mEq tablet Take one tablet by mouth daily. Take additional tablet when taking metalozone    pramipexole (MIRAPEX) 1.5 mg PO Tab Take one tablet by mouth at bedtime daily.    semaglutide (OZEMPIC) 1 mg/dose (4 mg/3 mL) injection PEN Inject 1 mg under the skin every 7 days.    traMADoL (ULTRAM) 50 mg tablet Take one tablet by mouth as Needed.    trospium (SANCTURA) 20 mg tablet Take one tablet by mouth twice daily.     Total time spent on today's office visit was 45 minutes.  This includes face-to-face in person visit with patient as well as nonface-to-face time including review of the EMR, outside records, labs, radiologic studies, echocardiogram & other cardiovascular studies, formation of treatment plan, after visit summary, future disposition, and lastly on documentation.

## 2022-06-26 NOTE — Assessment & Plan Note
Lab Results   Component Value Date    CHOL 145 06/05/2021    TRIG 179 (H) 06/05/2021    HDL 35 (L) 06/05/2021    LDL 75 06/05/2021    VLDL 36 06/05/2021    NONHDLCHOL 94 12/03/2019    CHOLHDLC 4 06/05/2021      LDL close to goal.

## 2022-06-26 NOTE — Assessment & Plan Note
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  Hgb A1c was 6% in January.

## 2022-06-26 NOTE — Assessment & Plan Note
His nephrologist discontinued losartan, but BP is still OK.

## 2022-06-26 NOTE — Assessment & Plan Note
Last device check February, 2024, shows 45% atrial pacing.  4-years battery longevity.

## 2022-06-26 NOTE — Assessment & Plan Note
No AF on device check in February.  No anti-coagulation.

## 2022-06-26 NOTE — Assessment & Plan Note
Home BP averages 130/80.

## 2022-06-27 ENCOUNTER — Encounter: Admit: 2022-06-27 | Discharge: 2022-06-27 | Payer: MEDICARE

## 2022-07-02 ENCOUNTER — Encounter: Admit: 2022-07-02 | Discharge: 2022-07-02 | Payer: MEDICARE

## 2022-07-02 NOTE — Telephone Encounter
Called patient to remind regarding having BMP drawn. No answer. Left message. Then messaged patient on Epic.

## 2022-07-02 NOTE — Telephone Encounter
-----   Message from Doran Durand, MD sent at 06/29/2022  3:39 PM CDT -----  Kindly remind Mr DOMINQUE KRAVCHUK to repeat his BMP. Thank you  Doran Durand, MD      ----- Message -----  From: Leory Plowman, In Results Misys  Sent: 06/20/2022  11:59 AM CDT  To: Doran Durand, MD

## 2022-08-08 ENCOUNTER — Encounter: Admit: 2022-08-08 | Discharge: 2022-08-08 | Payer: MEDICARE

## 2022-08-09 ENCOUNTER — Encounter: Admit: 2022-08-09 | Discharge: 2022-08-09 | Payer: MEDICARE

## 2022-08-09 NOTE — Telephone Encounter
Received a faxed DWO from [employer] for CGM supplies Paul Benjamin)  Placed in [employer] for signature

## 2022-08-14 ENCOUNTER — Encounter: Admit: 2022-08-14 | Discharge: 2022-08-14 | Payer: MEDICARE

## 2022-08-14 DIAGNOSIS — R110 Nausea: Secondary | ICD-10-CM

## 2022-08-16 ENCOUNTER — Encounter: Admit: 2022-08-16 | Discharge: 2022-08-16 | Payer: MEDICARE

## 2022-08-16 ENCOUNTER — Ambulatory Visit: Admit: 2022-08-16 | Discharge: 2022-08-16 | Payer: MEDICARE

## 2022-08-16 DIAGNOSIS — R110 Nausea: Secondary | ICD-10-CM

## 2022-08-16 MED ORDER — RP DX TC-99M SULF COLL MCI
1 | Freq: Once | ORAL | 0 refills | Status: CP
Start: 2022-08-16 — End: ?
  Administered 2022-08-16: 15:00:00 1.1 via ORAL

## 2022-08-17 ENCOUNTER — Encounter: Admit: 2022-08-17 | Discharge: 2022-08-17 | Payer: MEDICARE

## 2022-08-17 DIAGNOSIS — I495 Sick sinus syndrome: Secondary | ICD-10-CM

## 2022-08-17 DIAGNOSIS — Z950 Presence of cardiac pacemaker: Secondary | ICD-10-CM

## 2022-08-17 DIAGNOSIS — I480 Paroxysmal atrial fibrillation: Secondary | ICD-10-CM

## 2022-09-13 ENCOUNTER — Encounter: Admit: 2022-09-13 | Discharge: 2022-09-13 | Payer: MEDICARE

## 2022-09-13 DIAGNOSIS — E1165 Type 2 diabetes mellitus with hyperglycemia: Secondary | ICD-10-CM

## 2022-09-13 MED ORDER — INSULIN DEGLUDEC 100 UNIT/ML (3 ML) SC INPN
1 refills | 30.00000 days | Status: AC
Start: 2022-09-13 — End: ?

## 2022-09-13 NOTE — Telephone Encounter
Tresiba U 100  Last refill 04/12/2022 for 20 ml with 1 refill  Last office note 04/05/2022 Next office visit 09/20/2022    Refilled for 20 ml with 1 refill

## 2022-09-14 ENCOUNTER — Encounter: Admit: 2022-09-14 | Discharge: 2022-09-14 | Payer: MEDICARE

## 2022-09-17 ENCOUNTER — Encounter: Admit: 2022-09-17 | Discharge: 2022-09-17 | Payer: MEDICARE

## 2022-09-19 ENCOUNTER — Encounter: Admit: 2022-09-19 | Discharge: 2022-09-19 | Payer: MEDICARE

## 2022-09-20 ENCOUNTER — Encounter: Admit: 2022-09-20 | Discharge: 2022-09-20 | Payer: MEDICARE

## 2022-09-20 ENCOUNTER — Ambulatory Visit: Admit: 2022-09-20 | Discharge: 2022-09-20 | Payer: MEDICARE

## 2022-09-20 ENCOUNTER — Ambulatory Visit: Admit: 2022-09-20 | Discharge: 2022-09-21 | Payer: MEDICARE

## 2022-09-20 DIAGNOSIS — E119 Type 2 diabetes mellitus without complications: Secondary | ICD-10-CM

## 2022-09-20 DIAGNOSIS — I495 Sick sinus syndrome: Secondary | ICD-10-CM

## 2022-09-20 DIAGNOSIS — I10 Essential (primary) hypertension: Secondary | ICD-10-CM

## 2022-09-20 DIAGNOSIS — E1165 Type 2 diabetes mellitus with hyperglycemia: Secondary | ICD-10-CM

## 2022-09-20 DIAGNOSIS — E7800 Pure hypercholesterolemia, unspecified: Secondary | ICD-10-CM

## 2022-09-20 DIAGNOSIS — I4891 Unspecified atrial fibrillation: Secondary | ICD-10-CM

## 2022-09-20 DIAGNOSIS — N289 Disorder of kidney and ureter, unspecified: Secondary | ICD-10-CM

## 2022-09-20 DIAGNOSIS — G4730 Sleep apnea, unspecified: Secondary | ICD-10-CM

## 2022-09-20 DIAGNOSIS — E781 Pure hyperglyceridemia: Secondary | ICD-10-CM

## 2022-09-20 DIAGNOSIS — E669 Obesity, unspecified: Secondary | ICD-10-CM

## 2022-09-20 DIAGNOSIS — I509 Heart failure, unspecified: Secondary | ICD-10-CM

## 2022-09-20 DIAGNOSIS — I2510 Atherosclerotic heart disease of native coronary artery without angina pectoris: Secondary | ICD-10-CM

## 2022-09-20 DIAGNOSIS — Z9229 Personal history of other drug therapy: Secondary | ICD-10-CM

## 2022-09-20 LAB — COMPREHENSIVE METABOLIC PANEL
ALBUMIN: 3.6 g/dL (ref 3.5–5.0)
ALK PHOSPHATASE: 75 U/L (ref 25–110)
ALT: 16 U/L (ref 7–56)
ANION GAP: 9 (ref 3–12)
AST: 14 U/L (ref 7–40)
BLD UREA NITROGEN: 67 mg/dL — ABNORMAL HIGH (ref 7–25)
CALCIUM: 10 mg/dL (ref 8.5–10.6)
CHLORIDE: 110 MMOL/L (ref 98–110)
CO2: 25 MMOL/L (ref 21–30)
CREATININE: 2.5 mg/dL — ABNORMAL HIGH (ref 0.4–1.24)
GLUCOSE,PANEL: 81 mg/dL (ref 70–100)
POTASSIUM: 4.2 MMOL/L (ref 3.5–5.1)
SODIUM: 144 MMOL/L (ref 137–147)
TOTAL BILIRUBIN: 0.5 mg/dL (ref 0.2–1.3)
TOTAL PROTEIN: 6.5 g/dL (ref 6.0–8.0)

## 2022-10-02 ENCOUNTER — Encounter: Admit: 2022-10-02 | Discharge: 2022-10-02 | Payer: MEDICARE

## 2022-10-08 ENCOUNTER — Encounter: Admit: 2022-10-08 | Discharge: 2022-10-08 | Payer: MEDICARE

## 2022-10-08 NOTE — Telephone Encounter
Received a call from patient's spouse inquiring about sending an order for [facility] 3 sensors and did not recall change in insulin dosage.  Returned call and informed a new order for Josephine Igo 3 has been sent to [employer] through [facility]ute and reviewed the instructions for Tresiba per LOV .          Lower Tresiba to 20 units, if your morning readings are under 110 you lower another 2 units, until most morning readings are 110-130           Also, sent in my chart message to have as reference.

## 2022-11-16 ENCOUNTER — Encounter: Admit: 2022-11-16 | Discharge: 2022-11-15 | Payer: MEDICARE

## 2022-11-19 ENCOUNTER — Encounter: Admit: 2022-11-19 | Discharge: 2022-11-19 | Payer: MEDICARE

## 2022-12-14 ENCOUNTER — Encounter: Admit: 2022-12-14 | Discharge: 2022-12-14 | Payer: MEDICARE

## 2022-12-14 DIAGNOSIS — N1832 Stage 3b chronic kidney disease (HCC): Secondary | ICD-10-CM

## 2022-12-19 ENCOUNTER — Encounter: Admit: 2022-12-19 | Discharge: 2022-12-19 | Payer: MEDICARE

## 2022-12-19 DIAGNOSIS — N1832 Stage 3b chronic kidney disease (HCC): Secondary | ICD-10-CM

## 2022-12-19 NOTE — Progress Notes
Lab results that have arrived as of 12/19/2022

## 2022-12-20 ENCOUNTER — Encounter: Admit: 2022-12-20 | Discharge: 2022-12-20 | Payer: MEDICARE

## 2022-12-20 DIAGNOSIS — N1832 Stage 3b chronic kidney disease (HCC): Secondary | ICD-10-CM

## 2022-12-21 ENCOUNTER — Ambulatory Visit: Admit: 2022-12-21 | Discharge: 2022-12-22 | Payer: MEDICARE

## 2022-12-21 ENCOUNTER — Encounter: Admit: 2022-12-21 | Discharge: 2022-12-21 | Payer: MEDICARE

## 2022-12-21 DIAGNOSIS — E1122 Type 2 diabetes mellitus with diabetic chronic kidney disease: Secondary | ICD-10-CM

## 2022-12-21 DIAGNOSIS — N189 Chronic kidney disease, unspecified: Secondary | ICD-10-CM

## 2022-12-21 MED ORDER — IRON SUCROSE 300 MG IVPB
300 mg | Freq: Once | INTRAVENOUS | 0 refills
Start: 2022-12-21 — End: ?

## 2022-12-21 MED ORDER — DARBEPOETIN ALFA IN POLYSORBAT 60 MCG/0.3 ML IJ SYRG
60 ug | Freq: Once | SUBCUTANEOUS | 0 refills
Start: 2022-12-21 — End: ?

## 2022-12-21 NOTE — Progress Notes
I saw the patient in the company of his wife  History        CC: Evaluation management of CKD        HPI: Paul Benjamin is a 77 y.o. 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  He used to follow-up with Dr.Qasim and was last seen in March 2023.  Interval history 06-20-22: He had acute bronchitis and required a 10 day course of antibiotics which he just completed. He has SoB on exertion. He denies dizziness or lightheadedness.    Interval history 12-21-2022: No interval health events. He denies difficulty urinating/ changes in his urine. He experiences some shortness of breath with ambulation.  His wife states that the dose of furosemide was reduced and he is on a much lower does than is listed. He does not take NSAIDs.  According to his wife, the salt is his problem; he has to salt everything .        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  / [employer]tr[employer]    Type II diabetes mellitus (HCC)            Family History  Family History   Problem Relation Name Age of Onset    Asthma Father Jomarie Longs     Cardiovascular Father Jomarie Longs     Hypertension Mother Malena Catholic     High Cholesterol Mother Optician, dispensing         Social History  Social History     Socioeconomic History    Marital status: Married   Occupational History     Employer: [employer]     Comment: retired   Tobacco Use    Smoking status: Former     Current packs/day: 0.00     Average packs/day: 1 pack/day for 4.0 years (4.0 ttl pk-yrs)     Types: Cigarettes     Start date: 03/26/1962     Quit date: 03/26/1966 Years since quitting: 56.7    Smokeless tobacco: Never   Substance and Sexual Activity    Alcohol use: No     Comment: rarely    Drug use: Never    Sexual activity: Never        Medication    HOME MEDS   allopurinol (ZYLOPRIM) 100 mg tablet Take one tablet by mouth twice daily.    atorvastatin (LIPITOR) 40 mg tablet Take one tablet by mouth daily.    calcium carbonate (TUMS) 500 mg (200 mg elemental calcium) chewable tablet Chew one tablet by mouth as Needed. Indications: heartburn    carvediloL (COREG) 12.5 mg tablet Take one tablet by mouth twice daily with meals. Take 1/4 tablet twice per day    docusate (COLACE) 100 mg capsule Take one capsule by mouth daily.    empagliflozin (JARDIANCE) 10 mg tablet Take one  tablet by mouth daily.    ferrous sulfate (FEOSOL) 325 mg (65 mg iron) tablet Take one tablet by mouth twice daily.    flash glucose sensor (FREESTYLE LIBRE 2 SENSOR) sensor Use as directed to continuously monitor glucose, changing every 14 days. Diagnosis Code: E11.65  Indications: type 2 diabetes mellitus    FREESTYLE LITE STRIPS test strip Use one strip as directed before meals and at bedtime.    furosemide (LASIX) 80 mg tablet Take one tablet by mouth every morning. alternating w/ 2 tab every other day    gabapentin (NEURONTIN) 100 mg capsule Take one capsule by mouth at bedtime daily.    insulin degludec (TRESIBA FLEXTOUCH U-100) 100 unit/mL (3 mL) subcutaneous PEN Lower Tresiba to 20 units, if your morning readings are under 110 you lower another 2 units, until most morning readings are 110-130    levothyroxine (SYNTHROID) 75 mcg tablet Take one tablet by mouth daily.    nitroglycerin (NITROSTAT) 0.4 mg tablet Place 1 tablet under tongue every 5 minutes as needed for Chest Pain.    pen needle, diabetic (RELION PEN NEEDLES) 32 gauge x 5/32 pen needle Use one each as directed before meals and at bedtime. Use with insulin injections.    pramipexole (MIRAPEX) 1.5 mg PO Tab Take one tablet by mouth at bedtime daily.    semaglutide (OZEMPIC) 1 mg/dose (4 mg/3 mL) injection PEN Inject 1 mg under the skin every 7 days.    traMADoL (ULTRAM) 50 mg tablet Take one tablet by mouth as Needed.    trospium (SANCTURA) 20 mg tablet Take one tablet by mouth twice daily.          Review of Systems  Constitutional: negative  Eyes: negative  Ears, nose, mouth, throat, and face: negative  Respiratory: negative  Cardiovascular: negative  Gastrointestinal: negative  Genitourinary:negative  Integument/breast: negative  Hematologic/lymphatic: negative  Musculoskeletal:negative  Neurological: negative  Endocrine: negative      Physical Exam        Vitals:    12/21/22 1014   BP: 119/61   Pulse: 84   Weight: 97.5 kg (215 lb)     Mwf:20  40    Wt Readings from Last 5 Encounters:   12/21/22 97.5 kg (215 lb)   09/20/22 99.1 kg (218 lb 6.4 oz)   06/26/22 103.9 kg (229 lb)   06/20/22 (P) 106.1 kg (234 lb)   05/29/22 107 kg (236 lb)        Body mass index is 30.85 kg/m?.       Gen: Alert and Oriented, No Acute Distress   HEENT: Sclera normal; MMM  CV:  S1 and S2 normal, no rubs, murmurs or gallops   Pulm: Clear to Auscultation bilateral   GI: BS+ x4, non-tender to palpation  Neuro: Grossly normal, moving all extremities, speech intact  Ext: He has bilateral lower extremity edema.  Skin: no rash     Assessment and Plan        Paul Benjamin is a 77 y.o. 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  He has diabetes mellitus which is well controlled.  sCr shows worsening kidney function.  This could be due to underlying cystic disease and heart failure.  We discussed this including renal replacement therapy. I have referred him for  chronic kidney disease education.  Stop potassium supplement.  Stop vitamin D.    --Anemia of kidney disease: he is on oral iron without improvement in his iron stores.  I ordered therapy plan for iv iron and darbepoetin every 2 weeks      --Volume overload: He is on furosemide 20mg  on mwf and 40mg  on tts.  Increase this to furosemide 20mg  po twice daily.  Follow up bmp in 4 weeks.      -ADPKD: The use of tolvaptan has not been studied in  this age group and at this eGFR.  He underwent MRA of the brain in May 2023 without evidence of cerebral aneurysm.    --Heart failure: Continue GDMT as per cardiologist        Doran Durand, MD      Patient Instructions   The following are some measures which may help to slow down the progression of kidney disease:      Maintain adequate fluid intake.    Acute illnesses like the flu and COVID which may end up in hospitalization may impact on  kidney function and could cause the kidneys to fail.    It is important to keep up with  regular vaccinations for these diseases including the pneumonia vaccine as well.    If you develop diarrhea or vomiting and  are unable to keep up with your fluid intake, please report to the nearest ED since it may affect blood pressure and affect the blood flow to the kidney.    Avoid medications known as  NSAIDs which  include Advil, Motrin,Aleve, ibuprofen, naproxen.    Some medications used in patients with kidney disease including diuretics [water pill], some blood pressure medications [ACE inhibitors and ARB] like lisinopril, losartan may need to be held if you develop severe diarrhea or vomiting.  Under these conditions these medications may worsen kidney function and therefore need to be held until diarhea/vomiting resolves. If you have to hold your blood pressure or water pill medication for any of these reasons, please inform the provider who prescribed the medication.    Some medications used for stomach acid suppression  known as Proton Pump Inhibitors (which include pantoprazole, omeprazole, lansoprazole, nexium, protonix, prilosec) have been associated with kidney disease through unknown mechanisms. Unless you have a compelling indication for any of these medications , consider switching to a different class of medications known as histamine-2 receptor blockers eg. famotidine, ranitidine  etc (your pharmacist or PCP can recommend a brand).    Nutrition management is a critical component in slowing kidney disease progression.  Visit the following web site for information on diet in patients with kidney disease:  https://www.kidney.org/nutrition    Visit the following website for health education videos on kidney disease:  [email or web address].kidney.org/                 Total Time Today was 40 minutes in the following activities: Preparing to see the patient, Obtaining and/or reviewing separately obtained history, Performing a medically appropriate examination and/or evaluation, Counseling and educating the patient/family/caregiver, Ordering medications, tests, or procedures, Documenting clinical information in the electronic or other health record, and Independently interpreting results (not separately reported) and communicating results to the patient/family/caregiver

## 2022-12-21 NOTE — Patient Instructions
The following are some measures which may help to slow down the progression of kidney disease:      Maintain adequate fluid intake.    Acute illnesses like the flu and COVID which may end up in hospitalization may impact on  kidney function and could cause the kidneys to fail.    It is important to keep up with  regular vaccinations for these diseases including the pneumonia vaccine as well.    If you develop diarrhea or vomiting and  are unable to keep up with your fluid intake, please report to the nearest ED since it may affect blood pressure and affect the blood flow to the kidney.    Avoid medications known as  NSAIDs which  include Advil, Motrin,Aleve, ibuprofen, naproxen.    Some medications used in patients with kidney disease including diuretics [water pill], some blood pressure medications [ACE inhibitors and ARB] like lisinopril, losartan may need to be held if you develop severe diarrhea or vomiting.  Under these conditions these medications may worsen kidney function and therefore need to be held until diarhea/vomiting resolves. If you have to hold your blood pressure or water pill medication for any of these reasons, please inform the provider who prescribed the medication.    Some medications used for stomach acid suppression  known as Proton Pump Inhibitors (which include pantoprazole, omeprazole, lansoprazole, nexium, protonix, prilosec) have been associated with kidney disease through unknown mechanisms. Unless you have a compelling indication for any of these medications , consider switching to a different class of medications known as histamine-2 receptor blockers eg. famotidine, ranitidine  etc (your pharmacist or PCP can recommend a brand).    Nutrition management is a critical component in slowing kidney disease progression.  Visit the following web site for information on diet in patients with kidney disease:  https://www.kidney.org/nutrition    Visit the following website for health education videos on kidney disease:  [email or web address].kidney.[email or web address]

## 2022-12-22 DIAGNOSIS — N184 Chronic kidney disease, stage 4 (severe): Secondary | ICD-10-CM

## 2022-12-24 ENCOUNTER — Encounter: Admit: 2022-12-24 | Discharge: 2022-12-24 | Payer: MEDICARE

## 2022-12-25 ENCOUNTER — Encounter: Admit: 2022-12-25 | Discharge: 2022-12-25 | Payer: MEDICARE

## 2022-12-27 ENCOUNTER — Encounter: Admit: 2022-12-27 | Discharge: 2022-12-27 | Payer: MEDICARE

## 2023-01-01 ENCOUNTER — Encounter: Admit: 2023-01-01 | Discharge: 2023-01-01 | Payer: MEDICARE

## 2023-01-03 ENCOUNTER — Encounter: Admit: 2023-01-03 | Discharge: 2023-01-03 | Payer: MEDICARE

## 2023-01-06 ENCOUNTER — Encounter: Admit: 2023-01-06 | Discharge: 2023-01-06 | Payer: MEDICARE

## 2023-01-07 ENCOUNTER — Encounter: Admit: 2023-01-07 | Discharge: 2023-01-07 | Payer: MEDICARE

## 2023-01-09 ENCOUNTER — Encounter: Admit: 2023-01-09 | Discharge: 2023-01-09 | Payer: MEDICARE

## 2023-01-09 MED ORDER — FUROSEMIDE 20 MG PO TAB
40 mg | ORAL_TABLET | Freq: Every morning | ORAL | 3 refills | 90.00000 days | Status: AC
Start: 2023-01-09 — End: ?

## 2023-01-11 ENCOUNTER — Encounter: Admit: 2023-01-11 | Discharge: 2023-01-11 | Payer: MEDICARE

## 2023-01-14 ENCOUNTER — Encounter: Admit: 2023-01-14 | Discharge: 2023-01-14 | Payer: MEDICARE

## 2023-01-15 ENCOUNTER — Encounter: Admit: 2023-01-15 | Discharge: 2023-01-15 | Payer: MEDICARE

## 2023-01-18 ENCOUNTER — Encounter: Admit: 2023-01-18 | Discharge: 2023-01-18 | Payer: MEDICARE

## 2023-01-18 ENCOUNTER — Ambulatory Visit: Admit: 2023-01-18 | Discharge: 2023-01-18 | Payer: MEDICARE

## 2023-01-18 DIAGNOSIS — N1832 Stage 3b chronic kidney disease (HCC): Secondary | ICD-10-CM

## 2023-01-18 DIAGNOSIS — I4891 Unspecified atrial fibrillation: Secondary | ICD-10-CM

## 2023-01-18 DIAGNOSIS — I495 Sick sinus syndrome: Secondary | ICD-10-CM

## 2023-01-18 DIAGNOSIS — E781 Pure hyperglyceridemia: Secondary | ICD-10-CM

## 2023-01-18 DIAGNOSIS — G4730 Sleep apnea, unspecified: Secondary | ICD-10-CM

## 2023-01-18 DIAGNOSIS — I10 Essential (primary) hypertension: Secondary | ICD-10-CM

## 2023-01-18 DIAGNOSIS — I509 Heart failure, unspecified: Secondary | ICD-10-CM

## 2023-01-18 DIAGNOSIS — E119 Type 2 diabetes mellitus without complications: Secondary | ICD-10-CM

## 2023-01-18 DIAGNOSIS — Z9229 Personal history of other drug therapy: Secondary | ICD-10-CM

## 2023-01-18 DIAGNOSIS — N289 Disorder of kidney and ureter, unspecified: Secondary | ICD-10-CM

## 2023-01-18 DIAGNOSIS — I2510 Atherosclerotic heart disease of native coronary artery without angina pectoris: Secondary | ICD-10-CM

## 2023-01-18 DIAGNOSIS — N184 Chronic kidney disease, stage 4 (severe): Secondary | ICD-10-CM

## 2023-01-18 DIAGNOSIS — E1122 Type 2 diabetes mellitus with diabetic chronic kidney disease: Secondary | ICD-10-CM

## 2023-01-18 DIAGNOSIS — E7800 Pure hypercholesterolemia, unspecified: Secondary | ICD-10-CM

## 2023-01-18 DIAGNOSIS — E669 Obesity, unspecified: Secondary | ICD-10-CM

## 2023-01-18 LAB — BASIC METABOLIC PANEL
ANION GAP: 9 (ref 3–12)
BLD UREA NITROGEN: 59 mg/dL — ABNORMAL HIGH (ref 7–25)
CALCIUM: 9.3 mg/dL (ref 8.5–10.6)
CO2: 22 MMOL/L (ref 21–30)
CREATININE: 3.3 mg/dL — ABNORMAL HIGH (ref 0.4–1.24)
EGFR: 18 mL/min — ABNORMAL LOW (ref >60)
GLUCOSE,PANEL: 111 mg/dL — ABNORMAL HIGH (ref 70–100)
POTASSIUM: 4.6 MMOL/L (ref 3.5–5.1)
SODIUM: 145 MMOL/L (ref 137–147)

## 2023-01-18 LAB — 25-OH VITAMIN D (D2 + D3): VITAMIN D (25-OH) TOTAL: >99 ng/mL — ABNORMAL HIGH (ref 30–80)

## 2023-01-18 NOTE — Progress Notes
CKD Education NP Visit        Doran Durand, MD  [address]  [place],  [place] [address]      01/18/23  Visit start time: 1025  Visit end time:  1200      LESTON HOLTHUS  [id number]  Jul 31, 1945      CC:  CKD Stage 4, here for CKD education        Dear Dr. Philomena Course,    I had the pleasure of seeing Mr. Ernsberger along with his wife and daughter [name]) who are serving as a support for the patient today for CKD Education.  Thank you for the referral of this patient.          History of Present Illness:    CRAIG WESTERMANN is a 77 y.o. male who as you know has CKD stage 4 r/t PMH of ADPKD and DM.    Subjective:  Pt. reports feeling fatigued and having somewhat lower appetite.  He denies any nausea or vomiting.   He reports BPs are checked at home and are normal.  FSBS are checked at home and he reports they have been good.  He follows with endocrinology here at [facility].  The patient reports to be able to obtain and take medications as prescribed.        Comprehensive ROS:  General:  As above.  HEENT:  Negative  Chest/CVS: Negative  ABD: No nausea, vomiting, diarrhea, constipation, or abdominal pain.  Denies any changes in taste of food.  No metallic taste in the mouth.  No loss of appetite.  GU: Negative  EXT: Negative  M/S: Negative  SKIN: Negative  CNS: Negative  Hematology: Negative      A review of allergies, PMH, and current medications to follow    Allergies   Allergen Reactions    Ciprofloxacin RASH    Lisinopril SEE COMMENTS     hyperkalemia    Cefuroxime HIVES    Clindamycin RASH    Plavix [Clopidogrel] RASH    Sulfa (Sulfonamide Antibiotics) HIVES    Quinine SEE COMMENTS     Allergy recorded in SMS: QUININE       Past Medical History:   Diagnosis Date    Atrial fibrillation (HCC)     CAD (coronary artery disease) 09/30/2006    CHF (congestive heart failure) (HCC) 2010    Coronary artery disease (CAD)     s/p PCI LAD    HX: anticoagulation     Baby aspirin    Hypercholesterolemia     Hypertension Hypertriglyceridemia     Kidney disease     Obesity     Sleep apnea     SSS (sick sinus syndrome) (HCC)     dual chamber PPM  / [employer]tr[employer]    Type II diabetes mellitus (HCC)        Surgical History:   Procedure Laterality Date    PACEMAKER PLACEMENT  09/30/2006    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    Performed by Kathreen Cornfield, MD at [facility] EP LAB    Removal Permanent Pacemaker N/A 01/17/2016    Performed by Kathreen Cornfield, MD at [facility] EP LAB    BURR HOLE      CARDIAC CATHERIZATION      CORONARY STENT PLACEMENT      HX APPENDECTOMY         Family History   Problem Relation Name Age of Onset    Asthma Father  Jomarie Longs     Cardiovascular Father Jomarie Longs     Hypertension Mother Malena Catholic     High Cholesterol Mother Lucille        Social History     Socioeconomic History    Marital status: Married   Occupational History     Employer: [employer]     Comment: retired   Tobacco Use    Smoking status: Former     Current packs/day: 0.00     Average packs/day: 1 pack/day for 4.0 years (4.0 ttl pk-yrs)     Types: Cigarettes     Start date: 03/26/1962     Quit date: 03/26/1966     Years since quitting: 56.8    Smokeless tobacco: Never   Substance and Sexual Activity    Alcohol use: No     Comment: rarely    Drug use: Never    Sexual activity: Never         Objective:        Laboratory studies: We reviewed most recent lab results dated 12/18/22 and ranging back to 12/2003.  We discussed the importance of looking at trends.  As of the most recent labs on 12/18/22 creatinine is 2.71 mg/dL with a eGFR of 16.1 mL/min.  According to MDRD, this is stage 4 of chronic kidney disease.  At this level of kidney function and potential for further decline, pt. is likely to need intervention soon.     Physical Exam:  Vitals:    Vitals:    01/18/23 1017   BP: (P) 115/58   BP Source: (P) Arm, Right Upper   Pulse: (P) 80   PainSc: Zero   Weight: 99.4 kg (219 lb 3.2 oz)     Body mass index is 31.45 kg/m?Marland Kitchen  General: A+Ox3, calm, conversant, and pleasant. In no acute distress.  HEENT: Normocephalic, atraumatic. No scleral icterus.    Neck:  Supple, Normal ROM, no JVD   Lungs: CTA upper and lower lobes bilat   Abdomen:  Soft, N/D, nontender   Heart: RRR, S1, S2, without carotid bruit or murmur   Neuro:  No confusion, answers questions appropriately.  No tremors  M/S: Normal ROM and strength.    Ext: 2+ edema LLE and 1+ edema RLE  Psych: Mood stable with normal affect.         Current Outpatient Medications:     allopurinol (ZYLOPRIM) 100 mg tablet, Take one tablet by mouth twice daily., Disp: , Rfl:     atorvastatin (LIPITOR) 40 mg tablet, Take one tablet by mouth daily., Disp: 90 tablet, Rfl: 3    calcium carbonate (TUMS) 500 mg (200 mg elemental calcium) chewable tablet, Chew one tablet by mouth as Needed. Indications: heartburn, Disp: , Rfl:     carvediloL (COREG) 12.5 mg tablet, Take one tablet by mouth twice daily with meals. Take 1/4 tablet twice per day, Disp: , Rfl:     docusate (COLACE) 100 mg capsule, Take one capsule by mouth daily., Disp: , Rfl:     empagliflozin (JARDIANCE) 10 mg tablet, Take one tablet by mouth daily., Disp: 90 tablet, Rfl: 3    FREESTYLE LITE STRIPS test strip, Use one strip as directed before meals and at bedtime., Disp: 300 strip, Rfl: 3    furosemide (LASIX) 20 mg tablet, Take two tablets by mouth every morning., Disp: 180 tablet, Rfl: 3    gabapentin (NEURONTIN) 100 mg capsule, Take one capsule by mouth at bedtime daily., Disp: , Rfl:  insulin degludec (TRESIBA FLEXTOUCH U-100) 100 unit/mL (3 mL) subcutaneous PEN, Lower Tresiba to 20 units, if your morning readings are under 110 you lower another 2 units, until most morning readings are 110-130, Disp: 20 mL, Rfl: 1    levothyroxine (SYNTHROID) 75 mcg tablet, Take one tablet by mouth daily., Disp: , Rfl:     nitroglycerin (NITROSTAT) 0.4 mg tablet, Place 1 tablet under tongue every 5 minutes as needed for Chest Pain., Disp: 25 tablet, Rfl: 1    pen needle, diabetic (RELION PEN NEEDLES) 32 gauge x 5/32 pen needle, Use one each as directed before meals and at bedtime. Use with insulin injections., Disp: 300 each, Rfl: 2    pramipexole (MIRAPEX) 1.5 mg PO Tab, Take one tablet by mouth at bedtime daily., Disp: , Rfl:     semaglutide (OZEMPIC) 1 mg/dose (4 mg/3 mL) injection PEN, Inject 1 mg under the skin every 7 days., Disp: 9 mL, Rfl: 2    traMADoL (ULTRAM) 50 mg tablet, Take one tablet by mouth as Needed., Disp: , Rfl:     trospium (SANCTURA) 20 mg tablet, Take one tablet by mouth twice daily., Disp: , Rfl:          Assessment and Plan:    Impression:  CKD Stage 4 likely r/t ADPKD and DM    Plan:  90 minute visit with greater than 90% of time spent counseling and education as follows.    Education:  We had a lengthy discussion regarding statistics related to CKD, causes of CKD, how the kidneys work, reasons why they stop working, and what happens when they fail.  We discussed important measures to take in order to slow the progression of CKD, including how to manage health conditions such as high blood pressure, heart disease, HLD, and/or diabetes to help maintain current kidney function along with the importance of exercise.  We discussed diet choices (depending on labs and current stage of CKD). A pre-dialysis diet is low in protein, phosphorus, sodium, and potassium.  In contrast, a hemodialysis diet requires more protein than predialysis and adequate calories to maintain weight, a low potassium, phosphorus, and sodium diet remain.  A low fluid diet (depending on UOP) is started.  Link given to explore further.  We reviewed a low sodium diet with a goal of < 2000 mg sodium daily.  Handout:  NKDEP How to read a food label, Tips for people with Chronic Kidney Disease (CKD) given to pt.  Discussed importance of maintaining adequate hydration and to notify you or go to the ER for evaluation if he is losing hydration through vomiting or diarrhea.    Medication review with what to take, how they work, importance of keeping a current med list at all times, what to avoid.  Discussed medications and herbal supplements that are initiated by another healthcare provider or self for (OTC medicines) should be reviewed with pharmacist and/or nephrology team to be sure that it is not harmful to the kidneys.  Handout Avoiding NSAIDs and other potential nephrotoxic agents given to pt.  Discussion of signs and symptoms of kidney failure (uremia), management of symptoms, and when to contact the nephrology team. Handout:  NKF: About Chronic Kidney Disease:  A Guide for Patients given to pt.  Discussed importance of frequent follow up when nearing or in CKD V.   Pt. is to keep follow up visit with you and to call sooner if new symptoms develop.    Discussed renal replacement options and  what to expect if kidney function declines:    Discussed in detail the advantages and disadvantages of kidney transplant (living donor and deceased donor), hemodialysis (in-center and at home), and peritoneal dialysis.    We also discussed the right to choose not to get a kidney transplant or dialysis and what support is available in that instance.  We discussed advanced directives and how to complete so that pt. wishes for health care and treatment will be followed.  Discussed access options for hemodialysis and peritoneal dialysis.  Discussed need for a vascular access for hemodialysis that can take up to 12 weeks to heal and develop before it is ready to use for dialysis treatment.  Discussed members of the nephrology team (patient, family, nephrologist, nurse practitioner, surgeon, nurse, dietician, social worker, and technician).  Also discussed that pt. should keep PCP ( Dr. Herschell Dimes ) informed of any new s/s that may arise.  Review of test results and trends and what each result means was discussed.                       We discussed eGFR and stages of CKD.  We discussed where patient is at and how long that we know of them being at this stage.                        We discussed erythropoietin and anemia in the setting of CKD.  We reviewed the possible need for ESA and/or IV iron infusions in the future if the iron stores do not remain at goal in the setting of low hemoglobin and hematocrit.                     We discussed calcium/phosphorus, and the role of vitamin D.   Mr. Penta is not taking oral vitamin D or limiting PO4 in the diet at this time.  PO4 last check was wnl.  PO4 finder in foods handout reviewed and given to pt.                     We discussed potassium and how it should consumed less when the kidneys are not working to get rid of it from the body.  Discussed that patients must decide what works for them and their family as what works for each patient, how it affects family and social life, work and finances, and patients mental health all vary from person to person. Handout:  NKF:  Coping  Managing Your Kidney Disease was made available to pt. via e-brochure.       Mr. Charon and his family/support people were active in the discussion of patient lab trends, asking questions to gain knowledge, and taking handouts for further review.  They were also interested in the online kidney school link for access to further review.  www.[email or web address]        Mr. Erroll P Lyster was able to verbalize the following:         1.  Blood pressure should be checked daily and recorded.  The goal is to maintain stable levels at or below 130/80.         2.  Specific diet changes to lower blood pressure including low sodium diet.           3.  That a vascular access is a special vessel created by the surgeon for nurses/technicians to use for hemodialysis and that this takes  up to 12 weeks to heal and mature before it can be accessed.  That a peritoneal dialysis catheter is placed in the abdomen by the surgeon and is not used for immediate dialysis.  Mr. Elbers also verbalized that a hemodialysis catheter [facility] CVC) is placed for emergent start dialysis if there is no mature access ready for use at the time dialysis start is needed.   Mr. Eilts understands the risks of infection with these are high and that he would like to avoid them.          4.  When kidneys fail, the body can fill with extra fluid and waste productions and this is a condition called uremia.  This can be a slow process and noticeable to others around daily before noticeable to patient.         5.  Able to verbalize current stage of CKD.         6.  Able to verbalize importance of looking at all medication labels and avoiding NSAIDs and other medications need to be checked to see if they are kidney friendly.         7.  Mr. Moron is interested most in pursuing a home option, but is unsure if PD would be a good option for him with his DM that is currently under pretty good control.  He plans to talk more with endocrinology to see what changes may need to happen with his DM management in the setting of PD if needed.    No referrals were placed today as his eGFR is > 20 at this time.    Inda Coke, APRN, NP-C  Nephrology Nurse Practitioner

## 2023-01-20 ENCOUNTER — Encounter: Admit: 2023-01-20 | Discharge: 2023-01-20 | Payer: MEDICARE

## 2023-01-22 ENCOUNTER — Encounter: Admit: 2023-01-22 | Discharge: 2023-01-22 | Payer: MEDICARE

## 2023-01-23 ENCOUNTER — Encounter: Admit: 2023-01-23 | Discharge: 2023-01-23 | Payer: MEDICARE

## 2023-01-24 ENCOUNTER — Encounter: Admit: 2023-01-24 | Discharge: 2023-01-24 | Payer: MEDICARE

## 2023-01-29 ENCOUNTER — Encounter: Admit: 2023-01-29 | Discharge: 2023-01-29 | Payer: MEDICARE

## 2023-01-29 DIAGNOSIS — N184 Chronic kidney disease, stage 4 (severe): Secondary | ICD-10-CM

## 2023-01-30 ENCOUNTER — Encounter: Admit: 2023-01-30 | Discharge: 2023-01-30 | Payer: MEDICARE

## 2023-01-30 DIAGNOSIS — E1165 Type 2 diabetes mellitus with hyperglycemia: Secondary | ICD-10-CM

## 2023-01-30 MED ORDER — OZEMPIC 1 MG/DOSE (4 MG/3 ML) SC PNIJ
1 mg | SUBCUTANEOUS | 2 refills | Status: AC
Start: 2023-01-30 — End: ?

## 2023-01-30 NOTE — Telephone Encounter
RN spoke with patient spouse   Pt needs refill on Ozempic  Pt get MAP program Ozempic  RN spoke with [facility], just need new script  RN sent to MAP to get processed

## 2023-01-30 NOTE — Telephone Encounter
RN sent in new script and pt notified

## 2023-01-31 ENCOUNTER — Encounter: Admit: 2023-01-31 | Discharge: 2023-01-31 | Payer: MEDICARE

## 2023-01-31 LAB — BASIC METABOLIC PANEL
ANION GAP: 12
BLD UREA NITROGEN: 70 — ABNORMAL HIGH
BUN/CREATININE RATIO: 20
CALCIUM: 8.9
CHLORIDE: 115 — ABNORMAL HIGH
CHOLESTEROL: 84
CO2: 16 — ABNORMAL LOW
CREATININE: 3.5 — ABNORMAL HIGH
GFR ESTIMATED: 16 — ABNORMAL HIGH
GLUCOSE,PANEL: 115
HDL: 23
LDL: 39
POTASSIUM: 4.6
PSA SCREEN: 0.2
SODIUM: 143
TRIGLYCERIDES: 114
TSH: 2.8
VLDL: 23

## 2023-02-01 ENCOUNTER — Encounter: Admit: 2023-02-01 | Discharge: 2023-02-01 | Payer: MEDICARE

## 2023-02-01 DIAGNOSIS — N184 Chronic kidney disease, stage 4 (severe): Secondary | ICD-10-CM

## 2023-02-01 DIAGNOSIS — Q613 Polycystic kidney, unspecified: Secondary | ICD-10-CM

## 2023-02-01 DIAGNOSIS — N179 Acute kidney failure, unspecified: Secondary | ICD-10-CM

## 2023-02-01 DIAGNOSIS — N189 Chronic kidney disease, unspecified: Secondary | ICD-10-CM

## 2023-02-01 DIAGNOSIS — N3000 Acute cystitis without hematuria: Secondary | ICD-10-CM

## 2023-02-01 MED ORDER — FOSFOMYCIN TROMETHAMINE 3 GRAM PO PACK
3 g | Freq: Once | ORAL | 0 refills | Status: DC
Start: 2023-02-01 — End: 2023-02-02

## 2023-02-01 MED ORDER — CARVEDILOL 6.25 MG PO TAB
12.5 mg | Freq: Two times a day (BID) | ORAL | 0 refills | Status: DC
Start: 2023-02-01 — End: 2023-02-03

## 2023-02-01 MED ORDER — OXYBUTYNIN CHLORIDE 5 MG PO TR24
5 mg | Freq: Every day | ORAL | 0 refills | Status: DC
Start: 2023-02-01 — End: 2023-02-08
  Administered 2023-02-02 – 2023-02-07 (×6): 5 mg via ORAL

## 2023-02-01 MED ORDER — GABAPENTIN 100 MG PO CAP
100 mg | Freq: Every evening | ORAL | 0 refills | Status: DC
Start: 2023-02-01 — End: 2023-02-08
  Administered 2023-02-03 – 2023-02-07 (×5): 100 mg via ORAL

## 2023-02-01 MED ORDER — MELATONIN 5 MG PO TAB
5 mg | Freq: Every evening | ORAL | 0 refills | Status: DC | PRN
Start: 2023-02-01 — End: 2023-02-08

## 2023-02-01 MED ORDER — FUROSEMIDE 40 MG PO TAB
40 mg | Freq: Every morning | ORAL | 0 refills | Status: DC
Start: 2023-02-01 — End: 2023-02-03

## 2023-02-01 MED ORDER — EMPAGLIFLOZIN 10 MG PO TAB
10 mg | Freq: Every day | ORAL | 0 refills | Status: DC
Start: 2023-02-01 — End: 2023-02-03
  Administered 2023-02-02: 15:00:00 10 mg via ORAL

## 2023-02-01 MED ORDER — ONDANSETRON 4 MG PO TBDI
4 mg | ORAL | 0 refills | Status: DC | PRN
Start: 2023-02-01 — End: 2023-02-08

## 2023-02-01 MED ORDER — HEPARIN, PORCINE (PF) 5,000 UNIT/0.5 ML IJ SYRG
5000 [IU] | SUBCUTANEOUS | 0 refills | Status: DC
Start: 2023-02-01 — End: 2023-02-08
  Administered 2023-02-02 – 2023-02-07 (×16): 5000 [IU] via SUBCUTANEOUS

## 2023-02-01 MED ORDER — POLYETHYLENE GLYCOL 3350 17 GRAM PO PWPK
1 | Freq: Every day | ORAL | 0 refills | Status: DC | PRN
Start: 2023-02-01 — End: 2023-02-08

## 2023-02-01 MED ORDER — LEVOTHYROXINE 75 MCG PO TAB
75 ug | Freq: Every day | ORAL | 0 refills | Status: DC
Start: 2023-02-01 — End: 2023-02-08
  Administered 2023-02-02 – 2023-02-07 (×6): 75 ug via ORAL

## 2023-02-01 MED ORDER — IMS MIXTURE TEMPLATE
1.5 mg | Freq: Every evening | ORAL | 0 refills | Status: DC
Start: 2023-02-01 — End: 2023-02-08
  Administered 2023-02-03 – 2023-02-07 (×10): 1.5 mg via ORAL

## 2023-02-01 MED ORDER — NITROGLYCERIN 0.4 MG SL SUBL
.4 mg | SUBLINGUAL | 0 refills | Status: DC | PRN
Start: 2023-02-01 — End: 2023-02-08

## 2023-02-01 MED ORDER — ATORVASTATIN 40 MG PO TAB
40 mg | Freq: Every day | ORAL | 0 refills | Status: DC
Start: 2023-02-01 — End: 2023-02-08
  Administered 2023-02-02 – 2023-02-07 (×6): 40 mg via ORAL

## 2023-02-01 MED ORDER — ONDANSETRON HCL (PF) 4 MG/2 ML IJ SOLN
4 mg | INTRAVENOUS | 0 refills | Status: DC | PRN
Start: 2023-02-01 — End: 2023-02-08

## 2023-02-01 MED ORDER — ALLOPURINOL 100 MG PO TAB
100 mg | Freq: Two times a day (BID) | ORAL | 0 refills | Status: DC
Start: 2023-02-01 — End: 2023-02-08
  Administered 2023-02-02 – 2023-02-07 (×11): 100 mg via ORAL

## 2023-02-01 MED ORDER — SENNOSIDES-DOCUSATE SODIUM 8.6-50 MG PO TAB
1 | Freq: Every day | ORAL | 0 refills | Status: DC | PRN
Start: 2023-02-01 — End: 2023-02-08

## 2023-02-01 NOTE — ED Notes
 Pt had ointment applied to inner legs and inner groin.

## 2023-02-01 NOTE — ED Notes
 Pt chucks pads changed and pt gown changed. Pt placed on bedpan

## 2023-02-02 ENCOUNTER — Inpatient Hospital Stay: Admit: 2023-02-02 | Discharge: 2023-02-02 | Payer: MEDICARE

## 2023-02-02 LAB — COMPREHENSIVE METABOLIC PANEL
ALBUMIN: 2.8 g/dL — ABNORMAL LOW (ref 3.5–5.0)
ALK PHOSPHATASE: 58 U/L (ref 25–110)
ALT: 12 U/L (ref 7–56)
ALT: 10 U/L — ABNORMAL LOW (ref 7–56)
ANION GAP: 10 10*3/uL (ref 3–12)
AST: 11 U/L — AB (ref 7–40)
AST: 10 U/L (ref 7–40)
BLD UREA NITROGEN: 76 mg/dL — ABNORMAL HIGH (ref 7–25)
CHLORIDE: 118 MMOL/L — ABNORMAL HIGH (ref 98–110)
CO2: 19 MMOL/L — ABNORMAL LOW (ref 21–30)
CO2: 17 MMOL/L — ABNORMAL LOW (ref 21–30)
EGFR: 18 mL/min — ABNORMAL LOW (ref >60)
GLUCOSE,PANEL: 96 mg/dL (ref >60)
POTASSIUM: 4.1 MMOL/L (ref 3.5–5.1)
TOTAL BILIRUBIN: 0.4 mg/dL — ABNORMAL LOW (ref 0.2–1.3)
TOTAL PROTEIN: 6.1 g/dL — ABNORMAL HIGH (ref 6.0–8.0)

## 2023-02-02 LAB — 2D + DOPPLER ECHO
AORTIC VALVE STROKE VOLUME INDEX: 48 % (ref 55–71)
ASCENDING AORTA: 4.3 cm (ref 33–48)
AV INDEX (NATIVE): 0.4 MMOL/L
AV PEAK VELOCITY: 2.1 m/s (ref 0.2–1.3)
BSA: 2.1 m2
DOP CALC LVOT AREA: 4.5 cm2
DOP CALC LVOT DIAMETER: 2.4 cm (ref 3.5–5.1)
DOP CALC LVOT PEAK VEL VTI: 22 cm (ref 98–110)
DOP CALC LVOT PEAK VEL: 1 m/s
DOP CALC LVOT STROKE VOLUME: 103 cm3
E WAVE DECELARTION TIME: 195 ms (ref 7–40)
E/A RATIO: 1.1
EJECTION FRACTION: 59 %
FRACTIONAL SHORTENING: 34 % (ref 28–44)
INTERVENTRICULAR SEPTUM: 0.9 cm (ref 0.6–1)
LATERAL E/E' RATIO: 7.8
LEFT ATRIUM INDEX: 38 mL/m2 (ref 16–34)
LEFT ATRIUM SIZE: 4.8 cm (ref 3–4)
LEFT ATRIUM VOLUME: 84 mL (ref 18–58)
LEFT INTERNAL DIMENSION IN SYSTOLE: 3 cm — ABNORMAL LOW (ref 2.5–4)
LEFT VENTRICLE DIASTOLIC VOLUME INDEX: 78 mL/m2 (ref 34–74)
LEFT VENTRICLE DIASTOLIC VOLUME: 171 mL (ref 62–150)
LEFT VENTRICLE MASS INDEX: 63 g/m2 (ref 49–115)
LEFT VENTRICLE SYSTOLIC VOLUME INDEX: 34 mL/m2 (ref 11–31)
LEFT VENTRICLE SYSTOLIC VOLUME: 74 mL (ref 21–61)
LEFT VENTRICULAR INTERNAL DIMENSION IN DIASTOLE: 4.6 cm (ref 4.2–5.8)
LEFT VENTRICULAR MASS: 138 g (ref 88–224)
MEDIAL E/E' RATIO: 14
MV PEAK A VEL: 0.8 m/s (ref 21–30)
MV PEAK E VEL PW: 1 m/s (ref 7–56)
POSTERIOR WALL: 0.9 cm (ref 0.6–1)
RA PRESSURE: 15 mmHg (ref 36–50)
RELATIVE WALL THICKNESS: 0.3 (ref <=0.42)
RIGHT ATRIAL AREA: 17 cm2 (ref <18)
RIGHT HEART SYSTOLIC MMODE TAPSE: 2.5 cm (ref 3–>1.7)
RIGHT HEART SYSTOLIC TDI S': 0.1 m/s
RIGHT VENTRICULAR BASAL DIAMETER: 3.3 cm (ref 2.5–4.1)
RIGHT VENTRICULAR MID DIAMETER: 2.2 cm (ref >60)
RV SYSTOLIC PRESSURE: 25
SIMPSONS BIPLANE EF: 56 %
SINUS: 4.3 cm — ABNORMAL LOW (ref 2.8–4)
STJ: 3.5 cm (ref 2.3–3.5)
TDI LATERAL E': 0.1 m/s — ABNORMAL HIGH (ref 7–25)
TDI MEDIAL E': 0 m/s (ref 0.4–1.24)
TR PEAK VELOCITY: 2.5 m/s
TV REST PULMONARY ARTERY PRESSURE: 40 mmHg

## 2023-02-02 LAB — URINALYSIS DIPSTICK REFLEX TO CULTURE
URINE ASCORBIC ACID, UA: NEGATIVE 10*3/uL — ABNORMAL LOW (ref 1.0–4.8)
URINE BILE: NEGATIVE % — ABNORMAL LOW (ref 24–44)

## 2023-02-02 LAB — CBC AND DIFF
ABSOLUTE BASO COUNT: 0 10*3/uL (ref 0–0.20)
ABSOLUTE BASO COUNT: 0 10*3/uL (ref 0–0.20)
ABSOLUTE EOS COUNT: 0.1 10*3/uL (ref 0–0.45)
ABSOLUTE MONO COUNT: 0.4 10*3/uL (ref 0–0.80)
HEMATOCRIT: 24 % — ABNORMAL LOW (ref 40–50)
HEMOGLOBIN: 8 g/dL — ABNORMAL LOW (ref 13.5–16.5)
MDW (MONOCYTE DISTRIBUTION WIDTH): 18 (ref <20.7)
MPV: 6.8 FL — ABNORMAL LOW (ref 7–11)
PLATELET COUNT: 186 10*3/uL — ABNORMAL HIGH (ref 150–400)
RBC COUNT: 3.4 M/UL — ABNORMAL LOW (ref 4.4–5.5)
RBC COUNT: 2.9 M/UL — ABNORMAL LOW (ref 4.4–5.5)
WBC COUNT: 6.5 10*3/uL (ref 4.5–11.0)
WBC COUNT: 4.9 10*3/uL — ABNORMAL LOW (ref 4.5–11.0)

## 2023-02-02 LAB — URINALYSIS MICROSCOPIC REFLEX TO CULTURE

## 2023-02-02 LAB — KC ED MAIN ECG TRIAGE ONLY
P AXIS: 38 degrees
P-R INTERVAL: 268 ms
Q-T INTERVAL: 404 ms (ref 0.6–1.6)
QRS DURATION: 82 ms
QTC CALCULATION (BAZETT): 445 ms
R AXIS: -19 degrees
T AXIS: 0 degrees
VENTRICULAR RATE: 73 {beats}/min

## 2023-02-02 LAB — UREA NITROGEN-URINE RANDOM: UR UREA NIT, RAN: 313 mg/dL

## 2023-02-02 LAB — TOTAL PROTEIN-URINE RANDOM: UR TOTAL PROTEIN,RAN: 34 mg/dL

## 2023-02-02 LAB — CULTURE-URINE W/SENSITIVITY: CULTURE RESULT: <10

## 2023-02-02 LAB — PHOSPHORUS: PHOSPHORUS: 4.1 mg/dL — ABNORMAL LOW (ref 2.0–4.5)

## 2023-02-02 LAB — POC GLUCOSE: POC GLUCOSE: 114 mg/dL — ABNORMAL HIGH (ref 70–100)

## 2023-02-02 LAB — MAGNESIUM: MAGNESIUM: 2 mg/dL — ABNORMAL LOW (ref 1.6–2.6)

## 2023-02-02 LAB — PROSTATE SPECIFIC ANTIGEN (MONITORING): PROSTATIC SPEC AG: 0.2 ng/mL (ref <6.01)

## 2023-02-02 LAB — CREATININE-URINE RANDOM: UR CREATININE, RAN: 36 mg/dL

## 2023-02-02 MED ORDER — PIPERACILLIN-TAZOBACTAM 4.5 G IVP
4.5 g | INTRAVENOUS | 0 refills | Status: DC
Start: 2023-02-02 — End: 2023-02-03
  Administered 2023-02-02 – 2023-02-03 (×4): 4.5 g via INTRAVENOUS

## 2023-02-02 MED ORDER — ACETAMINOPHEN 325 MG PO TAB
650 mg | ORAL | 0 refills | Status: DC | PRN
Start: 2023-02-02 — End: 2023-02-08

## 2023-02-02 MED ORDER — TRAMADOL 50 MG PO TAB
50 mg | Freq: Every day | ORAL | 0 refills | Status: DC | PRN
Start: 2023-02-02 — End: 2023-02-08

## 2023-02-02 MED ORDER — SODIUM CHLORIDE 0.9 % IJ SOLN
10 mL | Freq: Once | INTRAVENOUS | 0 refills | Status: CP
Start: 2023-02-02 — End: ?
  Administered 2023-02-02: 21:00:00 10 mL via INTRAVENOUS

## 2023-02-02 MED ORDER — LIDOCAINE HCL 2 % MM JELP
Freq: Once | TOPICAL | 0 refills | Status: CP
Start: 2023-02-02 — End: ?
  Administered 2023-02-03: 02:00:00 20.0000 mL via TOPICAL

## 2023-02-02 MED ORDER — MEROPENEM IVPB
2 g | INTRAVENOUS | 0 refills | Status: DC
Start: 2023-02-02 — End: 2023-02-02

## 2023-02-02 MED ORDER — PERFLUTREN LIPID MICROSPHERES 1.1 MG/ML IV SUSP
1-10 mL | Freq: Once | INTRAVENOUS | 0 refills | Status: CP | PRN
Start: 2023-02-02 — End: ?
  Administered 2023-02-02: 21:00:00 2 mL via INTRAVENOUS

## 2023-02-02 MED ORDER — DOXYCYCLINE HYCLATE 100 MG PO TAB
100 mg | Freq: Two times a day (BID) | ORAL | 0 refills | Status: DC
Start: 2023-02-02 — End: 2023-02-08
  Administered 2023-02-02 – 2023-02-07 (×11): 100 mg via ORAL

## 2023-02-02 NOTE — Care Coordination-Inpatient
Med Teaching TBD [phone] will take calls on this patient until 8 am.  Afterwards, please contact Med Private C for any questions or concerns.         Ponciano Ort, MD  AOD  On Voalte ---- 8 pm to 8 am

## 2023-02-02 NOTE — Progress Notes
 RT Adult Assessment Note    NAME:Paul Benjamin             MRN: [id number]             DOB:1946/01/17          AGE: 77 y.o.  ADMISSION DATE: 02/01/2023             DAYS ADMITTED: LOS: 1 day    Additional Comments:  Impressions of the patient: nad on RA, wears cpap at night for OSA  Intervention(s)/outcome(s): IS, CPAP, pulse ox    Vital Signs:  Pulse: 70  RR: 18 PER MINUTE  SpO2: 97 %  O2 Device: None (Room air)  Liter Flow:    O2%: 21 %    Breath Sounds:   All Breath Sounds: Clear (Implies normal);Decreased  Respiratory Effort:   Respiratory WDL: Within Defined Limits  Comments:

## 2023-02-02 NOTE — Case Management (ED)
 Case Management Admission Assessment    NAME:Paul Benjamin                          MRN: [id number]             DOB:1945/11/01          AGE: 77 y.o.  ADMISSION DATE: 02/01/2023             DAYS ADMITTED: LOS: 1 day      Today?s Date: 02/02/2023    Source of Information: Patient, Daughter, Spouse        Plan  Plan: Case Management Assessment, Psychosocial Assessment, Assist PRN with SW/NCM Services    This CM met with pt for assessment on this date.  Provided contact information and explanation of SW/NCM roles.  Reviewed [facility], Preparing for Discharge, and [facility] hand-outs.  Provided opportunity for questions and discussion.    SW spoke with pt regarding dc planning:   Pt confirmed address, insurance, PCP  Pt states that he currently lives with his spouse and is mostly independent with cares. Pt states his spouse assists with cares if he needs assistance.   Pt confirmed his spouse would assist with dc transportation  Pt has shower chair, cane, walker, toilet risers and wc for DME   Pt has hx of HH in 2020 when he had COVID, was through [facility] however cannot remember the name   Pt currently doing iron infusions in [place], [place]   Pt currently having discussions of possible new HD start, however has not had HD up to this point  Pt denies hx of SNF, IPR, LTACH, etc.   CM will continue to follow and assist with dc planning  Pt/family encouraged to contact Case Management team with questions and concerns during hospitalization and until patient is able to transition back to the patient's primary care physician.      Patient Address/Phone  [address] Paul Benjamin  [place] Benjamin [address]  [phone] (home)     Emergency Contact  Extended Emergency Contact Information  Primary Emergency Contact: Paul Benjamin  Address: [address]           [place], [place] [address] [employer]  Home Phone: [phone]  Mobile Phone: [phone]  Relation: Spouse  Secondary Emergency Contact: Paul Benjamin  Address: [address]           [place], [place] [address] [place]  Home Phone: [phone]  Work Phone: [phone]  Relation: Daughter    Systems developer  Does the Patient Need Case Management to Arrange Discharge Transport? (ex: facility, ambulance, wheelchair/stretcher, Medicaid, cab, other): No  Will the Patient Use Family Transport?: Yes  Transportation Name, Phone and Availability #1: Spouse to assist with transportation home    Expected Discharge Date  02/04/2023     Living Situation Prior to Admission  Living Arrangements  Type of Residence: Home, with Home Health or other assistance  Living Arrangements: Spouse/significant other  Financial risk analyst / Tub: Tub/Shower Unit  How many levels in the residence?: 1  Can patient live on one level if needed?: Yes  Does residence have entry and/or inside stairs?: No  Assistance needed prior to admit or anticipated on discharge: Yes  Who provides assistance or could if needed?: Spouse assisting with cares  Are they in good health?: Yes  Can support system provide  24/7 care if needed?: Maybe  Level of Function   Prior level of function: Independent  Cognitive Abilities   Cognitive Abilities: Engages in problem solving and planning, Participates in decision making, Alert and Oriented    Financial Resources  Coverage  Primary Insurance: Medicare  Secondary Insurance: Medicare Supplement  Medication Coverage    Medication Coverage: Medicare Part D  Medicare Part D Plan: [employer]  Have you experienced a noticeable increase in your copay costs recently?: No  Are current medications affordable?: Yes  Do You Use a Co-Pay Card or a Medication Assistance Program to Help Manage Medication Costs?: No  Do You Manage Your Own Medications?: Yes  Source of Income   Source Of Income: Other retirement income, SSI  Financial Assistance Needed?  N/a    Psychosocial Needs  Mental Health  Mental Health History: No  Substance Use History  Substance Use History Screen: No  Other  N/a    Current/Previous Services  PCP  Paul Benjamin, [phone], [phone]  Pharmacy    [facility] [address] [place] [address] [place] [address] [place]  [place] [address]  Phone: [phone] Fax: [phone]    [facility]  [address]  [place] [address]  Phone: [phone] Fax: [phone]    MEDICATION ASSISTANCE [facility]  [address]  [place] [address]  Phone: [phone] Fax: [phone]    Durable Medical Equipment   Durable Medical Equipment at home: Shower Chair, [place], Dan Humphreys, Toilet riser, Wheelchair (manual)  Home Health  Receiving home health: In the past  Agency name: Unknown, was in 2020  Would patient use this agency again?: Yes  Hemodialysis or Peritoneal Dialysis  Undergoing hemodialysis or peritoneal dialysis: No  Tube/Enteral Feeds  Receive tube/enteral feeds: No  Infusion  Receive infusions: In the past  Where: [place], [place]  Infusion company: [facility]  Would patient use this agency again?: Yes  Private Duty  Private duty help used: No  Home and Community Based [employer] and community based services: No  Ryan White  Ryan White: N/A  Hospice  Hospice: No  Outpatient Therapy  PT: No  OT: No  SLP: No  Skilled Nursing Facility/Nursing Home  SNF: No  NH: No  Inpatient Rehab  IPR: No  Long-Term Acute Care Hospital  LTACH: No  Acute Hospital Stay  Acute Hospital Stay: In the past  Was patient's stay within the last 30 days?: No      Lind Covert, LMSW  Work Phone: [phone]  Amie Critchley

## 2023-02-02 NOTE — Care Plan
Problem: Skin Integrity  Goal: Skin integrity intact  Outcome: Goal Ongoing  Goal: Healing of skin (Wound & Incision)  Outcome: Goal Ongoing  Goal: Healing of skin (Pressure Injury)  Outcome: Goal Ongoing     Problem: High Fall Risk  Goal: High Fall Risk  Outcome: Goal Ongoing

## 2023-02-02 NOTE — Consults
 02/02/2023 7:08 AM                                       Renal Consult    Paul Benjamin  Admission Date:  02/01/2023  Seen with his family at the bedside    History     Reason for Consult: Acute kidney injury on chronic kidney disease     HPI: Paul Benjamin is a 77 y.o. male  with past medical history of  diabetes mellitus, hypertension, chronic kidney disease stage IV , atrial fibrillation and coronary artery disease . He was admitted on 02-01-23 due to worsening renal function.                  Past Medical History   Past Medical History:   Diagnosis Date    Atrial fibrillation (HCC)     CAD (coronary artery disease) 09/30/2006    CHF (congestive heart failure) (HCC) 2010    Coronary artery disease (CAD)     s/p PCI LAD    HX: anticoagulation     Baby aspirin    Hypercholesterolemia     Hypertension     Hypertriglyceridemia     Kidney disease     Obesity     Sleep apnea     SSS (sick sinus syndrome) (HCC)     dual chamber PPM  / Medtronic    Type II diabetes mellitus (HCC)        Surgical History:   Procedure Laterality Date    PACEMAKER PLACEMENT  09/30/2006    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    Performed by Kathreen Cornfield, MD at [facility] EP LAB    Removal Permanent Pacemaker N/A 01/17/2016    Performed by Kathreen Cornfield, MD at [facility] EP LAB    BURR HOLE      CARDIAC CATHERIZATION      CORONARY STENT PLACEMENT      HX APPENDECTOMY         Family History  Family History   Problem Relation Name Age of Onset    Asthma Father Jomarie Longs     Cardiovascular Father Jomarie Longs     Hypertension Mother Malena Catholic     High Cholesterol Mother Optician, dispensing         Social History   Social History     Socioeconomic History    Marital status: Married   Occupational History     Employer: [employer]     Comment: retired   Tobacco Use    Smoking status: Former     Current packs/day: 0.00     Average packs/day: 1 pack/day for 4.0 years (4.0 ttl pk-yrs)     Types: Cigarettes     Start date: 03/26/1962     Quit date: 03/26/1966 Years since quitting: 56.8    Smokeless tobacco: Never   Substance and Sexual Activity    Alcohol use: No     Comment: rarely    Drug use: Never    Sexual activity: Never          Medications  MEDSallopurinoL, 100 mg, Oral, BID  atorvastatin, 40 mg, Oral, QDAY  carvediloL, 12.5 mg, Oral, BID w/meals  doxycycline hyclate, 100 mg, Oral, BID  empagliflozin, 10 mg, Oral, QDAY  [Held by Provider] furosemide, 40 mg, Oral, QAM8  gabapentin, 100 mg, Oral, QHS  heparin (porcine), 5,000 Units, Subcutaneous, Q8H  levothyroxine, 75 mcg, Oral, QDAY  oxybutynin XL, 5 mg, Oral, QDAY  pramipexole, 1.5 mg, Oral, QHS     IV MEDS  Prn acetaminophen Q4H PRN, melatonin QHS PRN, nitroglycerin Q5 MIN PRN, ondansetron Q6H PRN **OR** ondansetron (ZOFRAN) IV Q6H PRN, polyethylene glycol 3350 QDAY PRN, sennosides-docusate sodium QDAY PRN, traMADoL QDAY PRN     HOME MEDS  Prior to Admission Medications   Prescriptions Last Dose Informant Patient Reported? Taking?   FREESTYLE LITE STRIPS test strip   No No   Sig: Use one strip as directed before meals and at bedtime.   allopurinol (ZYLOPRIM) 100 mg tablet  Spouse/Partner Yes No   Sig: Take one tablet by mouth twice daily.   atorvastatin (LIPITOR) 40 mg tablet   No No   Sig: Take one tablet by mouth daily.   calcium carbonate (TUMS) 500 mg (200 mg elemental calcium) chewable tablet  Spouse/Partner Yes No   Sig: Chew one tablet by mouth as Needed. Indications: heartburn   carvediloL (COREG) 12.5 mg tablet   Yes No   Sig: Take one tablet by mouth twice daily with meals. Take 1/4 tablet twice per day   docusate (COLACE) 100 mg capsule   Yes No   Sig: Take one capsule by mouth daily.   doxycycline monohydrate (MONODOX) 100 mg capsule   Yes Yes   Sig: Take one capsule by mouth twice daily.   empagliflozin (JARDIANCE) 10 mg tablet   No No   Sig: Take one tablet by mouth daily.   furosemide (LASIX) 20 mg tablet   No No   Sig: Take two tablets by mouth every morning.   gabapentin (NEURONTIN) 100 mg capsule   Yes No   Sig: Take one capsule by mouth at bedtime daily.   insulin degludec (TRESIBA FLEXTOUCH U-100) 100 unit/mL (3 mL) subcutaneous PEN   No No   Sig: Lower Tresiba to 20 units, if your morning readings are under 110 you lower another 2 units, until most morning readings are 110-130. Discard pen 56 days after first use.   levothyroxine (SYNTHROID) 75 mcg tablet  Spouse/Partner Yes No   Sig: Take one tablet by mouth daily.   nitroglycerin (NITROSTAT) 0.4 mg tablet  Spouse/Partner No No   Sig: Place 1 tablet under tongue every 5 minutes as needed for Chest Pain.   pen needle, diabetic (RELION PEN NEEDLES) 32 gauge x 5/32 pen needle   No No   Sig: Use one each as directed before meals and at bedtime. Use with insulin injections.   pramipexole (MIRAPEX) 1.5 mg PO Tab  Spouse/Partner Yes No   Sig: Take one tablet by mouth at bedtime daily.   semaglutide (OZEMPIC) 1 mg/dose (4 mg/3 mL) injection PEN   No No   Sig: Inject 1 mg under the skin every 7 days.   traMADoL (ULTRAM) 50 mg tablet   Yes No   Sig: Take one tablet by mouth as Needed.   trospium (SANCTURA) 20 mg tablet   Yes No   Sig: Take one tablet by mouth twice daily.      Facility-Administered Medications: None          Review of Systems    Review of Systems   Constitutional:  Negative for chills and fever.   HENT:  Negative for hearing loss and tinnitus.    Eyes:  Negative for blurred vision and double vision.   Respiratory:  Negative for cough and hemoptysis.    Cardiovascular:  Negative for chest pain and palpitations.   Gastrointestinal:  Negative for diarrhea and vomiting.   Genitourinary:  Negative for dysuria and hematuria.   Musculoskeletal:  Negative for falls and neck pain.   Skin:  Negative for itching and rash.   Neurological:  Negative for dizziness and seizures.   Endo/Heme/Allergies:  Negative for environmental allergies. Does not bruise/bleed easily.   Psychiatric/Behavioral:  Negative for hallucinations. The patient does not have insomnia.           Physical Exam        Vital Signs: Last Filed In 24 Hours Vital Signs: 24 Hour Range   BP: 95/54 (11/09 0534)  Temp: 37.1 ?C (98.7 ?F) (11/09 0431)  Pulse: 70 (11/09 0431)  Respirations: 18 PER MINUTE (11/09 0431)  SpO2: 99 % (11/09 0431)  O2 Device: None (Room air) (11/09 0431)  Height: 177.8 cm (5' 10) (11/08 2347) BP: (93-157)/(43-87)   Temp:  [36.3 ?C (97.3 ?F)-37.1 ?C (98.7 ?F)]   Pulse:  [70-81]   Respirations:  [16 PER MINUTE-23 PER MINUTE]   SpO2:  [96 %-100 %]   O2 Device: None (Room air)   Intensity Pain Scale (Self Report): 0 (02/02/23 0000)      Vitals:    02/01/23 1743 02/01/23 2347   Weight: 96.2 kg (212 lb) 96.5 kg (212 lb 11.9 oz)       Intake/Output Summary (Last 24 hours) at 02/02/2023 0708  Last data filed at 02/02/2023 0435  Gross per 24 hour   Intake 680 ml   Output --   Net 680 ml        Physical Exam     Gen: Alert and Oriented, No Acute Distress   HEENT: Sclera normal; MMM  CV: no JVD, S1 and S2 normal, no rubs, murmurs or gallops   Pulm: Clear to Auscultation bilateral   GI: BS+ x4, non-tender to palpation  Neuro: Grossly normal, moving all extremities, speech intact  Ext: bilateral lower extremity edema with chronic skin changes  Skin: no rash                        Assessment and Plan     Paul Benjamin is a 77 y.o. 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  He has diabetes mellitus which is well controlled.  He has grade A2 albuminuria  Baseline serum creatinine is around 2.5mg /dl. serum creatinine on presentation was 3.61mg /dl.  Renal ultrasound with bilateral mild hydronephrosis .Mildly distended urinary bladder with diffuse bladder   wall trabeculation/diverticula and layering debris.     Plan  Place a urethral catheter to decompress bladder and kidney.  Request urology consult.  Hold diuretics and empagliflozin       --Metabolic acidosis  This is due to worsening renal function.  If bicarbonate worsens further we will start sodium bicarbonate.    --Hypotension  Blood pressure is borderline low.  Continue to monitor and hold diuretics as above.      --Anemia  Assess iron stores with am labs.    --UTI  cellulitis      Discussed with Dr Wyvonne Lenz, MD  [phone]

## 2023-02-02 NOTE — Case Management (ED)
 Case Management Progress Note    NAME:Paul Benjamin                          MRN: [id number]              DOB:Aug 21, 1945          AGE: 77 y.o.  ADMISSION DATE: 02/01/2023             DAYS ADMITTED: LOS: 1 day      Today's Date: 02/02/2023    PLAN: Anticipate DC home w/ family support once medically stable    Expected Discharge Date: 02/04/2023   Is Patient Medically Stable: No, Please explain: see provider notes for further details  Are there Barriers to Discharge? no    INTERVENTION/DISPOSITION:  Discharge Planning              Discharge Planning: Home Health  Transportation              Does the Patient Need Case Management to Arrange Discharge Transport? (ex: facility, ambulance, wheelchair/stretcher, Medicaid, cab, other): No  Will the Patient Use Family Transport?: Yes  Transportation Name, Phone and Availability #1: Spouse to assist with transportation home  Support              Support: Pt/Family Updates re:POC or DC Plan    *NCM attempted x2 to complete initial assessment. 1st attempt pt had 4 visitors. 2nd attempt, pt off unit for echo.  *Upon entering note, NCM noted that LMSW, H.Smith completed initial assessment.        Info or Referral              Information or Referral to Community Resources: No Needs Identified  Positive SDOH Domains and Potential Barriers     Medication Needs              Medication Needs: No Needs Identified  Financial              Financial: No Needs Identified  Legal              Legal: No Needs Identified  Other              Other/None: No needs identified  Discharge Disposition                   Selected Continued Care - Admitted Since 02/01/2023    No services have been selected for the patient.       Clent Demark, BSN, RN, CCM  Certified RN Case Manager  Med - Acute Leukemia  Phone:  [phone]  Fax:  [phone]

## 2023-02-03 ENCOUNTER — Inpatient Hospital Stay: Admit: 2023-02-03 | Discharge: 2023-02-04 | Payer: MEDICARE

## 2023-02-03 ENCOUNTER — Encounter: Admit: 2023-02-03 | Discharge: 2023-02-03 | Payer: MEDICARE

## 2023-02-03 LAB — COMPREHENSIVE METABOLIC PANEL
~~LOC~~ BKR ALBUMIN: 2.7 g/dL — ABNORMAL LOW (ref 3.5–5.0)
~~LOC~~ BKR ALK PHOSPHATASE: 50 U/L — ABNORMAL HIGH (ref 25–110)
~~LOC~~ BKR ALT: 8 U/L — ABNORMAL LOW (ref 7–56)
~~LOC~~ BKR AST: 8 U/L — ABNORMAL LOW (ref 7–40)
~~LOC~~ BKR BLD UREA NITROGEN: 75 mg/dL — ABNORMAL HIGH (ref 7–25)
~~LOC~~ BKR CALCIUM: 8.7 mg/dL — ABNORMAL LOW (ref 8.5–10.6)
~~LOC~~ BKR CO2: 15 mmol/L — ABNORMAL LOW (ref 21–30)
~~LOC~~ BKR CREATININE: 4.1 mg/dL — ABNORMAL HIGH (ref 0.40–1.24)
~~LOC~~ BKR TOTAL BILIRUBIN: 0.5 mg/dL — ABNORMAL HIGH (ref 0.3–1.2)
~~LOC~~ BKR TOTAL PROTEIN: 5.7 g/dL — ABNORMAL LOW (ref 6.0–8.0)

## 2023-02-03 MED ORDER — SODIUM BICARBONATE 650 MG PO TAB
1300 mg | Freq: Two times a day (BID) | ORAL | 0 refills | Status: DC
Start: 2023-02-03 — End: 2023-02-08
  Administered 2023-02-04 – 2023-02-07 (×8): 1300 mg via ORAL

## 2023-02-03 MED ORDER — DEXTROSE 50 % IN WATER (D50W) IV SYRG
12.5-25 g | INTRAVENOUS | 0 refills | Status: DC | PRN
Start: 2023-02-03 — End: 2023-02-08

## 2023-02-03 MED ORDER — INSULIN ASPART 100 UNIT/ML SC FLEXPEN
0-6 [IU] | Freq: Before meals | SUBCUTANEOUS | 0 refills | Status: DC
Start: 2023-02-03 — End: 2023-02-08
  Administered 2023-02-04: 01:00:00 4 [IU] via SUBCUTANEOUS

## 2023-02-03 MED ORDER — PIPERACILLIN-TAZOBACTAM 4.5 G IVP
4.5 g | Freq: Two times a day (BID) | INTRAVENOUS | 0 refills | Status: DC
Start: 2023-02-03 — End: 2023-02-04
  Administered 2023-02-04: 03:00:00 4.5 g via INTRAVENOUS

## 2023-02-03 MED ORDER — ALBUMIN, HUMAN 25 % IV SOLP
25 g | Freq: Once | INTRAVENOUS | 0 refills | Status: DC
Start: 2023-02-03 — End: 2023-02-03

## 2023-02-03 MED ORDER — FUROSEMIDE 40 MG PO TAB
40 mg | ORAL | 0 refills | Status: DC
Start: 2023-02-03 — End: 2023-02-06
  Administered 2023-02-06: 14:00:00 40 mg via ORAL

## 2023-02-03 MED ORDER — ALBUMIN, HUMAN 25 % IV SOLP
25 g | INTRAVENOUS | 0 refills | Status: CP
Start: 2023-02-03 — End: ?
  Administered 2023-02-03 (×2): 25 g via INTRAVENOUS

## 2023-02-03 MED ORDER — METOPROLOL SUCCINATE 25 MG PO TB24
12.5 mg | Freq: Every day | ORAL | 0 refills | Status: DC
Start: 2023-02-03 — End: 2023-02-08
  Administered 2023-02-04 – 2023-02-07 (×4): 12.5 mg via ORAL

## 2023-02-03 NOTE — Progress Notes
 Internal Medicine - Progress Note      Name:  Paul Benjamin                                             MRN:  [id number]   Admission Date:  02/01/2023                     Assessment/Plan:    Principal Problem:    CKD (chronic kidney disease)    JEFFY KORCH is a 77 y.o. male with a past medical history of congestive heart failure, type 2 diabetes, hypertension, and polycystic kidney disease who presents to the emergency department due to generalized weakness and abnormalities on his recent labs.     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 nephrology; baseline Cr. Appears to be around 2.7, currently 3.4  - Has PKD2 mutation  - PTA lasix 20 mg on MWF and 40 mg TTS  - Urine dirty, urine culture pending  - 11/9 Renal US w/ urinary retention and bilateral pyelo  - Patient received call from Dr. Philomena Course to come to the ED to expedite eval for concerning kidney labs  PLAN:  > CR. 4 today up from 3, will cont. To monitor  > Will try some 25% albumin x2 doses (low albumin and appears dry on exam)  > Follow urine culture  > Cont. empiric Zosyn given documented hives w/ cephalosporins 11/9-current  > Hold lasix   > Oxybutynin 5 mg for bladder issues - Trospium not on formulary  > Maintain on renal diet   > Consulted nephrology and following   -transition from coreg to metoprolol to assist w/ low BP  -given [place] results foley placed on 11/9; will d/w urology for follow up (had one for 3/5, but will ask for expedited eval w/I 2 weeks of discharge  > Unable to start flomax given low BP  > Check urine total protein, urea, and cr levels     #Cellulitis of lower legs - improving  - Present on lower extremity most notably on L side  - Was prescribed doxycyline 100 mg BID 01/29/23  PLAN:  > Continue PTA doxycycline 100 mg BID     #Congestive Heart Failure, diastolic  #CAD  #Sick sinus syndrome  #Hx of Afib post ablation and s/p pacemaker  #HTN  - Follows with [facility] cardiology - Dr. Barry Dienes  - PTA atorvastatin 40 mg, Coreg 12.5 mg BID, jardiance 10 mg, lasix 20mg  daily  - Nitroglycerin 0.4 mg SL SUBL  - Not on AC for afib  PLAN:  > Continue atorvastatin 40 mg, transition coreg to metoprolol low dose given low normal BP and hold jardiance and lasix pending above     #T2DM  - Well controlled per recent outpatient notes  - Jardiance 10 mg  - Degludec 20 u daily  PLAN:  > Hold Jardiance 10 mg  > LDCF     #Hypothyrodism  Plan:  > Levothyroxine 75 mcg     #Restless legs syndrome  Plan:  > Gabapentin 100 mg  > Pramipexole 1.5 mg     #Gout  Plan:  > Allopurinol 100 mg tablet     #  Pain  Plan:  > Tramadol 50 mg     FEN: No IVF, replace eletrolytes as needed  Diet: Renal  DVT proph: SCDs and heparin  Code: Full code  Dispo: Cont. Inpatient care, DC TBD    Cecille Po, MD  Internal Medicine/Hospitalist  Med private-C  Pager [phone]    To reach physician or NP 24/7, search [employer] for Med Private ** First Call.   Voalte is the preferred method of communication, but if pager is needed, the number is [phone].    Total Time Today was 80 minutes in the following activities: Preparing to see the patient, Obtaining and/or reviewing separately obtained history, Performing a medically appropriate examination and/or evaluation, Counseling and educating the patient/family/caregiver, Ordering medications, tests, or procedures, Documenting clinical information in the electronic or other health record, Independently interpreting results (not separately reported) and communicating results to the patient/family/caregiver and Care coordination (not separately reported)    Subjective:      NAD.  No acute events reported overnight, foley with some pain, but overall doing ok.      12 point ROS negative except as mentioned above        Objective:                           Vital Signs: Last Filed                 Vital Signs: 24 Hour Range   BP: 110/49 (11/10 0455)  Temp: 37.1 ?C (98.7 ?F) (11/10 0454)  Pulse: 71 (11/10 0454)  Respirations: 18 PER MINUTE (11/10 0454)  SpO2: 96 % (11/10 0454)  O2%: 21 % (11/09 2310)  O2 Device: None (Room air) (11/10 0454)  Height: 177.8 cm (5' 10) (11/09 0823) BP: (94-110)/(43-50)   Temp:  [36.8 ?C (98.2 ?F)-37.1 ?C (98.7 ?F)]   Pulse:  [70-72]   Respirations:  [16 PER MINUTE-20 PER MINUTE]   SpO2:  [96 %-98 %]   O2%:  [21 %]   O2 Device: None (Room air)   Intensity Pain Scale (Self Report): 3 (02/02/23 0900) Vitals:    02/01/23 1743 02/01/23 2347 02/02/23 0823   Weight: 96.2 kg (212 lb) 96.5 kg (212 lb 11.9 oz) 96.2 kg (212 lb)       Intake/Output Summary:  (Last 24 hours)    Intake/Output Summary (Last 24 hours) at 02/03/2023 0739  Last data filed at 02/03/2023 0502  Gross per 24 hour   Intake 702 ml   Output 1500 ml   Net -798 ml           PHYSICAL EXAMINATION:     Constitutional:       Appearance: Normal appearance.      Comments: Appeared fatigued   HENT:      Head: Normocephalic and atraumatic.   Eyes:      Conjunctiva/sclera: Conjunctivae normal.   Cardiovascular:      Rate and Rhythm: Normal rate.      Comments: Faint 3rd heart sound on auscultation, most notable R upper sternal border  Pulmonary:      Effort: Pulmonary effort is normal.      Breath sounds: Normal breath sounds.   Abdominal:      General: Abdomen is flat.   Genitourinary:     Comments: foley in place  Musculoskeletal:         General: Normal range of motion.      Cervical back: Normal range  of motion.      Right lower leg: 1Trace Edema present.      Left lower leg: Trace Edema present.      Comments: B/l lower extremity mild redness - chronic in calves and thighs    Lab Review  24-hour labs:    Results for orders placed or performed during the hospital encounter of 02/01/23 (from the past 24 hours)   PROSTATIC SPECIFIC ANTIGEN-PSA    Collection Time: 02/02/23  4:32 PM   Result Value Ref Range Prostatic Specific Antigen 0.26 <6.01 NG/ML       Point of Care Testing  (Last 24 hours)       Radiology and other Diagnostics Review:    Pertinent radiology reviewed.

## 2023-02-03 NOTE — Progress Notes
 02/03/2023 8:07 AM                                     Renal Progress Note    Name:  VLADYSLAV MERITHEW   ZYSAY'T Date:  02/03/2023  Admission Date: 02/01/2023  LOS: 2 days              Subjective     He did not have a restful night overall.   Denies shortness of breath.      Medications     Medications    MEDSallopurinoL, 100 mg, Oral, BID  atorvastatin, 40 mg, Oral, QDAY  carvediloL, 12.5 mg, Oral, BID w/meals  doxycycline hyclate, 100 mg, Oral, BID  [START ON 02/04/2023] furosemide, 40 mg, Oral, Q48H*  gabapentin, 100 mg, Oral, QHS  heparin (porcine), 5,000 Units, Subcutaneous, Q8H  levothyroxine, 75 mcg, Oral, QDAY  oxybutynin XL, 5 mg, Oral, QDAY  piperacillin/tazobactam  (ZOSYN) IV, 4.5 g, Intravenous, Q8H*  pramipexole, 1.5 mg, Oral, QHS     IV MEDS  Prn acetaminophen Q4H PRN, melatonin QHS PRN, nitroglycerin Q5 MIN PRN, ondansetron Q6H PRN **OR** ondansetron (ZOFRAN) IV Q6H PRN, polyethylene glycol 3350 QDAY PRN, sennosides-docusate sodium QDAY PRN, traMADoL QDAY PRN           Physical Exam     Vital Signs: Last Filed In 24 Hours Vital Signs: 24 Hour Range   BP: 110/49 (11/10 0455)  Temp: 37.1 ?C (98.7 ?F) (11/10 0454)  Pulse: 71 (11/10 0454)  Respirations: 18 PER MINUTE (11/10 0454)  SpO2: 96 % (11/10 0454)  O2%: 21 % (11/09 2310)  O2 Device: None (Room air) (11/10 0454)  Height: 177.8 cm (5' 10) (11/09 0823) BP: (94-110)/(43-50)   Temp:  [36.8 ?C (98.2 ?F)-37.1 ?C (98.7 ?F)]   Pulse:  [70-72]   Respirations:  [16 PER MINUTE-20 PER MINUTE]   SpO2:  [96 %-98 %]   O2%:  [21 %]   O2 Device: None (Room air)   Intensity Pain Scale (Self Report): 3 (02/02/23 0900)        Intake/Output Summary (Last 24 hours) at 02/03/2023 0807  Last data filed at 02/03/2023 0502  Gross per 24 hour   Intake 702 ml   Output 1500 ml   Net -798 ml      Vitals:    02/01/23 1743 02/01/23 2347 02/02/23 0823   Weight: 96.2 kg (212 lb) 96.5 kg (212 lb 11.9 oz) 96.2 kg (212 lb)       Physical Exam   Gen: Alert and Oriented, No Acute Distress   HEENT: Sclera normal; MMM  CV: no JVD, S1 and S2 normal, no rubs, murmurs or gallops   Pulm: Clear to Auscultation bilateral   GI: BS+ x4, non-tender to palpation  Neuro: Grossly normal, moving all extremities, speech intact  Ext: bilateral lower extremity edema with chronic skin changes  Skin: no rash                     Assessment and Plan      ENGEL PETROSSIAN is a 77 y.o. male  with past medical history of  diabetes mellitus, hypertension, chronic kidney disease stage IV , atrial fibrillation and coronary artery disease . He was admitted on 02-01-23 due to worsening renal function.   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  He has diabetes mellitus which is well controlled.  He has grade A2 albuminuria  Baseline serum creatinine is around 2.5mg /dl. serum creatinine on presentation was 3.61mg /dl.  Renal ultrasound with bilateral mild hydronephrosis .Mildly distended urinary bladder with diffuse bladder   wall trabeculation/diverticula and layering debris.   Obstructive uropathy is likely due to prostatic enlargement  Unfortunately serum creatinine continues to worsen     Plan  Retain urethral catheter to decompress bladder and kidney.  Request urology consult.  Due to low blood pressure, we will avoid alpha blockers .  Hold diuretics and empagliflozin        --Metabolic acidosis  This is due to worsening renal function.  Start sodium bicarbonate 1300mg  po twice daily     --Hypotension  Blood pressure is borderline low.  Continue to monitor and hold diuretics as above.        --Anemia  Assess iron stores with am labs.    -- Atrial fibrillation     He is on carvedilol     --UTI  cellulitis        Discussed with Dr Cathren Harsh and the patient's wife at the bedside       Doran Durand, MD  Pager [phone]

## 2023-02-03 NOTE — Care Plan
Problem: Skin Integrity  Goal: Skin integrity intact  Outcome: Goal Ongoing  Goal: Healing of skin (Wound & Incision)  Outcome: Goal Ongoing  Goal: Healing of skin (Pressure Injury)  Outcome: Goal Ongoing     Problem: High Fall Risk  Goal: High Fall Risk  Outcome: Goal Ongoing

## 2023-02-03 NOTE — Procedures
 VAT consulted for PIV eval. This VAT was able to flush left lower forearm PIV without pain, discomfort, or leaking. Brisk blood return. Primary RN at bedside. Consult completed.

## 2023-02-03 NOTE — Progress Notes
 Internal Medicine - Progress Note      Name:  Paul Benjamin                                             MRN:  [id number]   Admission Date:  02/01/2023                     Assessment/Plan:    Principal Problem:    CKD (chronic kidney disease)    Paul Benjamin is a 77 y.o. male with a past medical history of congestive heart failure, type 2 diabetes, hypertension, and polycystic kidney disease who presents to the emergency department due to generalized weakness and abnormalities on his recent labs.     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 Appears to be around 2.7, currently 3.4  - Has PKD2 mutation  - PTA lasix 20 mg on MWF and 40 mg TTS  - Urine dirty, urine culture NGTD and final  - 11/9 Renal US w/ urinary retention and bilateral pyelo  - Patient received call from Dr. Philomena Course to come to the ED to expedite eval for concerning kidney labs  PLAN:  > CR. 4 today up from 3, will cont. To monitor  > 11/11: Will check iron studies, b12/folate, retic and hapto  > Will go ahead and give IV iron today and another 2 doses of albumin  > transition zosyn to Augmentin BID for 4 more days as tolerated zosyn (11/9-current abx)  > Cont. To hold lasix for now given worsening AKI  > Oxybutynin 5 mg for bladder issues - Trospium not on formulary  > Maintain on renal diet   > Consulted nephrology and following   -Cont. sodium bicarb 1300mg  BID    -transition from coreg to metoprolol to assist w/ low BP  -given [place] results foley placed on 11/9; will d/w urology for follow up (had one for 3/5, but will ask for expedited eval w/I 2 weeks of discharge  > Unable to start flomax given low BP  > Check urine total protein, urea, and cr levels     #Cellulitis of lower legs - improving  - Present on lower extremity most notably on L side  - Was prescribed doxycyline 100 mg BID 01/29/23  PLAN:  > Continue PTA doxycycline 100 mg BID     #Congestive Heart Failure, diastolic  #CAD  #Sick sinus syndrome  #Hx of Afib post ablation and s/p pacemaker  #HTN  - Follows with [facility] cardiology - Dr. Barry Dienes  - PTA atorvastatin 40 mg, Coreg 12.5 mg BID, jardiance 10 mg, lasix 20mg  daily  - Nitroglycerin 0.4 mg SL SUBL  - Not on AC for afib  PLAN:  > Continue atorvastatin 40 mg, transition coreg to metoprolol low dose given low normal BP and hold jardiance and lasix pending above     #T2DM  - Well controlled per recent outpatient notes  - Jardiance 10 mg  - Degludec 20 u daily  PLAN:  > Hold Jardiance 10 mg  > LDCF     #Hypothyrodism  Plan:  > Levothyroxine 75 mcg     #Restless legs syndrome  Plan:  >  Gabapentin 100 mg  > Pramipexole 1.5 mg     #Gout  Plan:  > Allopurinol 100 mg tablet     #Pain  Plan:  > Tramadol 50 mg     FEN: No IVF, replace eletrolytes as needed  Diet: Renal  DVT proph: SCDs and heparin  Code: Full code  Dispo: Cont. Inpatient care, DC TBD pending PT/OT eval and anemia eval    Cecille Po, MD  Internal Medicine/Hospitalist  Med private-C  Pager [phone]    To reach physician or NP 24/7, search [employer] for Med Private ** First Call.   Voalte is the preferred method of communication, but if pager is needed, the number is [phone].    Total Time Today was 80 minutes in the following activities: Preparing to see the patient, Obtaining and/or reviewing separately obtained history, Performing a medically appropriate examination and/or evaluation, Counseling and educating the patient/family/caregiver, Ordering medications, tests, or procedures, Documenting clinical information in the electronic or other health record, Independently interpreting results (not separately reported) and communicating results to the patient/family/caregiver and Care coordination (not separately reported)    Subjective:      NAD.  No acute events reported overnight, foley with some pain, but overall doing ok.      12 point ROS negative except as mentioned above        Objective:                           Vital Signs: Last Filed                 Vital Signs: 24 Hour Range   BP: 94/46 (11/10 0829)  Temp: 37.1 ?C (98.8 ?F) (11/10 2951)  Pulse: 70 (11/10 0829)  Respirations: 18 PER MINUTE (11/10 0829)  SpO2: 97 % (11/10 0829)  O2%: 21 % (11/09 2310)  O2 Device: None (Room air) (11/10 0829) BP: (94-110)/(44-50)   Temp:  [36.8 ?C (98.2 ?F)-37.1 ?C (98.8 ?F)]   Pulse:  [70-72]   Respirations:  [18 PER MINUTE-20 PER MINUTE]   SpO2:  [96 %-98 %]   O2%:  [21 %]   O2 Device: None (Room air)     Vitals:    02/01/23 1743 02/01/23 2347 02/02/23 0823   Weight: 96.2 kg (212 lb) 96.5 kg (212 lb 11.9 oz) 96.2 kg (212 lb)       Intake/Output Summary:  (Last 24 hours)    Intake/Output Summary (Last 24 hours) at 02/03/2023 1056  Last data filed at 02/03/2023 0829  Gross per 24 hour   Intake 1182 ml   Output 1800 ml   Net -618 ml           PHYSICAL EXAMINATION:     Constitutional:       Appearance: Normal appearance.      Comments: Appeared fatigued   HENT:      Head: Normocephalic and atraumatic.   Eyes:      Conjunctiva/sclera: Conjunctivae normal.   Cardiovascular:      Rate and Rhythm: Normal rate.      Comments: Faint 3rd heart sound on auscultation, most notable R upper sternal border  Pulmonary:      Effort: Pulmonary effort is normal.      Breath sounds: Normal breath sounds.   Abdominal:      General: Abdomen is flat.   Genitourinary:     Comments: foley in place  Musculoskeletal:  General: Normal range of motion.      Cervical back: Normal range of motion.      Right lower leg: 1Trace Edema present.      Left lower leg: Trace Edema present.      Comments: B/l lower extremity mild redness - chronic in calves and thighs    Lab Review  24-hour labs:    Results for orders placed or performed during the hospital encounter of 02/01/23 (from the past 24 hours)   PROSTATIC SPECIFIC ANTIGEN-PSA    Collection Time: 02/02/23  4:32 PM   Result Value Ref Range    Prostatic Specific Antigen 0.26 <6.01 NG/ML   COMPREHENSIVE METABOLIC PANEL    Collection Time: 02/03/23  7:47 AM   Result Value Ref Range    Sodium 143 137 - 147 mmol/L    Potassium 4.1 3.5 - 5.1 mmol/L    Chloride 116 (H) 98 - 110 mmol/L    Glucose 119 (H) 70 - 100 mg/dL    Blood Urea Nitrogen 75 (H) 7 - 25 mg/dL    Creatinine 4.54 (H) 0.40 - 1.24 mg/dL    Calcium 8.7 8.5 - 09.8 mg/dL    Total Protein 5.7 (L) 6.0 - 8.0 g/dL    Total Bilirubin 0.5 0.3 - 1.2 mg/dL    Albumin 2.7 (L) 3.5 - 5.0 g/dL    Alk Phosphatase 50 25 - 110 U/L    AST 8 7 - 40 U/L    ALT 8 7 - 56 U/L    CO2 15 (L) 21 - 30 mmol/L    Anion Gap 12 3 - 12    Glomerular Filtration Rate (GFR) 14 (L) >60 mL/min       Point of Care Testing  (Last 24 hours)  Glucose: (!) 119 (02/03/23 0747)    Radiology and other Diagnostics Review:    Pertinent radiology reviewed.

## 2023-02-04 ENCOUNTER — Encounter: Admit: 2023-02-04 | Discharge: 2023-02-04 | Payer: MEDICARE

## 2023-02-04 LAB — CBC AND DIFF
~~LOC~~ BKR ABSOLUTE BASO COUNT: 0 10*3/uL — ABNORMAL HIGH (ref 0.00–0.20)
~~LOC~~ BKR ABSOLUTE BASO COUNT: 0.1 10*3/uL — ABNORMAL LOW (ref 0.00–0.20)
~~LOC~~ BKR ABSOLUTE EOS COUNT: 0.1 10*3/uL — ABNORMAL HIGH (ref 0.00–0.45)
~~LOC~~ BKR ABSOLUTE EOS COUNT: 0.1 10*3/uL — ABNORMAL LOW (ref 0.00–0.45)
~~LOC~~ BKR ABSOLUTE LYMPH COUNT: 0.5 10*3/uL — ABNORMAL LOW (ref 1.00–4.80)
~~LOC~~ BKR ABSOLUTE MONO COUNT: 0.4 10*3/uL — ABNORMAL LOW (ref 0.00–0.80)
~~LOC~~ BKR HEMATOCRIT: 23 % — ABNORMAL LOW (ref 40.0–50.0)
~~LOC~~ BKR HEMOGLOBIN: 7.6 g/dL — ABNORMAL LOW (ref 13.5–16.5)
~~LOC~~ BKR MCH: 28 pg — ABNORMAL HIGH (ref 26.0–34.0)
~~LOC~~ BKR MCH: 27 pg — ABNORMAL HIGH (ref 26.0–34.0)
~~LOC~~ BKR MCHC: 31 g/dL — ABNORMAL LOW (ref 32.0–36.0)
~~LOC~~ BKR MCV: 85 fL — ABNORMAL HIGH (ref 80.0–100.0)
~~LOC~~ BKR MCV: 85 fL — ABNORMAL HIGH (ref 80.0–100.0)
~~LOC~~ BKR MPV: 6.9 fL — ABNORMAL LOW (ref 7.0–11.0)
~~LOC~~ BKR PLATELET COUNT: 169 10*3/uL — ABNORMAL LOW (ref 150–400)
~~LOC~~ BKR RBC COUNT: 2.6 10*6/uL — ABNORMAL LOW (ref 4.40–5.50)
~~LOC~~ BKR RBC COUNT: 2.8 10*6/uL — ABNORMAL LOW (ref 4.40–5.50)
~~LOC~~ BKR RDW: 17 % — ABNORMAL HIGH (ref 11.0–15.0)
~~LOC~~ BKR WBC COUNT: 5 10*3/uL — ABNORMAL HIGH (ref 4.5–11.0)
~~LOC~~ BKR WBC COUNT: 5.5 10*3/uL — ABNORMAL LOW (ref 4.5–11.0)

## 2023-02-04 LAB — COMPREHENSIVE METABOLIC PANEL
~~LOC~~ BKR ALBUMIN: 3 g/dL — ABNORMAL LOW (ref 3.5–5.0)
~~LOC~~ BKR ALK PHOSPHATASE: 44 U/L — ABNORMAL LOW (ref 25–110)
~~LOC~~ BKR ALT: 7 U/L — ABNORMAL LOW (ref 7–56)
~~LOC~~ BKR ANION GAP: 12 10*3/uL — ABNORMAL LOW (ref 3–12)
~~LOC~~ BKR AST: 9 U/L — ABNORMAL HIGH (ref 7–40)
~~LOC~~ BKR CO2: 16 mmol/L — ABNORMAL LOW (ref 21–30)
~~LOC~~ BKR GLOMERULAR FILTRATION RATE (GFR): 15 mL/min — ABNORMAL LOW (ref >60–0.80)
~~LOC~~ BKR POTASSIUM: 4.2 mmol/L — ABNORMAL LOW (ref 3.5–5.1)
~~LOC~~ BKR SODIUM, SERUM: 145 mmol/L — ABNORMAL LOW (ref 137–147)
~~LOC~~ BKR TOTAL BILIRUBIN: 0.6 mg/dL — ABNORMAL HIGH (ref 0.3–1.2)

## 2023-02-04 LAB — POC GLUCOSE
~~LOC~~ BKR POC GLUCOSE: 152 mg/dL — ABNORMAL HIGH (ref 70–100)
~~LOC~~ BKR POC GLUCOSE: 111 mg/dL — ABNORMAL HIGH (ref 70–100)
~~LOC~~ BKR POC GLUCOSE: 116 mg/dL — ABNORMAL HIGH (ref 70–100)
~~LOC~~ BKR POC GLUCOSE: 173 mg/dL — ABNORMAL HIGH (ref 70–100)

## 2023-02-04 MED ORDER — EPOETIN ALFA-EPBX 10,000 UNIT/ML IJ SOLN
10000 [IU] | INTRAVENOUS | 0 refills | Status: DC
Start: 2023-02-04 — End: 2023-02-08
  Administered 2023-02-04: 20:00:00 10000 [IU] via INTRAVENOUS

## 2023-02-04 MED ORDER — HYDROCORTISONE 1 % TP CREA
Freq: Two times a day (BID) | TOPICAL | 0 refills | Status: DC
Start: 2023-02-04 — End: 2023-02-08
  Administered 2023-02-04 – 2023-02-06 (×2): via TOPICAL

## 2023-02-04 MED ORDER — IRON DEXTRAN IVPB
500 mg | Freq: Once | INTRAVENOUS | 0 refills | Status: CP
Start: 2023-02-04 — End: ?
  Administered 2023-02-04 (×2): 500 mg via INTRAVENOUS

## 2023-02-04 MED ORDER — AMOXICILLIN-POT CLAVULANATE 875-125 MG PO TAB
875 mg | Freq: Two times a day (BID) | ORAL | 0 refills | Status: DC
Start: 2023-02-04 — End: 2023-02-05
  Administered 2023-02-04 (×2): 875 mg via ORAL

## 2023-02-04 MED ORDER — DIPHENHYDRAMINE HCL 50 MG/ML IJ SOLN
25 mg | INTRAVENOUS | 0 refills | Status: DC | PRN
Start: 2023-02-04 — End: 2023-02-06

## 2023-02-04 MED ORDER — IRON DEXTRAN IVPB
25 mg | Freq: Once | INTRAVENOUS | 0 refills | Status: CP
Start: 2023-02-04 — End: ?
  Administered 2023-02-04 (×2): 25 mg via INTRAVENOUS

## 2023-02-04 MED ORDER — ALBUMIN, HUMAN 25 % IV SOLP
25 g | INTRAVENOUS | 0 refills | Status: CP
Start: 2023-02-04 — End: ?
  Administered 2023-02-04 (×2): 25 g via INTRAVENOUS

## 2023-02-04 MED ORDER — FUROSEMIDE 10 MG/ML IJ SOLN
40 mg | Freq: Once | INTRAVENOUS | 0 refills | Status: CP
Start: 2023-02-04 — End: ?
  Administered 2023-02-04: 20:00:00 40 mg via INTRAVENOUS

## 2023-02-04 MED ORDER — EPINEPHRINE 1 MG/ML (1 ML) IJ SOLN
.3 mg | INTRAMUSCULAR | 0 refills | Status: DC | PRN
Start: 2023-02-04 — End: 2023-02-06

## 2023-02-04 MED ORDER — HYDROXYZINE HCL 10 MG PO TAB
10 mg | Freq: Three times a day (TID) | ORAL | 0 refills | Status: DC
Start: 2023-02-04 — End: 2023-02-08
  Administered 2023-02-04 – 2023-02-07 (×9): 10 mg via ORAL

## 2023-02-04 MED ORDER — HYDROXYZINE HCL 10 MG PO TAB
10 mg | Freq: Three times a day (TID) | ORAL | 0 refills | Status: DC | PRN
Start: 2023-02-04 — End: 2023-02-08

## 2023-02-04 NOTE — Care Plan
Problem: Skin Integrity  Goal: Skin integrity intact  Outcome: Goal Ongoing  Goal: Healing of skin (Wound & Incision)  Outcome: Goal Ongoing  Goal: Healing of skin (Pressure Injury)  Outcome: Goal Ongoing     Problem: High Fall Risk  Goal: High Fall Risk  Outcome: Goal Ongoing     Problem: Discharge Planning  Goal: Participation in plan of care  Outcome: Goal Ongoing  Goal: Knowledge regarding plan of care  Outcome: Goal Ongoing  Goal: Prepared for discharge  Outcome: Goal Ongoing

## 2023-02-04 NOTE — Progress Notes
Profile updated, care plan/education reviewed, and call light within reach.

## 2023-02-04 NOTE — Progress Notes
 Discussed pt's concerns with his Josephine Igo 3 continuous glucose monitor that he obtained outside this hospital stay showing different blood sugar results from our glucometers  This RN educated pt and wife on the accuracy of fingersticks and management with the hospital's glucometer;   2149: BS result WDL-see EMAR    Pt and wife still concerned that his sugar may be too high; UAP checked sugar at 0002 per family's request- normal result  Upon follow-up assessment, this RN noted pt to  be asymptomatic of both hypoglycemic and hyperglycemic symptoms. Vitals WDL  Pt's wife voiced concern over pt's diastolic number being low; walked pt to bathroom and retook pressure- WDL results obtained    Discussed what symptomatic hypotension would present as; discussed cardiology follow-up that pt has already scheduled outside of this hospital visit    This RN discussed concern with pt's mobility and living independently-noting that patient not able to fully straighten all of the way up when using his walker; Pt and wife mention that they have falls at home and call their children when they need. This RN reinforced need for safe mobility to prevent falls

## 2023-02-04 NOTE — Progress Notes
 02/04/2023 10:43 AM                                     Renal Progress Note    Name:  NAZAIR GRODIN   WFUXN'A Date:  02/04/2023  Admission Date: 02/01/2023  LOS: 3 days              Subjective     Seen with his family at the bedside.    He states he had a better night compared to night before.  Denies shortness of breath.    Medications     Medications    MEDSalbumin 25 %, 25 g, Intravenous, Q6H*  allopurinoL, 100 mg, Oral, BID  amoxicillin/K clavulanate, 875 mg, Oral, BID w/meals  atorvastatin, 40 mg, Oral, QDAY  doxycycline hyclate, 100 mg, Oral, BID  [Held by Provider] furosemide, 40 mg, Oral, Q48H*  gabapentin, 100 mg, Oral, QHS  heparin (porcine), 5,000 Units, Subcutaneous, Q8H  hydrocortisone, , Topical, BID  hydrOXYzine HCL, 10 mg, Oral, TID  insulin aspart (NovoLOG) injection, 0-6 Units, Subcutaneous, ACHS (22)  iron dextran (INFED) 500 mg in sodium chloride 0.9% (NS) 260 mL IVPB, 500 mg, Intravenous, ONCE  levothyroxine, 75 mcg, Oral, QDAY  metoprolol succinate XL, 12.5 mg, Oral, QDAY  oxybutynin XL, 5 mg, Oral, QDAY  pramipexole, 1.5 mg, Oral, QHS  sodium bicarbonate, 1,300 mg, Oral, BID     IV MEDS  Prn acetaminophen Q4H PRN, dextrose 50% (D50) IV PRN, diphenhydrAMINE HCL Q4H PRN, EPINEPHrine (ADRENALIN) 1 mg/mL injection Q5 MIN PRN, hydrOXYzine HCL TID PRN, melatonin QHS PRN, nitroglycerin Q5 MIN PRN, ondansetron Q6H PRN **OR** ondansetron (ZOFRAN) IV Q6H PRN, polyethylene glycol 3350 QDAY PRN, sennosides-docusate sodium QDAY PRN, traMADoL QDAY PRN           Physical Exam     Vital Signs: Last Filed In 24 Hours Vital Signs: 24 Hour Range   BP: 120/60 (11/11 0754)  Temp: 37.2 ?C (98.9 ?F) (11/11 0754)  Pulse: 80 (11/11 0754)  Respirations: 16 PER MINUTE (11/11 0754)  SpO2: 95 % (11/11 0754)  O2 Device: None (Room air) (11/11 0754) BP: (98-124)/(44-63)   Temp:  [36.7 ?C (98.1 ?F)-37.4 ?C (99.4 ?F)]   Pulse:  [70-80]   Respirations:  [14 PER MINUTE-24 PER MINUTE]   SpO2:  [92 %-97 %]   O2 Device: None (Room air)            Intake/Output Summary (Last 24 hours) at 02/04/2023 1043  Last data filed at 02/04/2023 0926  Gross per 24 hour   Intake 1393 ml   Output 2675 ml   Net -1282 ml      Vitals:    02/01/23 1743 02/01/23 2347 02/02/23 0823   Weight: 96.2 kg (212 lb) 96.5 kg (212 lb 11.9 oz) 96.2 kg (212 lb)       Physical Exam   Gen: Alert and Oriented, No Acute Distress   HEENT: Sclera normal; MMM  CV: no JVD, S1 and S2 normal, no rubs, murmurs or gallops   Pulm: Clear to Auscultation bilateral   GI: BS+ x4, non-tender to palpation  Neuro: Grossly normal, moving all extremities, speech intact  Ext: bilateral lower extremity edema with chronic skin changes  Skin: no rash                     Assessment and Plan      Lyman Bishop  P Vazzana is a 77 y.o. male  with past medical history of  diabetes mellitus, hypertension, chronic kidney disease stage IV , atrial fibrillation and coronary artery disease . He was admitted on 02-01-23 due to worsening renal function.   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  He has diabetes mellitus which is well controlled.  He has grade A2 albuminuria  Baseline serum creatinine is around 2.5mg /dl. serum creatinine on presentation was 3.61mg /dl.  Renal ultrasound with bilateral mild hydronephrosis .Mildly distended urinary bladder with diffuse bladder   wall trabeculation/diverticula and layering debris.   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     Plan  Retain urethral catheter to decompress bladder and kidney.  Request urology consult.  Due to low blood pressure, we will avoid alpha blockers .  Hold diuretics and empagliflozin        --Metabolic acidosis  This is due to worsening renal function.  Start sodium bicarbonate 1300mg  po twice daily     --Hypotension  Blood pressure is borderline low.  Continue to monitor and hold diuretics as above. --Anemia  Iron stores are low for patient with advanced chronic kidney disease.  Receiving iv iron today.  Administer epoetin alfa 40347 units iv weekly.    -- Atrial fibrillation     He is on carvedilol     --UTI  cellulitis           Discussed with the primary team   Doran Durand, MD  Pager [phone]

## 2023-02-04 NOTE — Progress Notes
 Pharmacy High Risk Medication Review    A high risk medication review has been performed for Paul Benjamin by a pharmacy team member.      Medication class(es) to review on inpatient med list:  Opioids and Anticholinergic medication (ex. diphenhydramine)      The medications class(es) are noted to be high risk to mobility or mentation in patients >79 years of age. If your patient has been CAM positive (diagnosed with delirium) at any point during this hospitalization then it would be particularly helpful to review these medications. Each medication has its own unique risks and benefits to the patient, we ask that these medications are reviewed and considered for de-prescribing where appropriate.     Consider reviewing [employer] for anticholinergic burden calculation.  If you need further assistance identifying or de-prescribing, please contact pharmacy.      Current Inpatient Medications:  Scheduled Meds:albumin 25% injection 25 g, 25 g, Intravenous, Q6H*  allopurinoL (ZYLOPRIM) tablet 100 mg, 100 mg, Oral, BID  amoxicillin-potassium clavulanate (AUGMENTIN) tablet 875 mg, 875 mg, Oral, BID w/meals  atorvastatin (LIPITOR) tablet 40 mg, 40 mg, Oral, QDAY  doxycycline hyclate (VIBRACIN) tablet 100 mg, 100 mg, Oral, BID  epoetin alfa-epbx (RETACRIT) injection 10,000 Units, 10,000 Units, Intravenous, Q7 Days  [Held by Provider] furosemide (LASIX) tablet 40 mg, 40 mg, Oral, Q48H*  gabapentin (NEURONTIN) capsule 100 mg, 100 mg, Oral, QHS  heparin (porcine) PF syringe 5,000 Units, 5,000 Units, Subcutaneous, Q8H  hydrocortisone (CORTIZONE-10) 1 % topical cream, , Topical, BID  hydrOXYzine HCL (ATARAX) tablet 10 mg, 10 mg, Oral, TID  insulin aspart (U-100) (NOVOLOG FLEXPEN U-100 INSULIN) injection PEN 0-6 Units, 0-6 Units, Subcutaneous, ACHS (22)  iron dextran (INFED) 500 mg in sodium chloride 0.9% (NS) 260 mL IVPB, 500 mg, Intravenous, ONCE  levothyroxine (SYNTHROID) tablet 75 mcg, 75 mcg, Oral, QDAY  metoprolol succinate XL (TOPROL XL) tablet 12.5 mg, 12.5 mg, Oral, QDAY  oxybutynin XL (DITROPAN XL) tablet 5 mg, 5 mg, Oral, QDAY  pramipexole (MIRAPEX) tablet 1.5 mg, 1.5 mg, Oral, QHS  sodium bicarbonate tablet 1,300 mg, 1,300 mg, Oral, BID    Continuous Infusions:  PRN and Respiratory Meds:acetaminophen Q4H PRN, dextrose 50% (D50) IV PRN, diphenhydrAMINE HCL Q4H PRN, EPINEPHrine (ADRENALIN) 1 mg/mL injection Q5 MIN PRN, hydrOXYzine HCL TID PRN, melatonin QHS PRN, nitroglycerin Q5 MIN PRN, ondansetron Q6H PRN **OR** ondansetron (ZOFRAN) IV Q6H PRN, polyethylene glycol 3350 QDAY PRN, sennosides-docusate sodium QDAY PRN, traMADoL QDAY PRN      Thank you,  Quenton Fetter, [facility]  02/04/2023

## 2023-02-04 NOTE — Case Management (ED)
 Case Management Progress Note    NAME:Paul Benjamin                          MRN: [id number]              DOB:[birth year]          AGE: 77 y.o.  ADMISSION DATE: 02/01/2023             DAYS ADMITTED: LOS: 3 days      Today's Date: 02/04/2023    PLAN: d/c plan ongoing.     Expected Discharge Date: 02/06/2023   Is Patient Medically Stable: No, Please explain: ongoing inpatient care.   Are there Barriers to Discharge? no    INTERVENTION/DISPOSITION:  Discharge Planning              Discharge Planning: Home Health   Patient will discharge to home with family assistance when medically stable.    Met with patient/family to discuss post-acute service recommendations which include:Home Health. Provided education to patient/family on available choices, quality data and offered opportunity for questions.  The following choice list(s) provided to patient/family: Medicare.gov list with Quality Data.  Patient/family requested the following referrals be sent: Family requested to review the list - NCM will follow up with patient and family tomorrow.  Transportation              Does the Patient Need Case Management to Arrange Discharge Transport? (ex: facility, ambulance, wheelchair/stretcher, Medicaid, cab, other): No  Will the Patient Use Family Transport?: Yes  Transportation Name, Phone and Availability #1: Spouse to assist with transportation home  Support              Support: Pt/Family Updates re:POC or DC Plan  Info or Referral              Information or Referral to Community Resources: No Needs Identified  Positive SDOH Domains and Potential Barriers      Medication Needs              Medication Needs: No Needs Identified  Financial              Financial: No Needs Identified  Legal              Legal: No Needs Identified  Other              Other/None: No needs identified  Discharge Disposition               Home          Selected Continued Care - Admitted Since 02/01/2023    No services have been selected for the patient.           Eulah Citizen, RN  Amie Critchley

## 2023-02-05 ENCOUNTER — Encounter: Admit: 2023-02-05 | Discharge: 2023-02-05 | Payer: MEDICARE

## 2023-02-05 LAB — IRON + BINDING CAPACITY + %SAT+ FERRITIN
~~LOC~~ BKR FERRITIN: 451 ng/mL — ABNORMAL HIGH (ref 30–300)
~~LOC~~ BKR IRON: 148 g/dL (ref 50–185)
~~LOC~~ BKR TRANSFERRIN: 93 mg/dL — ABNORMAL LOW (ref 185–336)

## 2023-02-05 LAB — COMPREHENSIVE METABOLIC PANEL
~~LOC~~ BKR ALBUMIN: 3.4 g/dL — ABNORMAL LOW (ref 3.5–5.0)
~~LOC~~ BKR ALK PHOSPHATASE: 45 U/L — ABNORMAL LOW (ref 25–110)
~~LOC~~ BKR ALT: 7 U/L — ABNORMAL HIGH (ref 7–56)
~~LOC~~ BKR ANION GAP: 11 10*3/uL — ABNORMAL LOW (ref 3–12)
~~LOC~~ BKR AST: 10 U/L — ABNORMAL LOW (ref 7–40)
~~LOC~~ BKR CO2: 18 mmol/L — ABNORMAL LOW (ref 21–30)
~~LOC~~ BKR GLOMERULAR FILTRATION RATE (GFR): 17 mL/min — ABNORMAL LOW (ref >60–4.80)
~~LOC~~ BKR TOTAL BILIRUBIN: 0.5 mg/dL — ABNORMAL LOW (ref 0.3–1.2)
~~LOC~~ BKR TOTAL PROTEIN: 6.3 g/dL — ABNORMAL LOW (ref 6.0–8.0)

## 2023-02-05 LAB — CBC AND DIFF
~~LOC~~ BKR ABSOLUTE BASO COUNT: 0 10*3/uL — ABNORMAL HIGH (ref 0.00–0.20)
~~LOC~~ BKR ABSOLUTE EOS COUNT: 0.2 10*3/uL — ABNORMAL LOW (ref 0.00–0.45)
~~LOC~~ BKR ABSOLUTE MONO COUNT: 0.4 10*3/uL — ABNORMAL LOW (ref 0.00–0.80)
~~LOC~~ BKR MCH: 27 pg — ABNORMAL HIGH (ref 26.0–34.0)
~~LOC~~ BKR MCHC: 32 g/dL — ABNORMAL HIGH (ref 32.0–36.0)
~~LOC~~ BKR MCV: 85 fL — ABNORMAL HIGH (ref 80.0–100.0)
~~LOC~~ BKR RDW: 17 % — ABNORMAL HIGH (ref 11.0–15.0)

## 2023-02-05 LAB — POC GLUCOSE
~~LOC~~ BKR POC GLUCOSE: 222 mg/dL — ABNORMAL HIGH (ref 70–100)
~~LOC~~ BKR POC GLUCOSE: 112 mg/dL — ABNORMAL HIGH (ref 70–100)
~~LOC~~ BKR POC GLUCOSE: 135 mg/dL — ABNORMAL HIGH (ref 70–100)
~~LOC~~ BKR POC GLUCOSE: >60 mg/dL — ABNORMAL HIGH (ref 70–100)

## 2023-02-05 LAB — MANUAL DIFF

## 2023-02-05 LAB — GLUCOSE, RANDOM: ~~LOC~~ BKR GLUCOSE, RANDOM: 172 mg/dL — ABNORMAL HIGH (ref 70–100)

## 2023-02-05 LAB — RETICULOCYTE COUNT
~~LOC~~ BKR CORRECTED RETIC: 1.1 % — ABNORMAL HIGH (ref 180–914)
~~LOC~~ BKR UNCORRECTED RETIC: 1.9 % — ABNORMAL LOW (ref 0.5–2.0)

## 2023-02-05 MED ORDER — CYANOCOBALAMIN (VITAMIN B-12) 500 MCG PO TAB
1000 ug | Freq: Every day | ORAL | 0 refills | Status: DC
Start: 2023-02-05 — End: 2023-02-08
  Administered 2023-02-06 – 2023-02-07 (×2): 1000 ug via ORAL

## 2023-02-05 MED ORDER — AMOXICILLIN-POT CLAVULANATE 875-125 MG PO TAB
875 mg | Freq: Two times a day (BID) | ORAL | 0 refills | Status: DC
Start: 2023-02-05 — End: 2023-02-06
  Administered 2023-02-05 – 2023-02-06 (×3): 875 mg via ORAL

## 2023-02-05 MED ORDER — FOLIC ACID IN NS SYR
1 mg | Freq: Once | INTRAVENOUS | 0 refills | Status: CP
Start: 2023-02-05 — End: ?
  Administered 2023-02-05 (×2): 1 mg via INTRAVENOUS

## 2023-02-05 MED ORDER — CYANOCOBALAMIN (VITAMIN B-12) 1,000 MCG/ML IJ SOLN
1000 ug | Freq: Once | SUBCUTANEOUS | 0 refills | Status: CP
Start: 2023-02-05 — End: ?
  Administered 2023-02-05: 15:00:00 1000 ug via SUBCUTANEOUS

## 2023-02-05 NOTE — Progress Notes
 PHYSICAL THERAPY  ASSESSMENT      Name: Paul Benjamin   MRN: [id number]     DOB: 1946/03/03      Age: 77 y.o.  Admission Date: 02/01/2023     LOS: 4 days     Date of Service: 02/05/2023      Mobility  Patient Turn/Position: Supine  Progressive Mobility Level: Walk in room  Distance Walked (feet): 15 ft (x4 bouts)  Level of Assistance: Assist X2 (Recommended for nursing staff)  Assistive Device: Walker  Activity Limited By: Weakness;Fatigue    Subjective  Reason for Admission and Past Medical Hx: Paul Benjamin is a 77 y.o. male with a past medical history of congestive heart failure, type 2 diabetes, hypertension, and polycystic kidney disease who presents to the emergency department due to generalized weakness and abnormalities on his recent labs.  Mental / Cognitive: Alert;Cooperative  Pain: No complaint of pain  Pain Interventions: Patient agrees to participate in therapy with current pain level  Persons Present: Spouse    Home Living Situation  Lives With: Spouse/significant other  Type of Home: House  Entry Stairs: No stairs  In-Home Stairs: Able to live on main level  Patient Owned Equipment: Cane: Pyramid;Walker with wheels;Wheelchair;Walker: Rollator [phone] with a non-functioning L handbrake)    Prior Level of Function  Level Of Independence: Independent with ADL and household mobility with device  History of Falls in Past 3 Months: Yes  Comments: Spouse reports patient had COVID in 2020 and since this time attributes forward trunk/flexed posture to truncal weakness from this.      ROM  ROM Position Assessed: Seated  R LE ROM: WFL  R LE ROM Method: Active assisted  L LE ROM: WFL  L LE ROM Method: Active assisted    Strength  Strength Position Assessed: Seated  Overall Strength: Generalized weakness    Posture/Neurological  Head Control: Independent  Posture: Rounded shoulders;Forward head;Flexed trunk  Overall Tone: Normal;LLE;RLE  Posture/Neuro Comments: With verbal/tactile cueing, patient achieves increased thoracic extension for more upright standing this afternoon. Maintains for several seconds before fatiguing and often quickly fatigues with this position when initiating gait with walker.    Bed Mobility/Transfer  Bed Mobility: Supine to Sit: Minimal Assist;Verbal Cues;Head of Bed Elevated;Assist with Trunk  Bed Mobility: Sit to Supine: Minimal Assist;HOB Elevated;Verbal Cues;Assist with B LE  Transfer Type: Sit to/from Stand  Transfer: Assistance Level: To/From;Bed;Moderate Assist  Transfer: Assistive Device: Nurse, adult  Transfers: Type Of Assistance: For Safety Considerations;For Strength Deficit;For Balance;Verbal Cues  Other Transfer Type: Sit to/from Stand  Other Transfer: Assistance Level: To/From;Toilet;Moderate Assist  Other Transfer: Assistive Device: Nurse, adult  Other Transfer: Type Of Assistance: For Safety Considerations;For Strength Deficit;For Balance;Verbal Cues  End Of Activity Status: In Bed;Nursing Notified;Instructed Patient to Request Assist with Mobility;Instructed Patient to Use Call Light    Balance  Sitting Balance: Static Sitting Balance;Standby Assist;Dynamic Sitting Balance;Moderate Assist;Uneven Surface  Standing Balance: Static Standing Balance;Dynamic Standing Balance;2 UE support;Minimal Assist;Even Surface  Tinetti Gait & Balance Test: Assessed  Sitting Balance: 1 - Steady, safe  Arises: 1 - Able, uses arms to help  Attempt to Rise: 0 - Unable without help  Immediate Standing Balance (first 5 seconds): 0 - Unsteady (moves feet, sway, swaggers)  Standing Balance: 1- Steady but >4 inch base of support and requires support  Sternal Nudge (feet together): 0 - Begins to fall  Eyes Closed (feet together): 0 - Unsteady  Turning 360*-Continuous: 0 - Discontinues steps  Turning 360*-Steadiness: 0 - Unsteady (grabs, staggers)  Sitting Down: 1 - Uses arms or not a smooth motion  Tinetti Balance Total : 4 out of 16  Tinetti Balance Total (Old Row): 4 Points  Gait Initiation: 1 - No hesitancy  Step Length/Height  R Swing Foot: 0 - R swing foot does not pass L stance foot  Step Length/Height  R Foot Clearance: 1 - R foot completely clears floor  Step Length/Height  L Swing Foot: 0 - L swing foot does not pass R stance foot  Step Length/Height  L Foot Clear: 1 - L foot completely clears floor  Step Symmetry: 1 - R and L step appear equal  Step Continuity: 0 - Stopping or discontinuity between steps  Path/Excursion: 1 - Mild/moderate deviation or uses walking aid  Trunk: 0 - Marked sway or uses walking aid  Walking Stance: 1 - Heels almost touching while walking  Tinetti Gait Total: 6 out of 12  Tinetti Gait Total (Old Row): 6 Points  Tinetti Balance & Gait Total : 10 out of 28  Tinetti Balance & Gait Total (Read Only): 10 Points  Tinetti Fall Risk: Below 19 High Fall Risk  5x Sit to Stand Result (seconds): 0  5X Sit to Stand Comment:: Patient unable to clear glutes from bed singular time for 5x sit<>stand testing this morning. Requires moderate PT assistance as well as BUE assistance to complete all stands this date.    Gait  Gait Distance: 15 feet (x4 bouts)  Gait: Assistance Level: Minimal Assist;Safety Considerations  Gait: Assistive Device: Nurse, adult  Gait: Descriptors: Forward trunk flexion;Pace: Slow;Decreased step length  Activity Limited By: Weakness;Complaint of Fatigue    Activity/Exercise  Activity Tolerance: Tolerates 10-15 Minutes of Activity  Activity Limited By: Complaint of Fatigue  Perceived Exertion Scale: 8    Education  Persons Educated: Patient/Family  Patient Barriers To Learning: None Noted  Interventions: Repetition of Instructions;Demonstration Provided  Teaching Methods: Verbal Instruction;Demonstration  Patient Response: Verbalized Understanding;More Instruction Required  Topics: Plan/Goals of PT Interventions;Use of Assistive Device/Orthosis;Mobility Progression;Safety Awareness;Up with Assist Only;Importance of Increasing Activity;Recommend Continued Therapy    Assessment/Progress  Impaired Mobility Due To: Decreased Strength;Impaired Balance;Decreased Activity Tolerance;Safety Concerns;Deconditioning  Assessment/Progress: Should Improve w/ Continued PT    AM-PAC 6 Clicks Basic Mobility Inpatient  Turning from your back to your side while in a flat bed without using bed rails: A lot  Moving from lying on your back to sitting on the side of a flat bed without using bedrails : A Lot  Moving to and from a bed to a chair (including a wheelchair): A Lot  Standing up from a chair using your arms (e.g. wheelchair, or bedside chair): A Lot  To walk in hospital room: A Lot  Climbing 3-5 steps with a railing: Total  Basic Mobility Inpatient Raw Score: 11  Standardized (T-scale) Score: 30.25    Goals  Goal Formulation: With Patient  Time For Goal Achievement: 3 days, To, 7 days  Patient Will Go Supine To/From Sit: w/ Stand By Assist  Patient Will Transfer Bed/Chair: w/ Stand By Assist  Patient Will Transfer Sit to Stand: w/ Stand By Assist  Patient Will Ambulate: 51-100 Feet, w/ Dan Humphreys, w/ Stand By Assist    Plan  Treatment Interventions: Mobility training;Strengthening  Plan Frequency: 3-5 Days per Week  PT Plan for Next Visit: Progress proximal strengthening, continue education regarding thoracic extension and postural considerations, progress gait distance/quality with chair follow as appropriate.  PT Discharge Recommendations  Recommendation: Inpatient setting  Patient Currently Requires Physical Assist With: All mobility      Therapist  Edward Jolly, PT, DPT [phone]  Date  02/05/2023

## 2023-02-05 NOTE — Progress Notes
 Internal Medicine - Progress Note      Name:  Paul Benjamin                                             MRN:  [id number]   Admission Date:  02/01/2023                     Assessment/Plan:    Principal Problem:    CKD (chronic kidney disease)    Paul Benjamin is a 77 y.o. male with a past medical history of congestive heart failure, type 2 diabetes, hypertension, and polycystic kidney disease who presents to the emergency department due to generalized weakness and abnormalities on his recent labs.     #AKI on CKD - improving  #CKD stage 3B  #Polycystic kidney disease, autosomal dominant  #Urinary Incontinence  #Anemia of chronic disease  #UTI complicated (no symptoms)  - Follows with [facility] nephrology; baseline Cr. Appears to be around 2.7, currently 3.4  - Has PKD2 mutation  - PTA lasix 20 mg on MWF and 40 mg TTS  - Urine dirty, urine culture NGTD and final  - 11/9 Renal US w/ urinary retention and bilateral pyelo  - Patient received call from Dr. Philomena Course to come to the ED to expedite eval for concerning kidney labs  - 11/11 iron studies consistent w/ anemia of chronic disease, B12 low normal, and folic acid low normal  PLAN:  > CR. Improving w/ foley and diuresis; Cr. 4.1->3.4 today  > Will replace B12 and folic acid, Neph gave Epo on 11/11 and 1x IV lasix; will restart home lasix  > transitioned zosyn to Augmentin BID (11/9-current abx); EOT 02/07/23  > Oxybutynin 5 mg for bladder issues - Trospium not on formulary  > Maintain on renal diet   > Consulted nephrology and following   -Cont. sodium bicarb 1300mg  BID    -transition from coreg to metoprolol to assist w/ low BP  -given [place] results foley placed on 11/9; will d/w urology for follow up (had one for 3/5, but will ask for expedited eval w/I 2 weeks of discharge (need to order at DC)  > Unable to start flomax given low BP  > Check urine total protein, urea, and cr levels     #Cellulitis of lower legs - improving  - Present on lower extremity most notably on L side  - Was prescribed doxycyline 100 mg BID 01/29/23  PLAN:  > Continue PTA doxycycline 100 mg BID     #Congestive Heart Failure, diastolic  #CAD  #Sick sinus syndrome  #Hx of Afib post ablation and s/p pacemaker  #HTN  - Follows with [facility] cardiology - Dr. Barry Dienes  - PTA atorvastatin 40 mg, Coreg 12.5 mg BID, jardiance 10 mg, lasix 20mg  daily  - Nitroglycerin 0.4 mg SL SUBL  - Not on AC for afib  PLAN:  > Continue atorvastatin 40 mg, transition coreg to metoprolol low dose given low normal BP and hold jardiance and lasix pending above     #T2DM  - Well controlled per recent outpatient notes  - Jardiance 10 mg  - Degludec 20 u daily  PLAN:  > Hold Jardiance 10 mg  > LDCF     #Hypothyrodism  Plan:  > Levothyroxine 75 mcg     #Restless legs syndrome  Plan:  > Gabapentin  100 mg  > Pramipexole 1.5 mg     #Gout  Plan:  > Allopurinol 100 mg tablet     #Pain  Plan:  > Tramadol 50 mg     FEN: No IVF, replace eletrolytes as needed  Diet: Renal  DVT proph: SCDs and heparin  Code: Full code  Dispo: Cont. Inpatient care, DC TBD pending PT/OT eval     Cecille Po, MD  Internal Medicine/Hospitalist  Med private-C  Pager [phone]    To reach physician or NP 24/7, search [employer] for Med Private ** First Call.   Voalte is the preferred method of communication, but if pager is needed, the number is [phone].    Total Time Today was 80 minutes in the following activities: Preparing to see the patient, Obtaining and/or reviewing separately obtained history, Performing a medically appropriate examination and/or evaluation, Counseling and educating the patient/family/caregiver, Ordering medications, tests, or procedures, Documenting clinical information in the electronic or other health record, Independently interpreting results (not separately reported) and communicating results to the patient/family/caregiver and Care coordination (not separately reported)    Subjective:      NAD.  No acute events reported overnight, foley with some pain, but overall feeling better, more strength. No f/c/n/v, no abd pain, no lightheadedness w/ standing.      12 point ROS negative except as mentioned above        Objective:                           Vital Signs: Last Filed                 Vital Signs: 24 Hour Range   BP: 139/63 (11/12 0522)  Temp: 36.7 ?C (98.1 ?F) (11/12 0522)  Pulse: 72 (11/12 0522)  Respirations: 18 PER MINUTE (11/12 0522)  SpO2: 95 % (11/12 0522)  O2%: 21 % (11/11 2201)  O2 Device: None (Room air) (11/12 0522) BP: (109-139)/(53-63)   Temp:  [36.6 ?C (97.9 ?F)-37.2 ?C (98.9 ?F)]   Pulse:  [71-80]   Respirations:  [16 PER MINUTE-18 PER MINUTE]   SpO2:  [95 %-98 %]   O2%:  [21 %]   O2 Device: None (Room air)     Vitals:    02/01/23 1743 02/01/23 2347 02/02/23 0823   Weight: 96.2 kg (212 lb) 96.5 kg (212 lb 11.9 oz) 96.2 kg (212 lb)       Intake/Output Summary:  (Last 24 hours)    Intake/Output Summary (Last 24 hours) at 02/05/2023 0719  Last data filed at 02/05/2023 7253  Gross per 24 hour   Intake 2492 ml   Output 3150 ml   Net -658 ml           PHYSICAL EXAMINATION:     Constitutional:       Appearance: Normal appearance.      Comments: Appeared fatigued   HENT:      Head: Normocephalic and atraumatic.   Eyes:      Conjunctiva/sclera: Conjunctivae normal.   Cardiovascular:      Rate and Rhythm: Normal rate.      Comments: Faint 3rd heart sound on auscultation, most notable R upper sternal border  Pulmonary:      Effort: Pulmonary effort is normal.      Breath sounds: Normal breath sounds.   Abdominal:      General: Abdomen is flat.   Genitourinary:     Comments: foley in  place  Musculoskeletal:         General: Normal range of motion.      Cervical back: Normal range of motion.      Right lower leg: 1Trace Edema present.      Left lower leg: Trace Edema present.      Comments: B/l lower extremity mild redness - chronic in calves and thighs    Lab Review  24-hour labs:    Results for orders placed or performed during the hospital encounter of 02/01/23 (from the past 24 hours)   POC GLUCOSE    Collection Time: 02/04/23  1:04 PM   Result Value Ref Range    Glucose, POC 116 (H) 70 - 100 mg/dL   POC GLUCOSE    Collection Time: 02/04/23  4:33 PM   Result Value Ref Range    Glucose, POC 173 (H) 70 - 100 mg/dL   IRON + BINDING CAPACITY + %SAT+ FERRITIN    Collection Time: 02/04/23  4:35 PM   Result Value Ref Range    Iron 148 50 - 185 ?g/dL    Iron Binding-TIBC 161 (L) 270 - 380 mcg/dL    % Saturation >096 (H) 28 - 42 %    Transferrin 93 (L) 185 - 336 mg/dL    Ferritin 045 (H) 30 - 300 ng/mL   RETICULOCYTE COUNT    Collection Time: 02/04/23  4:35 PM   Result Value Ref Range    Retic, Uncorrected 1.9 0.5 - 2.0 %    Retic, Corrected 1.1 %    Retic, Absolute 56.9 30.0 - 94.0 10*3/uL   VITAMIN B12    Collection Time: 02/04/23  4:35 PM   Result Value Ref Range    Vitamin B12 392 180 - 914 pg/mL   FOLATE, SERUM    Collection Time: 02/04/23  4:35 PM   Result Value Ref Range    Serum Folate 17.6 >3.9 ng/mL   HAPTOGLOBIN    Collection Time: 02/04/23  4:35 PM   Result Value Ref Range    Haptoglobin 262 (H) 30 - 200 mg/dL   POC GLUCOSE    Collection Time: 02/04/23  9:44 PM   Result Value Ref Range    Glucose, POC 222 (H) 70 - 100 mg/dL       Point of Care Testing  (Last 24 hours)  POC Glucose (Download): (!) 222 (02/04/23 2144)    Radiology and other Diagnostics Review:    Pertinent radiology reviewed.

## 2023-02-05 NOTE — Progress Notes
 02/05/2023 9:54 AM                                     Renal Progress Note    Name:  Paul Benjamin   GNFAO'Z Date:  02/05/2023  Admission Date: 02/01/2023  LOS: 4 days              Subjective     Seen with his family at the bedside.      Denies shortness of breath.    Medications     Medications    MEDSallopurinoL, 100 mg, Oral, BID  amoxicillin/K clavulanate, 875 mg, Oral, BID w/meals  atorvastatin, 40 mg, Oral, QDAY  [START ON 02/06/2023] cyanocobalamin (vitamin B-12), 1,000 mcg, Oral, QDAY  doxycycline hyclate, 100 mg, Oral, BID  epoetin alfa-epbx, 10,000 Units, Intravenous, Q7 Days  furosemide, 40 mg, Oral, Q48H*  gabapentin, 100 mg, Oral, QHS  heparin (porcine), 5,000 Units, Subcutaneous, Q8H  hydrocortisone, , Topical, BID  hydrOXYzine HCL, 10 mg, Oral, TID  insulin aspart (NovoLOG) injection, 0-6 Units, Subcutaneous, ACHS (22)  levothyroxine, 75 mcg, Oral, QDAY  metoprolol succinate XL, 12.5 mg, Oral, QDAY  oxybutynin XL, 5 mg, Oral, QDAY  pramipexole, 1.5 mg, Oral, QHS  sodium bicarbonate, 1,300 mg, Oral, BID     IV MEDS  Prn acetaminophen Q4H PRN, dextrose 50% (D50) IV PRN, diphenhydrAMINE HCL Q4H PRN, EPINEPHrine (ADRENALIN) 1 mg/mL injection Q5 MIN PRN, hydrOXYzine HCL TID PRN, melatonin QHS PRN, nitroglycerin Q5 MIN PRN, ondansetron Q6H PRN **OR** ondansetron (ZOFRAN) IV Q6H PRN, polyethylene glycol 3350 QDAY PRN, sennosides-docusate sodium QDAY PRN, traMADoL QDAY PRN           Physical Exam     Vital Signs: Last Filed In 24 Hours Vital Signs: 24 Hour Range   BP: 110/53 (11/12 0719)  Temp: 36.8 ?C (98.2 ?F) (11/12 0719)  Pulse: 70 (11/12 0719)  Respirations: 20 PER MINUTE (11/12 0719)  SpO2: 95 % (11/12 0719)  O2%: 21 % (11/11 2201)  O2 Device: None (Room air) (11/12 0719) BP: (109-139)/(53-63)   Temp:  [36.6 ?C (97.9 ?F)-36.8 ?C (98.2 ?F)]   Pulse:  [70-79]   Respirations:  [16 PER MINUTE-20 PER MINUTE]   SpO2:  [95 %-98 %]   O2%:  [21 %]   O2 Device: None (Room air)            Intake/Output Summary (Last 24 hours) at 02/05/2023 0954  Last data filed at 02/05/2023 3086  Gross per 24 hour   Intake 1899 ml   Output 3600 ml   Net -1701 ml      Vitals:    02/01/23 1743 02/01/23 2347 02/02/23 0823   Weight: 96.2 kg (212 lb) 96.5 kg (212 lb 11.9 oz) 96.2 kg (212 lb)       Physical Exam   Gen: Alert and Oriented, No Acute Distress   HEENT: Sclera normal; MMM  CV: no JVD, S1 and S2 normal, no rubs, murmurs or gallops   Pulm: Clear to Auscultation bilateral   GI: BS+ x4, non-tender to palpation  Neuro: Grossly normal, moving all extremities, speech intact  Ext: bilateral lower extremity edema with chronic skin changes  Skin: no rash                     Assessment and Plan      Paul Benjamin is a 77 y.o. male  with past medical history of  diabetes mellitus, hypertension, chronic kidney disease stage IV , atrial fibrillation and coronary artery disease . He was admitted on 02-01-23 due to worsening renal function.   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  He has diabetes mellitus which is well controlled.  He has grade A2 albuminuria  Baseline serum creatinine is around 2.5mg /dl. serum creatinine on presentation was 3.61mg /dl.  Renal ultrasound with bilateral mild hydronephrosis .Mildly distended urinary bladder with diffuse bladder   wall trabeculation/diverticula and layering debris.   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     Plan  Retain urethral catheter to decompress bladder and kidney.  He will need follow up with urology  Due to low blood pressure, we will avoid alpha blockers .    --Hypervolemia   Resume furosemide 40mg  daily     --Metabolic acidosis  This is due to worsening renal function.  Continue sodium bicarbonate 1300mg  po twice daily     --Hypotension  Blood pressure is borderline low.  Continue to monitor and hold diuretics as above. --Anemia  Iron stores are low for patient with advanced chronic kidney disease.  Receiving iv iron today.  Administer epoetin alfa 29528 units iv weekly.    -- Atrial fibrillation     He is on carvedilol     --UTI  cellulitis           Discussed with the family   Doran Durand, MD  Pager [phone]

## 2023-02-05 NOTE — Care Plan
Problem: Skin Integrity  Goal: Skin integrity intact  Outcome: Goal Ongoing  Goal: Healing of skin (Wound & Incision)  Outcome: Goal Ongoing  Goal: Healing of skin (Pressure Injury)  Outcome: Goal Ongoing     Problem: High Fall Risk  Goal: High Fall Risk  Outcome: Goal Ongoing     Problem: Discharge Planning  Goal: Participation in plan of care  Outcome: Goal Ongoing  Goal: Knowledge regarding plan of care  Outcome: Goal Ongoing  Goal: Prepared for discharge  Outcome: Goal Ongoing

## 2023-02-06 LAB — BASIC METABOLIC PANEL
~~LOC~~ BKR ANION GAP: 11 10*6/uL — ABNORMAL LOW (ref 3–12)
~~LOC~~ BKR BLD UREA NITROGEN: 65 mg/dL — ABNORMAL HIGH (ref 7–25)
~~LOC~~ BKR CALCIUM: 9.1 mg/dL — ABNORMAL LOW (ref 8.5–10.6)
~~LOC~~ BKR CHLORIDE: 116 mmol/L — ABNORMAL HIGH (ref 98–110)
~~LOC~~ BKR CO2: 19 mmol/L — ABNORMAL LOW (ref 21–30)
~~LOC~~ BKR CREATININE: 3 mg/dL — ABNORMAL HIGH (ref 0.40–1.24)
~~LOC~~ BKR GLOMERULAR FILTRATION RATE (GFR): 20 mL/min — ABNORMAL LOW (ref >60–15.0)
~~LOC~~ BKR GLUCOSE, RANDOM: 107 mg/dL — ABNORMAL HIGH (ref 70–100)
~~LOC~~ BKR POTASSIUM: 3.9 mmol/L — ABNORMAL HIGH (ref 3.5–5.1)
~~LOC~~ BKR SODIUM, SERUM: 145 mmol/L — ABNORMAL HIGH (ref 137–147)

## 2023-02-06 LAB — POC GLUCOSE
~~LOC~~ BKR POC GLUCOSE: 252 mg/dL — ABNORMAL HIGH (ref 70–100)
~~LOC~~ BKR POC GLUCOSE: 129 mg/dL — ABNORMAL HIGH (ref 70–100)
~~LOC~~ BKR POC GLUCOSE: 155 mg/dL — ABNORMAL HIGH (ref 70–100)
~~LOC~~ BKR POC GLUCOSE: 181 mg/dL — ABNORMAL HIGH (ref 70–100)

## 2023-02-06 MED ORDER — AMOXICILLIN-POT CLAVULANATE 500-125 MG PO TAB
500 mg | Freq: Two times a day (BID) | ORAL | 0 refills | Status: DC
Start: 2023-02-06 — End: 2023-02-08
  Administered 2023-02-06 – 2023-02-07 (×2): 500 mg via ORAL

## 2023-02-06 MED ORDER — FUROSEMIDE 40 MG PO TAB
40 mg | Freq: Every day | ORAL | 0 refills | Status: DC
Start: 2023-02-06 — End: 2023-02-08
  Administered 2023-02-07: 16:00:00 40 mg via ORAL

## 2023-02-06 NOTE — Progress Notes
 PHYSICAL THERAPY  PROGRESS NOTE          Name: Paul Benjamin   MRN: [id number]     DOB: [birth year]      Age: 77 y.o.  Admission Date: 02/01/2023     LOS: 5 days     Date of Service: 02/06/2023        Mobility  Patient Turn/Position: Weight shifted (Chair);Chair  Progressive Mobility Level: Walk in room  Distance Walked (feet): 50 ft  Level of Assistance: Assist X1  Assistive Device: Walker  Activity Limited By: Fatigue;Weakness    Subjective  Significant Hospital Events: 77 y.o. male with a past medical history of congestive heart failure, type 2 diabetes, hypertension, and polycystic kidney disease who presents to the emergency department due to generalized weakness and abnormalities on his recent labs.  Mental / Cognitive: Alert;Cooperative  Pain: No complaint of pain  Persons Present: Family    Home Living Situation  Lives With: Spouse/significant other  Type of Home: House  Entry Stairs: No stairs  In-Home Stairs: Able to live on main level  Patient Owned Equipment: Cane: Pyramid;Walker with wheels;Wheelchair;Walker: Rollator [phone] with a non-functioning L handbrake)    Prior Level of Function  Level Of Independence: Independent with ADL and household mobility with device    Bed Mobility/Transfer  Bed Mobility: Supine to Sit: Standby Assist;Head of Bed Elevated;Use of Rail;Requires Extra Time  Transfer Type: Sit to/from Stand  Transfer: Assistance Level: To/From;Bed;Bed Side Chair;Standby Assist  Transfer: Assistive Device: Nurse, adult  Transfers: Type Of Assistance: For Safety Considerations;For Strength Deficit;For Balance;Verbal Cues;Requires Extra Time  End Of Activity Status: Up in Chair;Nursing Notified;Instructed Patient to Request Assist with Mobility;Instructed Patient to Use Call Light (pads alarm activated)    Gait  Gait Distance: 50 feet  Gait: Assistance Level: Minimal Assist  Gait: Assistive Device: Roller Walker  Gait: Descriptors: Forward trunk flexion;Pace: Slow;Decreased step length  Activity Limited By: Weakness;Complaint of Fatigue    Activity/Exercise  Comments: Sit <> stands from raised bed x5.    Education  Persons Educated: Patient/Family  Patient Barriers To Learning: None Noted  Teaching Methods: Verbal Instruction;Demonstration  Patient Response: Verbalized Understanding;More Instruction Required  Topics: Plan/Goals of PT Interventions;Use of Assistive Device/Orthosis;Mobility Progression;Safety Awareness;Up with Assist Only;Importance of Increasing Activity;Recommend Continued Therapy    Assessment/Progress  Impaired Mobility Due To: Decreased Strength;Impaired Balance;Decreased Activity Tolerance;Safety Concerns;Deconditioning  Impaired Strength Due To: Decreased Activity Tolerance  Assessment/Progress: Should Improve w/ Continued PT    AM-PAC 6 Clicks Basic Mobility Inpatient  Turning from your back to your side while in a flat bed without using bed rails: A Little  Moving from lying on your back to sitting on the side of a flat bed without using bedrails : A Little  Moving to and from a bed to a chair (including a wheelchair): A Little  Standing up from a chair using your arms (e.g. wheelchair, or bedside chair): A Little  To walk in hospital room: A Little  Climbing 3-5 steps with a railing: A Lot  Basic Mobility Inpatient Raw Score: 17  Standardized (T-scale) Score: 39.67    Goals  Goal Formulation: With Patient  Time For Goal Achievement: 3 days, To, 7 days  Patient Will Go Supine To/From Sit: w/ Stand By Assist  Patient Will Transfer Bed/Chair: w/ Stand By Assist  Patient Will Transfer Sit to Stand: w/ Stand By Assist  Patient Will Ambulate: 51-100 Feet, w/ Dan Humphreys, w/ Stand By Assist    Plan  Treatment Interventions: Mobility training;Strengthening  Plan Frequency: 3-5 Days per Week  PT Plan for Next Visit: Progress proximal strengthening, continue education regarding thoracic extension and postural considerations, progress gait distance/quality with chair follow as appropriate.    PT Discharge Recommendations  Recommendation: Inpatient setting    Therapist: Ranae Plumber, PT  Date: 02/06/2023

## 2023-02-06 NOTE — Consults
 Physical Medicine & Rehabilitation Consult Service    Name: Paul Benjamin   MRN: [id number]     DOB: 26-Jun-1945      Age: 77 y.o.  Admission Date: 02/01/2023     LOS: 5 days     Date of Service: 02/06/2023        Date of Service: 02/06/2023  Financial Class: Payor: MEDICARE / Plan: MEDICARE PART A AND B / Product Type: Medicare /   Referring Physician: Earmon Phoenix, MD  Reason for Consult: evaluate for Post-Acute Rehab/Placement  Precautions: Fall    Assessment & Plan:  Principal Problem:    CKD (chronic kidney disease)  Cellulitis  Complicated UTI    Gait abnormality  Impaired mobility/ADLs  Impaired transfers         Paul Benjamin is a 77 y.o. year old male admitted to [facility] on  02/01/2023 with the following issues:    Progressive weakness    Impairments: poor activity tolerance and weakness  Activity Limitations:  dressing - upper, dressing - lower, transfers, ambulation, and stairs  Participation Restrictions: unable to return home safely  Family / Patient Dispositional Goals: return home with family assistance    Overall Functional Goals  Gait and mobility Mod I   Transfers Mod I   Upper body dressing Mod I   Lower body dressing Mod I   Toileting Mod I   Bathing Mod I   Cognition / Communication Cognition grossly intact          Recommendations:  Post-acute care rehabilitation needs: subacute rehabilitation.  Patient would not have medical complexity to warrant daily physician oversight at this point.  He will need more therapy prior to going home, though, as requested from spouse.      Barriers/Facilitators:  Barriers: High burden of care  Facilitators: good family / social support  Rehabilitation Prognosis: Good    ____________________________________________________________________________    Impaired gait/mobility/transfers:  The patient will benefit from continued work with PT to address mobility deficits    Impaired ADLs:  The patient will benefit from ongoing OT to address functional deficits      Thank you for this consultation.  Please call our consult pager with questions or concerns.    Sherie Don Meline Russaw, DO      History of Present Illness:    CC: Debility due to complicated UTI, Acute Kidney injury    Hospital Course: Paul Benjamin is a pleasant 77 y.o. male with PMH of congestive heart failure, type 2 diabetes, hypertension, and polycystic kidney disease who presents to the emergency department due to generalized weakness.  He was found to have AKI on CKD, complicated UTI- bilateral pyelonephritis.     Patient noting that he is getting stronger every day.  Spouse present who states she does not quite feel comfortable at his current mobility level, going back home.  Patient noted to live in a house with 2 steps to enter but ability to stay on 1 level once inside.  Functionally, patient active with live scaled and miniature train models.  Patient reports living in [place], where there are limited amounts of therapy opportunities.    The primary team has consulted PT and OT, and will continue working with therapies to address functional and mobility deficits, rehab is now consulted for post-acute rehab/placement recommendations.       The patient's family/social support consists of: Spouse.    Past Medical History:   Diagnosis Date  Atrial fibrillation (HCC)     CAD (coronary artery disease) 09/30/2006    CHF (congestive heart failure) (HCC) 2010    Coronary artery disease (CAD)     s/p PCI LAD    HX: anticoagulation     Baby aspirin    Hypercholesterolemia     Hypertension     Hypertriglyceridemia     Kidney disease     Obesity     Sleep apnea     SSS (sick sinus syndrome) (HCC)     dual chamber PPM  / [employer]    Type II diabetes mellitus (HCC)         Surgical History:   Procedure Laterality Date    PACEMAKER PLACEMENT  09/30/2006    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    Performed by Kathreen Cornfield, MD at [facility]    Removal Permanent Pacemaker N/A 01/17/2016    Performed by Kathreen Cornfield, MD at [facility]    BURR HOLE      CARDIAC CATHERIZATION      CORONARY STENT PLACEMENT      HX APPENDECTOMY          Social History     Socioeconomic History    Marital status: Married   Occupational History     Employer: [employer]     Comment: retired   Tobacco Use    Smoking status: Former     Current packs/day: 0.00     Average packs/day: 1 pack/day for 4.0 years (4.0 ttl pk-yrs)     Types: Cigarettes     Start date: 03/26/1962     Quit date: 03/26/1966     Years since quitting: 56.9    Smokeless tobacco: Never   Vaping Use    Vaping status: Never Used   Substance and Sexual Activity    Alcohol use: No     Comment: rarely    Drug use: Never    Sexual activity: Never       Family History   Problem Relation Name Age of Onset    Asthma Father Jomarie Longs     Cardiovascular Father Jomarie Longs     Hypertension Mother Malena Catholic     High Cholesterol Mother Lucille         Scheduled Meds:allopurinoL (ZYLOPRIM) tablet 100 mg, 100 mg, Oral, BID  amoxicillin/K clavulanate (AUGMENTIN) tablet 500 mg, 500 mg, Oral, BID w/meals  atorvastatin (LIPITOR) tablet 40 mg, 40 mg, Oral, QDAY  cyanocobalamin (vitamin B-12) tablet 1,000 mcg, 1,000 mcg, Oral, QDAY  doxycycline hyclate (VIBRACIN) tablet 100 mg, 100 mg, Oral, BID  epoetin alfa-epbx (RETACRIT) injection 10,000 Units, 10,000 Units, Intravenous, Q7 Days  furosemide (LASIX) tablet 40 mg, 40 mg, Oral, Q48H*  gabapentin (NEURONTIN) capsule 100 mg, 100 mg, Oral, QHS  heparin (porcine) PF syringe 5,000 Units, 5,000 Units, Subcutaneous, Q8H  hydrocortisone (CORTIZONE-10) 1 % topical cream, , Topical, BID  hydrOXYzine HCL (ATARAX) tablet 10 mg, 10 mg, Oral, TID  insulin aspart (U-100) (NOVOLOG FLEXPEN U-100 INSULIN) injection PEN 0-6 Units, 0-6 Units, Subcutaneous, ACHS (22)  levothyroxine (SYNTHROID) tablet 75 mcg, 75 mcg, Oral, QDAY  metoprolol succinate XL (TOPROL XL) tablet 12.5 mg, 12.5 mg, Oral, QDAY  oxybutynin XL (DITROPAN XL) tablet 5 mg, 5 mg, Oral, QDAY  pramipexole (MIRAPEX) tablet 1.5 mg, 1.5 mg, Oral, QHS  sodium bicarbonate tablet 1,300 mg, 1,300 mg, Oral, BID    Continuous Infusions:  PRN and Respiratory Meds:acetaminophen Q4H PRN, dextrose 50% (D50) IV  PRN, hydrOXYzine HCL TID PRN, melatonin QHS PRN, nitroglycerin Q5 MIN PRN, ondansetron Q6H PRN **OR** ondansetron (ZOFRAN) IV Q6H PRN, polyethylene glycol 3350 QDAY PRN, sennosides-docusate sodium QDAY PRN, traMADoL QDAY PRN       Allergies   Allergen Reactions    Ciprofloxacin RASH    Lisinopril SEE COMMENTS     hyperkalemia    Cefuroxime HIVES     Tolerated augmentin Aug 2024 and zosyn in Nov 2024    Clindamycin RASH    Plavix [Clopidogrel] RASH    Sulfa (Sulfonamide Antibiotics) HIVES    Quinine SEE COMMENTS     Allergy recorded in SMS: QUININE                 Prior Level of Function:       The patient was independent for all mobility/ambulation and activities of daily living.      Home Environment:  No data recorded  No data recorded  Type of Home: House (02/06/2023 10:00 AM)    Entry Stairs: No stairs (02/06/2023 10:00 AM)    In-Home Stairs: Able to live on main level (02/06/2023 10:00 AM)    No data recorded  No data recorded      Current Level Of Function:  PT Gait:Gait Distance: 50 feet Gait: Assistance Level: Minimal Assist Gait: Assistive Device: Roller Walker  Bed Mobility/Transfers  Bed Mobility: Supine to Sit: Standby Assist, Head of Bed Elevated, Use of Rail, Requires Extra Time  Bed Mobility: Sit to Supine: Minimal Assist, HOB Elevated, Verbal Cues, Assist with B LE  Transfer Type: Sit to/from Stand  Transfer: Assistance Level: To/From, Bed, Bed Side Chair, Standby Assist  Transfer: Assistive Device: Nurse, adult  Transfers: Type Of Assistance: For Safety Considerations, For Strength Deficit, For Balance, Verbal Cues, Requires Extra Time  Other Transfer Type: Sit to/from Stand  Other Transfer: Assistance Level: To/From, Toilet, Moderate Assist  Other Transfer: Assistive Device: Nurse, adult  Other Transfer: Type Of Assistance: For Safety Considerations, For Strength Deficit, For Balance, Verbal Cues  End Of Activity Status: Up in Chair, Nursing Notified, Instructed Patient to Request Assist with Mobility, Instructed Patient to Use Call Light (pads alarm activated)   OT     SLP COGNITIVE EVALUATION SUMMARY     PRAGMATICS:    BEHAVIOR:    AUDITORY COMPREHENSION:      ORIENTATION:    AUDITORY ATTENTION/WORKING MEMORY:    AUDITORY MEMORY/SUSTAINED ATTENTION:    NEW LEARNING:    SEQUENCING/ORGANIZATION:    PROBLEM SOLVING:    REASONING:      MATH/MONEY SKILLS:      VISUAL PERCEPTUAL:    SWALLOW EVALUATION SUMMARY                       Review of Systems:    A 14 point review of systems was negative except for: that noted in the HPI      Physical Exam:    BP: 118/61 (11/13 0755)  Temp: 36.5 ?C (97.7 ?F) (11/13 0755)  Pulse: 71 (11/13 0755)  Respirations: 18 PER MINUTE (11/13 0755)  SpO2: 100 % (11/13 0755)  O2 Device: None (Room air) (11/13 0755)  Body mass index is 30.42 kg/m?.     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  Heart: Extremities well perfused  Lungs: non labored breathing  GU:  - Foley  Skin: no gross lesions appreciated  Ext: purposeful  movement of extremities   MS:   Root Right Left   Shoulder Abduction C5 5 5   Elbow Flexion C5 5 5   Elbow Extension C7 5 5   Wrist Extension C6 5 5   Finger Flexion C8 5 5   Hip Flexion L2 5 5   Knee Extension L3 5 5   Dorsiflexion L4 5 5   Plantarflexion S1 5 5   EHL Extension L5 5 5     Neuro:  Cranial Nerves Cranial Nerves 2-12 are grossly intact   DTR's 1+ throughout   Upper Extremity Tone Normal   Lower Extremity Tone Normal   Upper Extremity Sensation Intact to light touch bilaterally   Lower Extremity Sensation Intact to light touch bilaterally   Clonus Negative Bilaterally   Proprioception Intact Bilaterally   Memory/Cognition/Speech Within limits Intake/Output Summary (Last 24 hours) at 02/06/2023 1407  Last data filed at 02/06/2023 0821  Gross per 24 hour   Intake 840 ml   Output 1400 ml   Net -560 ml        Hematology:    Lab Results   Component Value Date    HGB 8.3 02/05/2023    HGB 8.0 02/02/2023    HCT 25.5 02/05/2023    HCT 24.4 02/02/2023    PLTCT 160 02/05/2023    PLTCT 186 02/02/2023    WBC 4.1 02/05/2023    WBC 4.9 02/02/2023    NEUT 75 02/05/2023    NEUT 81 02/02/2023    ANC 3.10 02/05/2023    ANC 4.04 02/02/2023    LYMPH 2 10/31/2018    ALC 0.40 02/05/2023    ALC 0.37 02/02/2023    MONA 9 02/05/2023    MONA 9 02/02/2023    AMC 0.40 02/05/2023    AMC 0.42 02/02/2023    EOSA 5 02/05/2023    EOSA 2 02/02/2023    ABC 0.00 02/05/2023    ABC 0.02 02/02/2023    MCV 85.3 02/05/2023    MCV 83.9 02/02/2023    MCH 27.6 02/05/2023    MCH 27.6 02/02/2023    MCHC 32.3 02/05/2023    MCHC 32.9 02/02/2023    MPV 6.8 02/05/2023    MPV 6.8 02/02/2023    RDW 17.2 02/05/2023    RDW 17.3 02/02/2023   , Coagulation:    Lab Results   Component Value Date    PT 13.0 10/01/2006    PTT 47.0 10/22/2018    INR 1.2 10/22/2018    and General Chemistry:    Lab Results   Component Value Date    NA 145 02/06/2023    NA 145 02/02/2023    K 3.9 02/06/2023    K 4.1 02/02/2023    CL 116 02/06/2023    CL 118 02/02/2023    CO2 19 02/06/2023    CO2 17 02/02/2023    GAP 11 02/06/2023    GAP 10 02/02/2023    BUN 65 02/06/2023    BUN 76 02/02/2023    CR 3.09 02/06/2023    CR 3.42 02/02/2023    GLU 107 02/06/2023    GLU 96 02/02/2023    GLU 113 01/06/2016    CA 9.1 02/06/2023    CA 8.6 02/02/2023    ALBUMIN 3.4 02/05/2023    ALBUMIN 2.8 02/02/2023    LACTIC 2.3 10/31/2018    MG 2.0 02/01/2023    TOTBILI 0.5 02/05/2023    TOTBILI 0.4 02/02/2023    PO4 4.1 02/01/2023  Radiology:  Reviewed    Renal US 02/02/23  IMPRESSION       1. Mild bilateral hydronephrosis. No renal stones.     2.  Layering debris in the bladder.     3.  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 Sherie Don Quentavious Rittenhouse, DO

## 2023-02-06 NOTE — Case Management (ED)
 Case Management Progress Note    NAME:Paul Benjamin                          MRN: [id number]              DOB:Nov 01, 1945          AGE: 77 y.o.  ADMISSION DATE: 02/01/2023             DAYS ADMITTED: LOS: 5 days      Today's Date: 02/06/2023    PLAN: Anticipate pt to dc to inpatient setting at dc     Expected Discharge Date: 02/07/2023   Is Patient Medically Stable: Yes   Are there Barriers to Discharge? no    INTERVENTION/DISPOSITION:  Discharge Planning                  SW spoke to pt and pt family regarding dc recommendations  After some consideration, pt family stated they wanted to try and move forward with placement at a rehab near the hospital  SW sent referral to [facility] for review     Transportation              Does the Patient Need Case Management to Arrange Discharge Transport? (ex: facility, ambulance, wheelchair/stretcher, Medicaid, cab, other): No  Will the Patient Use Family Transport?: Yes  Transportation Name, Phone and Availability #1: Spouse to assist with transportation home  Support              Support: Pt/Family Updates re:POC or DC Plan  Info or Referral              Information or Referral to [facility]: No Needs Identified  Positive SDOH Domains and Potential Barriers                   Medication Needs              Medication Needs: No Needs Identified                                                                                                                                          Financial              Financial: No Needs Identified  Legal              Legal: No Needs Identified  Other              Other/None: No needs identified  Discharge Disposition  Selected Continued Care - Admitted Since 02/01/2023       [facility]       Service Provider Services Address Phone Fax Patient Preferred    [facility] [address], [place], [place] [address] [phone] --                      Mertie Moores, LMSW    [phone]

## 2023-02-06 NOTE — Progress Notes
 02/06/2023 5:56 AM                                     Renal Progress Note    Name:  DONNIE GHILARDI   VFIEP'P Date:  02/06/2023  Admission Date: 02/01/2023  LOS: 5 days              Subjective     Seen with his family at the bedside.    He had a good night.  Denies shortness of breath.    Medications     Medications    MEDSallopurinoL, 100 mg, Oral, BID  amoxicillin/K clavulanate, 875 mg, Oral, BID w/meals  atorvastatin, 40 mg, Oral, QDAY  cyanocobalamin (vitamin B-12), 1,000 mcg, Oral, QDAY  doxycycline hyclate, 100 mg, Oral, BID  epoetin alfa-epbx, 10,000 Units, Intravenous, Q7 Days  furosemide, 40 mg, Oral, Q48H*  gabapentin, 100 mg, Oral, QHS  heparin (porcine), 5,000 Units, Subcutaneous, Q8H  hydrocortisone, , Topical, BID  hydrOXYzine HCL, 10 mg, Oral, TID  insulin aspart (NovoLOG) injection, 0-6 Units, Subcutaneous, ACHS (22)  levothyroxine, 75 mcg, Oral, QDAY  metoprolol succinate XL, 12.5 mg, Oral, QDAY  oxybutynin XL, 5 mg, Oral, QDAY  pramipexole, 1.5 mg, Oral, QHS  sodium bicarbonate, 1,300 mg, Oral, BID     IV MEDS  Prn acetaminophen Q4H PRN, dextrose 50% (D50) IV PRN, diphenhydrAMINE HCL Q4H PRN, EPINEPHrine (ADRENALIN) 1 mg/mL injection Q5 MIN PRN, hydrOXYzine HCL TID PRN, melatonin QHS PRN, nitroglycerin Q5 MIN PRN, ondansetron Q6H PRN **OR** ondansetron (ZOFRAN) IV Q6H PRN, polyethylene glycol 3350 QDAY PRN, sennosides-docusate sodium QDAY PRN, traMADoL QDAY PRN           Physical Exam     Vital Signs: Last Filed In 24 Hours Vital Signs: 24 Hour Range   BP: 117/59 (11/13 0345)  Temp: 36.4 ?C (97.5 ?F) (11/13 0345)  Pulse: 74 (11/13 0345)  Respirations: 16 PER MINUTE (11/13 0345)  SpO2: 94 % (11/13 0345)  O2 Device: None (Room air) (11/13 0345) BP: (110-117)/(44-59)   Temp:  [36.4 ?C (97.5 ?F)-36.8 ?C (98.2 ?F)]   Pulse:  [69-74]   Respirations:  [16 PER MINUTE-20 PER MINUTE]   SpO2:  [93 %-96 %]   O2 Device: None (Room air)            Intake/Output Summary (Last 24 hours) at 02/06/2023 0556  Last data filed at 02/06/2023 0346  Gross per 24 hour   Intake 840 ml   Output 1850 ml   Net -1010 ml      Vitals:    02/01/23 1743 02/01/23 2347 02/02/23 0823   Weight: 96.2 kg (212 lb) 96.5 kg (212 lb 11.9 oz) 96.2 kg (212 lb)       Physical Exam   Gen: Alert and Oriented, No Acute Distress   HEENT: Sclera normal; MMM  CV: no JVD, S1 and S2 normal, no rubs, murmurs or gallops   Pulm: Clear to Auscultation bilateral   GI: BS+ x4, non-tender to palpation  Neuro: Grossly normal, moving all extremities, speech intact  Ext: bilateral lower extremity edema with chronic skin changes  Skin: no rash                     Assessment and Plan      KENAAN RUETER is a 77 y.o. male  with past medical history of  diabetes mellitus, hypertension, chronic kidney  disease stage IV , atrial fibrillation and coronary artery disease . He was admitted on 02-01-23 due to worsening renal function.   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  He has diabetes mellitus which is well controlled.  He has grade A2 albuminuria  Baseline serum creatinine is around 2.5mg /dl. serum creatinine on presentation was 3.61mg /dl.  Renal ultrasound with bilateral mild hydronephrosis .Mildly distended urinary bladder with diffuse bladder   wall trabeculation/diverticula and layering debris.   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     Plan  Serum creatinine slowly improving.  Retain urethral catheter to decompress bladder and kidney.  He will need follow up with urology  Due to low blood pressure, we will avoid alpha blockers .    --Hypervolemia  Change the dosing of furosemide to 40mg  daily     --Metabolic acidosis  This is due to worsening renal function.  Continue sodium bicarbonate 1300mg  po twice daily     --Hypotension  Blood pressure is borderline low.  Continue to monitor and hold diuretics as above. --Anemia  Iron stores are low for patient with advanced chronic kidney disease.  Receiving iv iron today.  Administer epoetin alfa 16109 units iv weekly.    -- Atrial fibrillation     He is on carvedilol     --UTI  cellulitis           Discussed with the family   Doran Durand, MD  Pager [phone]

## 2023-02-07 ENCOUNTER — Encounter: Admit: 2023-02-07 | Discharge: 2023-02-07 | Payer: MEDICARE

## 2023-02-07 ENCOUNTER — Inpatient Hospital Stay: Admit: 2023-02-01 | Discharge: 2023-02-07 | Disposition: A | Discharge status: Rehabilitation Facility | Payer: MEDICARE

## 2023-02-07 DIAGNOSIS — Z7984 Long term (current) use of oral hypoglycemic drugs: Secondary | ICD-10-CM

## 2023-02-07 DIAGNOSIS — D509 Iron deficiency anemia, unspecified: Secondary | ICD-10-CM

## 2023-02-07 DIAGNOSIS — Z888 Allergy status to other drugs, medicaments and biological substances status: Secondary | ICD-10-CM

## 2023-02-07 DIAGNOSIS — R8281 Pyuria: Secondary | ICD-10-CM

## 2023-02-07 DIAGNOSIS — Z79899 Other long term (current) drug therapy: Secondary | ICD-10-CM

## 2023-02-07 DIAGNOSIS — Z825 Family history of asthma and other chronic lower respiratory diseases: Secondary | ICD-10-CM

## 2023-02-07 DIAGNOSIS — E669 Obesity, unspecified: Secondary | ICD-10-CM

## 2023-02-07 DIAGNOSIS — I495 Sick sinus syndrome: Secondary | ICD-10-CM

## 2023-02-07 DIAGNOSIS — N136 Pyonephrosis: Secondary | ICD-10-CM

## 2023-02-07 DIAGNOSIS — I2510 Atherosclerotic heart disease of native coronary artery without angina pectoris: Secondary | ICD-10-CM

## 2023-02-07 DIAGNOSIS — Z7409 Other reduced mobility: Secondary | ICD-10-CM

## 2023-02-07 DIAGNOSIS — I4891 Unspecified atrial fibrillation: Secondary | ICD-10-CM

## 2023-02-07 DIAGNOSIS — R269 Unspecified abnormalities of gait and mobility: Secondary | ICD-10-CM

## 2023-02-07 DIAGNOSIS — L03116 Cellulitis of left lower limb: Secondary | ICD-10-CM

## 2023-02-07 DIAGNOSIS — Z950 Presence of cardiac pacemaker: Secondary | ICD-10-CM

## 2023-02-07 DIAGNOSIS — Z6830 Body mass index (BMI) 30.0-30.9, adult: Secondary | ICD-10-CM

## 2023-02-07 DIAGNOSIS — E7800 Pure hypercholesterolemia, unspecified: Secondary | ICD-10-CM

## 2023-02-07 DIAGNOSIS — N3289 Other specified disorders of bladder: Secondary | ICD-10-CM

## 2023-02-07 DIAGNOSIS — I5032 Chronic diastolic (congestive) heart failure: Secondary | ICD-10-CM

## 2023-02-07 DIAGNOSIS — G4730 Sleep apnea, unspecified: Secondary | ICD-10-CM

## 2023-02-07 DIAGNOSIS — G2581 Restless legs syndrome: Secondary | ICD-10-CM

## 2023-02-07 DIAGNOSIS — Q612 Polycystic kidney, adult type: Secondary | ICD-10-CM

## 2023-02-07 DIAGNOSIS — Z881 Allergy status to other antibiotic agents status: Secondary | ICD-10-CM

## 2023-02-07 DIAGNOSIS — N184 Chronic kidney disease, stage 4 (severe): Secondary | ICD-10-CM

## 2023-02-07 DIAGNOSIS — Z882 Allergy status to sulfonamides status: Secondary | ICD-10-CM

## 2023-02-07 DIAGNOSIS — Z8249 Family history of ischemic heart disease and other diseases of the circulatory system: Secondary | ICD-10-CM

## 2023-02-07 DIAGNOSIS — E781 Pure hyperglyceridemia: Secondary | ICD-10-CM

## 2023-02-07 DIAGNOSIS — Z79890 Hormone replacement therapy (postmenopausal): Secondary | ICD-10-CM

## 2023-02-07 DIAGNOSIS — R5383 Other fatigue: Secondary | ICD-10-CM

## 2023-02-07 DIAGNOSIS — Z87891 Personal history of nicotine dependence: Secondary | ICD-10-CM

## 2023-02-07 DIAGNOSIS — I959 Hypotension, unspecified: Secondary | ICD-10-CM

## 2023-02-07 DIAGNOSIS — Z955 Presence of coronary angioplasty implant and graft: Secondary | ICD-10-CM

## 2023-02-07 DIAGNOSIS — Z9049 Acquired absence of other specified parts of digestive tract: Secondary | ICD-10-CM

## 2023-02-07 DIAGNOSIS — E1122 Type 2 diabetes mellitus with diabetic chronic kidney disease: Secondary | ICD-10-CM

## 2023-02-07 DIAGNOSIS — N400 Enlarged prostate without lower urinary tract symptoms: Secondary | ICD-10-CM

## 2023-02-07 DIAGNOSIS — D631 Anemia in chronic kidney disease: Secondary | ICD-10-CM

## 2023-02-07 DIAGNOSIS — L03115 Cellulitis of right lower limb: Secondary | ICD-10-CM

## 2023-02-07 DIAGNOSIS — I130 Hypertensive heart and chronic kidney disease with heart failure and stage 1 through stage 4 chronic kidney disease, or unspecified chronic kidney disease: Secondary | ICD-10-CM

## 2023-02-07 DIAGNOSIS — E872 Acidosis, unspecified: Secondary | ICD-10-CM

## 2023-02-07 DIAGNOSIS — M109 Gout, unspecified: Secondary | ICD-10-CM

## 2023-02-07 LAB — BASIC METABOLIC PANEL
~~LOC~~ BKR ANION GAP: 9 g/dL — ABNORMAL HIGH (ref 3–12)
~~LOC~~ BKR BLD UREA NITROGEN: 58 mg/dL — ABNORMAL HIGH (ref 7–25)
~~LOC~~ BKR CALCIUM: 8.6 mg/dL — ABNORMAL HIGH (ref 8.5–10.6)
~~LOC~~ BKR CHLORIDE: 115 mmol/L — ABNORMAL HIGH (ref 98–110)
~~LOC~~ BKR CO2: 21 mmol/L — ABNORMAL LOW (ref 21–30)
~~LOC~~ BKR CREATININE: 2.9 mg/dL — ABNORMAL HIGH (ref 0.40–1.24)
~~LOC~~ BKR GLOMERULAR FILTRATION RATE (GFR): 21 mL/min — ABNORMAL LOW (ref >60–15.0)
~~LOC~~ BKR POTASSIUM: 3.9 mmol/L — ABNORMAL LOW (ref 3.5–5.1)
~~LOC~~ BKR SODIUM, SERUM: 145 mmol/L — ABNORMAL LOW (ref 137–147)

## 2023-02-07 LAB — POC GLUCOSE
~~LOC~~ BKR POC GLUCOSE: 278 mg/dL — ABNORMAL HIGH (ref 70–100)
~~LOC~~ BKR POC GLUCOSE: 140 mg/dL — ABNORMAL HIGH (ref 70–100)
~~LOC~~ BKR POC GLUCOSE: 267 mg/dL — ABNORMAL HIGH (ref 70–100)

## 2023-02-07 LAB — CBC
~~LOC~~ BKR MPV: 6.7 fL — ABNORMAL LOW (ref 7.0–11.0)
~~LOC~~ BKR PLATELET COUNT: 171 10*3/uL — ABNORMAL LOW (ref 150–400)
~~LOC~~ BKR RBC COUNT: 3 10*6/uL — ABNORMAL LOW (ref 4.40–5.50)

## 2023-02-07 MED ORDER — SENNOSIDES-DOCUSATE SODIUM 8.6-50 MG PO TAB
1 | Freq: Every day | ORAL | 0 refills | Status: AC | PRN
Start: 2023-02-07 — End: ?

## 2023-02-07 MED ORDER — DOXYCYCLINE HYCLATE 100 MG PO TAB
100 mg | Freq: Two times a day (BID) | ORAL | 0 refills | 8.00000 days | Status: AC
Start: 2023-02-07 — End: ?

## 2023-02-07 MED ORDER — METOPROLOL SUCCINATE 25 MG PO TB24
12.5 mg | Freq: Every day | ORAL | 0 refills | 90.00000 days | Status: DC
Start: 2023-02-07 — End: 2023-02-27

## 2023-02-07 MED ORDER — POLYETHYLENE GLYCOL 3350 17 GRAM PO PWPK
17 g | Freq: Every day | ORAL | 0 refills | 22.00000 days | Status: AC | PRN
Start: 2023-02-07 — End: ?

## 2023-02-07 MED ORDER — AMOXICILLIN-POT CLAVULANATE 500-125 MG PO TAB
500 mg | Freq: Two times a day (BID) | ORAL | 0 refills | 7.00000 days | Status: AC
Start: 2023-02-07 — End: ?

## 2023-02-07 MED ORDER — SODIUM BICARBONATE 650 MG PO TAB
1300 mg | Freq: Two times a day (BID) | ORAL | 0 refills | 30.00000 days | Status: DC
Start: 2023-02-07 — End: 2023-03-19

## 2023-02-07 MED ORDER — INSULIN ASPART 100 UNIT/ML SC FLEXPEN
0-6 [IU] | Freq: Three times a day (TID) | SUBCUTANEOUS | 0 refills | 30.00000 days | Status: DC
Start: 2023-02-07 — End: 2023-04-24

## 2023-02-07 NOTE — Progress Notes
 OCCUPATIONAL THERAPY  ASSESSMENT NOTE    Name: Paul Benjamin   MRN: [id number]     DOB: 1945/12/16      Age: 77 y.o.  Admission Date: 02/01/2023     LOS: 6 days     Date of Service: 02/07/2023    Mobility  Patient Turn/Position: Chair (Found in recliner )  Progressive Mobility Level: Walk in room  Level of Assistance: Assist X1  Assistive Device: Walker  Activity Limited By: Weakness    Subjective  Significant Hospital Events: 77 y.o. male with a past medical history of congestive heart failure, type 2 diabetes, hypertension, and polycystic kidney disease who presents to the emergency department due to generalized weakness and abnormalities on his recent labs.  Mental / Cognitive: Alert;Cooperative  Pain: No complaint of pain  Pain Interventions: Patient agrees to participate in therapy with current pain level  Persons Present: Family  Comments: Pt supine upon arrival and sitting in chair at end of session    Home Living Situation  Lives With: Spouse/significant other  Type of Home: House  Entry Stairs: No stairs  In-Home Stairs: Able to live on main level  Patient Owned Equipment: Cane: Pyramid;Walker with wheels;Wheelchair;Walker: Rollator [phone] with a non-functioning L handbrake)    Prior Level of Function  Level Of Independence: Independent with ADL and household mobility with device  History of Falls in Past 3 Months: Yes  Comments: Spouse reports patient had COVID in 2020 and since this time attributes forward trunk/flexed posture to truncal weakness from this.    ADL's  LE Dressing Assist: Maximum Assist  Toileting Assist: Maximum Assist  Comment: Pt supine to sit with extra time and cues SBA. Pt stood from bed with min A and walked to toilet with min A and RW. Pt stood from toilet with mod A and hygiene with max A. Returned to bed for a seated rest, educated on erect posture using scapular retraction, pt has difficulty due to weakness. Pt walks to chair with min A, cues for safety    ADL Mobility  Bed Mobility: Supine to Sit: Standby assist  Transfer Type: Sit to/from stand  Transfer: Assistance Level: Moderate assist;From;Toilet    Cognition  Overall Cognitive Status: WFL to Adequately Complete Self Care Tasks Safely    Education  Persons Educated: Patient  Barriers To Learning: None Noted  Teaching Methods: Verbal Instruction  Patient Response: Verbalized Understanding  Topics: Role of OT, Goals for Therapy  Goal Formulation: With Patient    Assessment  Assessment: Decreased ADL Status;Decreased UE Strength;Decreased Safe/Judg during ADL;Decreased Endurance;Decreased Self-Care Trans  Prognosis: Good;w/Cont OT s/p Acute Discharge  Goal Formulation: Patient    AM-PAC 6 Clicks Daily Activity Inpatient  Putting on and taking off regular lower body clothes: A Lot  Bathing (Including washing, rinsing, drying): A Lot  Toileting, which includes using toilet, bedpan, or urinal: A Lot  Putting on and taking off regular upper body clothing: A Little  Taking care of personal grooming such as brushing teeth: A Little  Eating meals: A Little  Daily Activity Raw Score: 15  Standardized (T-scale) Score: 34.69    Plan  Progress: Progressing Toward Goals  OT Frequency: 2-3x/week  OT Plan for Next Visit: standing at sink with chair behind, LB dressing    ADL Goals  Patient Will Perform Grooming: w/ Stand By Assist  Patient Will Perform LE Dressing: w/ Stand By Assist    Functional Transfer Goals  Pt Will Perform All Functional Transfers:  w/ Stand By Assist    OT Discharge Recommendations  Recommendation: Inpatient setting  Patient Currently Requires Physical Assist With: All mobility;All personal care ADLs;All home functioning ADLs    Therapist: Ivin Booty, OTR/L [id number]  Date: 02/07/2023

## 2023-02-07 NOTE — Progress Notes
 02/07/2023 5:35 PM                                     Renal Progress Note    Name:  Paul Benjamin   OZHYQ'M Date:  02/07/2023  Admission Date: 02/01/2023  LOS: 6 days              Subjective     Seen with his family at the bedside.    He had a good night.  Denies shortness of breath.    Medications     Medications    MEDSallopurinoL, 100 mg, Oral, BID  amoxicillin/K clavulanate, 500 mg, Oral, BID w/meals  atorvastatin, 40 mg, Oral, QDAY  cyanocobalamin (vitamin B-12), 1,000 mcg, Oral, QDAY  doxycycline hyclate, 100 mg, Oral, BID  epoetin alfa-epbx, 10,000 Units, Intravenous, Q7 Days  furosemide, 40 mg, Oral, QDAY  gabapentin, 100 mg, Oral, QHS  heparin (porcine), 5,000 Units, Subcutaneous, Q8H  hydrocortisone, , Topical, BID  hydrOXYzine HCL, 10 mg, Oral, TID  insulin aspart (NovoLOG) injection, 0-6 Units, Subcutaneous, ACHS (22)  levothyroxine, 75 mcg, Oral, QDAY  metoprolol succinate XL, 12.5 mg, Oral, QDAY  oxybutynin XL, 5 mg, Oral, QDAY  pramipexole, 1.5 mg, Oral, QHS  sodium bicarbonate, 1,300 mg, Oral, BID     IV MEDS  Prn acetaminophen Q4H PRN, dextrose 50% (D50) IV PRN, hydrOXYzine HCL TID PRN, melatonin QHS PRN, nitroglycerin Q5 MIN PRN, ondansetron Q6H PRN **OR** ondansetron (ZOFRAN) IV Q6H PRN, polyethylene glycol 3350 QDAY PRN, sennosides-docusate sodium QDAY PRN, traMADoL QDAY PRN           Physical Exam     Vital Signs: Last Filed In 24 Hours Vital Signs: 24 Hour Range   BP: 113/63 (11/14 1534)  Temp: 36.3 ?C (97.4 ?F) (11/14 1534)  Pulse: 71 (11/14 1534)  Respirations: 18 PER MINUTE (11/14 1534)  SpO2: 100 % (11/14 1534)  O2%: 21 % (11/13 1951)  O2 Device: None (Room air) (11/14 1534) BP: (113-142)/(53-63)   Temp:  [36.3 ?C (97.4 ?F)-36.8 ?C (98.3 ?F)]   Pulse:  [63-71]   Respirations:  [18 PER MINUTE]   SpO2:  [93 %-100 %]   O2%:  [21 %]   O2 Device: None (Room air)            Intake/Output Summary (Last 24 hours) at 02/07/2023 1735  Last data filed at 02/07/2023 1600  Gross per 24 hour Intake 710 ml   Output 1850 ml   Net -1140 ml      Vitals:    02/01/23 1743 02/01/23 2347 02/02/23 0823   Weight: 96.2 kg (212 lb) 96.5 kg (212 lb 11.9 oz) 96.2 kg (212 lb)       Physical Exam   Gen: Alert and Oriented, No Acute Distress   HEENT: Sclera normal; MMM  CV: no JVD, S1 and S2 normal, no rubs, murmurs or gallops   Pulm: Clear to Auscultation bilateral   GI: BS+ x4, non-tender to palpation  Neuro: Grossly normal, moving all extremities, speech intact  Ext: bilateral lower extremity edema with chronic skin changes  Skin: no rash                     Assessment and Plan      Paul Benjamin is a 77 y.o. male  with past medical history of  diabetes mellitus, hypertension, chronic kidney disease stage IV ,  atrial fibrillation and coronary artery disease . He was admitted on 02-01-23 due to worsening renal function.   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  He has diabetes mellitus which is well controlled.  He has grade A2 albuminuria  Baseline serum creatinine is around 2.5mg /dl. serum creatinine on presentation was 3.61mg /dl.  Renal ultrasound with bilateral mild hydronephrosis .Mildly distended urinary bladder with diffuse bladder   wall trabeculation/diverticula and layering debris.   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     Plan  Serum creatinine slowly improving.  Retain urethral catheter to decompress bladder and kidney.  He will need follow up with urology  Due to low blood pressure, we will avoid alpha blockers .    --Hypervolemia  Continue  furosemide 40mg  daily     --Metabolic acidosis  This is due to worsening renal function.  Continue sodium bicarbonate 1300mg  po twice daily          --Anemia  Iron stores are low for patient with advanced chronic kidney disease.  Receiving iv iron today.  Administer epoetin alfa 66440 units iv weekly.    -- Atrial fibrillation     He is on carvedilol     --UTI  cellulitis           Discussed with the family   Doran Durand, MD  Pager [phone]

## 2023-02-07 NOTE — Discharge Instructions - Pharmacy
 Discharge Summary      Name: Paul Benjamin  Medical Record Number: [id number]        Account Number:  [id number]  Date Of Birth:  1946-01-26                         Age:  77 y.o.  Admit date:  02/01/2023                     Discharge date: 02/07/2023      Discharge Attending:  Legrand Como, DO  Discharge Summary Completed By: Legrand Como, DO    Service: Med Private C- [id number]    Reason for hospitalization:  CKD (chronic kidney disease) [N18.9]    Primary Discharge Diagnosis:   CKD (chronic kidney disease)  #AKI on CKD - improving  #CKD stage 3B  #Polycystic kidney disease, autosomal dominant  #Urinary Incontinence  #Anemia of chronic disease  #UTI complicated  #Congestive Heart Failure, diastolic  #CAD  #Sick sinus syndrome  #Hx of Afib post ablation and s/p pacemaker  #HTN  #Restless legs syndrome  #Gout  #Pain  #T2DM  #Hypothyrodism    Hospital Diagnoses:  Hospital Problems        Active Problems    * (Principal) CKD (chronic kidney disease)     Present on Admission:   CKD (chronic kidney disease)        Significant Past Medical History        Atrial fibrillation (HCC)  CAD (coronary artery disease)  CHF (congestive heart failure) (HCC)  Coronary artery disease (CAD)      Comment:  s/p PCI LAD  HX: anticoagulation      Comment:  Baby aspirin  Hypercholesterolemia  Hypertension  Hypertriglyceridemia  Kidney disease  Obesity  Sleep apnea  SSS (sick sinus syndrome) (HCC)      Comment:  dual chamber PPM  / [employer]  Type II diabetes mellitus (HCC)    Allergies   Ciprofloxacin, Lisinopril, Cefuroxime, Clindamycin, Plavix [clopidogrel], Sulfa (sulfonamide antibiotics), and Quinine    Brief Hospital Course   Paul Benjamin is a 77 y.o. male with a past medical history of congestive heart failure, type 2 diabetes, hypertension, and polycystic kidney disease who presents to the emergency department due to generalized weakness and abnormalities on his recent labs with elevated creatinine of 3.4 on admission.  Patient was found to have AKI and UTI.     #AKI on CKD, most likely obstructive- improving  #CKD stage 3B  #Polycystic kidney disease, autosomal dominant  #Urinary Incontinence  #Anemia of chronic disease  #UTI complicated  - Follows with [facility] nephrology; baseline Cr. Appears to be around 2.7, presented with creatinine 3.4 given his outpatient nephrologist recommended admission for abnormal outpatient kidney function.  Had positive UA with renal ultrasound showing urinary retention and bilateral pyelonephritis.  Patient had Foley catheter placed with Lasix increased to 40 mg daily.  Patient's creatinine initially increased to 4.1 then slowly decreased down to 3.4 then 3.09 and today 2.96.  Patient was given Zosyn for UTI which was later transitioned to Augmentin twice daily with last dose on 11/14.  He was also placed on sodium bicarb 1300 mg twice daily per nephro and patient was recommended to follow-up with urology (already has established with urologist) within 2 weeks of discharge.  In terms of his anemia of chronic disease, patient received EPO on 11/11 and he was given vitamin B12 and folic acid  as well given they were low normal levels.  Patient was evaluated by physical therapy occupational therapy for his weakness and patient qualifies for rehabilitation.  Patient accepted to rehab hospital at [facility] and he and his family wish to go there for further strengthening; therefore, patient was discharged on 11/14.  Patient was not started on Flomax due to some low blood pressure readings; ever, if he maintains his blood pressure in the outpatient setting, he can probably benefit from starting Flomax.     #Cellulitis of lower legs - improving  - Present on lower extremity most notably on L side. Was prescribed doxycyline 100 mg BID 01/29/23, which was continued throughout hospital stay till 11/14.     #Congestive Heart Failure, diastolic  #CAD  #Sick sinus syndrome  #Hx of Afib post ablation and s/p pacemaker  #HTN  - Follows with [facility] cardiology - Dr. Barry Dienes. PTA atorvastatin 40 mg, Coreg 12.5 mg BID, jardiance 10 mg, lasix 20mg  daily Nitroglycerin 0.4 mg SL SUBL. Not on [facility] for afib.  During hospital stay, PTA atorvastatin was continued.  Lasix was increased to 40 mg daily.  Jardiance was held due to AKI.  Coreg was switched to metoprolol succinate 12.5 mg daily given soft blood pressure readings to decrease effect on blood pressure.     #T2DM  -PTA meds are degludec 20 units daily and Jardiance 10 mg daily.  Patient was maintained on low-dose correction factor sliding scale insulin here and his [place] Pepper was held due to AKI.  His glucose range here was fairly controlled between 110 to low 200s.     #Hypothyrodism  Levothyroxine 75 mcg daily was continued     #Restless legs syndrome  Gabapentin 100 mg nightly and pramipexole 0.5 mg nightly were continued.    #Gout  Allopurinol 100 mg twice daily was continued     #Pain  Tramadol 50 mg daily as needed was continued    Items Needing Follow Up   Pending items or areas that need to be addressed at follow up:   Needs follow-up with urology as outpatient    Pending Labs and Follow Up Radiology    Pending labs and/or radiology review at this time of discharge are listed below: Please note- any labs with collected status will not have a result; if this area is blank, there are no items for review.   Pending Labs       Order Current Status    CLEAR TOP EXTRA URINE TUBE In process    POC GLUCOSE In process    POC GLUCOSE In process    UA GREY TOP TUBE In process              Medications      Medication List      START taking these medications     amoxicillin/K clavulanate 500/125 mg tablet; Commonly known as:   AUGMENTIN; Dose: 500 mg; Take one tablet by mouth twice daily with meals   for 2 days. Take with food.  Indications: genitourinary tract infections;   For: genitourinary tract infections; Refills: 0   doxycycline hyclate 100 mg tablet; Commonly known as: VIBRACIN; Dose:   100 mg; Take one tablet by mouth twice daily for 2 doses. Indications:   skin and skin structure infection; For: skin and skin structure infection;   Refills: 0   insulin aspart (U-100) 100 unit/mL (3 mL) PEN; Commonly known as:   NOVOLOG FLEXPEN U-100 INSULIN; Dose: 0-6 Units; Inject zero Units  to six   Units under the skin three times daily with meals. Correction Factor if:   BS 181-220=2 units, BS 221-260=4 units, BS 261-300=6 units, BS 301-350=8   units, BS 351-400=10 units, BS >400=12 units  Indications: type 2 diabetes   mellitus; For: type 2 diabetes mellitus; Refills: 0   metoprolol succinate XL 25 mg extended release tablet; Commonly known   as: TOPROL XL; Dose: 12.5 mg; Take one-half tablet by mouth daily.   Indications: high blood pressure; For: high blood pressure; Refills: 0;   Start taking on: February 08, 2023   polyethylene glycol 3350 17 g packet; Commonly known as: MIRALAX; Dose:   17 g; Take one packet by mouth daily as needed (Constipation - Second   Line). Indications: constipation; For: constipation; Refills: 0   sennosides-docusate sodium 8.6/50 mg tablet; Commonly known as:   SENOKOT-S; Dose: 1 tablet; Take one tablet by mouth daily as needed   (Constipation - First Line). Indications: constipation; For: constipation;   Refills: 0   sodium bicarbonate 650 mg tablet; Dose: 1,300 mg; Take two tablets by   mouth twice daily. Indications: excess body acid; For: excess body acid;   Refills: 0     CONTINUE taking these medications     allopurinoL 100 mg tablet; Commonly known as: ZYLOPRIM; Dose: 100 mg;   Refills: 0   atorvastatin 40 mg tablet; Commonly known as: LIPITOR; Dose: 40 mg; Take   one tablet by mouth daily.; Quantity: 90 tablet; Refills: 3   CHOLEcalciferoL (vitamin D3) 50,000 units capsule; Commonly known as:   OPTIMAL D3; Dose: 50,000 Units; Refills: 0   docusate 100 mg capsule; Commonly known as: COLACE; Dose: 100 mg;   Refills: 0   FREESTYLE LIBRE 3 SENSOR sensor device; Generic drug: blood-glucose   sensor; Dose: 1 each; Refills: 0   furosemide 20 mg tablet; Commonly known as: LASIX; Dose: 40 mg; Take two   tablets by mouth every morning.; Quantity: 180 tablet; Refills: 3   gabapentin 100 mg capsule; Commonly known as: NEURONTIN; Dose: 1   capsule; Refills: 0   levothyroxine 75 mcg tablet; Commonly known as: SYNTHROID; Dose: 75 mcg;   Refills: 0   nitroglycerin 0.4 mg tablet; Commonly known as: NITROSTAT; Dose: 0.4 mg;   Place 1 tablet under tongue every 5 minutes as needed for Chest Pain.;   Quantity: 25 tablet; Refills: 1   OZEMPIC 1 mg/dose (4 mg/3 mL) injection PEN; Generic drug: semaglutide;   Dose: 1 mg; Inject 1 mg under the skin every 7 days.; Quantity: 9 mL;   Refills: 2   pramipexole 1.5 mg tablet; Commonly known as: MIRAPEX; Dose: 1.5 mg;   Refills: 0   traMADoL 50 mg tablet; Commonly known as: ULTRAM; Dose: 1 tablet;   Refills: 0   trospium 20 mg tablet; Commonly known as: SANCTURA; Dose: 20 mg;   Refills: 0     STOP taking these medications     calcium carbonate 500 mg (200 mg elemental calcium) chewable tablet;   Commonly known as: TUMS   carvediloL 12.5 mg tablet; Commonly known as: COREG   doxycycline monohydrate 100 mg capsule; Commonly known as: MONODOX   JARDIANCE 10 mg tablet; Generic drug: empagliflozin   TRESIBA FLEXTOUCH U-100 100 unit/mL (3 mL) subcutaneous PEN; Generic   drug: insulin degludec       Return Appointments and Scheduled Appointments     Scheduled appointments:      Feb 12, 2023 2:00 PM  Injection with  [id number]  Infusion Therapy: [facility]) [phone] [address]  [address]  [place] [address]  [phone]     Feb 14, 2023 2:30 PM  Office visit with Betti Cruz, APRN-NP  Endocrinology: [facility] (Internal Medicine) [address]  [address]  [place] [address]  [phone]     Feb 27, 2023 11:00 AM  Office visit with Doran Durand, MD  Nephrology: Medical [facility] (Internal Medicine) [address]  [address]  [place] [address]  [phone]     May 29, 2023 9:15 AM  Office visit with Raelyn Mora, PA-C  Urology: [facility] (Urology) [address]  [address]  [place] [address]  [phone]     Jul 02, 2023 10:00 AM  Office visit with Vanice Sarah, MD  Cardiovascular Medicine: [facility] (CVM Exam) [address]  [address], [address]  [place] [address]  [phone]          Contact information for after-discharge care                [facility]       [facility]    Phone: [phone]    Fax: [phone]    Where: [address], [place] [address]    Service: Home Nursing, Home Rehabilitation                  Consults, Procedures, Diagnostics, Micro, Pathology   Consults: Nephrology  Surgical Procedures & Dates: None  Significant Diagnostic Studies, Micro and Procedures: noted in brief hospital course  Significant Pathology: noted in brief hospital course              Wound:      Wounds Laceration Anterior;Left;Outer Leg (Active)       Wound Type: Laceration   Orientation: Anterior;Left;Outer   Location: Leg   Wound Location Comments:    Initial Wound Site Closure:    Initial Dressing Placed:    Initial Cycle:    Initial Suction Setting (mmHg):    Pressure Injury Stages:    Pressure Injury Present Within 24 Hours of Hospital Admission:    If This Pressure Injury Is Suspected to Be Device Related, Please Select the Device::    Is the Wound Open or Closed:    Wound Assessment Scab;Dry 02/06/23 0821   Peri-wound Assessment Flaky;Dry;Purple 02/06/23 0821   Wound Drainage Amount None 02/06/23 2052   Wound Dressing Status None/open to air 02/06/23 2052   Number of days:        Wounds Laceration Left;Inferior Leg (Active)   02/02/23 0004   Wound Type: Laceration   Orientation: Left;Inferior   Location: Leg   Wound Location Comments:    Initial Wound Site Closure:    Initial Dressing Placed:    Initial Cycle: Initial Suction Setting (mmHg):    Pressure Injury Stages:    Pressure Injury Present Within 24 Hours of Hospital Admission:    If This Pressure Injury Is Suspected to Be Device Related, Please Select the Device::    Is the Wound Open or Closed:    Wound Assessment Scab 02/06/23 0821   Peri-wound Assessment Dry;Flaky;Purple 02/06/23 0821   Wound Drainage Amount None 02/06/23 2052   Wound Dressing Status None/open to air 02/06/23 2052   Number of days: 5       Wounds Moisture associated skin  damage Anterior;Right;Left;Inner Thigh;Groin (Active)   02/02/23 0005   Wound Type: Moisture associated skin damage   Orientation: Anterior;Right;Left;Inner   Location: Thigh;Groin   Wound Location Comments:    Initial Wound Site Closure:    Initial Dressing Placed:    Initial Cycle:    Initial Suction Setting (mmHg):    Pressure Injury Stages:    Pressure Injury Present Within 24 Hours of Hospital Admission:    If This Pressure Injury Is Suspected to Be Device Related, Please Select the Device::    Is the Wound Open or Closed:    Wound Assessment Pink;Dry 02/06/23 0821   Peri-wound Assessment Pink;Dry 02/06/23 0821   Wound Drainage Amount None 02/06/23 2052   Wound Dressing Status None/open to air 02/06/23 2052   Wound Care Treatment or ointment applied 02/05/23 0854   Wound Dressing and/or Treatment Barrier cream 02/06/23 2052   Number of days: 5                           Discharge Disposition, Condition   Patient Disposition: Rehab Facility (Not [facility]) [62]  Condition at Discharge: Stable    Code Status   Full Code    Patient Instructions     Activity       Activity as Tolerated   As directed      It is important to keep increasing your activity level after you leave the hospital.  Moving around can help prevent blood clots, lung infection (pneumonia) and other problems.  Gradually increasing the number of times you are up moving around will help you return to your normal activity level more quickly.  Continue to increase the number of times you are up to the chair and walking daily to return to your normal activity level. Begin to work toward your normal activity level at discharge          Diet       Renal Diet   As directed      Your diet will need to be low in potassium and phosphorous. Keep track of your sodium intake and limit it to 2000mg  (milligrams) per day.     If you have any questions regarding your diet at home, you may contact a dietitian at [phone].              Discharge education provided to patient., Signs and Symptoms:   Report these signs and symptoms       Report These Signs and Symptoms   As directed      Please contact your doctor if you have any of the following symptoms: temperature higher than 100.4 degrees F, uncontrolled pain, difficulty breathing, chest pain, severe abdominal pain, unable to urinate, or unable to have bowel movement        , Education: , and Others Instructions:   Other Orders       Questions About Your Stay   Complete by: As directed      Discharging attending physician: Legrand Como    Order comments: If you have an emergency after discharge, please dial [phone].    You may contact your discharging physician up to 7 days after discharge for questions about your hospitalization, discharge instructions, or medications by calling [phone] during regular business hours (8AM-4PM) and asking to speak to the doctor listed on the discharge information.       If you are not calling during business hours, ask for the on-call doctor.  If you have new  or worsening symptoms, you may be directed to your primary care provider (PCP) for ongoing questions, an Emergency Department, or an Urgent Care Clinic for a more immediate evaluation.    For all calls or questions more than 7 days after discharge, please contact your primary care provider (PCP).    For medications after discharge: pain (opioid) medicine cannot be refilled or prescribed by calling your discharging physician.  These medications need to be filled by your primary care provider (PCP).  Regular refill requests should be directed to your primary care provider (PCP).            Additional Orders: Case Management, Supplies, Home Health     Home Health/DME       None              Signed:  Darleene Cumpian Al-Baghdadi, DO  02/07/2023      cc:  Primary Care Physician:  [name]       Referring physicians:  No ref. provider found   Additional provider(s):        Did we miss something? If additional records are needed, please fax a request on office letterhead to [phone]. Please include the patient's name, date of birth, fax number and type of information needed. Additional request can be made by email at [email or web address]. For general questions of information about electronic records sharing, call [phone].

## 2023-02-07 NOTE — Case Management (ED)
 Case Management Progress Note    NAME:Paul Benjamin                          MRN: [id number]              DOB:[birth year]          AGE: 77 y.o.  ADMISSION DATE: 02/01/2023             DAYS ADMITTED: LOS: 6 days      Today's Date: 02/07/2023    PLAN: Pt to dc to [facility] @ 3-330    Expected Discharge Date: 02/07/2023   Is Patient Medically Stable: Yes   Are there Barriers to Discharge? no    INTERVENTION/DISPOSITION:  Discharge Planning              Discharge Planning: Home Health    Discussed plan of care with primary team and reviewed EMR. Pt stable for discharge today  SW communicated with [facility] and they can accept today  SW contacted [facility] and organized transport for pt at 3-330PM  SW contacted pt and informed him of his discharge to [facility] Pt was agreeable  SW contacted pt spouse and informed her of pt dc plan. She was agreeable  SW tasked CMA to print transfer packet  SW updated primary team and bedside RN of discharge plan and time  SW will fax orders to facility when available     Transportation              Does the Patient Need Case Management to Arrange Discharge Transport? (ex: facility, ambulance, wheelchair/stretcher, Medicaid, cab, other): No  Will the Patient Use Family Transport?: Yes  Transportation Name, Phone and Availability #1: Spouse to assist with transportation home  Support              Support: Pt/Family Updates re:POC or DC Plan  Info or Referral              Information or Referral to [employer]: No Needs Identified  Positive SDOH Domains and Potential Barriers                   Medication Needs              Medication Needs: No Needs Identified                                                                                                                                          Financial              Financial: No Needs Identified  Legal              Legal: No Needs Identified  Other              Other/None: No needs identified  Discharge Disposition Selected Continued Care - Admitted  Since 02/01/2023       [facility]       Service Provider Services Address Phone Fax Patient Preferred    [facility] [address], [place], [place] [address] [phone] --                      Mertie Moores, LMSW    [phone]

## 2023-02-07 NOTE — Progress Notes
 Reported called to [place] at [facility].

## 2023-02-07 NOTE — Case Management (ED)
CMA Note:       Printed and placed transfer packet in the pt's wall unit per request from Mertie Moores  ,[facility].    Forrestine Him  Case Management Assistant  For additional assistance please contact SWCM

## 2023-02-07 NOTE — Care Plan
 Problem: Skin Integrity  Goal: Skin integrity intact  Outcome: Goal Ongoing  Goal: Healing of skin (Wound & Incision)  Outcome: Goal Ongoing  Goal: Healing of skin (Pressure Injury)  Outcome: Goal Ongoing     Problem: High Fall Risk  Goal: High Fall Risk  Outcome: Goal Ongoing     Problem: Discharge Planning  Goal: Participation in plan of care  Outcome: Goal Ongoing  Goal: Knowledge regarding plan of care  Outcome: Goal Ongoing  Goal: Prepared for discharge  Outcome: Goal Ongoing     Problem: Mobility/Activity Intolerance  Goal: Maximize functional ADL's and mobility outcomes  Outcome: Goal Ongoing

## 2023-02-11 ENCOUNTER — Ambulatory Visit: Admit: 2023-02-11 | Discharge: 2023-02-12 | Payer: MEDICARE

## 2023-02-11 ENCOUNTER — Encounter: Admit: 2023-02-11 | Discharge: 2023-02-11 | Payer: MEDICARE

## 2023-02-11 NOTE — Telephone Encounter
VM received from pt wife  States she had to cancel pt appt later this week  Needs to be put on wait list or rescheduled  Sent to IM Scheduling

## 2023-02-13 ENCOUNTER — Encounter: Admit: 2023-02-13 | Discharge: 2023-02-13 | Payer: MEDICARE

## 2023-02-19 ENCOUNTER — Encounter: Admit: 2023-02-19 | Discharge: 2023-02-19 | Payer: MEDICARE

## 2023-02-19 NOTE — Progress Notes
An application has been submitted to [employer] for [employer].      Orland Penman  Medication Assistance Coordinator   [phone]

## 2023-02-20 ENCOUNTER — Encounter: Admit: 2023-02-20 | Discharge: 2023-02-20 | Payer: MEDICARE

## 2023-02-20 DIAGNOSIS — E1122 Type 2 diabetes mellitus with diabetic chronic kidney disease: Secondary | ICD-10-CM

## 2023-02-24 ENCOUNTER — Encounter: Admit: 2023-02-24 | Discharge: 2023-02-24 | Payer: MEDICARE

## 2023-02-27 ENCOUNTER — Encounter: Admit: 2023-02-27 | Discharge: 2023-02-27 | Payer: MEDICARE

## 2023-02-27 ENCOUNTER — Emergency Department: Admit: 2023-02-27 | Discharge: 2023-02-27 | Disposition: A | Discharge status: Home / Self Care | Payer: MEDICARE

## 2023-02-27 ENCOUNTER — Ambulatory Visit: Admit: 2023-02-27 | Discharge: 2023-02-28 | Payer: MEDICARE

## 2023-02-27 ENCOUNTER — Ambulatory Visit: Admit: 2023-02-27 | Discharge: 2023-02-27 | Payer: MEDICARE

## 2023-02-27 DIAGNOSIS — N3941 Urge incontinence: Secondary | ICD-10-CM

## 2023-02-27 DIAGNOSIS — R339 Retention of urine, unspecified: Secondary | ICD-10-CM

## 2023-02-27 DIAGNOSIS — N1330 Unspecified hydronephrosis: Secondary | ICD-10-CM

## 2023-02-27 NOTE — Progress Notes
Date of Service: 02/27/2023       Subjective:             Paul Benjamin is a 77 y.o. male.      Chief Complaint   Patient presents with    Urinary Retention         History of Present Illness  Very pleasant gentleman w/ multiple medical & cardiovascular co-morbidities & multiple urologic issues.      (+)chronic irritative LUTS, including frequency, urgency, nocturia every hour, and urge urinary incontinence --> typically completely emptying bladder.      Urinary incontinence worse after taking diuretic medication.    Manages urinary incontinence with 1-2 pads/ day which are occasionally soaked.    (+)occasional incomplete emptying sensation.  Denies urinary hesitancy or straining to void.  Denies history of gross hematuria.  Denies history of UTI, pyelonephritis, or prostatitis.    Prior LUTS/ OAB management ?  -- Tamsulosin 0.8 mg q hs --> d/c'd per pt choice.  -- Trospium 20 mg 2x/ d --> d/c'd d/t contraindication while on Potassium citrate.  -- Mirabegron (Myrbetriq) --> cost prohibitive.    =============================================    Interval hx:  Hospitalized at [facility] 02/01/2023 - 02/07/2023, d/t acute on chronic renal insufficiency.    Renal US --> (B) mild hydroureteronephrosis, presumably d/t urinary retention & chronic bladder outlet obstruction (BOO).    Urethral catheter placed (coude cath, per daughter's report).  (?)urine volume emptied from bladder.    Recommended to f/u w/ Urology to discuss possible prostate surgery to shrink prostate.    Presents to Urology Clinic today for further evaluation & possible management options regarding urinary retention.    Presents to clinic w/ his wife & daughter.    Most of HPI obtained from daughter & wife.    States last Foley cath change < 2 wks ago (Nov 2024).    (-)prior midline abd surgery.  (-)prior abd/ pelvic XRT.  (+)distant h/o appendectomy (not ruptured) ~ teenager.        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    amoxicillin/K clavulanate (AUGMENTIN) 500/125 mg tablet Take one tablet by mouth every 12 hours for 7 days. Take with food.    atorvastatin (LIPITOR) 40 mg tablet Take one tablet by mouth daily.    blood-glucose sensor (FREESTYLE LIBRE 3 SENSOR) sensor device Use one each as directed daily.    CHOLEcalciferoL (vitamin D3) (OPTIMAL D3) 50,000 units capsule Take one capsule by mouth every 7 days. Wednesday.    docusate (COLACE) 100 mg capsule Take one capsule by mouth daily.    furosemide (  LASIX) 20 mg tablet Take two tablets by mouth every morning.    gabapentin (NEURONTIN) 100 mg capsule Take one capsule by mouth at bedtime daily.    insulin aspart (U-100) (NOVOLOG FLEXPEN U-100 INSULIN) 100 unit/mL (3 mL) PEN Inject zero Units to six Units under the skin three times daily with meals. Correction Factor if: BS 181-220=2 units, BS 221-260=4 units, BS 261-300=6 units, BS 301-350=8 units, BS 351-400=10 units, BS >400=12 units  Indications: type 2 diabetes mellitus    levothyroxine (SYNTHROID) 75 mcg tablet Take one tablet by mouth daily.    metoprolol succinate XL (TOPROL XL) 25 mg extended release tablet Take one-half tablet by mouth daily. Indications: high blood pressure    nitroglycerin (NITROSTAT) 0.4 mg tablet Place 1 tablet under tongue every 5 minutes as needed for Chest Pain.    polyethylene glycol 3350 (MIRALAX) 17 g packet Take one packet by mouth daily as needed (Constipation - Second Line). Indications: constipation    pramipexole (MIRAPEX) 1.5 mg PO Tab Take one tablet by mouth at bedtime daily.    semaglutide (OZEMPIC) 1 mg/dose (4 mg/3 mL) injection PEN Inject 1 mg under the skin every 7 days.    sennosides-docusate sodium (SENOKOT-S) 8.6/50 mg tablet Take one tablet by mouth daily as needed (Constipation - First Line). Indications: constipation    sodium bicarbonate 650 mg tablet Take two tablets by mouth twice daily. Indications: excess body acid    traMADoL (ULTRAM) 50 mg tablet Take one tablet by mouth as Needed.         Vitals:    02/27/23 0923   BP: (!) 141/80   BP Source: Arm, Right Upper   Pulse: 70   Temp: 36.6 ?C (97.9 ?F)   TempSrc: Temporal   PainSc: Zero   Weight: 93.4 kg (206 lb)   Height: 177.8 cm (5' 10)         Body mass index is 29.56 kg/m?Marland Kitchen       Physical Exam  Constitutional:       Comments: (+)frail appearing.   Genitourinary:     Penis: No erythema, tenderness, discharge, swelling or lesions.       Comments: (+)indwelling urethral silicone catheter (18 Fr).  (-)traumatic hypospadias.  Neurological:      Comments: (+)ambulates w/ walker.              Bladder scan hx:  [place] PVR (05/29/2022) = 47 mL.  [place] PVR (01/09/2022) = 174 mL.  [place] PVR (08/24/2021) = 98 mL.        Labs:  Urine    Lab Results   Component Value Date/Time    UCOLOR Yellow 02/27/2023 02:46 PM    TURBID 2+ (A) 02/27/2023 02:46 PM    USPGR 1.013 02/27/2023 02:46 PM    UPH 6.0 02/27/2023 02:46 PM    UPROTEIN 2+ (A) 02/27/2023 02:46 PM    UAGLU Negative 02/27/2023 02:46 PM    UKET Negative 02/27/2023 02:46 PM    UBILE Negative 02/27/2023 02:46 PM    UBLD 3+ (A) 02/27/2023 02:46 PM    UROB Normal 02/27/2023 02:46 PM    Lab Results   Component Value Date/Time    UNIT Positive (A) 02/27/2023 02:46 PM    ULEU 3+ (A) 02/27/2023 02:46 PM    UWBC Packed (A) 02/27/2023 02:46 PM    URBC Packed (A) 02/27/2023 02:46 PM            Creatinine & eGFR hx (x last 10):  Creatinine  Date Value Ref Range Status   02/27/2023 2.41 (H) 0.40 - 1.24 mg/dL Final   16/12/9602 5.40 (H)  Final   02/07/2023 2.96 (H) 0.40 - 1.24 mg/dL Final   98/01/9146 8.29 (H) 0.40 - 1.24 mg/dL Final   56/21/3086 5.78 (H) 0.40 - 1.24 mg/dL Final   46/96/2952 8.41 (H) 0.40 - 1.24 mg/dL Final   32/44/0102 7.25 (H) 0.40 - 1.24 mg/dL Final   36/64/4034 7.42 (H) 0.4 - 1.24 MG/DL Final   59/56/3875 6.43 (H) 0.4 - 1.24 MG/DL Final   32/95/1884 1.66 (H)  Final     eGFR   Date Value Ref Range Status   02/02/2023 18 (L) >60 mL/min Final     Comment:     eGFR calculated using the CKD-EPIcr_R equation   02/01/2023 16 (L) >60 mL/min Final     Comment:     eGFR calculated using the CKD-EPIcr_R equation   01/18/2023 18 (L) >60 mL/min Final     Comment:     eGFR calculated using the CKD-EPIcr_R equation   09/20/2022 25 (L) >60 mL/min Final     Comment:     eGFR calculated using the CKD-EPIcr_R equation   06/20/2022 25 (L) >60 mL/min Final     Comment:     eGFR calculated using the CKD-EPIcr_R equation   12/05/2020 34 (L) >60 mL/min Final     Comment:     eGFR calculated using the CKD-EPIcr_R equation   09/12/2020 30 (L) >60 mL/min Final     Comment:     eGFR calculated using the CKD-EPIcr_R equation   03/21/2020 34 (L) >60 mL/min Final     Comment:     eGFR calculated using the CKD-EPIcr_R equation         Imaging hx:  US Renal/ Bladder (02/02/2023):  1.  Mild bilateral hydronephrosis. No renal stones.   2.  Layering debris in the bladder.   3.  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   No hydronephrosis.         Assessment and Plan:    Problem   Urinary retention, chronic   Hydronephrosis, Bilateral   Urinary incontinence, urge (UUI)   Fragile X Syndrome         Urinary retention, chronic  Discussed urinary retention management options ...  -- voiding trial today w/ possible ability to go back to spontaneous voiding, likely recurrent urge urinary incontinence (UUI) & functional urinary incontinence, possible recurrent incomplete bladder emptying & renal function problems.  -- possible bladder outlet procedure/ surgery --> I believe there is a high-risk of worse urinary incontinence after possible prostate surgery.  ---- if considering possible prostate surgery, he would need prostate imaging to determine prostate size/ volume, urodynamic study (UDS), & possible cysto.  ---- I am concerned his underlying cardiac & pulmonary conditions may preclude a more extensive surgical options  -- SP cath placement  ---- there are still some surgical & anesthesia risks, but possibly more feasible than other BPH surgical options.  ---- discussed possible improved quality of life w/ chronic cath management d/t decreased irritative LUTS & urinary incontinence.  -- chronic urethral cath management.  After careful consideration, he wishes to keep urethral catheter for now and pursue possible SP cath placement.  -- RTC 3-4 wks Nurse Clinic --> Urethral cath exchange (18 Fr silicone  or 18 Fr coude)  -- Referral (new pt) to attending urology MD Littie Deeds, [place], [place])    Hydronephrosis, bilateral  Repeat Renal US ~ 3-4 wks.  --  will contact pt w/ result.    Urinary incontinence, urge (UUI)  See urinary retention A&P note.        Orders Placed This Encounter    US RENAL BLADDER COMPLETE         Marin Roberts, PA-C  Urology      > 35 min of today's 40 min appt spent in direct face-to-face consultation & coordination of care.

## 2023-02-28 ENCOUNTER — Encounter: Admit: 2023-02-28 | Discharge: 2023-02-28 | Payer: MEDICARE

## 2023-02-28 NOTE — Assessment & Plan Note
Repeat Renal US ~ 3-4 wks.  --  will contact pt w/ result.

## 2023-02-28 NOTE — Assessment & Plan Note
See urinary retention A&P note.

## 2023-03-04 ENCOUNTER — Encounter: Admit: 2023-03-04 | Discharge: 2023-03-04 | Payer: MEDICARE

## 2023-03-04 ENCOUNTER — Ambulatory Visit: Admit: 2023-03-04 | Discharge: 2023-03-04 | Payer: MEDICARE

## 2023-03-04 DIAGNOSIS — I10 Essential (primary) hypertension: Secondary | ICD-10-CM

## 2023-03-04 DIAGNOSIS — E1122 Type 2 diabetes mellitus with diabetic chronic kidney disease: Secondary | ICD-10-CM

## 2023-03-04 DIAGNOSIS — I2510 Atherosclerotic heart disease of native coronary artery without angina pectoris: Secondary | ICD-10-CM

## 2023-03-04 NOTE — Progress Notes
Date of Service: 03/04/2023    Subjective:         Paul Benjamin is a 77 y.o. male. He is here today with his wife, [name].    Diabetes    The patient presents to the [facility] at [facility] for evaluation and management of diabetes mellitus. He was last seen by me in June 2024.    Since his last visit he was hospitalized from November 8-14, 2024 for treatment of renal decline. His Jardiance was discontinued. He was DC'd to rehab and DC'd home on PTA carvedilol instead of Metoprolol, at home he had severe hypotension and was seen in the ED on February 27, 2023.    He has been following a renal diet. Since our visit in June 2024 he has lost about 8-10 lbs. He has been taking his tresiba only once per week or less. In the hospital he did not receive basal insulin-- had a little correction novolog after day 4.    His libre 3 sensor ended yesterday and has not received more sensor yet. He misplaced his glucose meter and has been unable to check his glucose today. Glucose in clinic was 86 and he felt shaky-- given juice.    He continues to receive his Ozempic, and tresiba through [employer] patient assistance.     He currently has a UTI, now on Keflex, has a foley catheter, but is needing a suprapubic catheter placed, will be discussing with urology in February 2025.       Type 2 Diabetes mellitus  Dx: ~2010     A1c today in clinic: 5.9%  POC glucose: 86     Current DM regimen: Tresiba 20 units every morning 0-2 days/ week and Ozempic 1 mg every Friday  Past treatment: Glimepiride- stopped during hospitalization, Jardiance- renal decline, Novolog, Jardiance- hypotension  Adherence to medications: all the time  FSBG frequency: FreeStyle Libre 2  Hyperglycemia: occasionally post prandial   Hypoglycemia: when he takes insulin  Hypoglycemia unawareness?: no  Meals per day / Carb intake: 3 and occasional snack  Exercise: Home Health PT     Complications of DM:  CAD: YES, with ablation and stenting, and pacemaker, HF with preserved EF  CVA: No  PVD: No  Amputations: No  Retinopathy: No  Gastropathy: No  Nephropathy: YES  Neuropathy: No  Depression: No  Chronic wounds / delayed healing: YES, has had recent cellulitis--August 2020  DM related hospitalizations: No     Last dilated eye exam: December 2021  Last dental exam: upcoming visit August 2022  Last DM education / nutritionist visit:      DM related medications:  Statin: Yes, atorvastatin 40 mg  ACE-I: No  ASA: No     Continuous Glucose Monitoring Analysis and Interpretation    Indication for Device Placement: Large Fluctuations in daily pre-prandial blood sugar values    Name/Type of Device Placed: FreeStyle Libre    Date of Print out: 03/04/23    Continuous glucose monitor downloaded and personally reviewed by me on 03/04/23.               Review of Systems  14 point review of systems was positive for weight loss    Objective:          allopurinol (ZYLOPRIM) 100 mg tablet Take one tablet by mouth twice daily.    amoxicillin/K clavulanate (AUGMENTIN) 500/125 mg tablet Take one tablet by mouth every 12 hours for 7 days. Take with food.  atorvastatin (LIPITOR) 40 mg tablet Take one tablet by mouth daily.    blood-glucose sensor (FREESTYLE LIBRE 3 SENSOR) sensor device Use one each as directed daily.    cephalexin (KEFLEX) 500 mg capsule Take one capsule by mouth every 12 hours for 7 days.    CHOLEcalciferoL (vitamin D3) (OPTIMAL D3) 50,000 units capsule Take one capsule by mouth every 7 days. Wednesday.    docusate (COLACE) 100 mg capsule Take one capsule by mouth daily.    furosemide (LASIX) 20 mg tablet Take two tablets by mouth every morning.    gabapentin (NEURONTIN) 100 mg capsule Take one capsule by mouth at bedtime daily.    insulin aspart (U-100) (NOVOLOG FLEXPEN U-100 INSULIN) 100 unit/mL (3 mL) PEN Inject zero Units to six Units under the skin three times daily with meals. Correction Factor if: BS 181-220=2 units, BS 221-260=4 units, BS 261-300=6 units, BS 301-350=8 units, BS 351-400=10 units, BS >400=12 units  Indications: type 2 diabetes mellitus    levothyroxine (SYNTHROID) 75 mcg tablet Take one tablet by mouth daily.    metoprolol succinate XL (TOPROL XL) 25 mg extended release tablet Take one-half tablet by mouth daily. Indications: high blood pressure    nitroglycerin (NITROSTAT) 0.4 mg tablet Place 1 tablet under tongue every 5 minutes as needed for Chest Pain.    polyethylene glycol 3350 (MIRALAX) 17 g packet Take one packet by mouth daily as needed (Constipation - Second Line). Indications: constipation    pramipexole (MIRAPEX) 1.5 mg PO Tab Take one tablet by mouth at bedtime daily.    semaglutide (OZEMPIC) 1 mg/dose (4 mg/3 mL) injection PEN Inject 1 mg under the skin every 7 days.    sennosides-docusate sodium (SENOKOT-S) 8.6/50 mg tablet Take one tablet by mouth daily as needed (Constipation - First Line). Indications: constipation    sodium bicarbonate 650 mg tablet Take two tablets by mouth twice daily. Indications: excess body acid (Patient not taking: Reported on 03/04/2023)    traMADoL (ULTRAM) 50 mg tablet Take one tablet by mouth as Needed. (Patient not taking: Reported on 03/04/2023)     Vitals:    03/04/23 1434   BP: 124/81   BP Source: Arm, Right Upper   Pulse: 88   Temp: 36.6 ?C (97.9 ?F)   Resp: 18   TempSrc: Oral   Weight: 95.3 kg (210 lb)   Height: 177.8 cm (5' 10)     Body mass index is 30.13 kg/m?Marland Kitchen      Telehealth Patient Reported Vitals       Row Name 03/04/23 1434                Patient Position Sitting        Resp 18        BP Source Arm, Right Upper                        Physical Exam  Constitutional:       Appearance: Normal appearance.   Pulmonary:      Effort: Pulmonary effort is normal.   Skin:     General: Skin is warm and dry.      Comments: Skin tear on bilateral lower extremities + redness and warmth   Neurological:      Mental Status: He is alert and oriented to person, place, and time.   Psychiatric:         Mood and Affect: Mood normal.  Behavior: Behavior normal.         Thought Content: Thought content normal.              Assessment and Plan:  Diabetes mellitus type 2, controlled   A1c today in clinic: 5.9%  Target A1c < 6.5-7.0%  without hypoglycemia  Currently on Tresiba 20 units every morning 0-2 days/ week and Ozempic 1 mg every Friday  Complicated by: Nephropathy and CAD     Plan:  Patient with tight glucose control  Stop Evaristo Bury  Continue Ozempic 1 mg  Jardiance stopped due to hypotension  Continue Libre 3 plus  Provided with [employer] Guide Me glucose meter, will send supplies to use as back up when Josephine Igo is malfunctioning or if waiting for shipment  F/u with clinical pharmacist in 4 weeks to make sure glucose control remains stable off Tresiba, may consider restarting at 10 units daily if needed.     Diabetic complication assessment:   Annual Dilated eye exam:   Annual labs (electrolytes and renal function): Reviewed, last done today: June 2024, following with nephrology  Annual Urine microalbumin/Cr: N/A CKD  Foot exam / monofilament exam (recommended annually): January 2024     Supportive care for diabetes:  Diabetic Educator (recommended annually):   Nutritionist:           Lipids: Hyperlipidemia    Recommendations for Statin treatment in diabetes:   34-75 yo: No risk factors - moderate dose statin.  CVD risk factors or overt CVD - high dose statin.    CVD risk factors include LDL cholesterol >=100 mg/dL (2.6 mmol/L), high blood pressure, and overweight   Currently on a statin: atorvastatin 40 mg  Annual fasting lipid panel completed: November 2024     Blood pressure: Hypertension   Controlled   ACE-I or ARB: No        RTC 4 months with me in person at [facility]             Patient Instructions   It was nice to see you today!    Goals we discussed today:     Stop taking Evaristo Bury   (If your sugar is staying up you may take 10 units of Tresiba)    Continue Ozempic 1 mg each week    Continue FreeStyle Libre 3    You could donate your insulin to [facility]: [phone]      If you fill a prescription and it's your last refill please let us know in a MyChart message so we can refill this before you run out    If you have any immediate concerns after 4pm, weekends, or holidays please contact the endocrinologist on call: [phone], and ask for the endocrinologist on call to be paged    Please contact [facility] for any questions or abnormal glucose values (above 300 or less than 70 repeatedly).  [phone]   My nurse, Alexa, can be reached at [phone]        Health goals for a person with diabetes to avoid complications are:    Check your FingerStick blood glucose:  Each time you take medications for your glucose, goals are:   Fasting and pre-meal glucose:  70-130 mg/dl   Bedtime blood glucose: 90-180 mg/dl    LDL Cholesterol:  <027 mg/dl   Triglycerides: <253 mg/dl   HDL >66 for men, >44 for women   Blood pressure:  Less than 130/90 mm HG  Maintain a Healthy Weight ,  Exercise: 30 minutes 5 times per week    Foot care:  Take good care of your feet.  Never cut your nails close to the tips of the toes. Promptly call your doctor if you have an infection, ulcer or cut anywhere on your feet. Wear shoes with a wide toe box and that fit comfortably.  Avoid walking barefoot.     Your recent lab values:    Glucose, POC   Date/Time Value Ref Range Status   03/04/2023 02:41 PM 87 70 - 100 Final     Cholesterol   Date/Time Value Ref Range Status   01/31/2023 12:00 AM 84  Final     HDL   Date/Time Value Ref Range Status   01/31/2023 12:00 AM 23  Final     LDL   Date/Time Value Ref Range Status   01/31/2023 12:00 AM 39  Final     Triglycerides   Date/Time Value Ref Range Status   01/31/2023 12:00 AM 114  Final                    [facility] Classes and Education Opportunities in 2023-2024:             One-on-one education with a Certified Diabetes Care and Education Specialist or Dietitian( In-person or Telehealth Visit)    * Personal education tailored your needs. 1 hour initial visit; 30-minute follow-ups; various days/times     Offered at multiple locations:      [employer], [place] 5th Floor: [address], [place], [place] [address]  [facility]: [address] [place], [place] [address]  [facility]: [address], [place], [place] [address]    [facility] 2024    Diabetes Self-Management Program: Group classes Comprehensive diabetes education designed for those that are newly diagnosed or those in need of a refresher. In group discussion format using [employer] - need doctor prescription and insurance approval.  Four classes or eight classes held over two months.  Patients are encouraged to schedule a one-on-one with a dietitian during this time as well for help creating a personalized meal plan.    Monday Evening Classes 6-8pm  (Telehealth group)    Jan-Feb 2024 Mar-Apr 2024  May-Jun 2024  Aug-Sep 2024  Oct-Nov 2024  Class 1: Jan 8 Class 1: Mar 11  Class 1: May 6  Class 1: Aug 12  Class 1: Oct 7    Class 2: Jan 22 Class 2: Mar 25  Class 2: May 20  Class 2: Aug 26  Class 2: Oct 21  Class 3: Feb 12 Class 3: Apr 8  Class 3: Jun 10  Class 3: Sep 9  Class 3: Nov 4  Class 4: Feb 26 Class 4: Apr 22  Class 4: Jun 24  Class 4: Sep 23  Class 3: Nov 18       Tuesday Afternoon Classes 12- 1 pm  (1 hr of 8 classes) (Telehealth group)    Jan-Feb 2024     Mar-Apr 2024    May-Jun 2024    Class 1: Jan 9    Class 5: Feb 6 Class 1: Mar 5   Class 5: Apr 2  Class 1: May 7   Class 5: Jun 4    Class 2: Jan 16  Class 6: [id number] Class 2: Mar 12   Class 6: Apr 9  Class 2: May 14  Class 6: Jun 11  Class 3: Jan 23  Class 7: Feb 20 Class 3: Mar 19   Class 7: Apr 16 Class 3: May 21   Class 7: Jun 18    Class 4: Jan 30  Class 8: Feb 27 Class 4: Mar 26   Class 8: Apr 23 Class 4: May 28   Class 8: Jun 25      Aug-Sep 2024    Oct-Nov 2024      Class 1: Aug  6    Class 5: Sep 3  Class 1: Oct 1   Class 5: Oct 29   Class 2: Aug 13   Class 6: Sep 10  Class 2: Oct 8   Class 6: Nov 5   Class 3: Aug 20   Class 7: Sep 17  Class 3: Oct 15   Class 7: Nov 12   Class 4: Aug 27   Class 8: Sep 24  Class 4: Oct 22   Class 8: Nov 19        *There is a charge for this visit and class. Most insurance companies require a referral from your doctor. Contact your insurance company to determine coverage for diabetes education.        To schedule Diabetes Education please call [phone]           Free Online Programs 2023    How to register for our [facility] at [email or web address]  Register for classes 48 hours in advance by emailing [email or web address] or calling [phone]  Register via [facility]: [email or web address]    You will receive Zoom link after registration.  (All classes are subject to change, and you will be notified of any changes after the registration)    Monthly Diabetes Survival Skills: Providing basic survival skills class with information tailored to meet your needs. It's also great for people who want to refresh their diabetes knowledge after completing diabetes group classes-- Every 1st Tue  of each month from 1.30-2.30 pm (*except Jul)  Date offered:   Nov 7  Dec 5       Monthly Cooking with [facility]:  [employer] with [employer] offering healthy, easy home cooking recipes by registered dietitian who is specialized in diabetes and weight management-- Every 4th Wed of each month from 12-1 pm (*except Jun and Dec)  Date offered: Oct 25  Nov 22  Dec 20*    Monthly Diabetes Support Group : Ongoing diabetes education & support for people with diabetes and family-- Every 1st Wed of each month, 6-7:30 pm   Date offered: Nov 1  Dec 6    Monthly Ani-inflammatory Diet: Learn how food can help your body have an appropriate inflammatory response and lower chronic inflammation. Learn principles of choosing foods, practical meal planning tools and tips for grocery shopping-- Every 3rd Tue of each month from 1.30-2.30 pm   Date offered:  Oct 17  Nov 21  Dec 19     Introduction to Low Carb and Keto Diet Plans in Diabetes Management: Learn if the low Carb or Keto Diet Plan is appropriate for you in helping with diabetes management -Every 2 months on 3rd Mon from 12-1 pm  Date offered:  Oct 16  Dec 18    FREE In-person HEALTHY COOKING CLASS on Wednesday February 07, 2022, from 1.30-3.30 PM at [employer]: center For [facility] [address], [place], [place] [address]   Register Free Here    Moving for Better Health: Come learn about  current exercise recommendations, health benefits of exercise, and how to get started with goal setting. Bring your questions, barriers to exercise and share helpful tips for how you have implemented exercise into your life.  Date offered:  Oct 31    Stress and Your Health: We will discuss the various causes of stress, how stress impacts health and healthy coping strategies to reduce stress.  Date offered: Nov 21    Sleep and Your Health: We will discuss the importance of sleep, health consequences of inadequate sleep and tips for how to improve sleep quality and quantity.  Date offered: Dec 19           Free Online Programs 2024                                              Monthly Diabetes Survival Skills: Providing basic survival skills class with information tailored to meet your needs. It's also great for people who want to refresh their diabetes knowledge after completing diabetes group classes-- Every 3rd Wednesday of each month from 12-1 PM   Date offered:   Jan 17  Feb 21  Mar 20  Apr 17  May 15  Jun 19     Jul 17   Aug 21  Sep 18  Oct 16  Nov 20  Dec 18      Prediabetes and Diabetes Prevention: Learn about lifestyle and diet changes to prevent type II diabetes and decrease insulin resistance--on selected Tuesdays from 12-1 PM  Date offered:  Jan 9   Feb 13  Mar 12  Apr 9   May 14    Jun 11  Jul 9   Sep 10  Nov 12    Nutritional Supplement Class: Learn how targeted nutritional supplementation can help support your health and wellness goals. We will review supplemental strategies that may benefit conditions such as cardiovascular disease, diabetes, and gastrointestinal health. Learn how to identify good quality supplements so you can be more confident about choosing the right brands. - Selected Tue from 1:30-2:45 pm  Date offered:  Feb 6  March 5 April 2 May 7    June 4 Aug 6  Oct 1  Dec 3  Type 1 Diabetes Meet Up Group: Ongoing diabetes education & support specifically for people with type 1 diabetes and family--Selected Wed from 5.30- 6.30 pm   Date offered: Mar 6  May 8  Sep 4  Nov 6    Diabetes Support Group: Ongoing diabetes education & support for people with diabetes and family--Selected Wed from 5.30 - 7pm    Date offered: Feb 7  Apr 3  Jun5  Aug 7  Oct 2  Dec 4     Virtual Cooking with [facility]:  Secretary/administrator with [employer] offering healthy, easy home cooking recipes by registered dietitian who is specialized in diabetes and weight management. -- on selected Wed from 12-1 PM    Date offered: Feb 28        Apr 24         May 29           Jul 24       Aug 28    Oct 23  Nov 27    In-person Healthy Cooking Classes: This cooking class provides cooking tips and skills to help you make healthy, easy home cooking  recipes at home.  The class is led by experienced registered dietitians who are specialized in diabetes care, weight management and nutrition related chronic conditions--Time 1-3 PM       Date offered:  Mar 6  Jun 5  Sep 4  Dec 11      Introduction to Low Carb and Keto Diet Plans in Diabetes Management: Learn if the low Carb or Keto Diet Plan is appropriate for you in helping with diabetes management -On selected Mon from 12-1 pm  Date offered:  Mar 18  Jun 17  Sep 16  Dec 16    Ani-inflammatory Diet: Learn how food can help your body have an appropriate inflammatory response and lower chronic inflammation. Learn principles of choosing foods, practical meal planning tools and tips for grocery shopping list-- Selected Tue from 1.30-2.45 pm   Date offered:  Jan 16  Mar 19  May 21 July 16  Sep 17  Nov 19     The Wellness Series: Regardless of your current health, everyone can benefit from knowing how to maintain healthy blood sugar from diet and lifestyle and using these tools for life. The series will return in summer of 2024. Watch recorded classes at [email or web address]        Future Appointments   Date Time Provider Department Center   03/25/2023  8:30 AM MPA2 UROLOGY CL NURSE [facility] Urology   03/25/2023 11:30 AM [facility]OGR[facility]HY[facility]   04/17/2023 11:00 AM Doran Durand, MD [facility] IM   04/24/2023  9:30 AM Leanne Lovely, PHARMD [facility] IM   05/22/2023  2:00 PM Sherron Monday, MD [facility] Urology   07/02/2023 10:00 AM Vanice Sarah, MD [facility] CVM Exam   08/01/2023  2:00 PM Betti Cruz, APRN-NP [facility] IM                              40 minutes spent on this patient's encounter with counseling and coordination of care taking >50% of the visit.

## 2023-03-05 ENCOUNTER — Encounter: Admit: 2023-03-05 | Discharge: 2023-03-05 | Payer: MEDICARE

## 2023-03-05 DIAGNOSIS — Z794 Long term (current) use of insulin: Secondary | ICD-10-CM

## 2023-03-05 DIAGNOSIS — E118 Type 2 diabetes mellitus with unspecified complications: Secondary | ICD-10-CM

## 2023-03-12 ENCOUNTER — Encounter: Admit: 2023-03-12 | Discharge: 2023-03-12 | Payer: MEDICARE

## 2023-03-13 ENCOUNTER — Encounter: Admit: 2023-03-13 | Discharge: 2023-03-13 | Payer: MEDICARE

## 2023-03-18 ENCOUNTER — Encounter: Admit: 2023-03-18 | Discharge: 2023-03-18 | Payer: MEDICARE

## 2023-03-18 ENCOUNTER — Inpatient Hospital Stay: Admit: 2023-03-18 | Discharge: 2023-03-18 | Payer: MEDICARE

## 2023-03-18 ENCOUNTER — Encounter: Admit: 2023-03-18 | Discharge: 2023-03-19 | Payer: MEDICARE

## 2023-03-18 VITALS — BP 119/76 | HR 76

## 2023-03-18 VITALS — BP 109/68 | HR 76

## 2023-03-18 VITALS — BP 105/67 | HR 80

## 2023-03-18 VITALS — BP 108/94 | HR 79

## 2023-03-18 VITALS — BP 97/75 | HR 70 | Temp 97.50000°F | Ht 70.0 in | Wt 210.0 lb

## 2023-03-18 VITALS — HR 81

## 2023-03-18 VITALS — BP 107/68 | HR 71

## 2023-03-18 VITALS — BP 116/67 | HR 75

## 2023-03-18 VITALS — BP 102/71

## 2023-03-18 VITALS — BP 101/59 | HR 82 | Temp 97.70000°F

## 2023-03-18 VITALS — BP 120/74 | HR 78

## 2023-03-18 VITALS — BP 102/71 | HR 76 | Temp 97.50000°F | Ht 70.0 in | Wt 210.0 lb

## 2023-03-18 VITALS — BP 111/81 | HR 75 | Temp 96.20000°F

## 2023-03-18 VITALS — HR 75

## 2023-03-18 DIAGNOSIS — R579 Shock, unspecified: Secondary | ICD-10-CM

## 2023-03-18 DIAGNOSIS — D649 Anemia, unspecified: Secondary | ICD-10-CM

## 2023-03-18 DIAGNOSIS — N390 Urinary tract infection, site not specified: Secondary | ICD-10-CM

## 2023-03-18 LAB — IONIZED CALCIUM: ~~LOC~~ BKR IONIZED CALCIUM: 1.2 mmol/L — ABNORMAL LOW (ref 1.00–1.30)

## 2023-03-18 LAB — HIGH SENSITIVITY TROPONIN I 4 HR: ~~LOC~~ BKR HIGH SENSITIVITY TROPONIN I 4 HOUR: 147 ng/L — ABNORMAL HIGH (ref <20)

## 2023-03-18 LAB — ECG 12-LEAD
P AXIS: 75 degrees — ABNORMAL LOW (ref 0.00–0.45)
P-R INTERVAL: 336 ms — ABNORMAL HIGH (ref 0–2)
Q-T INTERVAL: 458 ms — ABNORMAL LOW (ref 1.00–4.80)
QRS DURATION: 98 ms (ref 1.80–7.00)
QTC CALCULATION (BAZETT): 515 ms — ABNORMAL HIGH (ref 0.00–0.80)
R AXIS: -35 degrees (ref 0.00–0.20)
T AXIS: -36 degrees — ABNORMAL HIGH (ref <=20.6)
VENTRICULAR RATE: 76 {beats}/min — ABNORMAL LOW (ref 21.0–28.0)

## 2023-03-18 LAB — URINALYSIS DIPSTICK REFLEX TO CULTURE
~~LOC~~ BKR GLUCOSE,UA: NEGATIVE /HPF — AB
~~LOC~~ BKR NITRITE: NEGATIVE
~~LOC~~ BKR URINE BILE: NEGATIVE
~~LOC~~ BKR URINE KETONE: NEGATIVE /HPF — AB
~~LOC~~ BKR URINE PH: 6 /HPF — AB (ref 5.0–8.0)

## 2023-03-18 LAB — CBC AND DIFF
~~LOC~~ BKR HEMATOCRIT: 30 % — ABNORMAL LOW (ref 40.0–50.0)
~~LOC~~ BKR MCHC: 32 g/dL — ABNORMAL HIGH (ref 32.0–36.0)
~~LOC~~ BKR WBC COUNT: 5.6 10*3/uL (ref 4.50–11.00)

## 2023-03-18 LAB — MAGNESIUM: ~~LOC~~ BKR MAGNESIUM: 2.3 mg/dL — ABNORMAL LOW (ref 1.6–2.6)

## 2023-03-18 LAB — LACTIC ACID (BG - RAPID LACTATE): ~~LOC~~ BKR LACTIC ACID(SYRINGE): 1.1 mmol/L — ABNORMAL HIGH (ref 0.5–2.0)

## 2023-03-18 LAB — BLOOD GASES, ARTERIAL
~~LOC~~ BKR O2 SAT-ART: 97 % — ABNORMAL LOW (ref 95.0–99.0)
~~LOC~~ BKR PH-ART: 7.3 mg/dL — ABNORMAL HIGH (ref 7.35–7.45)

## 2023-03-18 LAB — PHOSPHORUS: ~~LOC~~ BKR PHOSPHORUS: 3.5 mg/dL — ABNORMAL LOW (ref 2.0–4.5)

## 2023-03-18 LAB — URINALYSIS MICROSCOPIC REFLEX TO CULTURE

## 2023-03-18 LAB — POC GLUCOSE: ~~LOC~~ BKR POC GLUCOSE: 135 mg/dL — ABNORMAL HIGH (ref 70–100)

## 2023-03-18 LAB — PTT (APTT): ~~LOC~~ BKR PTT: 54 s — ABNORMAL HIGH (ref 24.0–36.5)

## 2023-03-18 LAB — PROTIME INR (PT): ~~LOC~~ BKR PROTIME: 14 s — ABNORMAL HIGH (ref 10.2–12.9)

## 2023-03-18 MED ORDER — ASPIRIN 81 MG PO CHEW
81 mg | Freq: Every day | ORAL | 0 refills | Status: DC
Start: 2023-03-18 — End: 2023-03-26
  Administered 2023-03-19 – 2023-03-26 (×8): 81 mg via ORAL

## 2023-03-18 MED ORDER — POLYETHYLENE GLYCOL 3350 17 GRAM PO PWPK
1 | Freq: Every day | ORAL | 0 refills | Status: DC | PRN
Start: 2023-03-18 — End: 2023-03-20

## 2023-03-18 MED ORDER — DIPHENHYDRAMINE HCL 50 MG/ML IJ SOLN
25 mg | INTRAVENOUS | 0 refills | Status: DC | PRN
Start: 2023-03-18 — End: 2023-03-21

## 2023-03-18 MED ORDER — PRAMIPEXOLE 1 MG PO TAB
1.5 mg | Freq: Every evening | ORAL | 0 refills | Status: DC
Start: 2023-03-18 — End: 2023-03-23
  Administered 2023-03-19 – 2023-03-23 (×10): 1.5 mg via ORAL

## 2023-03-18 MED ORDER — SODIUM CHLORIDE 0.9 % IJ SOLN
10 mL | Freq: Once | INTRAVENOUS | 0 refills | Status: CP
Start: 2023-03-18 — End: ?
  Administered 2023-03-18: 10 mL via INTRAVENOUS

## 2023-03-18 MED ORDER — SODIUM CHLORIDE 0.9% IV SOLP
INTRAVENOUS | 0 refills | Status: DC
Start: 2023-03-18 — End: 2023-03-19

## 2023-03-18 MED ORDER — ATORVASTATIN 40 MG PO TAB
40 mg | Freq: Every day | ORAL | 0 refills | Status: DC
Start: 2023-03-18 — End: 2023-03-19

## 2023-03-18 MED ORDER — HEPARIN (PORCINE) BOLUS FOR CONTINUOUS INFUSION (VIAL) - APTT MAIN
20-40 [IU]/kg | INTRAVENOUS | 0 refills | Status: DC | PRN
Start: 2023-03-18 — End: 2023-03-19

## 2023-03-18 MED ORDER — INSULIN ASPART 100 UNIT/ML SC FLEXPEN
0-6 [IU] | Freq: Before meals | SUBCUTANEOUS | 0 refills | Status: DC
Start: 2023-03-18 — End: 2023-03-24
  Administered 2023-03-19: 22:00:00 4 [IU] via SUBCUTANEOUS

## 2023-03-18 MED ORDER — GABAPENTIN 100 MG PO CAP
100 mg | Freq: Every evening | ORAL | 0 refills | Status: DC
Start: 2023-03-18 — End: 2023-03-26
  Administered 2023-03-19 – 2023-03-26 (×8): 100 mg via ORAL

## 2023-03-18 MED ORDER — HEPARIN (PORCINE) IN 5 % DEX 20,000 UNIT/500 ML (40 UNIT/ML) IV SOLP
0-2000 [IU]/h | INTRAVENOUS | 0 refills | Status: DC
Start: 2023-03-18 — End: 2023-03-19
  Administered 2023-03-18: 23:00:00 1000 [IU]/h via INTRAVENOUS

## 2023-03-18 MED ORDER — SODIUM CHLORIDE 0.9% IV SOLP
INTRAVENOUS | 0 refills | Status: AC
Start: 2023-03-18 — End: ?
  Administered 2023-03-19 (×2): 1000.0000 mL via INTRAVENOUS

## 2023-03-18 MED ORDER — NOREPINEPHRINE BITARTRATE-D5W 4 MG/250 ML (16 MCG/ML) IV SOLN
0-.5 ug/kg/min | INTRAVENOUS | 0 refills | Status: DC
Start: 2023-03-18 — End: 2023-03-20
  Administered 2023-03-18: 20:00:00 0.03 ug/kg/min via INTRAVENOUS
  Administered 2023-03-19: 06:00:00 0.05 ug/kg/min via INTRAVENOUS

## 2023-03-18 MED ORDER — CEFEPIME 2 GRAM IVP
2 g | INTRAVENOUS | 0 refills | Status: DC
Start: 2023-03-18 — End: 2023-03-18

## 2023-03-18 MED ORDER — CEFEPIME 2 GRAM IVP
2 g | INTRAVENOUS | 0 refills | Status: DC
Start: 2023-03-18 — End: 2023-03-20
  Administered 2023-03-19 (×2): 2 g via INTRAVENOUS

## 2023-03-18 MED ORDER — ATORVASTATIN 40 MG PO TAB
80 mg | Freq: Once | ORAL | 0 refills | Status: CP
Start: 2023-03-18 — End: ?
  Administered 2023-03-19: 03:00:00 80 mg via ORAL

## 2023-03-18 MED ORDER — LEVOTHYROXINE 75 MCG PO TAB
75 ug | Freq: Every day | ORAL | 0 refills | Status: DC
Start: 2023-03-18 — End: 2023-03-26
  Administered 2023-03-19 – 2023-03-26 (×8): 75 ug via ORAL

## 2023-03-18 MED ORDER — DEXTROSE 50 % IN WATER (D50W) IV SYRG
12.5-25 g | INTRAVENOUS | 0 refills | Status: DC | PRN
Start: 2023-03-18 — End: 2023-03-24

## 2023-03-18 MED ORDER — HEPARIN (PORCINE) 1,000 UNIT/ML IJ SOLN
70 [IU]/kg | Freq: Once | INTRAVENOUS | 0 refills | Status: CP
Start: 2023-03-18 — End: ?
  Administered 2023-03-18: 23:00:00 6670 [IU] via INTRAVENOUS

## 2023-03-18 MED ORDER — PERFLUTREN LIPID MICROSPHERES 1.1 MG/ML IV SUSP
1-10 mL | Freq: Once | INTRAVENOUS | 0 refills | Status: CP | PRN
Start: 2023-03-18 — End: ?
  Administered 2023-03-18: 2 mL via INTRAVENOUS

## 2023-03-18 MED ORDER — TICAGRELOR 90 MG PO TAB
90 mg | Freq: Two times a day (BID) | ORAL | 0 refills | Status: DC
Start: 2023-03-18 — End: 2023-03-26
  Administered 2023-03-19 – 2023-03-26 (×15): 90 mg via ORAL

## 2023-03-18 MED ORDER — SENNOSIDES-DOCUSATE SODIUM 8.6-50 MG PO TAB
1 | Freq: Two times a day (BID) | ORAL | 0 refills | Status: DC | PRN
Start: 2023-03-18 — End: 2023-03-20

## 2023-03-18 MED ORDER — ASPIRIN 325 MG PO TAB
325 mg | Freq: Once | ORAL | 0 refills | Status: DC
Start: 2023-03-18 — End: 2023-03-19

## 2023-03-18 MED ORDER — DIPHENHYDRAMINE HCL 25 MG PO CAP
25 mg | ORAL | 0 refills | Status: DC | PRN
Start: 2023-03-18 — End: 2023-03-21

## 2023-03-18 MED ORDER — HEPARIN, PORCINE (PF) 5,000 UNIT/0.5 ML IJ SYRG
5000 [IU] | SUBCUTANEOUS | 0 refills | Status: DC
Start: 2023-03-18 — End: 2023-03-26
  Administered 2023-03-19 – 2023-03-21 (×6): 5000 [IU] via SUBCUTANEOUS

## 2023-03-18 MED ORDER — PHENTOLAMINE 5 MG IJ SOLR
10 mg | Freq: Once | SUBCUTANEOUS | 0 refills | Status: CP
Start: 2023-03-18 — End: ?
  Administered 2023-03-19: 03:00:00 10 mg via SUBCUTANEOUS

## 2023-03-18 MED ORDER — CLOTRIMAZOLE 1 % TP CREA
Freq: Two times a day (BID) | TOPICAL | 0 refills | Status: DC
Start: 2023-03-18 — End: 2023-03-26
  Administered 2023-03-19 – 2023-03-24 (×3): via TOPICAL

## 2023-03-18 MED ORDER — HEPARIN, PORCINE (PF) 5,000 UNIT/0.5 ML IJ SYRG
5000 [IU] | SUBCUTANEOUS | 0 refills | Status: DC
Start: 2023-03-18 — End: 2023-03-18

## 2023-03-18 MED ORDER — ONDANSETRON HCL (PF) 4 MG/2 ML IJ SOLN
4 mg | INTRAVENOUS | 0 refills | Status: DC | PRN
Start: 2023-03-18 — End: 2023-03-26

## 2023-03-18 MED ORDER — ALLOPURINOL 100 MG PO TAB
100 mg | Freq: Two times a day (BID) | ORAL | 0 refills | Status: DC
Start: 2023-03-18 — End: 2023-03-23
  Administered 2023-03-19 – 2023-03-23 (×10): 100 mg via ORAL

## 2023-03-18 NOTE — Progress Notes
 Pre-Procedure Airway Assessment     Planned Procedure: RHC/LHC, cor angio, possible PCi    Time of last oral intake: Yesterday    Assessment: Airway assessment performed and No abnormalities    Note: If any factors present an anesthesia consult should be considered    Malampatti: III    ASA Class: ASA III (A patient with a severe systemic disease that limits activity, but is not incapacitating)    Physician has discussed risks and alternatives of this type sedation and above planned procedure(s) with: Patient    Allergies:   Allergies   Allergen Reactions    Ciprofloxacin RASH    Lisinopril SEE COMMENTS     hyperkalemia    Cefuroxime HIVES     Tolerated augmentin Aug 2024 and zosyn in Nov 2024    Clindamycin RASH    Plavix [Clopidogrel] RASH    Sulfa (Sulfonamide Antibiotics) HIVES    Quinine SEE COMMENTS     Allergy recorded in SMS: QUININE        Current Medications: Reviewed    Appropriate labs/diagnostic tests: Reviewed    Have the patient or anyone in the patient's family ever had a history of sedation/anesthesia complications? No

## 2023-03-18 NOTE — Consults
 CARDIOLOGY CONSULT NOTE                         Date of Admit: 03/18/2023  Date of service: 03/18/2023 5:44 PM      Reason for Consult:  Elevated troponin    History of Present Illness  Paul Benjamin is a 77 y.o. male with PMH remote hx of persistent a fib requiring ablation 2009 and no recurrent afib (not on AC), hx tachy-brady syndrome requiring dual PPM in 2008 ([employer]), CAD s/p PCI to LAD in 2008, HFpEF, CKD (BL Cr ~2.5), HTN, T2DM, and HLD  who was admitted 03/18/2023 after presenting to an outside facility with chest pain and increasing fatigue with subsequent transferred to [facility].  The patient confirms that he was having severe chest pain last night and this morning, associated with some dyspnea.    He was also recently treated for a catheter associated UTI with Keflex, which is suspected to have not been treated fully.  On presentation, he is noted to have soft blood pressures ranging in the 90s over 70s on vasopressor support with norepinephrine. He is requiring 3 L of oxygen supplementation.  His creatinine on presentation is around his baseline of 2.7 with no significant electrolyte disturbances.  His hemoglobin is 9.8 which is around his baseline.  His troponins were noted to be significantly elevated at 12,600, increasing to 14,700.    EKG on 03/18/23:  Unable to identify clear P waves.  This could be an accelerated junctional Darsh Vandevoort.  HR 72.  Diffuse T wave inversions.    Most recent 2D ECHO performed on admission:  Moderate reduced LV systolic function EF estimated 35 to 40%.  Wall motion abnormality described below.  Wall motion is suggestive of possible LAD territory injury/ischemia plus minus multivessel disease.  No LV thrombus identified on the current study.  The RV does appear to be moderately to severely dilated.  Mild RV systolic dysfunction.  More focal distal free wall hypokinesis appreciated.    RA/RV pacer wire identified.  Mild tricuspid valve regurgitation.  Known bicuspid sclerotic aortic valve.  Fusion of the left and right coronary cusp.  Visually leaflet excursion is limited.    Peak velocity 1.4 m/s, mean gradient 4 mmHg.  DVI 0.43.  Stroke-volume index: 25 mL/m?.  Mildly dilated aortic root/sinus and ascending thoracic aorta measuring 4 cm and 4.2 cm respectively.      Most recent stress test performed  05/03/2015 - Stress Test:    This study is probably normal with inferior attenuation with a small sized mild intensity reversible defect in the basal inferior wall consistent with  diaphragmatic attenuation. Left ventricular systolic function is normal. There are no high risk prognostic indicators present.  The ECG portion of the study is negative for ischemia.     Most recent left heart catheterization performed  01/21/04 Heart catheterization - Angioplasty and stenting of the anterior descending coronary artery on 01/21/04 with a Cypher drug-eluting stent used for the LAD. The right coronary and circumflex showed only minimal disease. The ejection fraction was 45-50%.     Chest X-Ray on admission:  Left subclavian dual-chamber pacemaker noted.   No consolidation, pleural effusion or pneumothorax.   Heart is stable in size.  Old healed left rib fractures.     Pertinent labs:  Troponin 40981, 14792  BNP 11787      Assessment & Recs   Active Problem List  Suspect Type I MI, NSTEMI  AHRF  Shock, cardiogenic vs septic -possible source UTI vs cellulitis  Significant RV dilation  hx tachy-brady syndrome requiring dual PPM in 2008 ([employer])  CAD s/p PCI to LAD in 2008  HFpEF  Remote history of Persistent a fib requiring ablation 2009 and no recurrent afib (not on ACCKD (BL Cr ~2.5)  HTN, T2DM, and HLD    Patient with extensive cardiac history as detailed above presents and shock requiring norepinephrine and with newly reduced EF in the setting of significantly elevated troponin levels.  While his RV is dilated and pulmonary embolism is an consideration, an ACS process will need to be ruled out first. Sepsis is still within the differential for the patient's presentation, and possible sources may include a UTI and cellulitis.     PLAN/Recommendations:  Plan for urgent left and right heart catheterization for better characterization of coronaries and cardiac index.  The patient reportedly received aspirin loading dose at OSH.  Give heparin loading dose. Will give atorvastatin 80mg  dose now.  The patient is allergic to Plavix and lisinopril.  Based on coronary angiogram and RHC, the patient may need to get admitted to the CCU.   Optimize electrolytes with Mg >2 and K >4.   Antibiotics per primary team and further cares to follow post-cath.    We shall continue to follow along.      Thank you for allowing [place] to participate in the care of this patient. Discussed with Dr. Laverda Sorenson who agrees with plan above.  Please see the separate attestation by attending for any changes in the management plans. After 5PM please call Cardiology fellow on call. Monday - Friday 8AM-5PM please call Cardiology consult pager.    Demontrae Gilbert Geryl Councilman, MD  Fellow, Cardiovascular Medicine  Pager [phone]         Home Medications  Medications Prior to Admission   Medication Sig    allopurinol (ZYLOPRIM) 100 mg tablet Take one tablet by mouth twice daily.    atorvastatin (LIPITOR) 40 mg tablet Take one tablet by mouth daily.    blood-glucose sensor (FREESTYLE LIBRE 3 SENSOR) sensor device Use one each as directed daily.    CHOLEcalciferoL (vitamin D3) (OPTIMAL D3) 50,000 units capsule Take one capsule by mouth every 7 days. Wednesday.    docusate (COLACE) 100 mg capsule Take one capsule by mouth daily.    furosemide (LASIX) 20 mg tablet Take two tablets by mouth every morning.    gabapentin (NEURONTIN) 100 mg capsule Take one capsule by mouth at bedtime daily.    insulin aspart (U-100) (NOVOLOG FLEXPEN U-100 INSULIN) 100 unit/mL (3 mL) PEN Inject zero Units to six Units under the skin three times daily with meals. Correction Factor if: BS 181-220=2 units, BS 221-260=4 units, BS 261-300=6 units, BS 301-350=8 units, BS 351-400=10 units, BS >400=12 units  Indications: type 2 diabetes mellitus    levothyroxine (SYNTHROID) 75 mcg tablet Take one tablet by mouth daily.    metoprolol succinate XL (TOPROL XL) 25 mg extended release tablet Take one-half tablet by mouth daily. Indications: high blood pressure    nitroglycerin (NITROSTAT) 0.4 mg tablet Place 1 tablet under tongue every 5 minutes as needed for Chest Pain.    polyethylene glycol 3350 (MIRALAX) 17 g packet Take one packet by mouth daily as needed (Constipation - Second Line). Indications: constipation    pramipexole (MIRAPEX) 1.5 mg PO Tab Take one tablet by mouth at bedtime daily.    semaglutide (OZEMPIC) 1 mg/dose (4 mg/3 mL) injection PEN Inject  1 mg under the skin every 7 days.    sennosides-docusate sodium (SENOKOT-S) 8.6/50 mg tablet Take one tablet by mouth daily as needed (Constipation - First Line). Indications: constipation    sodium bicarbonate 650 mg tablet Take two tablets by mouth twice daily. Indications: excess body acid (Patient not taking: Reported on 03/04/2023)    traMADoL (ULTRAM) 50 mg tablet Take one tablet by mouth as Needed. (Patient not taking: Reported on 03/04/2023)       Current Medications  Scheduled Meds:allopurinoL (ZYLOPRIM) tablet 100 mg, 100 mg, Oral, BID  aspirin tablet 325 mg, 325 mg, Oral, ONCE  [START ON 03/19/2023] atorvastatin (LIPITOR) tablet 40 mg, 40 mg, Oral, QDAY  cefepime (MAXIPIME) IVP 2 g, 2 g, Intravenous, Q24H*  gabapentin (NEURONTIN) capsule 100 mg, 100 mg, Oral, QHS  [START ON 03/19/2023] levothyroxine (SYNTHROID) tablet 75 mcg, 75 mcg, Oral, QDAY  pramipexole (MIRAPEX) tablet 1.5 mg, 1.5 mg, Oral, QHS  SODIUM CHLORIDE 0.9% IV SOLP (Cabinet Override), , , NOW  SODIUM CHLORIDE 0.9% IV SOLP (Cabinet Override), , , NOW    Continuous Infusions:   heparin (porcine) 20,000 units/D5W 500 mL infusion (std conc)(premade) 1,000 Units/hr (03/18/23 1716)    norepinephrine (LEVOPHED) 4 mg in dextrose 5% (D5W) 250 mL IV drip (std conc) 0.03 mcg/kg/min (03/18/23 1429)    sodium chloride 0.9 %   infusion       PRN and Respiratory Meds:heparin (porcine) TITRATE **AND** heparin (porcine) Q6H PRN       Allergies  Allergies   Allergen Reactions    Ciprofloxacin RASH    Lisinopril SEE COMMENTS     hyperkalemia    Cefuroxime HIVES     Tolerated augmentin Aug 2024 and zosyn in Nov 2024    Clindamycin RASH    Plavix [Clopidogrel] RASH    Sulfa (Sulfonamide Antibiotics) HIVES    Quinine SEE COMMENTS     Allergy recorded in SMS: QUININE       Past Medical History  Past Medical History:    Atrial fibrillation (HCC)    CAD (coronary artery disease)    CHF (congestive heart failure) (HCC)    Coronary artery disease (CAD)    HX: anticoagulation    Hypercholesterolemia    Hypertension    Hypertriglyceridemia    Kidney disease    Obesity    Sleep apnea    SSS (sick sinus syndrome) (HCC)    Type II diabetes mellitus (HCC)       Past Surgical History  Surgical History:   Procedure Laterality Date    PACEMAKER PLACEMENT  09/30/2006    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    Performed by Kathreen Cornfield, MD at [facility]    Removal Permanent Pacemaker N/A 01/17/2016    Performed by Kathreen Cornfield, MD at [facility]    BURR HOLE      CARDIAC CATHERIZATION      CORONARY STENT PLACEMENT      HX APPENDECTOMY         Social History  Social History     Socioeconomic History    Marital status: Married   Occupational History     Employer: [employer]     Comment: retired   Tobacco Use    Smoking status: Former     Current packs/day: 0.00     Average packs/day: 1 pack/day for 4.0 years (4.0 ttl pk-yrs)     Types: Cigarettes     Start date: 03/26/1962  Quit date: 03/26/1966     Years since quitting: 57.0    Smokeless tobacco: Never   Vaping Use    Vaping status: Never Used   Substance and Sexual Activity    Alcohol use: No     Comment: rarely    Drug use: Never Sexual activity: Never       Family History  Family History   Problem Relation Name Age of Onset    Asthma Father Jomarie Longs     Cardiovascular Father Jomarie Longs     Hypertension Mother Malena Catholic     High Cholesterol Mother Malena Catholic        Review of Systems    10 point ROS completed and negative except as noted per HPI.                         Vital Signs: Most Recent                 Vital Signs: 24 Hour Range   BP: 97/75 (12/23 1600)  Temp: 36.4 ?C (97.5 ?F) (12/23 1600)  Pulse: 70 (12/23 1600)  Respirations: 13 PER MINUTE (12/23 1600)  SpO2: 100 % (12/23 1600)  O2 Device: Nasal cannula (12/23 1600)  O2 Liter Flow: 3 Lpm (12/23 1600)  Height: 177.8 cm (5' 10) (12/23 1600) BP: (97-109)/(68-75)   Temp:  [36.4 ?C (97.5 ?F)]   Pulse:  [70-76]   Respirations:  [13 PER MINUTE-16 PER MINUTE]   SpO2:  [99 %-100 %]   O2 Device: Nasal cannula  O2 Liter Flow: 3 Lpm     Vitals:    03/18/23 1400 03/18/23 1600   Weight: 95.3 kg (210 lb) 95.3 kg (210 lb)       Intake/Output Summary (Last 24 hours) at 03/18/2023 1744  Last data filed at 03/18/2023 1400  Gross per 24 hour   Intake --   Output 200 ml   Net -200 ml           Physical Exam     General Appearance: calm, frail, in distress  Eyes: conjunctivae and lids normal, EOMI  Neck:  some JVD  Cardiovascular system: regular, S1 S2 normal  Respiratory system: Bilateral breath sounds, normal rate.  Extremities: no peripheral edema, some erythema noted  Abdomen: soft, non-tender  Neurologic: AAOx4, no obvious focal deficits noted    Lab/Radiology/Other Diagnostic Tests:  24-hour labs:    Results for orders placed or performed during the hospital encounter of 03/18/23 (from the past 24 hours)   MAGNESIUM    Collection Time: 03/18/23  2:15 PM   Result Value Ref Range    Magnesium 2.3 1.6 - 2.6 mg/dL   PHOSPHORUS    Collection Time: 03/18/23  2:15 PM   Result Value Ref Range    Phosphorus 3.5 2.0 - 4.5 mg/dL   COMPREHENSIVE METABOLIC PANEL    Collection Time: 03/18/23  2:15 PM   Result Value Ref Range    Sodium 145 137 - 147 mmol/L    Potassium 3.9 3.5 - 5.1 mmol/L    Chloride 115 (H) 98 - 110 mmol/L    Glucose 119 (H) 70 - 100 mg/dL    Blood Urea Nitrogen 62 (H) 7 - 25 mg/dL    Creatinine 4.78 (H) 0.40 - 1.24 mg/dL    Calcium 8.8 8.5 - 29.5 mg/dL    Total Protein 6.1 6.0 - 8.0 g/dL    Total Bilirubin 0.4 0.2 - 1.3 mg/dL  Albumin 3.2 (L) 3.5 - 5.0 g/dL    Alk Phosphatase 87 25 - 110 U/L    AST 75 (H) 7 - 40 U/L    ALT 31 7 - 56 U/L    CO2 20 (L) 21 - 30 mmol/L    Anion Gap 10 3 - 12    Glomerular Filtration Rate (GFR) 23 (L) >60 mL/min   LACTIC ACID (BG - RAPID LACTATE)    Collection Time: 03/18/23  2:15 PM   Result Value Ref Range    Lactic Acid,BG 1.1 0.5 - 2.0 mmol/L   IONIZED CALCIUM    Collection Time: 03/18/23  2:15 PM   Result Value Ref Range    Ionized Calcium 1.25 1.00 - 1.30 mmol/L   PROCALCITONIN    Collection Time: 03/18/23  2:15 PM   Result Value Ref Range    Procalcitonin 0.33 ng/mL   NT-PRO-BNP    Collection Time: 03/18/23  2:15 PM   Result Value Ref Range    NT-Pro-BNP 11,787 (H) <450 pg/mL   HIGH SENSITIVITY TROPONIN I 0 HOUR    Collection Time: 03/18/23  2:15 PM   Result Value Ref Range    hs Troponin I 0 Hour 12,690 (H) <20 ng/L   CBC AND DIFF    Collection Time: 03/18/23  2:16 PM   Result Value Ref Range    White Blood Cells 5.60 4.50 - 11.00 10*3/uL    Red Blood Cells 3.49 (L) 4.40 - 5.50 10*6/uL    Hemoglobin 9.8 (L) 13.5 - 16.5 g/dL    Hematocrit 45.4 (L) 40.0 - 50.0 %    MCV 86.7 80.0 - 100.0 fL    MCH 28.2 26.0 - 34.0 pg    MCHC 32.5 32.0 - 36.0 g/dL    RDW 09.8 (H) 11.9 - 15.0 %    Platelet Count 181 150 - 400 10*3/uL    MPV 7.1 7.0 - 11.0 fL    Neutrophils 80 (H) 41 - 77 %    Lymphocytes 9 (L) 24 - 44 %    Monocytes 9 4 - 12 %    Eosinophils 1 0 - 5 %    Basophils 1 0 - 2 %    Absolute Neutrophil Count 4.50 1.80 - 7.00 10*3/uL    Absolute Lymph Count 0.50 (L) 1.00 - 4.80 10*3/uL    Absolute Monocyte Count 0.50 0.00 - 0.80 10*3/uL    Absolute Eosinophil Count 0.10 0.00 - 0.45 10*3/uL    Absolute Basophil Count 0.10 0.00 - 0.20 10*3/uL    MDW (Monocyte Distribution Width) 24.4 (H) <=20.6   BLOOD GASES, ARTERIAL    Collection Time: 03/18/23  2:23 PM   Result Value Ref Range    pH 7.38 7.35 - 7.45    pCO2-Arterial 30 (L) 35 - 45 mmHg    pO2-Arterial 189 (H) 80 - 100 mmHg    Base Deficit-Arterial 6.1 mmol/L    O2 SAT-Arterial 97.9 95.0 - 99.0 %    Bicarbonate-ART-Cal 19.4 (L) 21.0 - 28.0 mmol/L    FiO2 Value     URINALYSIS DIPSTICK REFLEX TO CULTURE    Collection Time: 03/18/23  3:43 PM    Specimen: Midstream; Urine   Result Value Ref Range    Color,UA Amber     Turbidity,UA 2+ (A) Clear    Specific Gravity-Urine 1.013 1.005 - 1.030    pH,UA 6.0 5.0 - 8.0    Protein,UA 3+ (A) Negative    Glucose,UA Negative Negative  Ketones,UA Negative Negative    Bilirubin,UA Negative Negative    Blood,UA 2+ (A) Negative    Urobilinogen,UA Normal Normal    Nitrite,UA Negative Negative    Leukocytes,UA 2+ (A) Negative   URINALYSIS MICROSCOPIC REFLEX TO CULTURE    Collection Time: 03/18/23  3:43 PM    Specimen: Midstream; Urine   Result Value Ref Range    WBCs,UA Packed (A) None, 0 - 2  /HPF    RBCs,UA 20 - 50 (A) None, 0 - 2  /HPF    Mucous,UA Trace None, Trace /LPF    Bacteria,UA Moderate (A) None /HPF    WBC Clumps Present (A) None /HPF   HIGH SENSITIVITY TROPONIN I 4 HR    Collection Time: 03/18/23  3:47 PM   Result Value Ref Range    hs Troponin I 4 Hour 14,792 (H) <20 ng/L   PROTIME INR (PT)    Collection Time: 03/18/23  4:05 PM   Result Value Ref Range    Protime 14.1 (H) 10.2 - 12.9 Seconds    INR 1.3 (H) 0.9 - 1.2   PTT (APTT)    Collection Time: 03/18/23  4:05 PM   Result Value Ref Range    APTT 54.6 (H) 24.0 - 36.5 Seconds   POC GLUCOSE    Collection Time: 03/18/23  5:32 PM   Result Value Ref Range    Glucose, POC 135 (H) 70 - 100 mg/dL     Glucose: (!) 027 (25/36/64 1415)  POC Glucose (Download): (!) 135 (03/18/23 1732)

## 2023-03-18 NOTE — H&P (View-Only)
 CICU History and Physical Examination      Name: Paul Benjamin        Birthday: 1945-08-28                                MRN: [id number]    Admission Date: 03/18/2023                                                LOS: 0 days      Brief Hospital Course     Paul Benjamin is a 77 y.o. man with a past medical history of AD polycystic kidney disease, CKD IV, DMT2, anemia of chronic disease, SSS s/p dual chamber PPM (2008), afib s/p ablation (2009), HTN, CAD s/p stenting to LAD (2005), dyslipidemia, OSA compliant with CPAP, and diastolic dysfunction who presented to [facility] as a transfer from [facility] and was found to be in shock, possibly multifactorial (cardiogenic due to occlusion of RCA with possible urosepsis) and was taken to cath lab for intervention.      Assessment & Plan     Principal Problem:    Shock (HCC)  Active Problems:    CAD (coronary artery disease)    Mixed dyslipidemia    Sleep apnea    Hypovitaminosis D    CKD stage 4 due to type 2 diabetes mellitus (HCC)    Controlled type 2 diabetes mellitus with complication, with long-term current use of insulin (HCC)    Overactive bladder (OAB)    Urinary incontinence, urge (UUI)    Anemia of renal disease    History of gout    Hypothyroid    Hx of heart artery stent        NEURO/PSYCH  #Restless Leg Syndrome  - PTA Medications: Pramipexole 1.5mg  qHS and gabapentin 100mg  qHS  Plan:  > Continue PTA pramipexole 1.5mg  qHS and gabapentin 100mg  qHS      PULMONARY  #OSA, compliant with CPAP  - Admission ABG: 7.38/30/189  - Admission CXR: No consolidation, pleural effusion, or pneumothorax  Plan:  > Patient's wife brought his CPAP from home, and he would prefer to use that rather than a hospital machine. Order placed.      CARDIOVASCULAR  #Shock, likely multifactorial with septic and cardiogenic components  #CAD s/p PCI to LAD and proximal portion of diagonal branch 2005  #SSS s/p dual chamber PPM 2008  #Afib s/p ablation 2009  #Mixed dyslipidemia  - Pressors: Off pressors since arriving to unit from cath lab. Previously on norepi going into cath  - Admission EKG: Sinus rhythm with first degree AV block and left axis deviation  - Post cath EKG: Sinus rhythm with first degree AV block and left axis deviation.   - Admission Echo: moderately reduced LV systolic function with EF 30-35% (previously 56% 01/2023); new regional wall motion abnormalities (akinesis of inferoseptal and inferolateral walls and apical segments; hypokinesis of basal inferoseptal, basal inferior, mid anteroseptal, and mid inferior walls); moderately to severely dilated RV with mild RV systolic dysfunction; known bicuspid aortic valve  - Trop: 3 (OSH); 12,690 (admission); 14,792 (4 hours post admission)  - 12/23 PCI to RCA  - PTA meds: Atorvastatin 40mg  qHS, furosemide 40mg  qAM, metoprolol succinate 12.5mg  daily, and sublingual nitro  Plan:  > Increase atorvastatin to 80mg   qHS. Lipid profile in 01/2023 indicates LDL is 39.   > Holding PTA furosemide and metoprolol succinate for now given recent shock state and need for norepi. I suspect he will need spot dosing of furosemide tomorrow given mild edema in bilateral LE on exam. GDMT can likely be resumed tomorrow at low doses if he remains hemodynamically stable overnight given newly reduced EF.  > Start aspirin 81mg  daily and ticagrelor 90mg  BID. Patient has an allergy to Plavix, and his family reports he had been placed on Warfarin as a result, but this management strategy is not clear in his records. Office visit notes from 2005 and 2006 indicate he stayed on aspirin and Plavix post stenting for 6 months and was then placed on Warfarin for atrial fibrillation after coming off DAPT.  > For concern of septic shock, see ID section below.    FEN/GI  #Hx constipation  #Hypovitaminosis D  - PTA Medications:Vitamin D3 50,000U weekly,  Miralax PRN, and Senokot PRN  Plan:  > Will hold PTA vitamin D3 for now (last dose 12/18). PTA PRN Miralax and Senokot-S are available for patient.  > Nutrition: Clear liquid post cath with nursing care team to advance as tolerated.      Nutrition:  Malnutrition Details:                                        RENAL  #AD Polycystic Kidney Disease  #CKD IV  #Chronic indwelling foley  - Baseline creatinine 2.5-3.5  - Indwelling foley replaced 03/18/23. Plans to get suprapubic catheter placed in February 2025  Recent Labs     03/18/23  1415   CR 2.71*   BUN 62*   GFR 23*     - Net IO Since Admission: -303.07 mL [03/18/23 2113]  -   Intake/Output Summary (Last 24 hours) at 03/18/2023 2113  Last data filed at 03/18/2023 1700  Gross per 24 hour   Intake 26.93 ml   Output 330 ml   Net -303.07 ml     Plan:  > replace electrolytes PRN based on daily AM CMP, mag, and phos checks  > Will monitor kidney function with daily AM CMP  > Is receiving 1L NS post cath for hydration and flushing of kidneys.      ENDOCRINE  #DMT2 with long-term insulin use  #Hypothyroidism  - 09/2021 A1c 6.2  - 01/2023 TSH 2.85  Recent Labs     03/18/23  1732   GLUPOC 135*     - PTA Medications: Levothyroxine qAM, Ozempic 1mg  weekly (last dose on 12/20), and aspart LDCF   Plan:  > Repeat A1c pending  > POC blood glucose monitoring ordered ACHS with LDCF.  > Continue PTA levothyroxine qAM.  > Holding PTA Ozempic      HEME/ONC  #Anemia of chronic disease  - Baseline hemoglobin 9.5-10.5  Recent Labs     03/18/23  1416   HGB 9.8*   MCV 86.7   PLTCT 181     - Admisison hemoglobin 9.8.  Plan:  > Monitor HGB with daily AM CBC  > Transfuse if Hgb <7 or <8 with active bleeding  > DVT Ppx: Heparin subq      ID  #UTI  #Possible cellulitis  #Intertrigo complicated by candidal infection  - Admission UA showing packed WBCs, 20-50 RBCs, moderate bacteria but was nitrite negative. 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  - Admission CXR without signs of consolidation  Recent Labs     03/18/23  1416   WBC 5.60     - Temp (24hrs), Avg:36.2 ?C (97.1 ?F), Min:35.7 ?C (96.2 ?F), Max:36.4 ?C (97.5 ?F)    - 03/18/23 Blood Cx: Pending  - 03/18/2023 Urine Cx: Pending  - Antibiotics:   Meropenem (12/23 single dose at outside hospital)   Cefepime (12/23 - current)  Plan:  > Respiratory culture ordered by MICU before transfer to CICU has not been collected. Will not cancel just yet but expect it will be low yield.  > Clotrimazole topical cream BID for candidal intertrigo  > Wound team consulted by MICU before transfer to CICU. Will not cancel consult at this time.  > Continue cefepime ordered by MICU with plans to de-escalate as cultures return and clinical course is assessed.   > Unclear if patient has a true cellulitis. While he does have an area of increased erythema and warmth to the right lower extremity, it is not tender to palpation.       MSK/DERM  #Gout  #Venous stasis changes  - PTA Medications: allopurinol 100mg  BID  Wounds:  Active Wounds                             Plan:  > Continue PTA allopurinol 100mg  BID    LDA/PROPHYLAXIS  Lines: PIV x3  Tubes: Right radial sheath  Urinary Catheter:  Yes; Retain foley due to:  Urinary retention  Antibiotic Usage:  Yes; Infection present or suspected:  Wound/Skin/Tissue;  Cellulitis and Candidal intertrigo  GU/GI;  Urinary tract infection (UTI)  VTE ppx: SCDs, subq heparin  GI:  Not indicated  Bowel regimen: miralax and senna  Diet: Diet Clear Liquid  Advance Diet as Tolerated  Code status: FULL CODE    Patient discussed with fellow, Dr. Lester Carolina, who has communicated with Dr. Maisie Fus. Will be formally staffed in the morning.    Forde Radon, MD  Internal Medicine, PGY2      _________________________________________________________________________  Subjective     History of Present Illness: [facility] is a 77 y.o. male with a past medical history of AD polycystic kidney disease, CKD IV, DMT2, anemia of chronic disease, SSS s/p dual chamber PPM (2008), afib s/p ablation (2009), HTN, CAD s/p stenting to LAD (2005), dyslipidemia, OSA compliant with CPAP, and diastolic dysfunction who presented to [facility] as a transfer from [facility] and was found to be in suspected cardiogenic shock due to occlusion of RCA and was taken to cath lab for intervention.    When he presented to [facility], he had been having off and on chest tightness since the night before. He had told his wife he was having heart burn but did not tell her about the chest tightness. He says, he didn't really think about the tightness being related to his heart. A visiting nurse came on 12/23 and found him to be hypotensive, and it was recommended he be evaluated at the ER. He had also been having some increased discomfort related to his foley catheter.     He was found to be hypotensive with no leukocytosis or fever and was started on Levophed before transfer to [place] [facility] for concern of septic shock related to a UTI. He was also given 324mg  aspirin, 1L fluid, and 1g meropenem. Additional laboratory information showed troponin elevation  to 3.933 and lactate elevation to 2.8.      ROS:   Review of Systems   Constitutional:  Negative for chills, fever and malaise/fatigue.   HENT:  Negative for congestion, hearing loss, sinus pain and tinnitus.    Eyes:  Negative for blurred vision, double vision, photophobia and pain.   Respiratory:  Negative for cough, sputum production and shortness of breath.    Cardiovascular:  Positive for leg swelling (at baseline). Negative for chest pain and orthopnea.   Gastrointestinal:  Negative for abdominal pain, blood in stool, constipation, diarrhea, nausea and vomiting.   Genitourinary:  Positive for dysuria and urgency.   Musculoskeletal:  Negative for back pain, joint pain, myalgias and neck pain.   Skin:  Positive for rash (Groin).   Neurological:  Negative for tremors, focal weakness and weakness.   Psychiatric/Behavioral:  The patient is not nervous/anxious.         The patient  has a past medical history of Atrial fibrillation (HCC), CAD (coronary artery disease) (09/30/2006), CHF (congestive heart failure) (HCC) (2010), Coronary artery disease (CAD), anticoagulation, Hypercholesterolemia, Hypertension, Hypertriglyceridemia, Kidney disease, Obesity, Sleep apnea, SSS (sick sinus syndrome) (HCC), and Type II diabetes mellitus (HCC).    The patient  has a past surgical history that includes pacemaker placement (09/30/2006); coronary stent placement; appendectomy; [facility]; and Cardiac catheterization.    Social History     Tobacco Use    Smoking status: Former     Current packs/day: 0.00     Average packs/day: 1 pack/day for 4.0 years (4.0 ttl pk-yrs)     Types: Cigarettes     Start date: 03/26/1962     Quit date: 03/26/1966     Years since quitting: 57.0    Smokeless tobacco: Never   Vaping Use    Vaping status: Never Used   Substance Use Topics    Alcohol use: No     Comment: rarely    Drug use: Never     family history includes Asthma in his father; Cardiovascular in his father; High Cholesterol in his mother; Hypertension in his mother.    Allergies:  Ciprofloxacin, Lisinopril, Cefuroxime, Clindamycin, Plavix [clopidogrel], Sulfa (sulfonamide antibiotics), and Quinine      Objective     Scheduled Meds:allopurinoL (ZYLOPRIM) tablet 100 mg, 100 mg, Oral, BID  [START ON 03/19/2023] aspirin chewable tablet 81 mg, 81 mg, Oral, QDAY  atorvastatin (LIPITOR) tablet 80 mg, 80 mg, Oral, ONCE  cefepime (MAXIPIME) IVP 2 g, 2 g, Intravenous, Q24H*  gabapentin (NEURONTIN) capsule 100 mg, 100 mg, Oral, QHS  insulin aspart (U-100) (NOVOLOG FLEXPEN U-100 INSULIN) injection PEN 0-6 Units, 0-6 Units, Subcutaneous, ACHS (22)  [START ON 03/19/2023] levothyroxine (SYNTHROID) tablet 75 mcg, 75 mcg, Oral, QDAY  phentolamine (REGITINE) injection 10 mg, 10 mg, Subcutaneous, ONCE  pramipexole (MIRAPEX) tablet 1.5 mg, 1.5 mg, Oral, QHS  [START ON 03/19/2023] ticagrelor (BRILINTA) tablet 90 mg, 90 mg, Oral, BID  WATER FOR INJECTION, STERILE IJ SOLN (Cabinet Override), , , NOW    Continuous Infusions:   heparin (porcine) 20,000 units/D5W 500 mL infusion (std conc)(premade) 1,000 Units/hr (03/18/23 1716)    norepinephrine (LEVOPHED) 4 mg in dextrose 5% (D5W) 250 mL IV drip (std conc) 0.03 mcg/kg/min (03/18/23 1429)    sodium chloride 0.9 %   infusion 100 mL/hr at 03/18/23 2030     PRN and Respiratory Meds:dextrose 50% (D50) IV PRN, diphenhydrAMINE HCL Q4H PRN **OR** diphenhydrAMINE HCL Q4H PRN, heparin (porcine) TITRATE **AND** heparin (porcine) Q6H  PRN, ondansetron (ZOFRAN) IV Q6H PRN, polyethylene glycol 3350 QDAY PRN, sennosides-docusate sodium BID PRN                           Vital Signs: Last Filed                 Vital Signs: 24 Hour Range   BP: 111/81 (12/23 2030)  Temp: 35.7 ?C (96.2 ?F) (12/23 2030)  Pulse: 75 (12/23 2030)  Respirations: 15 PER MINUTE (12/23 2030)  SpO2: 100 % (12/23 2030)  O2 Device: Nasal cannula (12/23 2030)  O2 Liter Flow: 3 Lpm (12/23 2030)  Height: 177.8 cm (5' 10) (12/23 1600) BP: (97-111)/(68-81)   Temp:  [35.7 ?C (96.2 ?F)-36.4 ?C (97.5 ?F)]   Pulse:  [70-76]   Respirations:  [13 PER MINUTE-20 PER MINUTE]   SpO2:  [99 %-100 %]   O2 Device: Nasal cannula  O2 Liter Flow: 3 Lpm   Intensity Pain Scale (Self Report): 3 (03/18/23 1600) Vitals:    03/18/23 1400 03/18/23 1600   Weight: 95.3 kg (210 lb) 95.3 kg (210 lb)         Physical Exam:  Constitutional: 77 y.o. male AAOx3, resting in bed in no apparent distress  Head: Normocephalic, atraumatic, poor dentition  Eyes: Extra-occular muscles intact, PERRL, clear sclera  ENT: Nose midline without drainage, neck supple without tracheal deviation, oral mucous membrane moist  Cardiovascular: Regular rhythm, normal rate, normal S1 and S2, no murmur /rub/gallop  Pulmonary: Clear to auscultation bilaterally, no wheezes or rale  GI/GU: Abdomen soft, non-tender, non-distended (baseline habitus), bowel sounds throughout; indwelling foley catheter in place  Skin: No rashes or bruises, good turgor, cap refill <2s, bilateral venous stasis changes; right lower extremity with area of increased erythema and warmth without tenderness to palpation; bilateral groin and panus area with increased moisture, erythema, and warmth  Neuro: Cranial nerves II-XII intact, no gross focal neurological deficits  Musculoskeletal: Moves all extremities well  Lymphatic/extremities: No pitting edema, pulses present b/l    Artificial Airway   None       Ventilator/Respiratory Therapy  No     Vent Weaning   Not applicable    Laboratory:  Recent Labs     03/18/23  1415   NA 145   K 3.9   CL 115*   CO2 20*   GAP 10   BUN 62*   CR 2.71*   GLU 119*   CA 8.8   ALBUMIN 3.2*   MG 2.3   PO4 3.5       Recent Labs     03/18/23  1415 03/18/23  1416 03/18/23  1605   WBC  --  5.60  --    HGB  --  9.8*  --    HCT  --  30.2*  --    PLTCT  --  181  --    PT  --   --  14.1*   INR  --   --  1.3*   PTT  --   --  54.6*   AST 75*  --   --    ALT 31  --   --    ALKPHOS 87  --   --       Estimated Creatinine Clearance: 25.6 mL/min (A) (based on SCr of 2.71 mg/dL (H)).  Vitals:    03/18/23 1400 03/18/23 1600   Weight: 95.3 kg (210 lb) 95.3 kg (210  lb)      Recent Labs     03/18/23  1423   PHART 7.38   PO2ART 189*         Pertinent radiology reviewed.

## 2023-03-18 NOTE — Progress Notes
 RT Adult Assessment Note    NAME:Paul Benjamin             MRN: [id number]             DOB:[birth year]          AGE: 77 y.o.  ADMISSION DATE: 03/18/2023             DAYS ADMITTED: LOS: 0 days    Additional Comments:  Impressions of the patient: Pt resting in bed eating on RA, NAD. Family at bedside. Pt brought pt owned CPAP machine from home.   Intervention(s)/outcome(s): RT eval  Patient education that was completed: NA  Recommendations to the care team: NA    Vital Signs:  Pulse: 81  RR: 12 PER MINUTE  SpO2: 96 %  O2 Device: None (Room air)  Liter Flow:    O2%:      Breath Sounds:   Right Apex Breath Sounds: Clear (Implies normal)  Right Base Breath Sounds: Decreased  Left Apex Breath Sounds: Clear (Implies normal)  Left Base Breath Sounds: Decreased  Respiratory Effort:   Respiratory Effort/Pattern: Unlabored  Comments:

## 2023-03-19 ENCOUNTER — Encounter: Admit: 2023-03-19 | Discharge: 2023-03-19 | Payer: MEDICARE

## 2023-03-19 ENCOUNTER — Inpatient Hospital Stay: Admit: 2023-03-19 | Discharge: 2023-03-19 | Payer: MEDICARE

## 2023-03-19 VITALS — BP 108/64 | HR 76

## 2023-03-19 VITALS — BP 108/68 | HR 73 | Temp 97.30000°F

## 2023-03-19 VITALS — BP 102/60 | HR 72

## 2023-03-19 VITALS — BP 105/61 | HR 71

## 2023-03-19 VITALS — BP 106/62 | HR 78

## 2023-03-19 VITALS — BP 121/54 | HR 73

## 2023-03-19 VITALS — BP 92/61 | HR 70

## 2023-03-19 VITALS — BP 110/68 | HR 79

## 2023-03-19 VITALS — BP 117/72 | HR 79

## 2023-03-19 VITALS — BP 121/54 | HR 74

## 2023-03-19 VITALS — HR 77

## 2023-03-19 VITALS — BP 91/57 | HR 70

## 2023-03-19 VITALS — BP 97/60 | HR 72

## 2023-03-19 VITALS — BP 98/62 | HR 74

## 2023-03-19 VITALS — BP 111/66 | HR 75

## 2023-03-19 VITALS — BP 97/64 | HR 77

## 2023-03-19 VITALS — BP 90/55 | HR 70

## 2023-03-19 VITALS — BP 125/73 | HR 75 | Temp 97.00000°F | Wt 212.5 lb

## 2023-03-19 VITALS — BP 119/79 | HR 73 | Temp 97.40000°F

## 2023-03-19 VITALS — BP 108/65 | HR 77

## 2023-03-19 VITALS — BP 108/62 | HR 75

## 2023-03-19 VITALS — BP 107/61 | HR 73

## 2023-03-19 VITALS — BP 90/58 | HR 72

## 2023-03-19 VITALS — BP 117/77 | HR 79 | Temp 97.60000°F

## 2023-03-19 VITALS — HR 74

## 2023-03-19 VITALS — BP 87/55 | HR 75

## 2023-03-19 VITALS — BP 106/58 | HR 73

## 2023-03-19 VITALS — BP 118/78 | HR 75 | Temp 97.90000°F

## 2023-03-19 VITALS — BP 87/62 | HR 70

## 2023-03-19 VITALS — BP 94/57 | HR 70

## 2023-03-19 LAB — POC GLUCOSE
~~LOC~~ BKR POC GLUCOSE: 114 mg/dL — ABNORMAL HIGH (ref 70–100)
~~LOC~~ BKR POC GLUCOSE: 144 mg/dL — ABNORMAL HIGH (ref 70–100)
~~LOC~~ BKR POC GLUCOSE: 173 mg/dL — ABNORMAL HIGH (ref 70–100)
~~LOC~~ BKR POC GLUCOSE: 314 mg/dL — ABNORMAL HIGH (ref 70–100)

## 2023-03-19 LAB — LIPID PROFILE
~~LOC~~ BKR CHOLESTEROL: 76 mg/dL — ABNORMAL LOW (ref 28–<200)
~~LOC~~ BKR HDL: 26 mg/dL — ABNORMAL LOW (ref >40–300)
~~LOC~~ BKR LDL: 38 mg/dL (ref <100)
~~LOC~~ BKR NON HDL CHOLESTEROL: 50 mg/dL
~~LOC~~ BKR TRIGLYCERIDES: 123 mg/dL — ABNORMAL LOW (ref 185–<150)
~~LOC~~ BKR VLDL: 24 mg/dL

## 2023-03-19 LAB — 2D + DOPPLER ECHO
AORTIC VALVE STROKE VOLUME INDEX: 25
AV INDEX (NATIVE): 0.4
AV MEAN GRADIENT: 4.2 mmHg
AV PEAK VELOCITY: 1.4 m/s
AV VALVE AREA: 1.7 cm2
BSA: 2.1 m2
DOP CALC AO VTI: 31 cm
DOP CALC LVOT AREA: 3.8 cm2
DOP CALC LVOT DIAMETER: 2.2 cm
DOP CALC LVOT PEAK VEL VTI: 14 cm
DOP CALC LVOT PEAK VEL: 0.6 m/s
DOP CALC LVOT STROKE VOLUME: 53 cm3
EJECTION FRACTION: 55 %
FRACTIONAL SHORTENING: 32 % (ref 28–44)
INTERVENTRICULAR SEPTUM: 1.2 cm (ref 0.6–1)
LEFT ATRIUM INDEX: 20 mL/m2 (ref 16–34)
LEFT ATRIUM SIZE: 4.8 cm (ref 3–4)
LEFT ATRIUM VOLUME: 43 mL (ref 18–58)
LEFT INTERNAL DIMENSION IN SYSTOLE: 2.9 cm (ref 2.5–4)
LEFT VENTRICLE DIASTOLIC VOLUME INDEX: 88 mL/m2 (ref 34–74)
LEFT VENTRICLE DIASTOLIC VOLUME: 190 mL (ref 62–150)
LEFT VENTRICLE MASS INDEX: 85 g/m2 (ref 49–115)
LEFT VENTRICLE SYSTOLIC VOLUME INDEX: 57 mL/m2 (ref 11–31)
LEFT VENTRICLE SYSTOLIC VOLUME: 124 mL (ref 21–61)
LEFT VENTRICULAR INTERNAL DIMENSION IN DIASTOLE: 4.3 cm (ref 4.2–5.8)
LEFT VENTRICULAR MASS: 185 g (ref 88–224)
POSTERIOR WALL: 1.2 cm (ref 0.6–1)
PROX AORTA: 4.2 cm (ref 2.6–3.8)
RA PRESSURE: 15
RELATIVE WALL THICKNESS: 0.5 (ref <=0.42)
RIGHT ATRIAL AREA: 24 cm2 (ref <18)
RIGHT HEART SYSTOLIC MMODE TAPSE: 1.2 cm (ref >1.7)
RIGHT HEART SYSTOLIC TDI S': 0.5 m/s
RIGHT VENTRICULAR BASAL DIAMETER: 5.5 cm (ref 2.5–4.1)
RIGHT VENTRICULAR MID DIAMETER: 5 cm (ref 1.9–3.5)
RV SYSTOLIC PRESSURE: 16
SIMPSONS BIPLANE EF: 35 %
SINUS: 3.7 cm (ref 2.8–4)
STJ: 3.5 cm (ref 2.3–3.5)
TDI LATERAL E': 0.1 m/s
TDI MEDIAL E': 0.1 m/s
TR PEAK VELOCITY: 2 m/s
TV REST PULMONARY ARTERY PRESSURE: 31 mmHg

## 2023-03-19 LAB — ECG 12-LEAD
P AXIS: 85 degrees
P-R INTERVAL: 322 ms
Q-T INTERVAL: 468 ms
QRS DURATION: 98 ms
QTC CALCULATION (BAZETT): 519 ms
R AXIS: -40 degrees
T AXIS: -39 degrees
VENTRICULAR RATE: 74 {beats}/min

## 2023-03-19 LAB — POC ACTIVATED CLOTTING TIME (ISTAT)
~~LOC~~ BKR POCT ACT ISTAT: 204 s
~~LOC~~ BKR POCT ACT ISTAT: 227 s
~~LOC~~ BKR POCT ACT ISTAT: 302 s

## 2023-03-19 LAB — COMPREHENSIVE METABOLIC PANEL
~~LOC~~ BKR ALBUMIN: 3 g/dL — ABNORMAL LOW (ref 3.5–5.0)
~~LOC~~ BKR CALCIUM: 8.5 mg/dL — ABNORMAL HIGH (ref 8.5–10.6)
~~LOC~~ BKR TOTAL BILIRUBIN: 0.5 mg/dL — ABNORMAL LOW (ref 0.2–1.3)
~~LOC~~ BKR TOTAL PROTEIN: 5.5 g/dL — ABNORMAL LOW (ref 6.0–8.0)

## 2023-03-19 LAB — CBC AND DIFF
~~LOC~~ BKR HEMATOCRIT: 25 % — ABNORMAL LOW (ref 40.0–50.0)
~~LOC~~ BKR HEMOGLOBIN: 8.5 g/dL — ABNORMAL LOW (ref 13.5–16.5)
~~LOC~~ BKR MCH: 28 pg — ABNORMAL HIGH (ref 26.0–34.0)
~~LOC~~ BKR MCHC: 33 g/dL — ABNORMAL HIGH (ref 32.0–36.0)
~~LOC~~ BKR MCV: 85 fL — ABNORMAL HIGH (ref 80.0–100.0)
~~LOC~~ BKR MPV: 6.9 fL — ABNORMAL LOW (ref 7.0–11.0)
~~LOC~~ BKR PLATELET COUNT: 168 10*3/uL — ABNORMAL HIGH (ref 150–400)
~~LOC~~ BKR RBC COUNT: 3 10*6/uL — ABNORMAL LOW (ref 4.40–5.50)
~~LOC~~ BKR RDW: 18 % — ABNORMAL HIGH (ref 11.0–15.0)
~~LOC~~ BKR WBC COUNT: 5.2 10*3/uL — ABNORMAL HIGH (ref 4.50–11.00)

## 2023-03-19 LAB — HIGH SENSITIVITY TROPONIN I, RANDOM
~~LOC~~ BKR HIGH SENSITIVITY TROPONIN I: >22 ng/L — ABNORMAL HIGH (ref <20)
~~LOC~~ BKR HIGH SENSITIVITY TROPONIN I: >22 ng/L — ABNORMAL HIGH (ref 3–<20)

## 2023-03-19 LAB — PTT (APTT): ~~LOC~~ BKR PTT: >20 s — ABNORMAL HIGH (ref 24.0–36.5)

## 2023-03-19 LAB — TSH WITH FREE T4 REFLEX: ~~LOC~~ BKR TSH: 1.9 [IU]/mL — ABNORMAL LOW (ref 0.35–5.00)

## 2023-03-19 LAB — IRON + BINDING CAPACITY + %SAT+ FERRITIN: ~~LOC~~ BKR IRON: 27 g/dL — ABNORMAL LOW (ref 50–185)

## 2023-03-19 MED ORDER — ATORVASTATIN 40 MG PO TAB
80 mg | Freq: Every evening | ORAL | 0 refills | Status: DC
Start: 2023-03-19 — End: 2023-03-26
  Administered 2023-03-20 – 2023-03-26 (×7): 80 mg via ORAL

## 2023-03-19 MED ORDER — KETOCONAZOLE 2 % TP CREA
Freq: Two times a day (BID) | TOPICAL | 0 refills | Status: DC
Start: 2023-03-19 — End: 2023-03-26
  Administered 2023-03-20 – 2023-03-24 (×2): via TOPICAL

## 2023-03-19 MED ORDER — TICAGRELOR 90 MG PO TAB
90 mg | ORAL_TABLET | Freq: Two times a day (BID) | ORAL | 0 refills | Status: DC
Start: 2023-03-19 — End: 2023-03-26

## 2023-03-19 NOTE — Progress Notes
 Cardiovascular Medicine and Interventional Cardiology   Progress Note    Hospital Day: 1    Chief complaint: chest pain    Clinical summary:   [name] is a 77 year old male with HTN, hypertriglyceridemia, hyperlipidemia, suspected septic shock, and CAD-s/p PCI of the LAD and D1 who presented as a transfer from [facility] with a NSTEMI and cardiogenic shock and urosepsis. Initially requiring pressor    Key events during this stay:   (12/23) Admitted with CP        ASSESSMENT & PLAN:      CAD/ Type I NSTEMI  -- S/p PCI with DES X 2 to RCA  -- DAPT (ASA + Brilinta) X 12 months.  -- Echo with EF 30-35% (from 56% in 01/2023)  -- Optimize GDMT including atorvastatin  -- Cardiac rehab to be arranged if he does not require rehab at discharge.  Follow up with IC APP in 1 weeks. Scheduled on 1/9    2. Hypertension now with cardiogenic vs septic shock (Indwelling foley)  -- On Norepi with MAP 75 mmhg  -- Holding ACEi/ARB and spironolactone due to soft pressures.   -- Continue to monitor and consider ID consult if unable to wean.     3. Hyperlipidemia   -- Increase atorvastatin from 40 mg-> 80 mg HS.    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 DMT2   -- A1c 5.9  -- Remains on long-term insulin use     6. Afib s/p ablation 2009   7. SSS s/p dual chamber PPM 2008   -- Would stop ASA if OAC is initiated         Total time today was 30 minutes spent doing an independent review on the following activities: Counseling and educating the patient/family/caregiver, Ordering medications, tests, or procedures, and Referring and communication with other health care professionals (when not separately reported) .   I have discussed the case with the following:         CHANGES IN THE PAST 24 HOURS:    Clinical: No reports of chest pain, fevers or chills.     Tests/Procedures/Labs:   Coronary angiogram    FINAL IMPRESSION:    Normal left and right-sided pressures.  Normal pulmonary artery pressures.  Low cardiac output and low cardiac index by thermodilution.  Normal cardiac index by Fick.  Low PAPi.  Successful IVUS-guided percutaneous coronary intervention of the right coronary artery with 4.5 mm x 30 mm Onyx Frontier drug-eluting stent, which was postdilated with 4.5 mm x 20 mm noncompliant balloon at 18 atmospheres distally and 5 mm x 20 mm noncompliant balloon at 18 atmospheres proximally.  This was followed by a 4.5 mm x 15 mm Onyx Frontier drug-eluting stent in the proximal portion in overlapping fashion of the previously placed stent for a maximum duration of 31 seconds at 18 atmospheres.     RECOMMENDATION:    Aggressive risk factor and lifestyle modification.  Recommend optimizing medical therapy for coronary  artery disease.  Recommend dual antiplatelet therapy of aspirin 81 mg p.o. daily and Brilinta 90 mg p.o. b.i.d. for a total duration of 12 months as long as there are no contraindications for underlying ACS presentation.  Routine cardiac rehab post PCI.    Echo   Moderate reduced LV systolic function EF estimated 30-35%.  Wall motion abnormality described below.  Wall motion is suggestive of possible LAD territory injury/ischemia +/- multivessel disease.  No LV thrombus identified on the current study.  The RV does appear to be moderately to severely dilated.  Mild RV systolic dysfunction.    More focal distal free wall hypokinesis appreciated.    RA/RV pacer wire identified.  Mild tricuspid valve regurgitation.  Known bicuspid aortic valve.  Fusion of the left and right coronary cusp.  Visually leaflet excursion is limited.  No evidence of significant aortic valve stenosis.  Peak velocity 1.4 m/s, mean gradient 4 mmHg.  DVI 0.43.  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  The aortic root measures 3.7 cm.  No pericardial effusion.  CVP >15 mmHg.  PA systolic pressure estimated at 31 mmHg.  However PA systolic pressure could be underestimated given RV dysfunction.     Side-by-side comparison with the prior November 2024 study.  There has been interval decrease in LV systolic function previously estimate is preserved at 56% per biplane without wall motion abnormality.     Previously the right ventricle was described as being normal in size with preserved function.  Currently the RV appears to be severely dilated with mild RV systolic dysfunction.      Lab Results   Component Value Date/Time    K 4.0 03/19/2023 04:02 AM    CR 2.46 (H) 03/19/2023 04:02 AM    AST 235 (H) 03/19/2023 04:02 AM    ALT 51 03/19/2023 04:02 AM     Lab Results   Component Value Date/Time    WBC 5.20 03/19/2023 04:02 AM    RBC 3.01 (L) 03/19/2023 04:02 AM    HGB 8.5 (L) 03/19/2023 04:02 AM    HCT 25.7 (L) 03/19/2023 04:02 AM    PLTCT 168 03/19/2023 04:02 AM    INR 1.3 (H) 03/18/2023 04:05 PM     Lab Results   Component Value Date/Time    CHOL 76 03/19/2023 04:02 AM    TRIG 123 03/19/2023 04:02 AM    HDL 26 (L) 03/19/2023 04:02 AM    LDL 38 03/19/2023 04:02 AM     No components found for: TROPONIN  Lab Results   Component Value Date/Time    BNP 61 08/11/2020 12:00 AM        OBJECTIVE:    Vitals:    Vitals:    03/19/23 0400 03/19/23 0500 03/19/23 0600 03/19/23 0700   BP: 125/73 91/57 102/60 107/61   BP Source: Arm, Left Upper Arm, Left Upper Arm, Left Upper Arm, Left Upper   Pulse: 75 70 72 73   Temp: 36.1 ?C (97 ?F)      SpO2: 97% 97% 96% 96%   O2 Device: CPAP/BiPAP CPAP/BiPAP None (Room air)    O2 Liter Flow:       Weight: 96.4 kg (212 lb 8.4 oz)      Height:           Weight change:   Intake/Output Summary (Last 24 hours) at 03/19/2023 1002  Last data filed at 03/19/2023 0700  Gross per 24 hour   Intake 1300.47  ml   Output 1235 ml   Net 65.47 ml     Body mass index is 30.49 kg/m?Marland Kitchen    Physical Exam  Vitals and nursing note reviewed.   Constitutional:       Appearance: Normal appearance. He is normal weight. He is ill-appearing.   HENT:      Head: Normocephalic and atraumatic.   Cardiovascular:      Rate and Rhythm: Normal rate and regular rhythm.      Pulses: Normal pulses.           Carotid pulses are 2+ on the right side and 2+ on the left side.     Heart sounds: Normal heart sounds, S1 normal and S2 normal.      Comments: Right radial soft. No hematoma.   Pulmonary:      Effort: Pulmonary effort is normal.      Breath sounds: Normal breath sounds.   Abdominal:      Palpations: Abdomen is soft.   Musculoskeletal:         General: Normal range of motion.      Cervical back: Neck supple.   Skin:     General: Skin is warm and dry.      Capillary Refill: Capillary refill takes less than 2 seconds.      Comments: Large amounts of ecchymosis along the upper forearms.    Neurological:      General: No focal deficit present.      Mental Status: He is alert and oriented to person, place, and time. Mental status is at baseline.   Psychiatric:         Mood and Affect: Mood normal.         Thought Content: Thought content normal.         Judgment: Judgment normal.         Scheduled medications:     ticagrelor (BRILINTA) 90 mg tablet Take one tablet by mouth twice daily. Indications: blood clot prevention following percutaneous coronary intervention       Infusion medications:  Current Facility-Administered Medications   Medication    allopurinoL (ZYLOPRIM) tablet 100 mg    aspirin chewable tablet 81 mg    cefepime (MAXIPIME) IVP 2 g    clotrimazole (LOTRIMIN) 1 % topical cream    dextrose 50% (D50) syringe 25-50 mL    diphenhydrAMINE HCL (BENADRYL) capsule 25 mg    Or    diphenhydrAMINE HCL (BENADRYL) injection 25 mg    gabapentin (NEURONTIN) capsule 100 mg    heparin (porcine) PF syringe 5,000 Units    insulin aspart (U-100) (NOVOLOG FLEXPEN U-100 INSULIN) injection PEN 0-6 Units    levothyroxine (SYNTHROID) tablet 75 mcg    norepinephrine (LEVOPHED) 4 mg in dextrose 5% (D5W) 250 mL IV drip (std conc)    ondansetron HCL (PF) (ZOFRAN (PF)) injection 4 mg    polyethylene glycol 3350 (MIRALAX) packet 17 g    pramipexole (MIRAPEX) tablet 1.5 mg    sennosides-docusate sodium (SENOKOT-S) tablet 1 tablet    ticagrelor (BRILINTA) tablet 90 mg      norepinephrine (LEVOPHED) 4 mg in dextrose 5% (D5W) 250 mL IV drip (std conc) 0.03 mcg/kg/min (03/19/23 4742)

## 2023-03-19 NOTE — Consults
 Wound Ostomy Note    NAME:Paul Benjamin                                                                   MRN: [id number]                 DOB:1945/08/01          AGE: 77 y.o.  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  Spoke with pt's RN about the BLE because no wounds are being charted.  RN reports scabs, edema and erythema - no weeping wounds.  Will defer to primary team to decide is compression can be placed.    Recommendations: --- Primary Team is responsible for approving and placing wound care orders.  ---     Moisture Associated Skin Damage:   - Wash affected areas with soap and water daily. Dry well.  - If located in gluteal cleft/perineum/groin apply Antifungal Barrier cream TID and PRN.  - If located in skin folds on torso or lower extremities, lay single layer of DriGo-HP Antibacterial Sheet fabric flat in fold with at least 2 inches of fabric exposed outside the skin fold. May re-use up to 5 days, change if soiled.   Additional Recommendations:   - Implement components of HAPI bundle as appropriate for patient condition as MASD increases patient's risk for pressure injury development.  - Avoid briefs if possible and use only one chux pad at at time underneath pt to prevent moisture and heat trapping against skin.  - If stool is loose and incontinence is frequent, consider placement of flexiseal if not contraindicated.     Wound/Ostomy Team did not see pt, will sign-off.    Lora Havens, RN, BSN, CWON  Wound Ostomy Nursing Consult Service  Available via [employer] Text M-F [phone]  Office number [phone]  For questions after 4:00pm M-F/weekends/holidays, voalte ?First Contact?Marland Kitchen

## 2023-03-19 NOTE — Consults
 Infectious Diseases Consult    Today's Date:  03/19/2023  Admission Date: 03/18/2023    Reason for this consultation: Septic shock, likely urinary source, with history of complicated UTI     Assessment:     Shock, septic +/-cardiogenic  Complicated UTI  12/5 Serratia marcescens bacteriuria, treated with augmentin->cephalexin  12/22 UC at [facility] is still pending per verbal from lab  12/22 NGTD BCs  12/23 pyuria at [facility], Serratia marcescens    NSTEMI  HFrEF  H/o CAD s/p PCI x 2  - low BP reading of 70s/50s at home, chest tightness  - Presented to outside ER morning of 12/23, found to have elevated troponin of 3.933 (0 - .033), EKG with AV block, prolonged QTC  - Trop:  12,690 --> 14,792 post transfer  - POCUS notable for RV, IVC dilation; 12/23 ECHO with EF now 30-35%  - Given clinical presentation, ongoing chest pain, hypotension requiring pressors and evidence of regional myocardial injury on echocardiogram, he was emergently taken to the Cath Lab. Coronary angiography revealed multivessel disease with culprit lesion thought to be a thrombotic occlusion of a large, dominant RCA. 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 A Urethral catheter placed    A fib s/p ablation - not on AC  SSS s/p PPM (09/2006)    H/o COVID-19 pneumonia with acute hypoxic respiratory failure 2020     DM type 2  CKD  HTN  HLD  Obesity      Chronic LE edema w/ venous stasis with prior cellulitis    Abx allergies/intolerance  -TMP/SMX: break out in hives per patient  -Cefuorixime - Reported hives  -Clindamycin - Reported rash    Recommendations:     Agree with cefepime (renal dosed) for now  Follow-up UC and BC  Will try to contact lab at [facility] to see if pts cultures are resulted  Monitor for now daily cbc/diff, CMP given antimicrobial drug therapy which requires intensive monitoring for toxicity  Keep RLE above level of the heart while at rest  Ketoconazole cream for toes bid x 2 wks  A&D ointment for leg abrasions bid      Thank you for the consult; will follow     History of Present Illness    Paul Benjamin is a 77 y.o. patient who I am seeing today for septic shock with potential urinary source, an acute infection that may pose a threat to life or bodily function and necessitates antimicrobial drug therapy that requires intensive monitoring for toxicity.     Paul Benjamin has a PMHx as listed below. Patient's history or present illness was obtained via review and summarization of old medical records and via discussion with patient and family at bedside.     Paul Benjamin has h/o a few more remote UTIs, none last year or 2 per daughter. He was recent hospitalized at 04/02/2022 - 02/07/2023,with acute on chronic renal insufficiency.  Renal US showed (B) mild hydroureteronephrosis, presumably d/t urinary retention & chronic bladder outlet obstruction (BOO). A Urethral catheter placed (coude cath, per daughter's report) and rec he follow-up with urology for prostate.  11/8 UC < 100k urogenital flora.  He was dc to rehab and then rehab on 02-17-2023. He noted at follow-up urology appt 12/4 chronic irritative lower urinary symptoms including frequency, urgency, nocturia every hour,  and urge urinary incontinence but felt he was emptying bladder. He had urinary incontinence and this worsened w diuretics.  He doesn't recall now having new UTI sx at this appt. He left with urinary catheter w plan to follow-up and discuss SPT. UA/UC done. He also follow-up with nephology same date - BP was low - apparently he was told to restart carvedilol at rehab though it had been switched to metoprolol at hospital dc. He was transferred to ED. His bp improved w saline. Next day PCP follow-up went back to metoprolol and no other new med changes. His UA that day and UC was + for Serratia marcescens. Patient was started on Augmentin empirically. Based on susceptibility, he was changed to Keflex for treatment 12/7. HE finished this around the 15th. On 12/21 pm he had chest pressure and heartburn and asked for water. He rested Paul Link but still had pain. No f/c/s/+rhinorrhea/no cough/sob /diarrhea/constipation/GU sx that were new. His daughter notes leg on R a little more red. His HH RN took bp and it was 34 Mon am systolic -given his cp/fatigue/bp he was sent to ED.  He presented to [facility] and was found to be hypotensive. He had an elevated troponin of 3, BNP 467, lactate 2.8, and shock with BP 72/41 He was subsequently transferred to the [facility] MICU for escalation of care. BC there (verbal) NTD and UC is pending. Today (now post cath and PCI) his CP is resolved, No HA/dizziness and was able to walk w walker. No cough/sob + rhinorrhea, no abd pain/nv/d/GU sx. His catheter was changed. Has few abrasions on legs but no other new concerns.    Antimicrobial Start date End date   Cefepime  12/24                                  Estimated Creatinine Clearance: 28.4 mL/min (A) (based on SCr of 2.46 mg/dL (H)).    Past Medical History     Past Medical History:    Atrial fibrillation (HCC)    CAD (coronary artery disease)    CHF (congestive heart failure) (HCC)    Coronary artery disease (CAD)    HX: anticoagulation    Hypercholesterolemia    Hypertension    Hypertriglyceridemia    Kidney disease    Obesity    Sleep apnea    SSS (sick sinus syndrome) (HCC)    Type II diabetes mellitus (HCC)       Past Surgical History     Surgical History:   Procedure Laterality Date    PACEMAKER PLACEMENT  09/30/2006    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    Performed by Kathreen Cornfield, MD at [facility] EP LAB    Removal Permanent Pacemaker N/A 01/17/2016    Performed by Kathreen Cornfield, MD at [facility] EP LAB    [facility]      CARDIAC CATHERIZATION      CORONARY STENT PLACEMENT      HX APPENDECTOMY         Social History   Marital status/area of residence: Married, lives in [place], [place]  Job/occupation: Retired, formerly worked at a [employer], 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  No poultry or livestock exposure.  Drugs of abuse: no  Social History     Tobacco Use    Smoking status: Former     Current packs/day: 0.00  Average packs/day: 1 pack/day for 4.0 years (4.0 ttl pk-yrs)     Types: Cigarettes     Start date: 03/26/1962     Quit date: 03/26/1966     Years since quitting: 57.0    Smokeless tobacco: Never   Substance Use Topics    Alcohol use: No     Comment: rarely     Family History     Family History   Problem Relation Name Age of Onset    Asthma Father Jomarie Longs     Cardiovascular Father Jomarie Longs     Hypertension Mother Malena Catholic     High Cholesterol Mother Lucille        Allergies     Allergies   Allergen Reactions    Ciprofloxacin RASH    Lisinopril SEE COMMENTS     hyperkalemia    Cefuroxime HIVES     Tolerated augmentin Aug 2024 and zosyn in Nov 2024    Clindamycin RASH    Plavix [Clopidogrel] RASH    Sulfa (Sulfonamide Antibiotics) HIVES    Quinine SEE COMMENTS     Allergy recorded in SMS: QUININE       Review of Systems   A comprehensive 14-point review of systems was negative with exception of the review of systems as noted in the history of present illness.    Medications   Scheduled Meds:allopurinoL (ZYLOPRIM) tablet 100 mg, 100 mg, Oral, BID  aspirin chewable tablet 81 mg, 81 mg, Oral, QDAY  atorvastatin (LIPITOR) tablet 80 mg, 80 mg, Oral, QHS  cefepime (MAXIPIME) IVP 2 g, 2 g, Intravenous, Q24H*  clotrimazole (LOTRIMIN) 1 % topical cream, , Topical, BID  gabapentin (NEURONTIN) capsule 100 mg, 100 mg, Oral, QHS  heparin (porcine) PF syringe 5,000 Units, 5,000 Units, Subcutaneous, Q8H  insulin aspart (U-100) (NOVOLOG FLEXPEN U-100 INSULIN) injection PEN 0-6 Units, 0-6 Units, Subcutaneous, ACHS (22)  levothyroxine (SYNTHROID) tablet 75 mcg, 75 mcg, Oral, QDAY  pramipexole (MIRAPEX) tablet 1.5 mg, 1.5 mg, Oral, QHS  ticagrelor (BRILINTA) tablet 90 mg, 90 mg, Oral, BID    Continuous Infusions:   norepinephrine (LEVOPHED) 4 mg in dextrose 5% (D5W) 250 mL IV drip (std conc) 0.03 mcg/kg/min (03/19/23 0643)     PRN and Respiratory Meds:dextrose 50% (D50) IV PRN, diphenhydrAMINE HCL Q4H PRN **OR** diphenhydrAMINE HCL Q4H PRN, ondansetron (ZOFRAN) IV Q6H PRN, polyethylene glycol 3350 QDAY PRN, sennosides-docusate sodium BID PRN      Physical Examination                          Vital Signs: Most Recent                  Vital Signs: 24 Hour Range   BP: 107/61 (12/24 0700)  Temp: 36.1 ?C (97 ?F) (12/24 0400)  Pulse: 73 (12/24 0700)  Respirations: 14 PER MINUTE (12/24 0700)  SpO2: 96 % (12/24 0700)  O2%: 21 % (12/24 0106)  O2 Device: None (Room air) (12/24 0600)  O2 Liter Flow: 3 Lpm (12/23 2200)  Height: 177.8 cm (5' 10) (12/23 1600) BP: (87-125)/(55-94)   Temp:  [35.7 ?C (96.2 ?F)-36.5 ?C (97.7 ?F)]   Pulse:  [70-82]   Respirations:  [11 PER MINUTE-23 PER MINUTE]   SpO2:  [96 %-100 %]   O2%:  [21 %]   O2 Device: None (Room air)  O2 Liter Flow: 3 Lpm     General appearance: awake, alert, nad  HENT: mucus membranes moist, no oral  lesions/thrush  Eyes: PERRL, EOM grossly intact, Conj nl  Neck: supple, no lymphadenopathy  Lungs: no wheezing, rhonchi, rales appreciated  Heart: Regular rhythm, normal rate, with no murmur, rub, gallop  Abdomen: soft, non-tender, non-distended, normoactive bowel sounds, no hepatosplenomegaly, no masses, foley clear yellow urine  Ext:  No clubbing, cyanosis,2+ LE edema; BL venous stasis changes but R medial calf warm/red and few abrasions both shins  Musculo: no joint swelling or effusions  Skin: no other rash  Vascular: normal radial/dorsalis pedis pulses  Lymph: no cervical adenopathy  Neuro: oriented, interactive, moves all extremities  Psych: normal mood and affect    Lines: no erythema    Laboratory (reviewed and or/ ordered by me)    Hematology  Recent Labs     03/18/23  1416 03/18/23  1605 03/18/23  2205 03/19/23  0402   WBC 5.60  --   --  5.20   HGB 9.8*  --   --  8.5*   HCT 30.2*  --   --  25.7*   PLTCT 181  --   --  168   PTT  --  54.6* >200.0*  --    INR  --  1.3*  --   --      Chemistry  Recent Labs     03/18/23  1415 03/19/23  0402   NA 145 145   K 3.9 4.0   CL 115* 116*   CO2 20* 21   BUN 62* 58*   CR 2.71* 2.46*   GFR 23* 26*   GLU 119* 137*   CA 8.8 8.5   PO4 3.5 3.9   ALBUMIN 3.2* 3.0*   ALKPHOS 87 75   AST 75* 235*   ALT 31 51   TOTBILI 0.4 0.5     Microbiology, Radiology and other Diagnostics Review   Microbiology data reviewed and/or ordered by me.    Pertinent radiology images were visualized and reviewed and/or ordered by me, and the dates and associated independent interpretation is incorporated into the above HPI/assessment.       12/23 ECHO  Interpretation Summary  Show Result ComparisonModerate reduced LV systolic function EF estimated 30-35%.  Wall motion abnormality described below.  Wall motion is suggestive of possible LAD territory injury/ischemia +/- multivessel disease.  No LV thrombus identified on the current study.  The RV does appear to be moderately to severely dilated.  Mild RV systolic dysfunction.    More focal distal free wall hypokinesis appreciated.    RA/RV pacer wire identified.  Mild tricuspid valve regurgitation.  Known bicuspid aortic valve.  Fusion of the left and right coronary cusp.  Visually leaflet excursion is limited.  No evidence of significant aortic valve stenosis.  Peak velocity 1.4 m/s, mean gradient 4 mmHg.  DVI 0.43.  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  The aortic root measures 3.7 cm.  No pericardial effusion.  CVP >15 mmHg.  PA systolic pressure estimated at 31 mmHg.  However PA systolic pressure could be underestimated given RV dysfunction.     Elta Guadeloupe, DO   Division of Infectious Diseases   Please contact preferentially via [employer]

## 2023-03-19 NOTE — Progress Notes
 PHYSICAL THERAPY  ASSESSMENT      Name: Paul Benjamin   MRN: [id number]     DOB: 1945-10-21      Age: 77 y.o.  Admission Date: 03/18/2023     LOS: 1 day     Date of Service: 03/19/2023    Mobility  Patient Turn/Position: Chair  Progressive Mobility Level: Walk in hallway  Distance Walked (feet): 80 ft  Level of Assistance: Assist X2  Assistive Device: Walker  Activity Limited By: Fatigue;Weakness    Subjective  Significant Hospital Events: AD polycystic kidney disease, CKD IV, DMT2, anemia of chronic disease, SSS s/p dual chamber PPM (2008), afib s/p ablation (2009), HTN, CAD s/p stenting to LAD (2005), dyslipidemia, OSA compliant with CPAP, and diastolic dysfunction who presented to [place] as a transfer from [facility] and was found to be in shock, possibly multifactorial (cardiogenic due to occlusion of RCA with possible urosepsis) and was taken to cath lab for intervention.  Mental / Cognitive: Alert;Oriented;Cooperative  Pain: No complaint of pain  Persons Present: Spouse;Daughter;Occupational Therapist    Home Living Situation  Lives With: Spouse/significant other  Type of Home: Apartment  Entry Stairs: No stairs  In-Home Stairs: No stairs  Bathroom Setup: Tub/shower unit  Patient Owned Equipment: Walker with wheels;Walker: Rollator  Comments: primarily uses rollator walker, although does not fit through doorway to bathroom, has to lift to fit in bathroom.    Prior Level of Function  Level Of Independence: Independent with ADL and community mobility with device  Required Assist For: Bathing  Comments: washing back    Precautions  Comments: Hx of R shoulder pain (wife states RTC tear), limited ROM in R UE    ROM  ROM Position Assessed: Seated  R LE ROM: WFL  R LE ROM Method: Active  L LE ROM: WFL  L LE ROM Method: Active    Strength  Strength Position Assessed: Seated  Overall Strength: Generalized weakness  Strength Comments: B LE grossly 4/5    Posture/Neurological  Head Control: Independent  Posture: Forward head;Kyphosis    Bed Mobility/Transfer  Bed Mobility: Supine to Sit: Minimal Assist;x2 People  Transfer Type: Sit to/from Stand  Transfer: Assistance Level: From;Bed;To;Bed Side Chair;Minimal Assist;x2 People  Transfer: Assistive Device: Nurse, adult  Transfers: Type Of Assistance: Verbal Cues;Elevated Bed;For Balance;For Safety Considerations  End Of Activity Status: Up in Chair;Instructed Patient to Request Assist with Mobility;Instructed Patient to Use Call Light    Balance  5x Sit to Stand Result (seconds): 0  5X Sit to Stand Comment:: Patient unable to complete sit to stands without 2 person assist, unable to perform test.    Gait  Gait Distance: 80 feet  Gait: Assistance Level: Minimal Assist;of 1st person;Standby Assist;of 2nd person  Gait: Assistive Device: Nurse, adult;Wheelchair Follow  Gait: Descriptors: Forward trunk flexion;Pace: Slow;Decreased foot clearance RLE;Decreased foot clearance LLE;No balance loss;Decreased step length  Comments: Patient veers R and L throughout gait, needs min assist for walker navigation and cues for closer proximity to walker. Vitals remain WNL throughout all mobility and gait.  Activity Limited By: Complaint of Fatigue;Weakness    Education  Persons Educated: Patient/Family  Patient Barriers To Learning: None Noted  Interventions: Repetition of Instructions  Teaching Methods: Verbal Instruction  Patient Response: Verbalized Understanding;More Instruction Required  Topics: Plan/Goals of PT Interventions;Use of Assistive Device/Orthosis;Mobility Progression;Importance of Increasing Activity;Ambulate With Nursing;Equipment Recommendations;Recommend Continued Therapy;Therapy Schedule    Assessment/Progress  Impaired Mobility Due To: Decreased Strength;Safety Concerns;Decreased Activity Tolerance;Deconditioning;Medical  Status Limitation  Impaired Strength Due To: Decreased Activity Tolerance;Deconditioning  Assessment/Progress: Should Improve w/ Continued PT    AM-PAC 6 Clicks Basic Mobility Inpatient  Turning from your back to your side while in a flat bed without using bed rails: A Little  Moving from lying on your back to sitting on the side of a flat bed without using bedrails : A Little  Moving to and from a bed to a chair (including a wheelchair): A Lot  Standing up from a chair using your arms (e.g. wheelchair, or bedside chair): A Lot  To walk in hospital room: A Lot  Climbing 3-5 steps with a railing: Total  Basic Mobility Inpatient Raw Score: 13  Standardized (T-scale) Score: 33.99    Goals  Goal Formulation: With Patient  Time For Goal Achievement: 3 days, To, 5 days  Patient Will Go Supine To/From Sit: Independently  Patient Will Transfer Sit to Stand: Independently  Patient Will Ambulate: 101-150 Feet, w/ Dan Humphreys, w/ Stand By Assist    Plan  Treatment Interventions: Mobility training;Balance activities;Endurance training  Plan Frequency: 5 Days per Week  PT Plan for Next Visit: independence with sit to stands, household distance gait with chair follow    PT Discharge Recommendations  Recommendation: Inpatient setting  Patient Currently Requires Physical Assist With: All mobility;All home functioning ADLs  Comments: Patient may be able to progress to discharge home with 2-3 more therapy sessions, will need to be independent with mobility and ADLs to allow for safe discharge home.    Therapist  [place], [place] [address]  Date  03/19/2023

## 2023-03-19 NOTE — Progress Notes
 CARDIOPULMONARY REHABILITATION  INPATIENT ASSESSMENT    Cardiac Rehabilitation Staff: Jiles Prows Rn Discharge Date:     Demographics  Pre-admit Dx: Coronary Artery Disease Date of Admission: 03/18/2023     Room: HC916/01 DOB:  1946-01-01   Insurance: Primary: Medicare A& B Secondary: Cigna Medicare   Address: [address] [name]  [place] [address]   Patient Phone:  [phone] (home)        ED Contact:     Szuch,Anna (Spouse)  [phone] (Mobile)     ED Phone #: [phone]   CTS: N/A Cardiologist: Mal Misty     Cardiac Procedures and Events     PCI 03/18/23:  2- RCA STENT    Risk Factors    HTN, HLD, Obesity, DM2, Sleep Apnea  BP: 107/61  Height: 177.8 cm (5' 10)  Weight: 96.4 kg (212 lb 8.4 oz)  BMI (Calculated): 30.13      Medical History   has a past medical history of Atrial fibrillation (HCC), CAD (coronary artery disease) (09/30/2006), CHF (congestive heart failure) (HCC) (2010), Coronary artery disease (CAD), anticoagulation, Hypercholesterolemia, Hypertension, Hypertriglyceridemia, Kidney disease, Obesity, Sleep apnea, SSS (sick sinus syndrome) (HCC), and Type II diabetes mellitus (HCC).    Labs  Cholesterol   Date Value Ref Range Status   01/31/2023 84  Final     Triglycerides   Date Value Ref Range Status   01/31/2023 114  Final     HDL   Date Value Ref Range Status   01/31/2023 23  Final     LDL   Date Value Ref Range Status   01/31/2023 39  Final     Hemoglobin A1C   Date Value Ref Range Status   10/17/2021 6.2  Final     Poc Hemoglobin A1C   Date Value Ref Range Status   03/04/2023 5.9 4 - 6 % Final     Troponin-I   Date Value Ref Range Status   08/04/2019 <0.010  Final         Heart Resource Manual Given: 03/19/23     Teaching Completed: 03/19/23     Outpatient Cardiopulmonary Rehabilitation    Outpatient Cardic Rehab: Yes    Referral Faxed to:   [facility] [place]   [phone]   Date Faxed: 03/19/23    Location: [place], [place] [place]   [phone]    If [place], Sent to Staff:  Pilar Plate, Referral Form         Ambrose Mantle, RN  03/19/2023

## 2023-03-19 NOTE — Case Management (ED)
 Case Management Admission Assessment    NAME:Paul Benjamin                          MRN: [id number]             DOB:[birth year]          AGE: 77 y.o.  ADMISSION DATE: 03/18/2023             DAYS ADMITTED: LOS: 1 day      Today?s Date: 03/19/2023    Source of Information: This CM met with pt's daughter, [name] (with patient's permission) for assessment on this date.  Provided contact information and explanation of SW/NCM roles.  Reviewed [facility], Preparing for Discharge, and [facility].  Provided opportunity for questions and discussion. Pt/family encouraged to contact Case Management team with questions and concerns during hospitalization and until patient is able to transition back to the patient's primary care physician.  -Patient and his wife live in [place] in [place].  Patient is independent.  He uses a rollator and has a transfer bench.  Patient is current with [facility].    Heart Failure Assessment Questions:      1.  Do you own a scale and do you weigh yourself daily? yes  2.  Has your doctor given you guidelines for when to reach out to the office? yes  3.  Do you recognize your symptoms indicating that you are becoming overloaded with fluid? no  4.  Do you call your doctor's office or message the office through My Chart when you notice the symptoms starting? My chart  5.  Has your doctor given you instructions to take extra water pills based on your symptoms?no  6.  If home health is involved; have you been instructed to reach out to the home health nurse to assist with communicating with your doctor when symptoms start? yes  7.  Have you been able to follow a low salt diet at home?yes  8.  Please plan for a one week follow-up appointment with the Heart Failure or Primary Care physician; any concerns with transportation for that appointment?  no       Plan  Plan: Assist PRN with SW/NCM Services  -RNCM notified [facility] of admission and faxed updated clinicals.  New orders will be sent upon d/c.      Patient Address/Phone  [address] [name]  [name] [address]  [phone] (home)     Emergency Contact  Extended Emergency Contact Information  Primary Emergency Contact: Stinson,Anna  Address: [address]           [place], [place] [address] [employer]  Home Phone: [phone]  Mobile Phone: [phone]  Relation: Spouse  Secondary Emergency Contact: Barris,Lois  Address: [address]           [place], [place] [address] [place]  Home Phone: [phone]  Work Phone: [phone]  Relation: Daughter    Forensic scientist: Yes, patient has a healthcare directive  Type of Healthcare Directive: Durable power of attorney for healthcare, Psychologist, sport and exercise of Healthcare Directive: Current and verified in document scanning system  Would patient like to fill out a (a new) Editor, commissioning?: No, patient declined  Lawyer (Psych unit only): No, patient does not have a Social research officer, government  Does the Patient  Need Case Management to Arrange Discharge Transport? (ex: facility, ambulance, wheelchair/stretcher, Medicaid, cab, other): No  Will the Patient Use Family Transport?: Yes  Transportation Name, Phone and Availability #1: wife or daughter    Expected Discharge Date  03/22/2023     Living Situation Prior to Admission  Living Arrangements  Type of Residence: Home, independent  Living Arrangements: Spouse/significant other  Financial risk analyst / Tub: Tub/Shower Unit  How many levels in the residence?: 1  Can patient live on one level if needed?: Yes  Does residence have entry and/or inside stairs?: No  Assistance needed prior to admit or anticipated on discharge: No  Level of Function   Prior level of function: Independent  Cognitive Abilities   Cognitive Abilities: Alert and Oriented, Engages in problem solving and planning, Participates in Radio producer Resources  Coverage  Primary Insurance: Medicare  Secondary Insurance: Medicare Supplement  Additional Coverage: None  Medication Coverage    Medication Coverage: Medicare Part D  Medicare Part D Plan: Aetna Silver Scripts  Have you experienced a noticeable increase in your copay costs recently?: No  Are current medications affordable?: Yes  Do You Use a Co-Pay Card or a Medication Assistance Program to Help Manage Medication Costs?: No  Do You Manage Your Own Medications?: Yes  Source of Income   Source Of Income: SSI  Financial Assistance Needed?  no    Psychosocial Needs  Mental Health  Mental Health History: No  Substance Use History  Substance Use History Screen: No  Other  no    Current/Previous Services  PCP  [name], [phone], [phone]  Pharmacy    [facility] [address], [place] - [address] [place] [address] [place]  [place] [address]  Phone: [phone] Fax: [phone]    [facility]  [address]  [place] [address]  Phone: [phone] Fax: [phone]    MEDICATION ASSISTANCE [facility]  [address]  [place] [address]  Phone: [phone] Fax: [phone]    Durable Medical Equipment   Durable Medical Equipment at home: Rollator, CPAP/BiPAP (CGM and pacemaker reader)  Home Health  Receiving home health: Yes  Agency name: [facility]  Would patient use this agency again?: Yes  Hemodialysis or Peritoneal Dialysis  Undergoing hemodialysis or peritoneal dialysis: No  Tube/Enteral Feeds  Receive tube/enteral feeds: No  Infusion  Receive infusions: No  Private Duty  Private duty help used: No  Home and Community Based Services  Home and community based services: No  Ryan White  Ryan White: N/A  Hospice  Hospice: No  Outpatient Therapy  PT: No  OT: No  SLP: No  Skilled Nursing Facility/Nursing Home  SNF: No  NH: No  Inpatient Rehab  IPR: No  Long-Term Acute Care Hospital  LTACH: No  Acute Hospital Stay  Acute Hospital Stay: In the past  Was patient's stay within the last 30 days?: No      Tristan Schroeder, CCM  Integrated Case Manager  [phone]

## 2023-03-19 NOTE — Progress Notes
 He remains critically ill with multiple complex problems and still requiring some NE, but has had some improvement overall post-revascularization.    Guideline-Based Acute Myocardial Infarction Therapies for Type I Non-STEMI:   P2y12 Antagonist:  Yes    ASA:  Yes    Beta Blocker:  No: Hypotension    ACE/ARB/ARNI: (If EF <40%)  No: Hypotension and Renal dysfunction    High Intensity Statin (Atorvastatin 40-80 mg or Rosuvastatin 20-40 mg):  Yes    Evaluation of Left Ventricular EF this admission:  Yes: EF 30% - Is EF < 40%: Yes    Cardiac Rehab Consult - Outpatient Cardiac Rehab:  Yes     I had a lengthy discussion with Dr. Betha Loa of MICU and we are both of the same mind that despite his occluded RCA/AMI, he clearly has urosepsis, which is the main culprit for his shock state.  Will continue aggressive efforts, supportive critical care, close monitoring of kidney function and we will plan to engage urology and ID in his care.    Teaching Physician Attestation:  I have personally interviewed and examined this critically ill patient, have reviewed the documentation, and agree with the assessment and plan of the resident.      I spent >60 minutes (905 - 1008) of direct critical care time on the unit regarding this patient?s care (including taking a history, performing bedside exam, review of radiology, labs, adjusting vasoactive agents or inotropes, talking with patient and/or family about condition and treatment options, reviewing risks/benefits of interventions, discussion with consultants, and documenting in the chart).  15 minutes additionally spent in care coordination.  Laurette Schimke Gladyes Kudo M.D.

## 2023-03-19 NOTE — Progress Notes
 OCCUPATIONAL THERAPY  ASSESSMENT NOTE      Name: Paul Benjamin   MRN: [id number]     DOB: 09-Nov-1945      Age: 77 y.o.  Admission Date: 03/18/2023     LOS: 1 day     Date of Service: 03/19/2023      Mobility  Patient Turn/Position: Chair  Progressive Mobility Level: Walk in hallway  Distance Walked (feet): 80 ft  Level of Assistance: Assist X2  Assistive Device: Walker  Activity Limited By: Fatigue;Weakness    Subjective  Significant Hospital Events: AD polycystic kidney disease, CKD IV, DMT2, anemia of chronic disease, SSS s/p dual chamber PPM (2008), afib s/p ablation (2009), HTN, CAD s/p stenting to LAD (2005), dyslipidemia, OSA compliant with CPAP, and diastolic dysfunction who presented to [place] as a transfer from [facility] and was found to be in shock, possibly multifactorial (cardiogenic due to occlusion of RCA with possible urosepsis) and was taken to cath lab for intervention.  Mental / Cognitive: Alert;Oriented;Cooperative  Pain: No complaint of pain  Persons Present: Spouse;Daughter;Physical Therapist    Home Living Situation  Lives With: Spouse/significant other  Type of Home: Apartment  Entry Stairs: No stairs  In-Home Stairs: No stairs  Bathroom Setup: Tub/shower unit  Patient Owned Equipment: Walker with wheels;Walker: Rollator;Bathing: Transfer tub bench  Comments: primarily uses rollator walker, although does not fit through doorway to bathroom, has to partially fold to fit in one of the  bathrooms.    Prior Level of Function  Level Of Independence: Independent with ADL and community mobility with device  Required Assist For: Bathing  Comments: washing back    Precautions  Comments: Hx of R shoulder pain (wife states RTC tear), limited ROM in R UE    ADL Mobility  Bed Mobility: Supine to Sit: Minimal assist  Transfer Type: Sit to/from stand  Transfer: Assistance Level: From;Bed;To;Bedside chair;Minimal assist;x2 people  Transfer: Assistive Device: Agricultural consultant: Type of Assistance: For safety considerations;For strength deficit  End of Activity Status: Up in chair;Instructed patient to request assist with mobility;Instructed patient to use call light;Nursing notified  Gait Distance: 80 feet  Gait: Assistance Level: Minimal assist;of 1st person;Standby assist;of 2nd person  Gait: Assistive Device: Roller walker;Wheelchair follow    Cognition  Overall Cognitive Status: WFL to Adequately Complete Self Care Tasks Safely  Attention: Awake/Alert    ROM  R UE ROM: Not WFL (decreased due to rotator cuff injury)  L UE ROM: WFL  R LE ROM: WFL  R LE ROM Method: Active  L LE ROM: WFL  L LE ROM Method: Active    UE Strength / Tone  Strength Position Assessed: Seated  R UE Strength: WFL  L UE Strength: WFL  Strength Comments: B LE grossly 4/5    Education  Persons Educated: Patient  Teaching Methods: Verbal Instruction;Demonstration  Patient Response: Verbalized and Demo Understanding  Topics: Role of OT, Goals for Therapy;DME for Home Discharge;ADL Compensatory Techniques  Goal Formulation: With Patient    Assessment  Assessment: Decreased ADL Status;Decreased Endurance;Decreased Self-Care Trans;Decreased High-Level ADLs  Prognosis: Good;w/Cont OT s/p Acute Discharge  Goal Formulation: Patient  Comments: Patient limited by weakness, decreased acitivity tolerance and decreased functional balance. Anticipate patient will continue to benefit from therapeutic intervention.    AM-PAC 6 Clicks Daily Activity Inpatient  Putting on and taking off regular lower body clothes: A Lot  Bathing (Including washing, rinsing, drying): A Lot  Toileting, which includes using toilet,  bedpan, or urinal: Total  Putting on and taking off regular upper body clothing: A Lot  Taking care of personal grooming such as brushing teeth: A Lot  Eating meals: A Little  Daily Activity Raw Score: 12  Standardized (T-scale) Score: 30.6    Plan  OT Frequency: 5x/week  OT Plan for Next Visit: grooming standing at sink, balance training    ADL Goals  Patient Will Perform All ADL's: w/ Stand By Assist    Functional Transfer Goals  Pt Will Perform All Functional Transfers: w/ Stand By Assist    OT Discharge Recommendations  Recommendation: Inpatient setting - Anticipate patient will progress over the next 2-3 sessions. Pending length of stay, pt may progress to being able to discharge home.     Should patient discharge home at this time, would recommend consistent assistance/supervision and the following:  Recommendation for Therapy Post Discharge: Home health  Patient Currently Requires Physical Assist With: All mobility;All personal care ADLs;All home functioning ADLs  Patient Currently Requires Equipment: Owns what is needed    Therapist: Maxcine Ham, OTR/L  Date: 03/19/2023

## 2023-03-19 NOTE — Progress Notes
 03/19/23 0800   Current Cardiac Procedures/Events   Pre Admit Dx Coronary Artery Disease   PCI 03/18/23  (2- RCA Stent)   Risk Factors   Risk Factors Hypertension;Hyperlipidemia;Obesity;Diabetes-type II   Education   Person Instructed Patient   Patient Barriers To Learning None Noted   Interventions/Teaching Methods Verbal Instructions;Written Materials Provided   Patient Response Verbalized Understanding   Topics Home Walking Guidelines;Risk Factor Management - Smoking;Risk Factor Management - Cholesterol;Risk Factor Management - Obesity;Risk Factor Management - Stress;Risk Factor Management - Physical Inactivity;Outpatient Cardiac Rehab Referral Information;Risk Factor Management - Diabetes;Risk Factor Management - Blood Pressure;Reasons to call your doctor   Teaching Completed 03/19/23   Heart Resource Manual Given 03/19/23   Outpatient Cardiopulmonary Rehab   OPCR Yes   Location [place], [place] [place]   [phone]   Date Faxed 03/19/23   If [place], Sent to Staff [place];Pre-Cert;Referral Form

## 2023-03-19 NOTE — Progress Notes
 Brief Urology Note    Rylee Toole is a 77 y.o. man with a past medical history of AD polycystic kidney disease, HFpEF, CKD Stage IV, T2DM, anemia of chronic disease, history of complicated UTI with urinary retention and bilateral pyelonephritis, urge incontinence, chronic indwelling foley catheter with plans to transition to suprapubic in February of 2025, recent history of lower extremity cellulitis, historically low blood pressure with transition from Coreg to metoprolol, SSS s/p dual chamber PPM (2008), afib s/p ablation (2009), HTN, CAD s/p stenting x2 to LAD and proximal portion of diagonal branch (2005), dyslipidemia, and OSA compliant with CPAP who presented to [place] as a transfer from [facility] and was found to be in septic shock most likely from the GU tract with increasing troponin and reported chest pain. He was taken to cath lab on 12/23 with stenting x2 to the proximal RCA. He was then transferred to the CICU for monitoring s/p PCI on IV cefepime.     Urology engaged due to UTI in the setting of chronic indwelling foley catheter. To help reduce infection risk, make sure catheter is draining appropriately and exchanged monthly (last exchanged early December). Patient currently has appointment schedule with Dr. Littie Deeds in 2/24 for discussion of SPT placement, will inquire for earlier appointment per request. 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     Urology will sign off at this time, please reach out if any further concerns.     Rea College, MD  Urology Resident

## 2023-03-19 NOTE — Progress Notes
 CCU Progress Note      Name: Paul Benjamin        Birthday: July 09, 1945                                MRN: [id number]  Admission Date: 03/18/2023                                                LOS: 1 day      Brief Hospital Course     Paul Benjamin is a 77 y.o. man with a past medical history of AD polycystic kidney disease, HFpEF, CKD Stage IV, T2DM, anemia of chronic disease, history of complicated UTI with urinary retention and bilateral pyelonephritis, urge incontinence, chronic indwelling foley catheter with plans to transition to suprapubic in February of 2025, recent history of lower extremity cellulitis, historically low blood pressure with transition from Coreg to metoprolol, SSS s/p dual chamber PPM (2008), afib s/p ablation (2009), HTN, CAD s/p stenting x2 to LAD and proximal portion of diagonal branch (2005), dyslipidemia, and OSA compliant with CPAP who presented to [place] as a transfer from [facility] and was found to be in septic shock most likely from the GU tract with increasing troponin and reported chest pain. He was taken to cath lab on 12/23 with stenting x2 to the proximal RCA. He was then transferred to the CICU for monitoring s/p PCI on IV cefepime.     Interval update 03/19/2023:  > Patient has required spot dosing of Levophed overnight and into this morning to sustain MAP goal > 65  > Can consider repeat TTE prior to hospital discharge to reassess EF s/p PCI   > Continue to hold PTA metoprolol due to low BP. Will not start ACEi/ARB and spironolactone due to current blood pressure as well as advanced CKD. Patient would not be a good candidate for SGLUT2 inhibitor due to chronic indwelling catheter with history of complicated UTIs  > Continue IV cefepime. Consult infectious disease for antibiotic recommendations for recurrent, complicated UTIs   > Consult urology for management of chronic indwelling foley catheter   > Continue DAPT and high dose statin   > Encourage PO hydration, hold off on IVF for now     Assessment & Plan     Principal Problem:    Septic shock (HCC)  Active Problems:    CAD (coronary artery disease)    Mixed dyslipidemia    CKD stage 4 due to type 2 diabetes mellitus (HCC)    Overactive bladder (OAB)    Chronic indwelling Foley catheter    NSTEMI (non-ST elevated myocardial infarction) (HCC)    Acute heart failure with reduced ejection fraction (HFrEF, <= 40%) (HCC)    Complicated urinary tract infection    Lower extremity cellulitis    Ischemic cardiomyopathy    NEURO/PSYCH  #Restless Leg Syndrome  - PTA Medications: Pramipexole 1.5mg  qHS and gabapentin 100mg  qHS  Plan:  > Continue PTA pramipexole 1.5mg  qHS and gabapentin 100mg  qHS        PULMONARY  #OSA, compliant with CPAP  - Admission ABG: 7.38/30/189  - Admission CXR: No consolidation, pleural effusion, or pneumothorax  Plan:  > Continue home CPAP         CARDIOVASCULAR  #NSTEMI s/p PCI with stenting  x2 to proximal RCA (03/18/23)   #CAD s/p PCI to LAD and proximal portion of diagonal branch (2005)  #HFpEF  #SSS s/p dual chamber PPM (2008)  #Afib s/p ablation (2009)  #Mixed dyslipidemia  - Pressors: levophed titrated to goal MAP > 65   - Admission EKG: Sinus rhythm with first degree AV block and left axis deviation  - Post cath EKG: Sinus rhythm with first degree AV block and left axis deviation  - Admission Echo: moderately reduced LV systolic function with EF 30-35% (previously 56% 01/2023); new regional wall motion abnormalities (akinesis of inferoseptal and inferolateral walls and apical segments; hypokinesis of basal inferoseptal, basal inferior, mid anteroseptal, and mid inferior walls); moderately to severely dilated RV with mild RV systolic dysfunction; known bicuspid aortic valve  - Trop: 3 (OSH); 12,690 (admission); 14,792 (4 hours post admission)  - 12/23 PCI with stenting x 2 to proximal RCA  Lab Results   Component Value Date    CHOL 76 03/19/2023    TRIG 123 03/19/2023    HDL 26 (L) 03/19/2023 LDL 38 03/19/2023    VLDL 16.1 03/19/2023    NONHDLCHOL 50 03/19/2023    CHOLHDLC 4 06/05/2021     Guideline Directed Medical Therapy PTA Inpatient Changes   ACEI/ARB No  Renal dysfunction    ARNI No Renal dysfunction    BB No (Hypotension)    SGLT-2 Inhibitor No UTIs, current or frequent    Mineralocorticoid Receptor Antagonist No Renal dysfunction    Hydralazine/Nitrate No Hypotension    Ivabradine No; Hypotension    Heart Rhythm Management Therapy Yes (PPM)    Anticoagulation None with history of Afib/flutter    Cardiac Rehab Evaluation for LVEF < or equal to 40% Yes; Date ordered: 12/23    7-Day post hospital follow up scheduled within 24-48 hours of discharge No Discharge not expected within the next 24-48 hours       - PTA meds: Atorvastatin 40mg  qHS, furosemide 40mg  qAM, metoprolol succinate 12.5mg  daily, and sublingual nitro  Plan:  > Continue increased dose of atorvastatin 80 mg qHS  > Pending hemoglobin A1c   > Continue to hold PTA furosemide   > Continue to hold PTA metoprolol due to low BP  > Continue to hold ACEi/ARB and spironolactone due to low blood pressure requiring pressor support. Patient would not be a good candidate for SGLUT2 inhibitor due to chronic indwelling catheter with history of complicated UTIs  > Continue aspirin 81mg  daily and ticagrelor 90mg  BID  > Continue monitoring patient on telemetry. Replace lytes prn, K > 4, Mg > 2   > Device interrogation ordered today     FEN/GI  #Hx constipation  #Hypovitaminosis D  - PTA Medications:Vitamin D3 50,000U weekly,  Miralax PRN, and Senokot PRN  Plan:  > Will hold PTA vitamin D3 for now (last dose 12/18)  > Continue PTA Miralax and Senokot-S prn        RENAL  #AD Polycystic Kidney Disease  #CKD Stage IV  - Etiology of CKD most likely combination of T2DM (well controlled), ADPKD, and HFpEF   - Recent admission with AKI due to urinary retention and bilateral pyelonephritis  - Baseline creatinine 2.5-3.0   - Indwelling foley replaced 03/18/23. Plans to get suprapubic catheter placed in February 2025  - Follows with Dr. Philomena Course in the outpatient setting   Plan:  > Replace electrolytes PRN   > Continue to monitor kidney function as the patient is high risk for contrast induced  nephropathy within the next 24-48 hours     #Chronic indwelling foley catheter   #Overactive bladder/Urge Incontinence   #Hx urinary retention   - Patient has a chronic indwelling catheter for urge incontinence/OAB and urinary retention  - Follows with urology in the outpatient setting   Plan:   > Urology consulted, appreciate recs      ENDOCRINE  #DMT2 with long-term insulin use  #Hypothyroidism  - 09/2021 A1c 6.2  - 03/19/23: TSH 1.94  - PTA Medications: Levothyroxine qAM, Ozempic 1mg  weekly (last dose on 12/20), and aspart LDCF   Plan:  > Repeat A1c pending  > POC blood glucose monitoring ordered ACHS with LDCF  > Continue PTA levothyroxine qAM  > Continue holding PTA Ozempic        HEME/ONC  #Anemia of chronic disease  - Baseline hemoglobin 9.5-10.5  03/19/2023: Iron 27 ?g/dL (L)  41/66/0630: Iron Binding-TIBC 167 mcg/dL (L)  16/03/930: % Saturation 16 % (L)  03/19/2023: Ferritin 554 ng/mL (H)   - Admission hemoglobin 9.8  Plan:  > Monitor HGB with daily AM CBC  > Transfuse if Hgb <7 or <8 with active bleeding  > DVT Ppx: Heparin subq        ID  #Septic shock, most likely GU source   #History of complicated UTIs   #Concern for right shin cellulitis  #Intertrigo complicated by candidal infection  - Recent history of UTI treated with Augmentin transitioned to Keflex in early December, bilateral pyelonephritis treated with Zosyn transitioned to Augmentin early November, and doxycycline 100 mg BID for cellulitis 11/5 - 11/14  - Admission UA showing packed WBCs, 20-50 RBCs, moderate bacteria but was nitrite negative. 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   - Admission CXR without signs of consolidation  - 03/18/23 Blood Cx: Pending  - 03/18/2023 Urine Cx: Pending  - Antibiotics:               Meropenem (12/23 single dose at outside hospital)               Cefepime (12/23 - current)  Plan:  > Pending respiratory culture, blood culture, and urine cultures   > Continue clotrimazole topical cream BID for candidal intertrigo  > Wound team following  > Continue cefepime (12/23 - )  > ID consulted, appreciate recs         MSK/DERM  #Gout  #Venous stasis changes bilateral lower extremities   Plan:  > Continue PTA allopurinol 100mg  BID     LDA/PROPHYLAXIS  Lines: PIV x 1,  Tubes: Right radial sheath  Urinary Catheter:  Yes; Retain foley due to:  Urinary retention  Antibiotic Usage:  Yes; Infection present or suspected:  Wound/Skin/Tissue;  Cellulitis and Candidal intertrigo  GU/GI;  Urinary tract infection (UTI)  VTE ppx: SCDs, subq heparin  GI:  Not indicated  Bowel regimen: miralax and senna  Diet: Cardiac diet   Code status: FULL CODE     Patient seen and discussed with attending physician, Dr. Beather Arbour.    Chancy Milroy, DO   Internal Medicine PGY-1      Subjective     Overnight events: NAEO. Patient has had no chest pain since PCI and denies fever/chills. Has NAC.     ROS:   Review of Systems   Constitutional:  Negative for chills and fever.   Respiratory:  Negative for cough and shortness of breath.    Cardiovascular:  Positive for leg swelling. Negative for chest pain and  palpitations.   Genitourinary:  Negative for dysuria.         Objective     Scheduled Meds:allopurinoL (ZYLOPRIM) tablet 100 mg, 100 mg, Oral, BID  aspirin chewable tablet 81 mg, 81 mg, Oral, QDAY  atorvastatin (LIPITOR) tablet 80 mg, 80 mg, Oral, QHS  cefepime (MAXIPIME) IVP 2 g, 2 g, Intravenous, Q24H*  clotrimazole (LOTRIMIN) 1 % topical cream, , Topical, BID  gabapentin (NEURONTIN) capsule 100 mg, 100 mg, Oral, QHS  heparin (porcine) PF syringe 5,000 Units, 5,000 Units, Subcutaneous, Q8H  insulin aspart (U-100) (NOVOLOG FLEXPEN U-100 INSULIN) injection PEN 0-6 Units, 0-6 Units, Subcutaneous, ACHS (22)  levothyroxine (SYNTHROID) tablet 75 mcg, 75 mcg, Oral, QDAY  pramipexole (MIRAPEX) tablet 1.5 mg, 1.5 mg, Oral, QHS  ticagrelor (BRILINTA) tablet 90 mg, 90 mg, Oral, BID    Continuous Infusions:   norepinephrine (LEVOPHED) 4 mg in dextrose 5% (D5W) 250 mL IV drip (std conc) 0.03 mcg/kg/min (03/19/23 0643)     PRN and Respiratory Meds:dextrose 50% (D50) IV PRN, diphenhydrAMINE HCL Q4H PRN **OR** diphenhydrAMINE HCL Q4H PRN, ondansetron (ZOFRAN) IV Q6H PRN, polyethylene glycol 3350 QDAY PRN, sennosides-docusate sodium BID PRN                           Vital Signs: Last Filed                 Vital Signs: 24 Hour Range   BP: 107/61 (12/24 0700)  Temp: 36.1 ?C (97 ?F) (12/24 0400)  Pulse: 73 (12/24 0700)  Respirations: 14 PER MINUTE (12/24 0700)  SpO2: 96 % (12/24 0700)  O2%: 21 % (12/24 0106)  O2 Device: None (Room air) (12/24 0600)  O2 Liter Flow: 3 Lpm (12/23 2200)  Height: 177.8 cm (5' 10) (12/23 1600) BP: (87-125)/(55-94)   Temp:  [35.7 ?C (96.2 ?F)-36.5 ?C (97.7 ?F)]   Pulse:  [70-82]   Respirations:  [11 PER MINUTE-23 PER MINUTE]   SpO2:  [96 %-100 %]   O2%:  [21 %]   O2 Device: None (Room air)  O2 Liter Flow: 3 Lpm   Intensity Pain Scale (Self Report): 3 (03/18/23 1600) Vitals:    03/18/23 1400 03/18/23 1600 03/19/23 0400   Weight: 95.3 kg (210 lb) 95.3 kg (210 lb) 96.4 kg (212 lb 8.4 oz)         Physical Exam  Constitutional:       General: He is not in acute distress.  HENT:      Head: Normocephalic and atraumatic.      Nose: Nose normal.      Mouth/Throat:      Mouth: Mucous membranes are moist.      Pharynx: Oropharynx is clear.   Eyes:      General: No scleral icterus.     Extraocular Movements: Extraocular movements intact.      Conjunctiva/sclera: Conjunctivae normal.   Cardiovascular:      Rate and Rhythm: Normal rate and regular rhythm.      Heart sounds: Normal heart sounds. No murmur heard.  Pulmonary:      Effort: Pulmonary effort is normal.      Breath sounds: Normal breath sounds. No stridor. No wheezing.   Abdominal:      General: Abdomen is flat. Bowel sounds are normal.      Palpations: Abdomen is soft.      Tenderness: There is no abdominal tenderness.   Musculoskeletal:      Right  lower leg: Edema present.      Left lower leg: Edema present.   Skin:     Findings: Erythema (right shin + warmth) present.   Neurological:      General: No focal deficit present.      Mental Status: He is alert. Mental status is at baseline.   Psychiatric:         Mood and Affect: Mood normal.         Thought Content: Thought content normal.          Artificial Airway   None       Ventilator/Respiratory Therapy  No     Vent Weaning   Not applicable    Laboratory:  Recent Labs     03/18/23  1415 03/19/23  0402   NA 145 145   K 3.9 4.0   CL 115* 116*   CO2 20* 21   GAP 10 8   BUN 62* 58*   CR 2.71* 2.46*   GLU 119* 137*   CA 8.8 8.5   ALBUMIN 3.2* 3.0*   MG 2.3 2.0   PO4 3.5 3.9   TSH  --  1.94       Recent Labs     03/18/23  1415 03/18/23  1416 03/18/23  1605 03/18/23  2205 03/19/23  0402   WBC  --  5.60  --   --  5.20   HGB  --  9.8*  --   --  8.5*   HCT  --  30.2*  --   --  25.7*   PLTCT  --  181  --   --  168   PT  --   --  14.1*  --   --    INR  --   --  1.3*  --   --    PTT  --   --  54.6* >200.0*  --    AST 75*  --   --   --  235*   ALT 31  --   --   --  51   ALKPHOS 87  --   --   --  75      Estimated Creatinine Clearance: 28.4 mL/min (A) (based on SCr of 2.46 mg/dL (H)).  Vitals:    03/18/23 1400 03/18/23 1600 03/19/23 0400   Weight: 95.3 kg (210 lb) 95.3 kg (210 lb) 96.4 kg (212 lb 8.4 oz)      Recent Labs     03/18/23  1423   PHART 7.38   PO2ART 189*         Pertinent radiology reviewed.    Malnutrition Details:                                        Active Wounds

## 2023-03-20 VITALS — BP 92/54 | HR 70

## 2023-03-20 VITALS — BP 110/64 | HR 76 | Temp 97.70000°F

## 2023-03-20 VITALS — BP 110/62 | HR 77

## 2023-03-20 VITALS — BP 104/60 | HR 75

## 2023-03-20 VITALS — BP 108/60 | HR 72 | Temp 97.90000°F

## 2023-03-20 VITALS — BP 103/59 | HR 71

## 2023-03-20 VITALS — BP 105/65 | HR 78

## 2023-03-20 VITALS — BP 112/66 | HR 77 | Temp 97.50000°F

## 2023-03-20 VITALS — BP 112/67 | HR 71

## 2023-03-20 VITALS — BP 104/68 | HR 76

## 2023-03-20 VITALS — BP 97/59 | HR 78

## 2023-03-20 VITALS — BP 106/58 | HR 83

## 2023-03-20 VITALS — BP 123/71 | HR 76 | Temp 97.60000°F

## 2023-03-20 VITALS — BP 109/58 | HR 74

## 2023-03-20 VITALS — Wt 212.7 lb

## 2023-03-20 VITALS — BP 93/58 | HR 72 | Temp 97.40000°F

## 2023-03-20 VITALS — BP 99/58 | HR 71

## 2023-03-20 LAB — CBC AND DIFF
~~LOC~~ BKR HEMATOCRIT: 25 % — ABNORMAL LOW (ref 40.0–50.0)
~~LOC~~ BKR HEMOGLOBIN: 8.3 g/dL — ABNORMAL LOW (ref 13.5–16.5)
~~LOC~~ BKR MCH: 28 pg — ABNORMAL HIGH (ref 26.0–34.0)
~~LOC~~ BKR MCHC: 32 g/dL — ABNORMAL HIGH (ref 32.0–36.0)
~~LOC~~ BKR MCV: 86 fL — ABNORMAL LOW (ref 80.0–100.0)
~~LOC~~ BKR MDW (MONOCYTE DISTRIBUTION WIDTH): 21 mg/dL — ABNORMAL HIGH (ref 0.40–<=20.6)
~~LOC~~ BKR RBC COUNT: 2.9 10*6/uL — ABNORMAL LOW (ref 4.40–5.50)
~~LOC~~ BKR RDW: 19 % — ABNORMAL HIGH (ref 11.0–15.0)
~~LOC~~ BKR WBC COUNT: 4.7 10*3/uL — ABNORMAL HIGH (ref 4.50–11.00)

## 2023-03-20 LAB — COMPREHENSIVE METABOLIC PANEL
~~LOC~~ BKR ALBUMIN: 2.8 g/dL — ABNORMAL LOW (ref 3.5–5.0)
~~LOC~~ BKR ALK PHOSPHATASE: 72 U/L — ABNORMAL HIGH (ref 25–110)
~~LOC~~ BKR ALT: 47 U/L — ABNORMAL LOW (ref 7–56)
~~LOC~~ BKR ANION GAP: 10 10*3/uL — ABNORMAL LOW (ref 3–12)
~~LOC~~ BKR AST: 130 U/L — ABNORMAL HIGH (ref 7–40)
~~LOC~~ BKR BLD UREA NITROGEN: 58 mg/dL — ABNORMAL HIGH (ref 7–25)
~~LOC~~ BKR CALCIUM: 8.4 mg/dL — ABNORMAL LOW (ref 8.5–10.6)
~~LOC~~ BKR CO2: 17 mmol/L — ABNORMAL LOW (ref 21–30)
~~LOC~~ BKR CREATININE: 2.5 mg/dL — ABNORMAL HIGH (ref 0.40–1.24)
~~LOC~~ BKR GLOMERULAR FILTRATION RATE (GFR): 25 mL/min — ABNORMAL LOW (ref >60–0.20)
~~LOC~~ BKR TOTAL BILIRUBIN: 0.4 mg/dL (ref 0.2–1.3)
~~LOC~~ BKR TOTAL PROTEIN: 5.5 g/dL — ABNORMAL LOW (ref 6.0–8.0)

## 2023-03-20 LAB — POC GLUCOSE
~~LOC~~ BKR POC GLUCOSE: 126 mg/dL — ABNORMAL HIGH (ref 70–100)
~~LOC~~ BKR POC GLUCOSE: 136 mg/dL — ABNORMAL HIGH (ref 70–100)
~~LOC~~ BKR POC GLUCOSE: 148 mg/dL — ABNORMAL HIGH (ref 70–100)
~~LOC~~ BKR POC GLUCOSE: 172 mg/dL — ABNORMAL HIGH (ref 70–100)

## 2023-03-20 LAB — CULTURE-URINE W/SENSITIVITY: ~~LOC~~ BKR URINE CULTURE: 10 — AB

## 2023-03-20 MED ORDER — CEFTRIAXONE INJ 2GM IVP
2 g | INTRAVENOUS | 0 refills | Status: DC
Start: 2023-03-20 — End: 2023-03-24
  Administered 2023-03-20 – 2023-03-24 (×5): 2 g via INTRAVENOUS

## 2023-03-20 MED ORDER — ACETAMINOPHEN 325 MG PO TAB
650 mg | ORAL | 0 refills | Status: DC | PRN
Start: 2023-03-20 — End: 2023-03-26
  Administered 2023-03-20 – 2023-03-21 (×2): 650 mg via ORAL

## 2023-03-20 MED ORDER — POLYETHYLENE GLYCOL 3350 17 GRAM PO PWPK
1 | Freq: Every day | ORAL | 0 refills | Status: DC
Start: 2023-03-20 — End: 2023-03-26
  Administered 2023-03-20: 15:00:00 17 g via ORAL

## 2023-03-20 MED ORDER — METOPROLOL TARTRATE 25 MG PO TAB
12.5 mg | Freq: Two times a day (BID) | ORAL | 0 refills | Status: CP
Start: 2023-03-20 — End: ?
  Administered 2023-03-20 – 2023-03-22 (×4): 12.5 mg via ORAL

## 2023-03-20 MED ORDER — SENNOSIDES-DOCUSATE SODIUM 8.6-50 MG PO TAB
1 | Freq: Two times a day (BID) | ORAL | 0 refills | Status: DC
Start: 2023-03-20 — End: 2023-03-26
  Administered 2023-03-20 – 2023-03-23 (×6): 1 via ORAL

## 2023-03-20 NOTE — Progress Notes
 CV Staff Note    Impressions:      Septic shock/urosepsis    CAD (coronary artery disease)    NSTEMI (non-ST elevated myocardial infarction)     Complicated urinary tract infection    Ischemic cardiomyopathy and Acute heart failure with reduced ejection fraction (HFrEF, <= 40%)    Mixed dyslipidemia    CKD stage 4     Type 2 diabetes mellitus    Chronic indwelling Foley catheter    Lower extremity cellulitis     He remains ill but has had some improvement, with remaining off NE since yesterday afternoon with hemodynamically stability.      We will try to see if he can tolerate any beta-blocker without hypotension.    Serratia has been isolated as source of urosepsis.    He is pacing mimimally in the RV at this time since his RCA intervention.    The complexity of medical decision making remains high due to multi-system issues present.  There are significant challenges in decision making due to his underlying cardiac abnormalities and their interrelationships with other active medical conditions.       Laurette Schimke Melea Prezioso M.D., [facility]   Division Director of Comprehensive Cardiovascular Medicine  Department of Cardiovascular Medicine, [facility]

## 2023-03-20 NOTE — Progress Notes
 CCU Progress Note      Name: Paul Benjamin        Birthday: November 07, 1945                                MRN: [id number]  Admission Date: 03/18/2023                                                LOS: 2 days      Brief Hospital Course     Paul Benjamin is a 77 y.o. man with a past medical history of AD polycystic kidney disease, HFpEF, CKD Stage IV, T2DM, anemia of chronic disease, history of complicated UTI with urinary retention and bilateral pyelonephritis, urge incontinence, chronic indwelling foley catheter with plans to transition to suprapubic in February of 2025, recent history of lower extremity cellulitis, historically low blood pressure with transition from Coreg to metoprolol, SSS s/p dual chamber PPM (2008), afib s/p ablation (2009), HTN, CAD s/p stenting x2 to LAD and proximal portion of diagonal branch (2005), dyslipidemia, and OSA compliant with CPAP who presented to [facility] as a transfer from [facility] and was found to be in septic shock most likely from the GU tract with increasing troponin and reported chest pain. He was taken to cath lab on 12/23 with stenting x2 to the proximal RCA. He was then transferred to the CICU for monitoring s/p PCI on IV cefepime and Levophed.     Urine culture positive for pan sensitive Serratia marcescens. Antibiotics de-escalated to Rocephin on 12/25. GDMT limited due to advanced CKD. Initiation of metoprolol tartrate 12.5 mg PO BID 12/25 as the patient's BP stable off of pressor support.     Interval update 03/20/2023:  > Patient has been off Levophed since early afternoon 12/24. Discontinue pressor today. Remove central line. Downgrade to telemetry   > Tentative plan for repeat TTE in the outpatient setting to reassess LV function  > Start metoprolol tartrate 12.5 mg PO BID with plan to transition to Toprol 25 mg PO daily as tolerated   > Will not start ACEi/ARB and spironolactone due to advanced CKD. Patient would not be a good candidate for SGLUT2 inhibitor due to chronic indwelling catheter with history of complicated UTIs  > Deescalate antibiotics to Rocephin today based off of sensitivities   > Device interrogation showing fairly significant increase in RV pacing over the last 1-2 days most likely 2/2 to NSTEMI. Telemetry shows infrequent V pacing with predominant atrial pacing    Assessment & Plan     Principal Problem:    Septic shock (HCC)  Active Problems:    CAD (coronary artery disease)    Mixed dyslipidemia    CKD stage 4 due to type 2 diabetes mellitus (HCC)    Overactive bladder (OAB)    Chronic indwelling Foley catheter    NSTEMI (non-ST elevated myocardial infarction) (HCC)    Acute heart failure with reduced ejection fraction (HFrEF, <= 40%) (HCC)    Complicated urinary tract infection    Lower extremity cellulitis    Ischemic cardiomyopathy    NEURO/PSYCH  #Restless Leg Syndrome  - PTA Medications: Pramipexole 1.5mg  qHS and gabapentin 100mg  qHS  Plan:  > Continue PTA pramipexole 1.5mg  qHS and gabapentin 100mg  qHS  PULMONARY  #OSA, compliant with CPAP  - Admission ABG: 7.38/30/189  - Admission CXR: No consolidation, pleural effusion, or pneumothorax  Plan:  > Continue home CPAP         CARDIOVASCULAR  #NSTEMI s/p PCI with stenting x2 to proximal RCA (03/18/23)   #CAD s/p PCI to LAD and proximal portion of diagonal branch (2005)  #HFpEF  #SSS s/p dual chamber PPM (2008)  #Afib s/p ablation (2009)  #Mixed dyslipidemia  - Pressors: levophed titrated to goal MAP > 65   - Admission EKG: Sinus rhythm with first degree AV block and left axis deviation  - Post cath EKG: Sinus rhythm with first degree AV block and left axis deviation  - Admission Echo: moderately reduced LV systolic function with EF 30-35% (previously 56% 01/2023); new regional wall motion abnormalities (akinesis of inferoseptal and inferolateral walls and apical segments; hypokinesis of basal inferoseptal, basal inferior, mid anteroseptal, and mid inferior walls); moderately to severely dilated RV with mild RV systolic dysfunction; known bicuspid aortic valve  - Trop: 3 (OSH); 12,690 (admission); 14,792 (4 hours post admission)  - 12/23 PCI with stenting x 2 to proximal RCA  Lab Results   Component Value Date    CHOL 76 03/19/2023    TRIG 123 03/19/2023    HDL 26 (L) 03/19/2023    LDL 38 03/19/2023    VLDL 96.0 03/19/2023    NONHDLCHOL 50 03/19/2023    CHOLHDLC 4 06/05/2021     Guideline Directed Medical Therapy PTA Inpatient Changes   ACEI/ARB No  Renal dysfunction    ARNI No Renal dysfunction    BB No (Hypotension)    SGLT-2 Inhibitor No UTIs, current or frequent    Mineralocorticoid Receptor Antagonist No Renal dysfunction    Hydralazine/Nitrate No Hypotension    Ivabradine No; Hypotension    Heart Rhythm Management Therapy Yes (PPM)    Anticoagulation None with history of Afib/flutter    Cardiac Rehab Evaluation for LVEF < or equal to 40% Yes; Date ordered: 12/23    7-Day post hospital follow up scheduled within 24-48 hours of discharge No Discharge not expected within the next 24-48 hours       - PTA meds: Atorvastatin 40mg  qHS, furosemide 40mg  qAM, metoprolol succinate 12.5mg  daily, and sublingual nitro  - Device interrogation: 9.1% RV paced (programmed MVP-->RV pacing likely r/t intermittent second degree AVB). Cardiac compass shows fairly significant increase in RV pacing over the last 1-2days  - HgA1c: 5.8%   Plan:  > Continue increased dose of atorvastatin 80 mg qHS  > Continue to hold PTA furosemide   > Patient has been off Levophed since early afternoon 12/24. Discontinue pressor today. Remove central line. Downgrade to telemetry   > Tentative plan for repeat TTE in the outpatient setting to reassess LV function  > Start metoprolol tartrate 12.5 mg PO BID with plan to transition to Toprol 25 mg PO daily as tolerated   > Continue to hold ACEi/ARB and spironolactone due to low blood pressure requiring pressor support. Patient would not be a good candidate for SGLUT2 inhibitor due to chronic indwelling catheter with history of complicated UTIs  > Continue aspirin 81mg  daily and ticagrelor 90mg  BID  > Continue monitoring patient on telemetry. Replace lytes prn, K > 4, Mg > 2      FEN/GI  #Hx constipation  #Hypovitaminosis D  - PTA Medications:Vitamin D3 50,000U weekly,  Miralax PRN, and Senokot PRN  Plan:  > Will hold PTA vitamin D3  for now (last dose 12/18)  > Continue PTA Miralax and Senokot-S scheduled         RENAL  #AD Polycystic Kidney Disease  #CKD Stage IV  - Etiology of CKD most likely combination of T2DM (well controlled), ADPKD, and HFpEF   - Recent admission with AKI due to urinary retention and bilateral pyelonephritis  - Baseline creatinine 2.5-3.0   - Indwelling foley replaced 03/18/23. Plans to get suprapubic catheter placed in February 2025  - Follows with Dr. Philomena Course in the outpatient setting   Plan:  > Replace electrolytes PRN   > Continue to monitor kidney function as the patient is high risk for contrast induced nephropathy within the next 24-48 hours     #Chronic indwelling foley catheter   #Overactive bladder/Urge Incontinence   #Hx urinary retention   - Patient has a chronic indwelling catheter for urge incontinence/OAB and urinary retention  - Follows with urology in the outpatient setting   Plan:   > Appreciate urology recs       ENDOCRINE  #DMT2 with long-term insulin use  #Hypothyroidism  - 03/19/23 A1c 5.8  - 03/19/23: TSH 1.94  - PTA Medications: Levothyroxine qAM, Ozempic 1mg  weekly (last dose on 12/20), and aspart LDCF   Plan:  > POC blood glucose monitoring ordered ACHS with LDCF  > Continue PTA levothyroxine qAM  > Continue holding PTA Ozempic        HEME/ONC  #Anemia of chronic disease  - Baseline hemoglobin 9.5-10.5  03/19/2023: Iron 27 ?g/dL (L)  56/21/3086: Iron Binding-TIBC 167 mcg/dL (L)  57/84/6962: % Saturation 16 % (L)  03/19/2023: Ferritin 554 ng/mL (H)   - Admission hemoglobin 9.8  Plan:  > Monitor HGB with daily AM CBC  > Transfuse if Hgb <7 or <8 with active bleeding  > DVT Ppx: Heparin subq  > Consider IV iron in outpatient setting         ID  #Urosepsis   #History of complicated UTIs   #Concern for right shin cellulitis  #Intertrigo complicated by candidal infection  - Recent history of UTI treated with Augmentin transitioned to Keflex in early December, bilateral pyelonephritis treated with Zosyn transitioned to Augmentin early November, and doxycycline 100 mg BID for cellulitis 11/5 - 11/14  - Admission UA showing packed WBCs, 20-50 RBCs, moderate bacteria but was nitrite negative. 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   - Admission CXR without signs of consolidation  - 03/18/23 Blood Cx: NGTD  - 03/18/2023 Urine Cx: Serratia marcescens   - Antibiotics:               Meropenem (12/23 single dose at outside hospital)               Cefepime (12/23 - 12/25)    Rocephin (12/25 - )   Plan:  > Continue clotrimazole topical cream BID for candidal intertrigo  > Wound team following  > Deescalate antibiotics to Rocephin today based off of sensitivities   > ID following, appreciate recs         MSK/DERM  #Gout  #Venous stasis changes bilateral lower extremities   Plan:  > Continue PTA allopurinol 100mg  BID     LDA/PROPHYLAXIS  Lines: PIV x 1,  Tubes: Right radial sheath  Urinary Catheter:  Yes; Retain foley due to:  Urinary retention  Antibiotic Usage:  Yes; Infection present or suspected:  Wound/Skin/Tissue;  Cellulitis and Candidal intertrigo  GU/GI;  Urinary tract infection (UTI)  VTE  ppx: SCDs, subq heparin  GI:  Not indicated  Bowel regimen: miralax and senna  Diet: Cardiac diet   Code status: FULL CODE     Patient seen and discussed with attending physician, Dr. Beather Arbour.    Chancy Milroy, DO   Internal Medicine PGY-1      Subjective     Overnight events: NAEO. Patient has no acute concerns and feels better today.     ROS:   Review of Systems   Constitutional:  Negative for chills and fever.   Respiratory:  Negative for cough and shortness of breath.    Cardiovascular:  Negative for chest pain and palpitations.   Gastrointestinal:  Positive for constipation. Negative for abdominal pain and diarrhea.   Genitourinary:  Negative for dysuria.         Objective     Scheduled Meds:allopurinoL (ZYLOPRIM) tablet 100 mg, 100 mg, Oral, BID  aspirin chewable tablet 81 mg, 81 mg, Oral, QDAY  atorvastatin (LIPITOR) tablet 80 mg, 80 mg, Oral, QHS  cefepime (MAXIPIME) IVP 2 g, 2 g, Intravenous, Q24H*  clotrimazole (LOTRIMIN) 1 % topical cream, , Topical, BID  gabapentin (NEURONTIN) capsule 100 mg, 100 mg, Oral, QHS  heparin (porcine) PF syringe 5,000 Units, 5,000 Units, Subcutaneous, Q8H  insulin aspart (U-100) (NOVOLOG FLEXPEN U-100 INSULIN) injection PEN 0-6 Units, 0-6 Units, Subcutaneous, ACHS (22)  ketoconazole (NIZORAL) 2 % topical cream, , Topical, BID  levothyroxine (SYNTHROID) tablet 75 mcg, 75 mcg, Oral, QDAY  polyethylene glycol 3350 (MIRALAX) packet 17 g, 1 packet, Oral, QDAY  pramipexole (MIRAPEX) tablet 1.5 mg, 1.5 mg, Oral, QHS  sennosides-docusate sodium (SENOKOT-S) tablet 1 tablet, 1 tablet, Oral, BID  ticagrelor (BRILINTA) tablet 90 mg, 90 mg, Oral, BID    Continuous Infusions:   norepinephrine (LEVOPHED) 4 mg in dextrose 5% (D5W) 250 mL IV drip (std conc) Stopped (03/19/23 1306)     PRN and Respiratory Meds:dextrose 50% (D50) IV PRN, diphenhydrAMINE HCL Q4H PRN **OR** diphenhydrAMINE HCL Q4H PRN, ondansetron (ZOFRAN) IV Q6H PRN                           Vital Signs: Last Filed                 Vital Signs: 24 Hour Range   BP: 105/65 (12/25 0700)  Temp: 36.6 ?C (97.9 ?F) (12/25 0400)  Pulse: 78 (12/25 0700)  Respirations: 18 PER MINUTE (12/25 0700)  SpO2: 98 % (12/25 0700)  O2%: 21 % (12/24 2219)  O2 Device: None (Room air) (12/25 0700) BP: (87-121)/(54-79)   Temp:  [36.3 ?C (97.3 ?F)-36.6 ?C (97.9 ?F)]   Pulse:  [70-79]   Respirations:  [13 PER MINUTE-20 PER MINUTE]   SpO2:  [96 %-100 %]   O2%:  [21 %]   O2 Device: None (Room air)     Vitals:    03/18/23 1600 03/19/23 0400 03/20/23 0440   Weight: 95.3 kg (210 lb) 96.4 kg (212 lb 8.4 oz) 96.5 kg (212 lb 11.9 oz)         Physical Exam  Constitutional:       General: He is not in acute distress.  HENT:      Head: Normocephalic and atraumatic.      Nose: Nose normal.      Mouth/Throat:      Mouth: Mucous membranes are moist.      Pharynx: Oropharynx is clear.   Eyes:  General: No scleral icterus.     Extraocular Movements: Extraocular movements intact.      Conjunctiva/sclera: Conjunctivae normal.   Cardiovascular:      Rate and Rhythm: Normal rate and regular rhythm.      Heart sounds: Normal heart sounds. No murmur heard.  Pulmonary:      Effort: Pulmonary effort is normal.      Breath sounds: Normal breath sounds. No stridor. No wheezing.   Abdominal:      General: Abdomen is flat. Bowel sounds are normal.      Palpations: Abdomen is soft.      Tenderness: There is no abdominal tenderness.   Skin:     Findings: Erythema (right shin + warmth) present.      Comments: Chronic venous stasis changes in bilateral LE    Neurological:      General: No focal deficit present.      Mental Status: He is alert. Mental status is at baseline.   Psychiatric:         Mood and Affect: Mood normal.         Thought Content: Thought content normal.          Artificial Airway   None       Ventilator/Respiratory Therapy  No     Vent Weaning   Not applicable    Laboratory:  Recent Labs     03/18/23  1415 03/19/23  0402 03/20/23  0351   NA 145 145 144   K 3.9 4.0 4.2   CL 115* 116* 117*   CO2 20* 21 17*   GAP 10 8 10    BUN 62* 58* 58*   CR 2.71* 2.46* 2.57*   GLU 119* 137* 86   CA 8.8 8.5 8.4*   ALBUMIN 3.2* 3.0* 2.8*   MG 2.3 2.0 2.0   PO4 3.5 3.9 3.8   HGBA1C  --  5.8*  --    TSH  --  1.94  --        Recent Labs     03/18/23  1415 03/18/23  1416 03/18/23  1605 03/18/23  2205 03/19/23  0402 03/20/23  0351   WBC  --  5.60  --   --  5.20 4.70   HGB  --  9.8*  --   --  8.5* 8.3*   HCT  --  30.2*  --   --  25.7* 25.4*   PLTCT  --  181  --   --  168 148*   PT  --   --  14.1*  --   --   --    INR  --   --  1.3*  --   --   --    PTT  --   --  54.6* >200.0*  --   --    AST 75*  --   --   --  235* 130*   ALT 31  --   --   --  51 47   ALKPHOS 87  --   --   --  75 72      Estimated Creatinine Clearance: 27.2 mL/min (A) (based on SCr of 2.57 mg/dL (H)).  Vitals:    03/18/23 1600 03/19/23 0400 03/20/23 0440   Weight: 95.3 kg (210 lb) 96.4 kg (212 lb 8.4 oz) 96.5 kg (212 lb 11.9 oz)      Recent Labs     03/18/23  1423   PHART 7.38  PO2ART 189*         Pertinent radiology reviewed.    Malnutrition Details:                                        Active Wounds

## 2023-03-20 NOTE — Progress Notes
 Infectious Diseases Progress    Today's Date:  03/20/2023  Admission Date: 03/18/2023    Reason for this consultation: Septic shock, likely urinary source, with history of complicated UTI     Assessment:     Shock, septic +/-cardiogenic  Complicated UTI  12/5 Serratia marcescens bacteriuria, treated with augmentin->cephalexin  12/22 UC at [facility] is still pending per verbal from lab  12/22 NGTD BCs  12/23 pyuria at [facility], Serratia marcescens - pan-S    NSTEMI  HFrEF  H/o CAD s/p PCI x 2  - low BP reading of 70s/50s at home, chest tightness  - Presented to outside ER morning of 12/23, found to have elevated troponin of 3.933 (0 - .033), EKG with AV block, prolonged QTC  - Trop:  12,690 --> 14,792 post transfer  - POCUS notable for RV, IVC dilation; 12/23 ECHO with EF now 30-35%  - Given clinical presentation, ongoing chest pain, hypotension requiring pressors and evidence of regional myocardial injury on echocardiogram, he was emergently taken to the Cath Lab. Coronary angiography revealed multivessel disease with culprit lesion thought to be a thrombotic occlusion of a large, dominant RCA. 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 A Urethral catheter placed    A fib s/p ablation - not on AC  SSS s/p PPM (09/2006)    H/o COVID-19 pneumonia with acute hypoxic respiratory failure 2020     DM type 2  CKD  HTN  HLD  Obesity      Chronic LE edema w/ venous stasis with prior cellulitis    Abx allergies/intolerance  -TMP/SMX: break out in hives per patient  -Cefuorixime - Reported hives  -Clindamycin - Reported rash  - Cipro - Reports rash    Recommendations:     Agree with switch to ceftriaxone - will need 7 days treatment of UTI - favor staying on ceftriaxone while inpt/at least the first 5 days then if he is ready for dc prior to end date (and if BC remain neg) can do cefpodoxime for remainder. Quinolone or sulfa would have better urinary levels, but allergies preclude   Follow-up BC  Will tFU [facility] cultures tomorrow  Monitor for now daily cbc/diff, CMP given antimicrobial drug therapy which requires intensive monitoring for toxicity  Keep RLE above level of the heart while at rest  Ketoconazole cream for toes bid x 2 wks  A&D ointment for leg abrasions prn      will follow     History of Present Illness    Paul Benjamin is a 77 y.o. patient who I am seeing today for septic shock with potential urinary source, an acute infection that may pose a threat to life or bodily function and necessitates antimicrobial drug therapy that requires intensive monitoring for toxicity.     Paul Benjamin has a PMHx as listed below. Patient's history or present illness was obtained via review and summarization of old medical records and via discussion with patient and family at bedside.     Afebrile, BP low 100s  No n/v/d  Nose bleed this am, otherwise denies new symptoms  WBC 4.7  Creat 2.5  Primary team note reviewed    Antimicrobial Start date End date   Cefepime  12/24 12/25   rocephin 12/25  Estimated Creatinine Clearance: 27.2 mL/min (A) (based on SCr of 2.57 mg/dL (H)).      Medications   Scheduled Meds:allopurinoL (ZYLOPRIM) tablet 100 mg, 100 mg, Oral, BID  aspirin chewable tablet 81 mg, 81 mg, Oral, QDAY  atorvastatin (LIPITOR) tablet 80 mg, 80 mg, Oral, QHS  cefTRIAXone (ROCEPHIN) IVP 2 g, 2 g, Intravenous, Q24H*  clotrimazole (LOTRIMIN) 1 % topical cream, , Topical, BID  gabapentin (NEURONTIN) capsule 100 mg, 100 mg, Oral, QHS  heparin (porcine) PF syringe 5,000 Units, 5,000 Units, Subcutaneous, Q8H  insulin aspart (U-100) (NOVOLOG FLEXPEN U-100 INSULIN) injection PEN 0-6 Units, 0-6 Units, Subcutaneous, ACHS (22)  ketoconazole (NIZORAL) 2 % topical cream, , Topical, BID  levothyroxine (SYNTHROID) tablet 75 mcg, 75 mcg, Oral, QDAY  metoprolol tartrate tablet 12.5 mg, 12.5 mg, Oral, BID  polyethylene glycol 3350 (MIRALAX) packet 17 g, 1 packet, Oral, QDAY  pramipexole (MIRAPEX) tablet 1.5 mg, 1.5 mg, Oral, QHS  sennosides-docusate sodium (SENOKOT-S) tablet 1 tablet, 1 tablet, Oral, BID  ticagrelor (BRILINTA) tablet 90 mg, 90 mg, Oral, BID    Continuous Infusions:      PRN and Respiratory Meds:acetaminophen Q4H PRN, dextrose 50% (D50) IV PRN, diphenhydrAMINE HCL Q4H PRN **OR** diphenhydrAMINE HCL Q4H PRN, ondansetron (ZOFRAN) IV Q6H PRN      Physical Examination                          Vital Signs: Most Recent                  Vital Signs: 24 Hour Range   BP: 104/68 (12/25 1300)  Temp: 36.5 ?C (97.7 ?F) (12/25 1200)  Pulse: 76 (12/25 1300)  Respirations: 13 PER MINUTE (12/25 1300)  SpO2: 98 % (12/25 1300)  O2%: 21 % (12/24 2219)  O2 Device: None (Room air) (12/25 0800) BP: (92-121)/(54-79)   Temp:  [36.3 ?C (97.4 ?F)-36.6 ?C (97.9 ?F)]   Pulse:  [70-83]   Respirations:  [13 PER MINUTE-19 PER MINUTE]   SpO2:  [96 %-100 %]   O2%:  [21 %]   O2 Device: None (Room air)     General appearance: awake, alert, nad  HENT: no oral lesions/thrush, epistaxis on R  Lungs: no wheezing, rhonchi, rales   Heart: Regular rhythm, normal rate, with no murmur  Abdomen: soft, non-tender, normoactive bowel sounds, foley clear yellow urine  Ext:  No clubbing, cyanosis,1+ LE edema; BL venous stasis changes but R medial calf warm/red and few abrasions both shins  Skin: no other rash    Lines: no erythema    Laboratory (reviewed and or/ ordered by me)    Hematology  Recent Labs     03/18/23  1416 03/18/23  1605 03/18/23  2205 03/19/23  0402 03/20/23  0351   WBC 5.60  --   --  5.20 4.70   HGB 9.8*  --   --  8.5* 8.3*   HCT 30.2*  --   --  25.7* 25.4*   PLTCT 181  --   --  168 148*   PTT  --  54.6* >200.0*  --   --    INR  --  1.3*  --   --   --      Chemistry  Recent Labs     03/18/23  1415 03/19/23  0402 03/20/23  0351   NA 145 145 144   K 3.9 4.0 4.2   CL 115* 116* 117*  CO2 20* 21 17* BUN 62* 58* 58*   CR 2.71* 2.46* 2.57*   GFR 23* 26* 25*   GLU 119* 137* 86   CA 8.8 8.5 8.4*   PO4 3.5 3.9 3.8   ALBUMIN 3.2* 3.0* 2.8*   ALKPHOS 87 75 72   AST 75* 235* 130*   ALT 31 51 47   TOTBILI 0.4 0.5 0.4     Microbiology, Radiology and other Diagnostics Review   Microbiology data reviewed and/or ordered by me.    Pertinent radiology images were visualized and reviewed and/or ordered by me, and the dates and associated independent interpretation is incorporated into the above HPI/assessment.       12/23 ECHO  Interpretation Summary  Show Result ComparisonModerate reduced LV systolic function EF estimated 30-35%.  Wall motion abnormality described below.  Wall motion is suggestive of possible LAD territory injury/ischemia +/- multivessel disease.  No LV thrombus identified on the current study.  The RV does appear to be moderately to severely dilated.  Mild RV systolic dysfunction.    More focal distal free wall hypokinesis appreciated.    RA/RV pacer wire identified.  Mild tricuspid valve regurgitation.  Known bicuspid aortic valve.  Fusion of the left and right coronary cusp.  Visually leaflet excursion is limited.  No evidence of significant aortic valve stenosis.  Peak velocity 1.4 m/s, mean gradient 4 mmHg.  DVI 0.43.  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  The aortic root measures 3.7 cm.  No pericardial effusion.  CVP >15 mmHg.  PA systolic pressure estimated at 31 mmHg.  However PA systolic pressure could be underestimated given RV dysfunction.     Elta Guadeloupe, DO   Division of Infectious Diseases   Please contact preferentially via Voalte

## 2023-03-20 NOTE — Progress Notes
 END OF SHIFT/PLAN OF CARE NURSING NOTE [facility]    Admission Date: 03/18/2023  Length of Stay: LOS: 2 days    Acute events and nursing interventions: ~1130: Pt reports Right-sided mid back pain 15 minutes after administration of ceftriaxone IVP and removal of R IJ venous sheath. Breath sounds clear and consistent with baseline. VSS. No other assessment changes. Chancy Milroy, MD notified. Tylenol ordered and given. Upon reassessment, pt reports that pain has resolved.     ~1300: PT and pt's daughter report nosebleed from pt's left nostril. Bleeding stopped after ~20 minutes; 1400 dose of subQ heparin held. Carder, MD notified.     Pt able to safely transfer from bed to commode, and subsequently to chair with RN x1 and walker.     Communication with providers: Chancy Milroy, MD, Mal Misty, MD.    Restraints:  No     Restraint Goal: Patient will be free from injury while physically restrained.  See Docflowsheet for restraint documentation, interventions, education, etc.    Patient Education  Quality/safety:  CAUTI prevention, CLABSI prevention, Fall risk, Pain scale , Incontinence management, Non Pharmacological pain management, Pharmacological pain management, Pressure injury care/prevention, and Wound/incision care  Cardiac: Cardiac diagnoses/condition (specify which) , Cardiac medications (specify which) , Cardiac post procedure care (specify which) , Cardiac procedure/test education (specify which) , and Importance of cardiac diet/nutrition   Other:   Learners: Patient, Family, and Significant Other  Method/materials used: Verbal teaching  Response to learning: Some Evidence of Learning, Needs Reinforcement    Patient Goal(s):  Patient will Report progressive increase in activity tolerance  by discharge date         Patient will Progress toward pain management goal  by the end of shift   Other:

## 2023-03-21 VITALS — HR 72

## 2023-03-21 VITALS — BP 110/60 | HR 70 | Temp 97.60000°F

## 2023-03-21 VITALS — Wt 212.5 lb

## 2023-03-21 VITALS — BP 115/66 | HR 71 | Temp 97.40000°F

## 2023-03-21 VITALS — HR 85

## 2023-03-21 VITALS — HR 75

## 2023-03-21 VITALS — BP 108/67 | HR 70 | Temp 97.70000°F

## 2023-03-21 VITALS — BP 115/65 | HR 76 | Temp 97.60000°F

## 2023-03-21 VITALS — BP 125/68 | HR 69 | Temp 97.80000°F

## 2023-03-21 VITALS — BP 93/54 | HR 69 | Temp 97.70000°F

## 2023-03-21 VITALS — HR 81

## 2023-03-21 LAB — POC GLUCOSE
~~LOC~~ BKR POC GLUCOSE: 153 mg/dL — ABNORMAL HIGH (ref 70–100)
~~LOC~~ BKR POC GLUCOSE: 101 mg/dL — ABNORMAL HIGH (ref 70–100)
~~LOC~~ BKR POC GLUCOSE: 109 mg/dL — ABNORMAL HIGH (ref 70–100)

## 2023-03-21 LAB — RETICULOCYTE COUNT
~~LOC~~ BKR ABSOLUTE RETIC CT: 54 10*3/uL (ref 30.0–94.0)
~~LOC~~ BKR CORRECTED RETIC: 1 %
~~LOC~~ BKR UNCORRECTED RETIC: 2.1 % — ABNORMAL HIGH (ref 0.5–2.0)

## 2023-03-21 LAB — CBC AND DIFF
~~LOC~~ BKR ABSOLUTE LYMPH COUNT: 0.5 10*3/uL — ABNORMAL LOW (ref 1.00–4.80)
~~LOC~~ BKR ABSOLUTE MONO COUNT: 0.3 10*3/uL — ABNORMAL HIGH (ref 0.00–0.80)
~~LOC~~ BKR ABSOLUTE NEUTROPHIL: 3.4 10*3/uL — ABNORMAL LOW (ref 1.80–7.00)
~~LOC~~ BKR BASOPHILS %: 1 % (ref 0–2)
~~LOC~~ BKR EOSINOPHILS %: 2 % — ABNORMAL LOW (ref 0–5)
~~LOC~~ BKR HEMATOCRIT: 22 % — ABNORMAL LOW (ref 40.0–50.0)
~~LOC~~ BKR HEMOGLOBIN: 7.5 g/dL — ABNORMAL LOW (ref 13.5–16.5)
~~LOC~~ BKR LYMPHOCYTES %: 12 % — ABNORMAL LOW (ref 24–44)
~~LOC~~ BKR MCH: 28 pg — ABNORMAL HIGH (ref 26.0–34.0)
~~LOC~~ BKR MCHC: 34 g/dL — ABNORMAL HIGH (ref 32.0–36.0)
~~LOC~~ BKR MCV: 84 fL (ref 80.0–100.0)
~~LOC~~ BKR MONOCYTES %: 7 % — ABNORMAL LOW (ref 4–12)
~~LOC~~ BKR MPV: 7 fL — ABNORMAL LOW (ref 7.0–11.0)
~~LOC~~ BKR PLATELET COUNT: 139 10*3/uL — ABNORMAL LOW (ref 150–400)
~~LOC~~ BKR RBC COUNT: 2.6 10*6/uL — ABNORMAL LOW (ref 4.40–5.50)
~~LOC~~ BKR RDW: 18 % — ABNORMAL HIGH (ref 11.0–15.0)
~~LOC~~ BKR WBC COUNT: 4.4 10*3/uL — ABNORMAL LOW (ref 4.50–11.00)

## 2023-03-21 LAB — ECG 12-LEAD
P-R INTERVAL: 312 ms — ABNORMAL LOW (ref 36.0–45.0)
Q-T INTERVAL: 476 ms
Q-T INTERVAL: 456 ms (ref 26.0–34.0)
QRS DURATION: 102 ms
QRS DURATION: 104 ms (ref 80.0–100.0)
QTC CALCULATION (BAZETT): 521 ms
QTC CALCULATION (BAZETT): 502 ms (ref 32.0–36.0)
R AXIS: -36 degrees
R AXIS: -40 degrees — ABNORMAL HIGH (ref 150–400)
T AXIS: -38 degrees
T AXIS: -13 degrees — ABNORMAL LOW (ref 7.0–11.0)
VENTRICULAR RATE: 72 {beats}/min
VENTRICULAR RATE: 73 {beats}/min — ABNORMAL LOW (ref 12.0–15.0)

## 2023-03-21 LAB — COMPREHENSIVE METABOLIC PANEL
~~LOC~~ BKR ALT: 38 U/L (ref 7–56)
~~LOC~~ BKR ANION GAP: 8 % — ABNORMAL HIGH (ref 3–12)
~~LOC~~ BKR BLD UREA NITROGEN: 60 mg/dL — ABNORMAL HIGH (ref 7–25)
~~LOC~~ BKR CO2: 18 mmol/L — ABNORMAL LOW (ref 21–30)
~~LOC~~ BKR GLOMERULAR FILTRATION RATE (GFR): 22 mL/min — ABNORMAL LOW (ref >60–5)

## 2023-03-21 MED ORDER — PANTOPRAZOLE 40 MG IV SOLR
80 mg | Freq: Once | INTRAVENOUS | 0 refills | Status: CP
Start: 2023-03-21 — End: ?
  Administered 2023-03-21: 17:00:00 80 mg via INTRAVENOUS

## 2023-03-21 MED ORDER — PANTOPRAZOLE 40 MG IV SOLR
40 mg | Freq: Two times a day (BID) | INTRAVENOUS | 0 refills | Status: DC
Start: 2023-03-21 — End: 2023-03-25
  Administered 2023-03-22 – 2023-03-25 (×7): 40 mg via INTRAVENOUS

## 2023-03-21 MED ORDER — SODIUM CHLORIDE 0.65 % NA SPRA
2 | Freq: Every day | NASAL | 0 refills | Status: DC
Start: 2023-03-21 — End: 2023-03-22
  Administered 2023-03-22: 04:00:00 2 via NASAL

## 2023-03-21 MED ORDER — OXYMETAZOLINE 0.05 % NA SPRY
2 | Freq: Two times a day (BID) | NASAL | 0 refills | Status: DC
Start: 2023-03-21 — End: 2023-03-22
  Administered 2023-03-21: 20:00:00 2 via NASAL

## 2023-03-21 MED ORDER — METOPROLOL SUCCINATE 25 MG PO TB24
25 mg | Freq: Every day | ORAL | 0 refills | Status: DC
Start: 2023-03-21 — End: 2023-03-26
  Administered 2023-03-22 – 2023-03-25 (×4): 25 mg via ORAL

## 2023-03-21 NOTE — Consults
 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    ASSESSMENT AND PLAN:     ACTIVE PROBLEMS:      Suspected melena  Acute on chronic normocytic normochromic anemia  Coronary artery disease-status post PCI-03/18/2023 on DAPT  Septic shock-urosepsis resolved  Atrial fibrillation status post ablation  Chronic kidney disease-stage IIIb  Heart failure with reduced ejection fraction-EF 30 to 35%.      Paul Benjamin is a 77 y.o. male was admitted to [facility] on 03/18/2023 for complaints of chest pain with shock.  Patient was diagnosed to have coronary artery disease, status post PCI-03/18/2023, stenting to the right coronary artery done.  He was also found to have urosepsis, currently resolved.  Gastroenterology has been consulted for ongoing melena with hemoglobin drop.    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  Denies any hematochezia or hematemesis in the past.  Patient states he has not been evaluated with scopes has he has been having ongoing issues with anemia secondary to his chronic kidney disease and he does attribute it to that.  Patient does give history of iron infusions in November during hospitalizations.  Denies any history of blood transfusions.  Has been on iron supplements for more than a year.  He is also on erythropoietin infusions for anemia in November.  As per chart review, patient has been having intermittent black stools for the past 1 to 2 days.  But stools are hard and formed.  Patient initially presented with septic shock however pressor requirement has improved and is currently off pressors since 03/19/2023.  Denies being on any blood thinners at home.  No history of any NSAID intake.  No history of any prior GI surgeries.  No family history of colon cancer.  Occasionally consumes alcohol.  Denies any tobacco abuse or substance abuse.      Vital stable.  No abdominal tenderness noted.      Relevant investigations:  CBC on 03/21/2023 shows hemoglobin of 7.5, baseline noted around 10 g/dL on 40/05/4740.  Normocytic normochromic anemia.  Transferrin saturation of 16%.  Blood urea nitrogen-60, creatinine-2.86 at baseline.  No prior abdominal imaging available.  No prior EGD.  Last colonoscopy in 5 years back, was found to have polyps.    ASSESSMENT  77 year old male with past medical history significant for coronary artery disease status post PCI, atrial fibrillation status post ablation, sick sinus syndrome on permanent pacemaker, currently presented with septic shock and acute coronary syndrome.  Patient has been stented to the right coronary artery on 03/18/2023.  Gastroenterology has been consulted for suspected melena in the setting of hemoglobin drop.  Hemoglobin was 10 g on presentation, downtrending to 7.5 with intermittent questionable melena.  Patient remains hemodynamically stable.  Etiology of underlying anemia could be multifactorial in the setting of coronary artery disease, chronic kidney disease.  However would like to rule out underlying peptic ulcer disease, angioectasia, esophagitis, gastritis as patient needs to be on dual antiplatelet therapy in view of recent stenting.    RECOMMENDATIONS:  Will plan for EGD tentatively on 03/25/2023 for assessment of questionable melena.  Patient is currently on aspirin and ticagrelor.  Kindly keep the patient n.p.o.[ midnight prior to the procedure].  Monitor hemoglobin every 8 hourly. In case of significant hemoglobin drop/ brisk GI bleed/ hemodynamic instability kindly page the GI fellow on call.  Transfuse to keep hemoglobin more than 7 g/dL [more than 8 g/dL in case of cardiac disease], platelets more than 50,000, INR less than 2  Kindly normalize electrolytes prior to procedure  To continue pantoprazole 40 mg IV BD  GI will continue to follow.  Kindly call us with any questions     Plan of care has been communicated to the patient and the patient's relatives.  The benefits and risks of the procedure has been clearly explained along with alternatives.  They voiced understanding and all questions have been answered.      [name] [name], MD  PGY - 4 Fellow, Division of Gastroenterology, Hepatology & Motility  Available on Voalte me   03/21/2023 11:41 AM     Patient seen/discussed with staff Dr. Francisco Capuchin. Kindly see their addendum for any changes.  ---------------------------------------------------------------------------------------------------------------------------------------------------------------------------------  PMH:  Past Medical History:    Atrial fibrillation (HCC)    CAD (coronary artery disease)    CHF (congestive heart failure) (HCC)    Coronary artery disease (CAD)    HX: anticoagulation    Hypercholesterolemia    Hypertension    Hypertriglyceridemia    Kidney disease    Obesity    Sleep apnea    SSS (sick sinus syndrome) (HCC)    Type II diabetes mellitus (HCC)       Current medications:  No current facility-administered medications on file prior to encounter.     Current Outpatient Medications on File Prior to Encounter   Medication Sig Dispense Refill    allopurinol (ZYLOPRIM) 100 mg tablet Take one tablet by mouth twice daily.      atorvastatin (LIPITOR) 40 mg tablet Take one tablet by mouth daily. 90 tablet 3    blood-glucose sensor (FREESTYLE LIBRE 3 SENSOR) sensor device Use one each as directed daily.      CHOLEcalciferoL (vitamin D3) (OPTIMAL D3) 50,000 units capsule Take one capsule by mouth every 7 days. Wednesday.      docusate (COLACE) 100 mg capsule Take one capsule by mouth daily.      furosemide (LASIX) 20 mg tablet Take two tablets by mouth every morning. 180 tablet 3    gabapentin (NEURONTIN) 100 mg capsule Take one capsule by mouth at bedtime daily.      insulin aspart (  U-100) (NOVOLOG FLEXPEN U-100 INSULIN) 100 unit/mL (3 mL) PEN Inject zero Units to six Units under the skin three times daily with meals. Correction Factor if: BS 181-220=2 units, BS 221-260=4 units, BS 261-300=6 units, BS 301-350=8 units, BS 351-400=10 units, BS >400=12 units  Indications: type 2 diabetes mellitus      levothyroxine (SYNTHROID) 75 mcg tablet Take one tablet by mouth daily.      metoprolol succinate XL (TOPROL XL) 25 mg extended release tablet Take one-half tablet by mouth daily. Indications: high blood pressure 60 tablet 0    nitroglycerin (NITROSTAT) 0.4 mg tablet Place 1 tablet under tongue every 5 minutes as needed for Chest Pain. 25 tablet 1    polyethylene glycol 3350 (MIRALAX) 17 g packet Take one packet by mouth daily as needed (Constipation - Second Line). Indications: constipation      pramipexole (MIRAPEX) 1.5 mg PO Tab Take one tablet by mouth at bedtime daily.      semaglutide (OZEMPIC) 1 mg/dose (4 mg/3 mL) injection PEN Inject 1 mg under the skin every 7 days. 9 mL 2    sennosides-docusate sodium (SENOKOT-S) 8.6/50 mg tablet Take one tablet by mouth daily as needed (Constipation - First Line). Indications: constipation         PSH:  Surgical History:   Procedure Laterality Date    PACEMAKER PLACEMENT  09/30/2006    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    Performed by Kathreen Cornfield, MD at [facility] EP LAB    Removal Permanent Pacemaker N/A 01/17/2016    Performed by Kathreen Cornfield, MD at [facility] EP LAB    BURR HOLE      CARDIAC CATHERIZATION      CORONARY STENT PLACEMENT      HX APPENDECTOMY         SH:  Social History     Socioeconomic History    Marital status: Married   Occupational History     Employer: [employer]     Comment: retired   Tobacco Use    Smoking status: Former     Current packs/day: 0.00     Average packs/day: 1 pack/day for 4.0 years (4.0 ttl pk-yrs)     Types: Cigarettes     Start date: 03/26/1962     Quit date: 03/26/1966     Years since quitting: 57.0    Smokeless tobacco: Never   Vaping Use    Vaping status: Never Used   Substance and Sexual Activity    Alcohol use: No     Comment: rarely    Drug use: Never    Sexual activity: Never       FH:  Family History   Problem Relation Name Age of Onset    Asthma Father Jomarie Longs     Cardiovascular Father Jomarie Longs     Hypertension Mother Malena Catholic     High Cholesterol Mother Malena Catholic        Review of Systems:  Please see HPI for additional pertinent documentation    OBJECTIVE:     Physical Exam:  Vitals:    03/21/23 0101 03/21/23 0400 03/21/23 0431 03/21/23 0727   BP:  93/54  115/66   BP Source:  Arm, Left Upper  Arm, Left Upper   Pulse: 72 69  71   Temp:  36.5 ?C (97.7 ?F)  36.3 ?C (97.4 ?F)   SpO2: 97% 95%  99%   O2 Device: CPAP/BiPAP None (Room air)  None (Room air)   O2  Liter Flow:       Weight:   96.4 kg (212 lb 8.4 oz)    Height:           General appearance  alert, cooperative, no distress, appears stated age   Head  Normocephalic, without obvious abnormality, atraumatic   Eyes conjunctivae/corneas clear. EOM's intact. pupils equally round, accommodation normal.   Nose Nares normal. No drainage or sinus tenderness.   Throat Lips, mucosa, and tongue normal. Teeth and gums normal   Neck supple, symmetrical, trachea midline, and no JVD   Back   symmetric, no curvature. ROM normal. No CVA tenderness   Lungs   clear to auscultation bilaterally   Chest wall  no tenderness   Heart  regular rate and rhythm, S1, S2 normal, no murmur, click, rub or gallop   Abdomen   soft, non-tender. Bowel sounds normal. No masses,  No organomegaly   Extremities extremities normal, atraumatic, no cyanosis or edema   Pulses 2+ and symmetric   Skin Skin color, texture, turgor normal. No rashes or lesions   Neurologic Normal     IMAGING AND OTHER PERTINENT LABS REVIEWED IN EPIC.    Voice recognition software was used for this dictation and grammatorical errors may arise despite review. Feel free to contact me for any clarifications.

## 2023-03-21 NOTE — Progress Notes
 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    Worsening anemia is concerning--IV PPI and GI consult for endoscopy  He has been on ozempic PTA which may complicate sedation plan for any endoscopy  He needs uninterrupted dual antiplatelet therapy (DAPT) given MI and fresh stent  GDMT remains very limited due to his severe CKD, but no signs of decompensated heart failure at present    Long discussion with pt and family about his complex, multiple issues.  All questions answered.     The complexity of medical decision making remains high due to multi-system issues present.  There are significant challenges in decision making due to his underlying cardiac abnormalities and their interrelationships with other active medical conditions.

## 2023-03-21 NOTE — Progress Notes
 RT Adult Assessment Note    NAME:Paul Benjamin             MRN: [id number]             DOB:1946-02-16          AGE: 77 y.o.  ADMISSION DATE: 03/18/2023             DAYS ADMITTED: LOS: 3 days    Additional Comments:  Impressions of the patient: NAD, AA&O  Intervention(s)/outcome(s): re-eval completed      Vital Signs:  Pulse: 81  RR: 18 PER MINUTE  SpO2: 98 %  O2 Device: None (Room air)  Liter Flow:    O2%: 21 %    Breath Sounds:   Right Apex Breath Sounds: Clear (Implies normal)  Right Base Breath Sounds: Decreased  Left Apex Breath Sounds: Clear (Implies normal)  Left Base Breath Sounds: Decreased  Respiratory Effort:   Respiratory Effort/Pattern: Unlabored  Comments:

## 2023-03-21 NOTE — Progress Notes
 Chaplain Note:    Patient is Catholic.  Chaplain visited to offer prayer, support and sacramental care.   Patient's wife and two daughters were present at bedside.  Patient appeared calm and hopeful but concerned about his health and asked for prayer and sacramental care for healing.  Patient moved to HC529.  Patient has a great support from his family.  Chaplain offered active listening, support, prayer and provided the Sacrament of the Sick / Last Rites for peace, hope and healing.  Patient appreciated for visit.  Continue available for support and prayer.     The spiritual care team is available as needed, 24/7, through the campus switchboard [phone]). For a response within 24 hours, please submit an order in O2 for a chaplain consult.

## 2023-03-21 NOTE — Progress Notes
 Infectious Diseases Progress    Today's Date:  03/21/2023  Admission Date: 03/18/2023    Reason for this consultation: Septic shock, likely urinary source, with history of complicated UTI     Assessment:     Shock, septic +/-cardiogenic  Complicated UTI  12/5 Serratia marcescens bacteriuria, treated with augmentin->cephalexin  12/22 UC at [facility] is still pending per verbal from lab  12/22 NGTD BCs  12/23 pyuria at [place], Serratia marcescens - pan-S    NSTEMI  HFrEF  H/o CAD s/p PCI x 2  - low BP reading of 70s/50s at home, chest tightness  - Presented to outside ER morning of 12/23, found to have elevated troponin of 3.933 (0 - .033), EKG with AV block, prolonged QTC  - Trop:  12,690 --> 14,792 post transfer  - POCUS notable for RV, IVC dilation; 12/23 ECHO with EF now 30-35%  - Given clinical presentation, ongoing chest pain, hypotension requiring pressors and evidence of regional myocardial injury on echocardiogram, he was emergently taken to the Cath Lab. Coronary angiography revealed multivessel disease with culprit lesion thought to be a thrombotic occlusion of a large, dominant RCA. 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 A Urethral catheter placed    A fib s/p ablation - not on AC  SSS s/p PPM (09/2006)    H/o COVID-19 pneumonia with acute hypoxic respiratory failure 2020     DM type 2  CKD  HTN  HLD  Obesity      Chronic LE edema w/ venous stasis with prior cellulitis    Abx allergies/intolerance  -TMP/SMX: break out in hives per patient  -Cefuorixime - Reported hives  -Clindamycin - Reported rash  - Cipro - Reports rash    Recommendations:     Cont.ceftriaxone - will need 7 days treatment of UTI - (12/24- 12/30) favor staying on ceftriaxone while inpt/at least the first 5 days then if he is ready for dc prior to end date (and if [facility] remain neg) can do cefpodoxime for remainder. Quinolone or sulfa would have better urinary levels, but allergies preclude   Follow-up BC  Will FU [facility] cultures tomorrow  Monitor for now daily cbc/diff, CMP given antimicrobial drug therapy which requires intensive monitoring for toxicity  Keep RLE above level of the heart while at rest  Ketoconazole cream for toes bid x 2 wks  A&D ointment for leg abrasions prn  GI to do EGD 12/30      will follow     History of Present Illness    Paul Benjamin is a 77 y.o. patient who I am seeing today for septic shock with potential urinary source, an acute infection that may pose a threat to life or bodily function and necessitates antimicrobial drug therapy that requires intensive monitoring for toxicity.     Paul Benjamin has a PMHx as listed below. Patient's history or present illness was obtained via review and summarization of old medical records and via discussion with patient and family at bedside.     Afebrile, moved to floor  No n/v/d; having ? Black stools - eval by GI  Nose bleed again x 2 today , otherwise denies new symptoms  WBC 4.4  Creat 2.8  Primary team note reviewed    Antimicrobial Start date End date   Cefepime  12/24 12/25  rocephin 12/25                             Estimated Creatinine Clearance: 24.4 mL/min (A) (based on SCr of 2.86 mg/dL (H)).      Medications   Scheduled Meds:allopurinoL (ZYLOPRIM) tablet 100 mg, 100 mg, Oral, BID  aspirin chewable tablet 81 mg, 81 mg, Oral, QDAY  atorvastatin (LIPITOR) tablet 80 mg, 80 mg, Oral, QHS  cefTRIAXone (ROCEPHIN) IVP 2 g, 2 g, Intravenous, Q24H*  clotrimazole (LOTRIMIN) 1 % topical cream, , Topical, BID  gabapentin (NEURONTIN) capsule 100 mg, 100 mg, Oral, QHS  [Held by Provider] heparin (porcine) PF syringe 5,000 Units, 5,000 Units, Subcutaneous, Q8H  insulin aspart (U-100) (NOVOLOG FLEXPEN U-100 INSULIN) injection PEN 0-6 Units, 0-6 Units, Subcutaneous, ACHS (22)  ketoconazole (NIZORAL) 2 % topical cream, , Topical, BID  levothyroxine (SYNTHROID) tablet 75 mcg, 75 mcg, Oral, QDAY  [START ON 03/22/2023] metoprolol succinate XL (TOPROL XL) tablet 25 mg, 25 mg, Oral, QDAY  metoprolol tartrate tablet 12.5 mg, 12.5 mg, Oral, BID  oxymetazoline (AFRIN EXTRA MOISTURIZING) 0.05 % nasal spray 2 spray, 2 spray, Each Nostril, BID  pantoprazole (PROTONIX) injection 40 mg, 40 mg, Intravenous, BID(11-21)  polyethylene glycol 3350 (MIRALAX) packet 17 g, 1 packet, Oral, QDAY  pramipexole (MIRAPEX) tablet 1.5 mg, 1.5 mg, Oral, QHS  sennosides-docusate sodium (SENOKOT-S) tablet 1 tablet, 1 tablet, Oral, BID  sodium chloride (SEA MIST) 0.65 % nasal spray 2 spray, 2 spray, Each Nostril, QDAY(21)  ticagrelor (BRILINTA) tablet 90 mg, 90 mg, Oral, BID    Continuous Infusions:      PRN and Respiratory Meds:acetaminophen Q4H PRN, dextrose 50% (D50) IV PRN, ondansetron (ZOFRAN) IV Q6H PRN      Physical Examination                          Vital Signs: Most Recent                  Vital Signs: 24 Hour Range   BP: 125/68 (12/26 1532)  Temp: 36.6 ?C (97.8 ?F) (12/26 1532)  Pulse: 69 (12/26 1532)  Respirations: 18 PER MINUTE (12/26 1532)  SpO2: 100 % (12/26 1532)  O2%: 21 % (12/26 0101)  O2 Device: None (Room air) (12/26 1532) BP: (93-125)/(54-71)   Temp:  [36.3 ?C (97.4 ?F)-36.6 ?C (97.8 ?F)]   Pulse:  [69-76]   Respirations:  [13 PER MINUTE-18 PER MINUTE]   SpO2:  [95 %-100 %]   O2%:  [21 %]   O2 Device: None (Room air)     General appearance: awake, alert, nad  HENT: no oral lesions/thrush, epistaxis on L today  Lungs: clear anteriorly  Heart: Regular rhythm, normal rate, with no murmur  Abdomen: soft, non-tender, normoactive bowel sounds  Skin: no other rash    Lines: no erythema    Laboratory (reviewed and or/ ordered by me)    Hematology  Recent Labs     03/18/23  2205 03/19/23  0402 03/19/23  0402 03/20/23  0351 03/21/23  0412 03/21/23  1800   WBC  --  5.20  --  4.70 4.40*  --    HGB  --  8.5*   < > 8.3* 7.5* 8.2*   HCT  --  25.7*   < > 25.4* 22.1* 25.9*   PLTCT  --  168  --  148* 139*  --    PTT >200.0*  --   --   --   --   --     < > =  values in this interval not displayed.     Chemistry  Recent Labs     03/19/23  0402 03/20/23  0351 03/21/23  0412   NA 145 144 143   K 4.0 4.2 4.2   CL 116* 117* 117*   CO2 21 17* 18*   BUN 58* 58* 60*   CR 2.46* 2.57* 2.86*   GFR 26* 25* 22*   GLU 137* 86 92   CA 8.5 8.4* 8.2*   PO4 3.9 3.8  --    ALBUMIN 3.0* 2.8* 2.6*   ALKPHOS 75 72 71   AST 235* 130* 67*   ALT 51 47 38   TOTBILI 0.5 0.4 0.3     Microbiology, Radiology and other Diagnostics Review   Microbiology data reviewed and/or ordered by me.    Pertinent radiology images were visualized and reviewed and/or ordered by me, and the dates and associated independent interpretation is incorporated into the above HPI/assessment.       12/23 ECHO  Interpretation Summary  Show Result ComparisonModerate reduced LV systolic function EF estimated 30-35%.  Wall motion abnormality described below.  Wall motion is suggestive of possible LAD territory injury/ischemia +/- multivessel disease.  No LV thrombus identified on the current study.  The RV does appear to be moderately to severely dilated.  Mild RV systolic dysfunction.    More focal distal free wall hypokinesis appreciated.    RA/RV pacer wire identified.  Mild tricuspid valve regurgitation.  Known bicuspid aortic valve.  Fusion of the left and right coronary cusp.  Visually leaflet excursion is limited.  No evidence of significant aortic valve stenosis.  Peak velocity 1.4 m/s, mean gradient 4 mmHg.  DVI 0.43.  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  The aortic root measures 3.7 cm.  No pericardial effusion.  CVP >15 mmHg.  PA systolic pressure estimated at 31 mmHg.  However PA systolic pressure could be underestimated given RV dysfunction.     Elta Guadeloupe, DO   Division of Infectious Diseases   Please contact preferentially via Voalte

## 2023-03-21 NOTE — Progress Notes
 Cardiology Progress Note      Name: Paul Benjamin        Birthday: 31-Jan-1946                                MRN: [id number]  Admission Date: 03/18/2023                                                LOS: 3 days      Brief Hospital Course     Paul Benjamin is a 77 y.o. man with a past medical history of AD polycystic kidney disease, HFpEF, CKD Stage IV, T2DM, anemia of chronic disease, history of complicated UTI with urinary retention and bilateral pyelonephritis, urge incontinence, chronic indwelling foley catheter with plans to transition to suprapubic in February of 2025, recent history of lower extremity cellulitis, historically low blood pressure with transition from Coreg to metoprolol, SSS s/p dual chamber PPM (2008), afib s/p ablation (2009), HTN, CAD s/p stenting x2 to LAD and proximal portion of diagonal branch (2005), dyslipidemia, and OSA compliant with CPAP who presented to [facility] as a transfer from [facility] and was found to be in septic shock most likely from the GU tract with increasing troponin and reported chest pain. He was taken to cath lab on 12/23 with stenting x2 to the proximal RCA. He was then transferred to the CICU for monitoring s/p PCI on IV cefepime and Levophed.     Urine culture positive for pan sensitive Serratia marcescens. Antibiotics de-escalated to Rocephin on 12/25. GDMT limited due to advanced CKD. Initiation of metoprolol tartrate 12.5 mg PO BID 12/25 as the patient's BP stable off of pressor support.     Interval update 03/21/2023:  > Discontinued pressor 12/25. Remove central line. Downgrade to telemetry   > Tentative plan for repeat TTE in the outpatient setting to reassess LV function  > Started metoprolol tartrate 12.5 mg PO BID, will transition to succinate  > Will not start ACEi/ARB and spironolactone due to advanced CKD. Patient would not be a good candidate for SGLUT2 inhibitor due to chronic indwelling catheter with history of complicated UTIs  > Deescalated antibiotics to Rocephin t12/25 based on c/, plan for 7 days total  > Device interrogation showing fairly significant increase in RV pacing over the last 1-2 days most likely 2/2 to NSTEMI. 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, <= 40%) (HCC)    CKD stage 4 due to type 2 diabetes mellitus (HCC)    Venous stasis dermatitis of both lower extremities    Overactive bladder (OAB)    Hx of heart artery stent    Chronic indwelling Foley catheter    NSTEMI (non-ST elevated myocardial infarction) (HCC)    Complicated urinary tract infection    Lower extremity cellulitis    Ischemic cardiomyopathy  Anemia    NEURO/PSYCH  #Restless Leg Syndrome  - PTA Medications: Pramipexole 1.5mg  qHS and gabapentin 100mg  qHS  Plan:  > Continue PTA pramipexole 1.5mg  qHS and gabapentin 100mg  qHS        PULMONARY  #OSA, compliant with CPAP  - Admission ABG: 7.38/30/189  - Admission CXR: No consolidation, pleural effusion, or pneumothorax  Plan:  > Continue home CPAP         CARDIOVASCULAR  #NSTEMI s/p PCI with stenting x2 to proximal RCA (03/18/23)   #CAD s/p PCI to LAD and proximal portion of diagonal branch (2005)  #HFpEF  #SSS s/p dual chamber PPM (2008)  #Afib s/p ablation (2009)  #Mixed dyslipidemia  - Pressors: levophed titrated to goal MAP > 65   - Admission EKG: Sinus rhythm with first degree AV block and left axis deviation  - Post cath EKG: Sinus rhythm with first degree AV block and left axis deviation  - Admission Echo: moderately reduced LV systolic function with EF 30-35% (previously 56% 01/2023); new regional wall motion abnormalities (akinesis of inferoseptal and inferolateral walls and apical segments; hypokinesis of basal inferoseptal, basal inferior, mid anteroseptal, and mid inferior walls); moderately to severely dilated RV with mild RV systolic dysfunction; known bicuspid aortic valve  - Trop: 3 (OSH); 12,690 (admission); 14,792 (4 hours post admission)  - 12/23 PCI with stenting x 2 to proximal RCA  Lab Results   Component Value Date    CHOL 76 03/19/2023    TRIG 123 03/19/2023    HDL 26 (L) 03/19/2023    LDL 38 03/19/2023    VLDL 54.0 03/19/2023    NONHDLCHOL 50 03/19/2023    CHOLHDLC 4 06/05/2021     Guideline Directed Medical Therapy PTA Inpatient Changes   ACEI/ARB No  Renal dysfunction    ARNI No Renal dysfunction    BB No (Hypotension)    SGLT-2 Inhibitor No UTIs, current or frequent    Mineralocorticoid Receptor Antagonist No Renal dysfunction    Hydralazine/Nitrate No Hypotension    Ivabradine No; Hypotension    Heart Rhythm Management Therapy Yes (PPM)    Anticoagulation None with history of Afib/flutter    Cardiac Rehab Evaluation for LVEF < or equal to 40% Yes; Date ordered: 12/23    7-Day post hospital follow up scheduled within 24-48 hours of discharge No Discharge not expected within the next 24-48 hours       - PTA meds: Atorvastatin 40mg  qHS, furosemide 40mg  qAM, metoprolol succinate 12.5mg  daily, and sublingual nitro  - Device interrogation: 9.1% RV paced (programmed MVP-->RV pacing likely r/t intermittent second degree AVB). Cardiac compass shows fairly significant increase in RV pacing over the last 1-2days  - HgA1c: 5.8%   Plan:  > Continue increased dose of atorvastatin 80 mg qHS  > Continue to hold PTA furosemide   > Patient has been off Levophed since early afternoon 12/24. Discontinue pressor today. Remove central line. Downgrade to telemetry   > Tentative plan for repeat TTE in the outpatient setting to reassess LV function  > Start metoprolol tartrate 12.5 mg PO BID with plan to transition to Toprol 25 mg PO daily as tolerated   > Continue to hold ACEi/ARB and spironolactone due to low blood pressure requiring pressor support. Patient would not be a good candidate for SGLUT2 inhibitor due to chronic indwelling catheter with history of complicated UTIs  > Continue aspirin 81mg  daily and ticagrelor 90mg  BID  > Continue monitoring patient on telemetry. Replace lytes prn,  K > 4, Mg > 2      FEN/GI  #Concern for UGI bleed  #Acute on chronic anemia  -Nursing reported black stools for past few BMs overnight  -Gradually progressive anemia with Hb drop to 7.5 compared to 9.8 on admission  -Patient takes oral iron outpatient but hasn't been given any inpatient  -Hemodynamically stable  Plan:  >Start IV Pantoprazole load and 40 mg BID  >GI consulted for possible upper endoscopy  >Will check reticulocyte counts  >Hold DVT prophylaxis but continue Brilinta for now    #Hx constipation  #Hypovitaminosis D  - PTA Medications:Vitamin D3 50,000U weekly,  Miralax PRN, and Senokot PRN  Plan:  > Will hold PTA vitamin D3 for now (last dose 12/18)  > Continue PTA Miralax and Senokot-S scheduled         RENAL  #AD Polycystic Kidney Disease  #CKD Stage IV  - Etiology of CKD most likely combination of T2DM (well controlled), ADPKD, and HFpEF   - Recent admission with AKI due to urinary retention and bilateral pyelonephritis  - Baseline creatinine 2.5-3.0   - Indwelling foley replaced 03/18/23. Plans to get suprapubic catheter placed in February 2025  - Follows with Dr. Philomena Course in the outpatient setting   Plan:  > Replace electrolytes PRN   > Continue to monitor kidney function as the patient is high risk for contrast induced nephropathy within the next 24-48 hours     #Chronic indwelling foley catheter   #Overactive bladder/Urge Incontinence   #Hx urinary retention   - Patient has a chronic indwelling catheter for urge incontinence/OAB and urinary retention  - Follows with urology in the outpatient setting   Plan:   > Appreciate urology recs       ENDOCRINE  #DMT2 with long-term insulin use  #Hypothyroidism  - 03/19/23 A1c 5.8  - 03/19/23: TSH 1.94  - PTA Medications: Levothyroxine qAM, Ozempic 1mg  weekly (last dose on 12/20), and aspart LDCF   Plan:  > POC blood glucose monitoring ordered ACHS with LDCF  > Continue PTA levothyroxine qAM  > Continue holding PTA Ozempic        HEME/ONC  #Anemia of chronic disease  #Concern for UGI bleed  - Baseline hemoglobin 9.5-10.5  03/19/2023: Iron 27 ?g/dL (L)  38/75/6433: Iron Binding-TIBC 167 mcg/dL (L)  29/51/8841: % Saturation 16 % (L)  03/19/2023: Ferritin 554 ng/mL (H)   - Admission hemoglobin 9.8  Plan:  > Monitor HGB with daily AM CBC  > Transfuse if Hgb <7 or <8 with active bleeding  > DVT Ppx: Heparin subq  > Consider IV iron in outpatient setting   >GI evaluation for UGI bleeding as above        ID  #Urosepsis   #History of complicated UTIs   #Concern for right shin cellulitis  #Intertrigo complicated by candidal infection  - Recent history of UTI treated with Augmentin transitioned to Keflex in early December, bilateral pyelonephritis treated with Zosyn transitioned to Augmentin early November, and doxycycline 100 mg BID for cellulitis 11/5 - 11/14  - Admission UA showing packed WBCs, 20-50 RBCs, moderate bacteria but was nitrite negative. 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   - Admission CXR without signs of consolidation  - 03/18/23 Blood Cx: NGTD  - 03/18/2023 Urine Cx: Serratia marcescens   - Antibiotics:               Meropenem (12/23 single dose at outside hospital)  Cefepime (12/23 - 12/25)    Rocephin (12/25 - )   Plan:  > Continue clotrimazole topical cream BID for candidal intertrigo  > Wound team following  > Deescalate antibiotics to Rocephin today based off of sensitivities   > ID following, appreciate recs   >Plan to continue IV abx for 5 days total and switch to oral Cefuroxime for the last two days MSK/DERM  #Gout  #Venous stasis changes bilateral lower extremities   Plan:  > Continue PTA allopurinol 100mg  BID     LDA/PROPHYLAXIS  Lines: PIV x 1,  Tubes: Right radial sheath  Urinary Catheter:  Yes; Retain foley due to:  Urinary retention  Antibiotic Usage:  Yes; Infection present or suspected:  Wound/Skin/Tissue;  Cellulitis and Candidal intertrigo  GU/GI;  Urinary tract infection (UTI)  VTE ppx: SCDs, subq heparin  GI:  Not indicated  Bowel regimen: miralax and senna  Diet: Cardiac diet   Code status: FULL CODE     Patient seen and discussed with attending physician, Dr. Beather Arbour.    Lateasha Breuer, MBBS  Internal Medicine, PGY-2  Available on Voalte     Subjective     No acute overnight events. Patient denies recurrence of chest pain or shortness of breath. No lower extremity edema. Reports having black stools for a while, thinks may be related to oral iron but explained that he is off of it while in the hospital. Nursing showed well-formed black stool that was cleaned this morning from his trash in the room. Denies dizziness, light-headedness.      ROS:   Review of Systems   Constitutional:  Negative for chills and fever.   Respiratory:  Negative for cough and shortness of breath.    Cardiovascular:  Negative for chest pain and palpitations.   Gastrointestinal:  Positive for constipation and melena. Negative for abdominal pain and diarrhea.   Genitourinary:  Negative for dysuria, hematuria and urgency.         Objective     Scheduled Meds:allopurinoL (ZYLOPRIM) tablet 100 mg, 100 mg, Oral, BID  aspirin chewable tablet 81 mg, 81 mg, Oral, QDAY  atorvastatin (LIPITOR) tablet 80 mg, 80 mg, Oral, QHS  cefTRIAXone (ROCEPHIN) IVP 2 g, 2 g, Intravenous, Q24H*  clotrimazole (LOTRIMIN) 1 % topical cream, , Topical, BID  gabapentin (NEURONTIN) capsule 100 mg, 100 mg, Oral, QHS  [Held by Provider] heparin (porcine) PF syringe 5,000 Units, 5,000 Units, Subcutaneous, Q8H  insulin aspart (U-100) (NOVOLOG FLEXPEN U-100 INSULIN) injection PEN 0-6 Units, 0-6 Units, Subcutaneous, ACHS (22)  ketoconazole (NIZORAL) 2 % topical cream, , Topical, BID  levothyroxine (SYNTHROID) tablet 75 mcg, 75 mcg, Oral, QDAY  metoprolol tartrate tablet 12.5 mg, 12.5 mg, Oral, BID  pantoprazole (PROTONIX) injection 40 mg, 40 mg, Intravenous, BID(11-21)  polyethylene glycol 3350 (MIRALAX) packet 17 g, 1 packet, Oral, QDAY  pramipexole (MIRAPEX) tablet 1.5 mg, 1.5 mg, Oral, QHS  sennosides-docusate sodium (SENOKOT-S) tablet 1 tablet, 1 tablet, Oral, BID  ticagrelor (BRILINTA) tablet 90 mg, 90 mg, Oral, BID    Continuous Infusions:      PRN and Respiratory Meds:acetaminophen Q4H PRN, dextrose 50% (D50) IV PRN, ondansetron (ZOFRAN) IV Q6H PRN                           Vital Signs: Last Filed                 Vital Signs: 24 Hour Range   BP: 110/60 (12/26  1224)  Temp: 36.4 ?C (97.6 ?F) (12/26 1224)  Pulse: 70 (12/26 1224)  Respirations: 16 PER MINUTE (12/26 1224)  SpO2: 99 % (12/26 1224)  O2%: 21 % (12/26 0101)  O2 Device: None (Room air) (12/26 1224) BP: (93-123)/(54-71)   Temp:  [36.3 ?C (97.4 ?F)-36.5 ?C (97.7 ?F)]   Pulse:  [69-76]   Respirations:  [13 PER MINUTE-18 PER MINUTE]   SpO2:  [95 %-99 %]   O2%:  [21 %]   O2 Device: None (Room air)   Intensity Pain Scale (Self Report): 5 (03/21/23 0109) Vitals:    03/19/23 0400 03/20/23 0440 03/21/23 0431   Weight: 96.4 kg (212 lb 8.4 oz) 96.5 kg (212 lb 11.9 oz) 96.4 kg (212 lb 8.4 oz)         Physical Exam  Constitutional:       General: He is not in acute distress.  HENT:      Head: Normocephalic and atraumatic.      Nose: Nose normal.      Mouth/Throat:      Mouth: Mucous membranes are moist.      Pharynx: Oropharynx is clear.   Eyes:      General: No scleral icterus.     Extraocular Movements: Extraocular movements intact.      Conjunctiva/sclera: Conjunctivae normal.   Cardiovascular:      Rate and Rhythm: Normal rate and regular rhythm.      Heart sounds: Normal heart sounds. No murmur heard.  Pulmonary: Effort: Pulmonary effort is normal.      Breath sounds: Normal breath sounds. No stridor. No wheezing.   Abdominal:      General: Abdomen is flat. Bowel sounds are normal.      Palpations: Abdomen is soft.      Tenderness: There is no abdominal tenderness.   Skin:     Findings: Erythema (right shin + warmth) present.      Comments: Chronic venous stasis changes in bilateral LE    Neurological:      General: No focal deficit present.      Mental Status: He is alert. Mental status is at baseline.   Psychiatric:         Mood and Affect: Mood normal.         Thought Content: Thought content normal.          Artificial Airway   None       Ventilator/Respiratory Therapy  No     Vent Weaning   Not applicable    Laboratory:  Recent Labs     03/18/23  1415 03/19/23  0402 03/20/23  0351 03/21/23  0412   NA 145 145 144 143   K 3.9 4.0 4.2 4.2   CL 115* 116* 117* 117*   CO2 20* 21 17* 18*   GAP 10 8 10 8    BUN 62* 58* 58* 60*   CR 2.71* 2.46* 2.57* 2.86*   GLU 119* 137* 86 92   CA 8.8 8.5 8.4* 8.2*   ALBUMIN 3.2* 3.0* 2.8* 2.6*   MG 2.3 2.0 2.0 2.1   PO4 3.5 3.9 3.8  --    HGBA1C  --  5.8*  --   --    TSH  --  1.94  --   --        Recent Labs     03/18/23  1415 03/18/23  1416 03/18/23  1605 03/18/23  2205 03/19/23  0402 03/20/23  0351 03/21/23  4540  WBC  --  5.60  --   --  5.20 4.70 4.40*   HGB  --  9.8*  --   --  8.5* 8.3* 7.5*   HCT  --  30.2*  --   --  25.7* 25.4* 22.1*   PLTCT  --  181  --   --  168 148* 139*   PT  --   --  14.1*  --   --   --   --    INR  --   --  1.3*  --   --   --   --    PTT  --   --  54.6* >200.0*  --   --   --    AST 75*  --   --   --  235* 130* 67*   ALT 31  --   --   --  51 47 38   ALKPHOS 87  --   --   --  75 72 71      Estimated Creatinine Clearance: 24.4 mL/min (A) (based on SCr of 2.86 mg/dL (H)).  Vitals:    03/19/23 0400 03/20/23 0440 03/21/23 0431   Weight: 96.4 kg (212 lb 8.4 oz) 96.5 kg (212 lb 11.9 oz) 96.4 kg (212 lb 8.4 oz)      Recent Labs     03/18/23  1423   PHART 7.38   PO2ART 189*         Pertinent radiology reviewed.    Malnutrition Details:                                        Active Wounds

## 2023-03-21 NOTE — Progress Notes
 OCCUPATIONAL THERAPY  NOTE     Name: Paul Benjamin   MRN: [id number]     DOB: 06-Nov-1945      Age: 77 y.o.  Admission Date: 03/18/2023     LOS: 3 days     Date of Service: 03/21/2023    Patient was unable to participate in therapy at this time as he was managing a bloody nose. Occupational therapy will continue to follow and provide intervention as indicated.    Therapist: Azucena Kuba, OTR/L  Date: 03/21/2023

## 2023-03-21 NOTE — Consults
 Otolaryngology Consult Note    Today's Date:  03/21/2023  Admission Date: 03/18/2023  LOS: 3 days    Reason for Consult:  Epistaxis       Assessment & Plan:  Paul Benjamin is a 77 y.o. male PMH AD polycystic kidney disease, HFpEF, CKD Stage IV, T2DM, anemia of chronic disease, history of complicated UTI with urinary retention and bilateral pyelonephritis, urge incontinence, chronic indwelling foley catheter with plans to transition to suprapubic in February of 2025, recent history of lower extremity cellulitis who was admitted for / presented to the ED with elevated troponins and now s/p CICU for PCI intervention x2 stents to proximal RCA. ENT consulted for 24 hour history of unilateral epistaxis (L) despite Afrin:    - Anterior rhinoscopy and rigid endoscopy (0 degree) reveals diffuse active bleeding of left anterior caudal nasal septum (Little's area). Mucosal irritation also seen on head of right inferior turbinate. No posterior epistaxis visualized. Contributing factors include DAPT initation, underlying anemia and coagulopathy, nasal cannula, nightly CPAP use   - Hemostasis achieved with Nasopore and surgicel (2 cm x 1 cm) absorbable packing. No need for additional antibiotics. Absorbable packing with dissolve with saline spray   - Recommend nasal saline spray q6hrs and PRN and vaseline to nares BID to prevent drying and rebleeding. If patient requires oxygen, recommend humidified oxygen via facemask.   - If patient rebleeds: Spray Afrin (oxymetazoline) liberally in both nares, sit patient upright with slight flexion of neck (to decrease aspiration risk) and apply constat external nasal compression on soft part of nose for 15 minutes (set a timer, do not release pressure to check if bleeding has stopped until 15 minutes is complete). If Epistaxis fails to remit please contact ENT team  - Recommend maintaining nasal packing for 3-5 days  - Remainder of care per primary. ENT will sign off at this time. Please re-engage if epistaxis worsens     Will discuss with staff, Dr. Cameron Proud.  Please page with any questions or concerns.    Vallarie Mare, MD   Otolaryngology Resident, PGY2    [phone] (Team Pager)  [phone] (Personal Pager)  Available on Voalte    ______________________________________________________________________    History of Present Illness: Paul Benjamin is a 77 y.o. male PMH of CAD, septic shock/urosepsis, NSTEMI, CKD stage IV, DMII, Anemia of chronic disease and LE cellulitis. ENT consulted for 24 hour history of intermittent epistaxis from the left nare. Daughter and wife at bedside who help provide independent history.     He is admitted for monitoring a s/p PCI with stenting x 2 to proximal RCA  Initiation of DAPT (aspirin 81mg  daily and ticagrelor 90mg  BID) post-operatively. No baseline oxygen needs but has used on 12/23. Uses CPAP at night. Daughter states nosebleeds are common during the winter time when he is more dry. Afrin used but firm pressure at the base of the nose not attempted. He has had Burr holes in the past but no other history of sinonasal surgery or head and neck radiation. Ongoing workup for possible UGI bleeding underway.     Past Medical History:    Atrial fibrillation (HCC)    CAD (coronary artery disease)    CHF (congestive heart failure) (HCC)    Coronary artery disease (CAD)    HX: anticoagulation    Hypercholesterolemia    Hypertension    Hypertriglyceridemia    Kidney disease    Obesity    Sleep apnea    SSS (sick sinus  syndrome) (HCC)    Type II diabetes mellitus (HCC)       Surgical History:   Procedure Laterality Date    PACEMAKER PLACEMENT  09/30/2006    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    Performed by Kathreen Cornfield, MD at [facility]    Removal Permanent Pacemaker N/A 01/17/2016    Performed by Kathreen Cornfield, MD at [facility]    BURR HOLE      CARDIAC CATHERIZATION      CORONARY STENT PLACEMENT      HX APPENDECTOMY         Social History     Socioeconomic History    Marital status: Married   Occupational History     Employer: [employer]     Comment: retired   Tobacco Use    Smoking status: Former     Current packs/day: 0.00     Average packs/day: 1 pack/day for 4.0 years (4.0 ttl pk-yrs)     Types: Cigarettes     Start date: 03/26/1962     Quit date: 03/26/1966     Years since quitting: 57.0    Smokeless tobacco: Never   Vaping Use    Vaping status: Never Used   Substance and Sexual Activity    Alcohol use: No     Comment: rarely    Drug use: Never    Sexual activity: Never       Family History: reviewed and is non-contributory    Allergies:  Ciprofloxacin, Lisinopril, Cefuroxime, Clindamycin, Plavix [clopidogrel], Sulfa (sulfonamide antibiotics), and Quinine    Scheduled Meds:allopurinoL (ZYLOPRIM) tablet 100 mg, 100 mg, Oral, BID  aspirin chewable tablet 81 mg, 81 mg, Oral, QDAY  atorvastatin (LIPITOR) tablet 80 mg, 80 mg, Oral, QHS  cefTRIAXone (ROCEPHIN) IVP 2 g, 2 g, Intravenous, Q24H*  clotrimazole (LOTRIMIN) 1 % topical cream, , Topical, BID  gabapentin (NEURONTIN) capsule 100 mg, 100 mg, Oral, QHS  [Held by Provider] heparin (porcine) PF syringe 5,000 Units, 5,000 Units, Subcutaneous, Q8H  insulin aspart (U-100) (NOVOLOG FLEXPEN U-100 INSULIN) injection PEN 0-6 Units, 0-6 Units, Subcutaneous, ACHS (22)  ketoconazole (NIZORAL) 2 % topical cream, , Topical, BID  levothyroxine (SYNTHROID) tablet 75 mcg, 75 mcg, Oral, QDAY  [START ON 03/22/2023] metoprolol succinate XL (TOPROL XL) tablet 25 mg, 25 mg, Oral, QDAY  metoprolol tartrate tablet 12.5 mg, 12.5 mg, Oral, BID  oxymetazoline (AFRIN EXTRA MOISTURIZING) 0.05 % nasal spray 2 spray, 2 spray, Each Nostril, BID  pantoprazole (PROTONIX) injection 40 mg, 40 mg, Intravenous, BID(11-21)  polyethylene glycol 3350 (MIRALAX) packet 17 g, 1 packet, Oral, QDAY  pramipexole (MIRAPEX) tablet 1.5 mg, 1.5 mg, Oral, QHS  sennosides-docusate sodium (SENOKOT-S) tablet 1 tablet, 1 tablet, Oral, BID  sodium chloride (SEA MIST) 0.65 % nasal spray 2 spray, 2 spray, Each Nostril, QDAY(21)  ticagrelor (BRILINTA) tablet 90 mg, 90 mg, Oral, BID    Continuous Infusions:  PRN and Respiratory Meds:acetaminophen Q4H PRN, dextrose 50% (D50) IV PRN, ondansetron (ZOFRAN) IV Q6H PRN      Review of Systems:  10 point ROS conducted and negative except as noted in HPI    Vital Signs:  Last Filed in 24 hours Vital Signs:  24 hour Range    BP: 125/68 (12/26 1532)  Temp: 36.6 ?C (97.8 ?F) (12/26 1532)  Pulse: 69 (12/26 1532)  Respirations: 18 PER MINUTE (12/26 1532)  SpO2: 100 % (12/26 1532)  O2%: 21 % (12/26 0101)  O2 Device: None (  Room air) (12/26 1532) BP: (93-125)/(54-71)   Temp:  [36.3 ?C (97.4 ?F)-36.6 ?C (97.8 ?F)]   Pulse:  [69-76]   Respirations:  [13 PER MINUTE-18 PER MINUTE]   SpO2:  [95 %-100 %]   O2%:  [21 %]   O2 Device: None (Room air)     Physical Exam:  General: Well-nourished  Communication and Voice: Clear pitch, normal tone   Hearing: Hearing adequate for verbal communication bilaterally   Inspection: Normocephalic and atraumatic without mass or lesion   Palpation: Facial skeleton intact without bony stepoffs   Facial Strength: Facial motility symmetric and full bilaterally   Pinna: External ear intact and fully developed   External Canal: Canal is patent with intact skin   External Nose: No scar or anatomic deformity,   Internal Nose: Septum intact. Left nare with active epistaxis (slow oozing)   Oropharynx: Symmetric palate elevation, oropharynx without dried heme   Larynx: Normal voice, no stridor or stertor.   Neck, Trachea, Lymphatics: Midline trachea without mass or lesion  Eyes: No nystagmus with equal extraocular motion bilaterally   Neuro/Psych/Balance: Patient oriented and appropriate in interaction; Appropriate mood and affect; Cranial nerves II-XII are grossly intact   Respiratory effort: Equal inspiration and expiration, no respiratory distress, room air   Peripheral Vascular: Warm extremities, chronic venous stasis changes     Procedure: Nasal Packing  Findings:  active bleeding of left anterior caudal nasal septum (Little's area). Mucosal irritation (no epistaxis) also seen on head of right inferior turbinate.  After verbal consent was obtained the patient was positioned upright. The nares were evaluated with anterior rhinoscopy and 0 degree endoscope with the above findings. The nasal cavities were suctioned and all blood clot was removed. Surgicel and nasopore (1 cm x 2 cm) were placed over the raw edges of the septum. Hemostasis was achieved. All instrumentation was removed. The patient tolerated the procedure well. Afrin was sprayed after packing placement.      Laboratory Results:   24-hour labs:    Results for orders placed or performed during the hospital encounter of 03/18/23 (from the past 24 hours)   POC GLUCOSE    Collection Time: 03/20/23  4:29 PM   Result Value Ref Range    Glucose, POC 172 (H) 70 - 100 mg/dL   POC GLUCOSE    Collection Time: 03/20/23  9:10 PM   Result Value Ref Range    Glucose, POC 153 (H) 70 - 100 mg/dL   CBC AND DIFF    Collection Time: 03/21/23  4:12 AM   Result Value Ref Range    White Blood Cells 4.40 (L) 4.50 - 11.00 10*3/uL    Red Blood Cells 2.61 (L) 4.40 - 5.50 10*6/uL    Hemoglobin 7.5 (L) 13.5 - 16.5 g/dL    Hematocrit 16.1 (L) 40.0 - 50.0 %    MCV 84.5 80.0 - 100.0 fL    MCH 28.8 26.0 - 34.0 pg    MCHC 34.1 32.0 - 36.0 g/dL    RDW 09.6 (H) 04.5 - 15.0 %    Platelet Count 139 (L) 150 - 400 10*3/uL    MPV 7.0 7.0 - 11.0 fL    Neutrophils 78 (H) 41 - 77 %    Lymphocytes 12 (L) 24 - 44 %    Monocytes 7 4 - 12 %    Eosinophils 2 0 - 5 %    Basophils 1 0 - 2 %    Absolute Neutrophil Count 3.40 1.80 -  7.00 10*3/uL    Absolute Lymph Count 0.50 (L) 1.00 - 4.80 10*3/uL    Absolute Monocyte Count 0.30 0.00 - 0.80 10*3/uL    Absolute Eosinophil Count 0.10 0.00 - 0.45 10*3/uL    Absolute Basophil Count 0.00 0.00 - 0.20 10*3/uL    MDW (Monocyte Distribution Width) 25.0 (H) <=20.6   MAGNESIUM  CELLULAR THERAPEUTICS    Collection Time: 03/21/23  4:12 AM   Result Value Ref Range    Magnesium 2.1 1.6 - 2.6 mg/dL   COMPREHENSIVE METABOLIC PANEL    Collection Time: 03/21/23  4:12 AM   Result Value Ref Range    Sodium 143 137 - 147 mmol/L    Potassium 4.2 3.5 - 5.1 mmol/L    Chloride 117 (H) 98 - 110 mmol/L    Glucose 92 70 - 100 mg/dL    Blood Urea Nitrogen 60 (H) 7 - 25 mg/dL    Creatinine 7.82 (H) 0.40 - 1.24 mg/dL    Calcium 8.2 (L) 8.5 - 10.6 mg/dL    Total Protein 5.1 (L) 6.0 - 8.0 g/dL    Total Bilirubin 0.3 0.2 - 1.3 mg/dL    Albumin 2.6 (L) 3.5 - 5.0 g/dL    Alk Phosphatase 71 25 - 110 U/L    AST 67 (H) 7 - 40 U/L    ALT 38 7 - 56 U/L    CO2 18 (L) 21 - 30 mmol/L    Anion Gap 8 3 - 12    Glomerular Filtration Rate (GFR) 22 (L) >60 mL/min   RETICULOCYTE COUNT    Collection Time: 03/21/23  4:12 AM   Result Value Ref Range    Retic, Uncorrected 2.1 (H) 0.5 - 2.0 %    Retic, Corrected 1.0 %    Retic, Absolute 54.0 30.0 - 94.0 10*3/uL   POC GLUCOSE    Collection Time: 03/21/23  6:41 AM   Result Value Ref Range    Glucose, POC 101 (H) 70 - 100 mg/dL   POC GLUCOSE    Collection Time: 03/21/23 10:54 AM   Result Value Ref Range    Glucose, POC 109 (H) 70 - 100 mg/dL       Problem List:  Patient Active Problem List    Diagnosis Date Noted    Anemia 03/21/2023    NSTEMI (non-ST elevated myocardial infarction) (HCC) 03/19/2023    Acute heart failure with reduced ejection fraction (HFrEF, <= 40%) (HCC) 03/19/2023    Complicated urinary tract infection 03/19/2023    Lower extremity cellulitis 03/19/2023    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    Septic shock (HCC) 03/18/2023    History of gout 03/18/2023    Hypothyroid 03/18/2023    Hx of heart artery stent 03/18/2023    Chronic indwelling Foley catheter 03/18/2023    Urinary retention, chronic     Hydronephrosis, bilateral     CKD (chronic kidney disease) 02/01/2023    Anemia of renal disease 12/21/2022    Overactive bladder (OAB)     Urinary frequency     Urinary urgency     Nocturia     Urinary incontinence, urge (UUI)     Venous stasis dermatitis of both lower extremities 05/23/2020    Obesity, morbid (HCC) 05/23/2020    Hypercholesterolemia 05/23/2020    Controlled type 2 diabetes mellitus with complication, with long-term current use of insulin (HCC) 04/28/2020    CKD stage 4 due to type 2 diabetes mellitus (HCC) 12/09/2019    Lower extremity edema  12/03/2019    Hypovitaminosis D 12/03/2019    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, <= 40%) (HCC) 09/22/2018    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

## 2023-03-22 ENCOUNTER — Encounter: Admit: 2023-03-22 | Discharge: 2023-03-22 | Payer: MEDICARE

## 2023-03-22 VITALS — BP 109/56 | HR 70 | Temp 97.50000°F

## 2023-03-22 VITALS — BP 124/65 | HR 73 | Temp 98.30000°F | Wt 207.2 lb

## 2023-03-22 VITALS — HR 72

## 2023-03-22 VITALS — HR 74

## 2023-03-22 VITALS — BP 106/71 | HR 85 | Temp 97.10000°F

## 2023-03-22 VITALS — BP 121/60 | HR 73 | Temp 97.60000°F

## 2023-03-22 VITALS — BP 132/63 | HR 79 | Temp 97.60000°F

## 2023-03-22 VITALS — BP 120/64 | Temp 97.40000°F

## 2023-03-22 LAB — CBC
~~LOC~~ BKR HEMATOCRIT: 25 % — ABNORMAL LOW (ref 40.0–50.0)
~~LOC~~ BKR HEMOGLOBIN: 8.5 g/dL — ABNORMAL LOW (ref 13.5–16.5)
~~LOC~~ BKR MCH: 29 pg — ABNORMAL LOW (ref 26.0–34.0)
~~LOC~~ BKR MCHC: 33 g/dL (ref 32.0–36.0)
~~LOC~~ BKR MCV: 87 fL — ABNORMAL HIGH (ref 80.0–100.0)
~~LOC~~ BKR MPV: 6.7 fL — ABNORMAL LOW (ref 7.0–11.0)
~~LOC~~ BKR PLATELET COUNT: 156 10*3/uL (ref 150–400)
~~LOC~~ BKR RBC COUNT: 2.9 10*6/uL — ABNORMAL LOW (ref 4.40–5.50)
~~LOC~~ BKR RDW: 18 % — ABNORMAL HIGH (ref 11.0–15.0)

## 2023-03-22 LAB — COMPREHENSIVE METABOLIC PANEL
~~LOC~~ BKR ALBUMIN: 2.9 g/dL — ABNORMAL LOW (ref 3.5–5.0)
~~LOC~~ BKR ALK PHOSPHATASE: 83 U/L — ABNORMAL HIGH (ref 25–110)
~~LOC~~ BKR AST: 45 U/L — ABNORMAL HIGH (ref 7–40)
~~LOC~~ BKR POTASSIUM: 4.6 mmol/L — ABNORMAL LOW (ref 3.5–5.1)

## 2023-03-22 LAB — ECG 12-LEAD
P AXIS: 53 degrees
P-R INTERVAL: 264 ms
Q-T INTERVAL: 462 ms
QRS DURATION: 92 ms
QTC CALCULATION (BAZETT): 508 ms
R AXIS: -37 degrees
T AXIS: -25 degrees
VENTRICULAR RATE: 73 {beats}/min

## 2023-03-22 LAB — MANUAL DIFF
~~LOC~~ BKR EOSINOPHILS - RELATIVE: 2 % (ref 0–5)
~~LOC~~ BKR LYMPHOCYTES - RELATIVE: 19 % — ABNORMAL LOW (ref 24–44)
~~LOC~~ BKR METAMYELOCYTES - RELATIVE: 2 %
~~LOC~~ BKR MONOCYTES - RELATIVE: 4 % — ABNORMAL HIGH (ref 4–12)
~~LOC~~ BKR MYELOCYTES - RELATIVE: 1 %
~~LOC~~ BKR NEUT+BANDS - ABSOLUTE: 3.3 10*3/uL (ref 1.8–7.0)
~~LOC~~ BKR NEUTROPHILS - RELATIVE: 72 % (ref 41–77)
~~LOC~~ BKR NUCLEATED RBC MANUAL: 1 10*3/uL

## 2023-03-22 LAB — CBC AND DIFF
~~LOC~~ BKR HEMATOCRIT: 24 % — ABNORMAL LOW (ref 40.0–50.0)
~~LOC~~ BKR MCH: 28 pg — ABNORMAL HIGH (ref 26.0–34.0)
~~LOC~~ BKR MCHC: 33 g/dL — ABNORMAL HIGH (ref 32.0–36.0)
~~LOC~~ BKR MCV: 86 fL — ABNORMAL HIGH (ref 80.0–100.0)
~~LOC~~ BKR MPV: 6.5 fL — ABNORMAL LOW (ref 7.0–11.0)
~~LOC~~ BKR PLATELET COUNT: 161 10*3/uL — ABNORMAL LOW (ref 150–400)
~~LOC~~ BKR RBC COUNT: 2.8 10*6/uL — ABNORMAL LOW (ref 4.40–5.50)
~~LOC~~ BKR RDW: 19 % — ABNORMAL HIGH (ref 11.0–15.0)
~~LOC~~ BKR WBC COUNT: 4.6 10*3/uL — ABNORMAL LOW (ref 4.50–11.00)

## 2023-03-22 LAB — HEMOGLOBIN & HEMATOCRIT
~~LOC~~ BKR HEMATOCRIT: 25 % — ABNORMAL LOW (ref 40.0–50.0)
~~LOC~~ BKR HEMOGLOBIN: 8.2 g/dL — ABNORMAL LOW (ref 13.5–16.5)

## 2023-03-22 LAB — POC GLUCOSE
~~LOC~~ BKR POC GLUCOSE: 165 mg/dL — ABNORMAL HIGH (ref 70–100)
~~LOC~~ BKR POC GLUCOSE: 170 mg/dL — ABNORMAL HIGH (ref 70–100)
~~LOC~~ BKR POC GLUCOSE: 122 mg/dL — ABNORMAL HIGH (ref 70–100)
~~LOC~~ BKR POC GLUCOSE: 142 mg/dL — ABNORMAL HIGH (ref 70–100)
~~LOC~~ BKR POC GLUCOSE: 161 mg/dL — ABNORMAL HIGH (ref 70–100)

## 2023-03-22 LAB — LIPOPROTEIN A: ~~LOC~~ BKR LIPOPROTEIN A: 11 mg/dL (ref <=29)

## 2023-03-22 LAB — MAGNESIUM  CELLULAR THERAPEUTICS: ~~LOC~~ BKR MAGNESIUM: 2 mg/dL — ABNORMAL LOW (ref 1.6–2.6)

## 2023-03-22 MED ORDER — SODIUM CHLORIDE 0.65 % NA SPRA
2 | Freq: Four times a day (QID) | NASAL | 0 refills | Status: DC
Start: 2023-03-22 — End: 2023-03-26
  Administered 2023-03-22: 16:00:00 2 via NASAL

## 2023-03-22 NOTE — Progress Notes
 [facility] END OF SHIFT/PLAN OF CARE NURSING NOTE   Nursing Shift: Night Shift 1900-0700    Acute events, nursing interventions, & communication with providers: Pt continues to have black stools. No other acute events.       Patient Goal(s)  Patient will Report improve GI symptoms by the end of next shift.        Patient will  Verbalize readiness for discharge by discharge.   Admission Weight: Weight: 95.3 kg (210 lb)    Last 3 Weights:   Vitals:    03/20/23 0440 03/21/23 0431 03/22/23 0334   Weight: 96.5 kg (212 lb 11.9 oz) 96.4 kg (212 lb 8.4 oz) 94 kg (207 lb 3.7 oz)     Weight Change: Weight trend decreasing    Intake/Output Summary (Last 24 hours) at 03/22/2023 0611  Last data filed at 03/22/2023 0334  Gross per 24 hour   Intake 240 ml   Output 1935 ml   Net -1695 ml     Last Bowel Movement Date: 03/21/23    Fluid Restriction? No   Quality/Safety    Total Fall Risk Score: 15   Risk for Injury related to falls: Coagulopathies/risk for bleed and Standard risk for injury based on the ABCS scoring tool  Fall Risk Category:   History of More Than One Fall Within 6 Months Before Admission: No  Elimination, Bowel and Urine: Incontinence OR urgency OR frequency  Interventions: Use of bladder management device (e.g., male/male external urinary containment device)   Medications: On 2 or more high fall risk drugs  Interventions: Bedside commode (i.e., urgency, frequency, dizziness) , Stay within arm's reach during toileting/showering (i.e., dizziness, orthostasis) , and Educate patient on medication side effects  Patient Care Equipment: Two present  Interventions: Needs assistance with patient care equipment when ambulating and Ensure environment is free of clutter and walkways are clear from tripping hazards  Mobility: 2 - Assistance required, 2 - Unsteady gait  Interventions: Assist x2, Gait belt in use when ambulating, Use of additional staff for handling patient equipment, Utilize walker, cane, or additional walking aid for ambulation, and Elimination equipment at bedside (urinal or commode)   Cognition: 0 - No cognition issues  Interventions: N/A - Does not score as risk in this category    Other safety precautions in place: HAPI Prevention Bundle and CAUTI Prevention Bundle    Restraints:  No      Patient Education  This RN provided education to Patient today. The following education topics were reviewed:  Quality/Safety Education:   CAUTI prevention and Fall risk  Medication Education:   Medication management (Indication, adverse effects, monitoring, etc)  Education provided on the following medication(s): lipitor  Cardiac - Specific Education:   Heart failure management and Cardiac diet/nutrition  General Education:   Bowel/Urinary elimination, Diet/nutrition, Glucose management, and Mobility/Activity intolerance    The following teaching method(s) were used: Verbal  Response to learning: Verbalizes Understanding  Needs reinforcement on: n/a

## 2023-03-22 NOTE — Progress Notes
 Infectious Diseases Progress    Today's Date:  03/22/2023  Admission Date: 03/18/2023    Reason for this consultation: Septic shock, likely urinary source, with history of complicated UTI     Assessment:     Shock, septic +/-cardiogenic  Complicated UTI  12/5 Serratia marcescens bacteriuria, treated with augmentin->cephalexin  12/22 UC at [facility] is still pending per verbal from lab  12/22 NGTD BCs  12/23 pyuria at [facility], Serratia marcescens - pan-S    NSTEMI  HFrEF  H/o CAD s/p PCI x 2  - low BP reading of 70s/50s at home, chest tightness  - Presented to outside ER morning of 12/23, found to have elevated troponin of 3.933 (0 - .033), EKG with AV block, prolonged QTC  - Trop:  12,690 --> 14,792 post transfer  - POCUS notable for RV, IVC dilation; 12/23 ECHO with EF now 30-35%  - Given clinical presentation, ongoing chest pain, hypotension requiring pressors and evidence of regional myocardial injury on echocardiogram, he was emergently taken to the Cath Lab. Coronary angiography revealed multivessel disease with culprit lesion thought to be a thrombotic occlusion of a large, dominant RCA. 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 A Urethral catheter placed    A fib s/p ablation - not on AC  SSS s/p PPM (09/2006)    H/o COVID-19 pneumonia with acute hypoxic respiratory failure 2020     DM type 2  CKD  HTN  HLD  Obesity      Chronic LE edema w/ venous stasis with prior cellulitis    Abx allergies/intolerance  -TMP/SMX: break out in hives per patient  -Cefuorixime - Reported hives  -Clindamycin - Reported rash  - Cipro - Reports rash    Recommendations:     Cont.ceftriaxone - will need 7 days treatment of UTI - (12/24- 12/30) favor staying on ceftriaxone while inpt/at least the first 5 days then if he is ready for dc prior to end date (and if [facility] remain neg) can do cefpodoxime for remainder. Quinolone or sulfa would have better urinary levels, but allergies preclude   Follow-up BC  Will FU [facility] cultures (NTD, not finalized)  Monitor for now daily cbc/diff, CMP given antimicrobial drug therapy which requires intensive monitoring for toxicity  Ketoconazole cream for toes bid x 2 wks  A&D ointment for leg abrasions prn  GI to do EGD 12/30      Dr. Brandon Benjamin will round again on the patient on Monday.  Please feel free to call the ID fellow on call at pager [phone] if there are any new issues or concerns over the weekend.      History of Present Illness    Paul Benjamin is a 77 y.o. patient who I am seeing today for septic shock with potential urinary source, an acute infection that may pose a threat to life or bodily function and necessitates antimicrobial drug therapy that requires intensive monitoring for toxicity.     Paul Benjamin has a PMHx as listed below. Patient's history or present illness was obtained via review and summarization of old medical records and via discussion with patient and family at bedside.     Afebrile, VSS  No n/v/d; Nose bleed improved  No other new sx today  WBC 4.6  Creat 2.9  Primary team note reviewed  Antimicrobial Start date End date   Cefepime  12/24 12/25   rocephin 12/25                             Estimated Creatinine Clearance: 23.5 mL/min (A) (based on SCr of 2.93 mg/dL (H)).      Medications   Scheduled Meds:allopurinoL (ZYLOPRIM) tablet 100 mg, 100 mg, Oral, BID  aspirin chewable tablet 81 mg, 81 mg, Oral, QDAY  atorvastatin (LIPITOR) tablet 80 mg, 80 mg, Oral, QHS  cefTRIAXone (ROCEPHIN) IVP 2 g, 2 g, Intravenous, Q24H*  clotrimazole (LOTRIMIN) 1 % topical cream, , Topical, BID  gabapentin (NEURONTIN) capsule 100 mg, 100 mg, Oral, QHS  [Held by Provider] heparin (porcine) PF syringe 5,000 Units, 5,000 Units, Subcutaneous, Q8H  insulin aspart (U-100) (NOVOLOG FLEXPEN U-100 INSULIN) injection PEN 0-6 Units, 0-6 Units, Subcutaneous, ACHS (22)  ketoconazole (NIZORAL) 2 % topical cream, , Topical, BID  levothyroxine (SYNTHROID) tablet 75 mcg, 75 mcg, Oral, QDAY  metoprolol succinate XL (TOPROL XL) tablet 25 mg, 25 mg, Oral, QDAY  pantoprazole (PROTONIX) injection 40 mg, 40 mg, Intravenous, BID(11-21)  polyethylene glycol 3350 (MIRALAX) packet 17 g, 1 packet, Oral, QDAY  pramipexole (MIRAPEX) tablet 1.5 mg, 1.5 mg, Oral, QHS  sennosides-docusate sodium (SENOKOT-S) tablet 1 tablet, 1 tablet, Oral, BID  sodium chloride (SEA MIST) 0.65 % nasal spray 2 spray, 2 spray, Each Nostril, QID  ticagrelor (BRILINTA) tablet 90 mg, 90 mg, Oral, BID    Continuous Infusions:      PRN and Respiratory Meds:acetaminophen Q4H PRN, dextrose 50% (D50) IV PRN, ondansetron (ZOFRAN) IV Q6H PRN      Physical Examination                          Vital Signs: Most Recent                  Vital Signs: 24 Hour Range   BP: 120/64 (12/27 1521)  Temp: 36.3 ?C (97.4 ?F) (12/27 1521)  Pulse: 70 (12/27 1103)  Respirations: 17 PER MINUTE (12/27 1521)  SpO2: 100 % (12/27 1521)  O2%: 21 % (12/26 2103)  O2 Device: None (Room air) (12/27 1521) BP: (109-124)/(56-65)   Temp:  [36.3 ?C (97.4 ?F)-36.8 ?C (98.3 ?F)]   Pulse:  [70-85]   Respirations:  [16 PER MINUTE-20 PER MINUTE]   SpO2:  [97 %-100 %]   O2%:  [21 %]   O2 Device: None (Room air)     General appearance: awake, alert, nad, doing a word search  HENT: no oral lesions/thrush, crusting L nare today  Lungs: clear anteriorly  Heart: Regular rhythm, normal rate, with no murmur  Abdomen: soft, non-tender, normoactive bowel sounds  Skin: no other rashes noted    Lines: no erythema    Laboratory (reviewed and or/ ordered by me)    Hematology  Recent Labs     03/21/23  0412 03/21/23  1800 03/22/23  0342 03/22/23  1532   WBC 4.40*  --  4.60 4.60   HGB 7.5* 8.2* 8.2* 8.5*   HCT 22.1* 25.9* 24.3* 25.6*   PLTCT 139*  --  161 156     Chemistry  Recent Labs     03/20/23  0351 03/21/23  0412 03/22/23  0342   NA 144 143 145   K 4.2 4.2 4.6   CL 117* 117* 118*   CO2 17* 18* 18*  BUN 58* 60* 57*   CR 2.57* 2.86* 2.93*   GFR 25* 22* 21*   GLU 86 92 104*   CA 8.4* 8.2* 8.2*   PO4 3.8  --   --    ALBUMIN 2.8* 2.6* 2.9*   ALKPHOS 72 71 83   AST 130* 67* 45*   ALT 47 38 35   TOTBILI 0.4 0.3 0.3     Microbiology, Radiology and other Diagnostics Review   Microbiology data reviewed and/or ordered by me.    Pertinent radiology images were visualized and reviewed and/or ordered by me, and the dates and associated independent interpretation is incorporated into the above HPI/assessment.       12/23 ECHO  Interpretation Summary  Show Result ComparisonModerate reduced LV systolic function EF estimated 30-35%.  Wall motion abnormality described below.  Wall motion is suggestive of possible LAD territory injury/ischemia +/- multivessel disease.  No LV thrombus identified on the current study.  The RV does appear to be moderately to severely dilated.  Mild RV systolic dysfunction.    More focal distal free wall hypokinesis appreciated.    RA/RV pacer wire identified.  Mild tricuspid valve regurgitation.  Known bicuspid aortic valve.  Fusion of the left and right coronary cusp.  Visually leaflet excursion is limited.  No evidence of significant aortic valve stenosis.  Peak velocity 1.4 m/s, mean gradient 4 mmHg.  DVI 0.43.  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  The aortic root measures 3.7 cm.  No pericardial effusion.  CVP >15 mmHg.  PA systolic pressure estimated at 31 mmHg.  However PA systolic pressure could be underestimated given RV dysfunction.     Elta Guadeloupe, DO   Division of Infectious Diseases   Please contact preferentially via Voalte

## 2023-03-22 NOTE — Progress Notes
 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    Epistaxis, now resolved    Continue dual antiplatelet therapy (DAPT) given MI and fresh stent  EGD planned for Mon  GDMT remains very limited due to his severe CKD, but if his blood pressure remains in its present normal/low-normal state tomorrow AM, will add very low dose hydralazine/isosorbide dinitrate.    At present, no signs of decompensated heart failure     Long discussion with pt and family about his complex, multiple issues.  All questions answered.

## 2023-03-22 NOTE — Progress Notes
 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 man with a past medical history of AD polycystic kidney disease, HFpEF, CKD Stage IV, T2DM, anemia of chronic disease, history of complicated UTI with urinary retention and bilateral pyelonephritis, urge incontinence, chronic indwelling foley catheter with plans to transition to suprapubic in February of 2025, recent history of lower extremity cellulitis, historically low blood pressure with transition from Coreg to metoprolol, SSS s/p dual chamber PPM (2008), afib s/p ablation (2009), HTN, CAD s/p stenting x2 to LAD and proximal portion of diagonal branch (2005), dyslipidemia, and OSA compliant with CPAP who presented to [place] as a transfer from [facility] and was found to be in septic shock most likely from the GU tract with increasing troponin and reported chest pain. He was taken to cath lab on 12/23 with stenting x2 to the proximal RCA. He was then transferred to the CICU for monitoring s/p PCI on IV cefepime and Levophed.      Urine culture positive for pan sensitive Serratia marcescens. Antibiotics de-escalated to Rocephin on 12/25. GDMT limited due to advanced CKD. Initiation of BB on 12/25 as the patient's BP stable off of pressor support. Hospital course complicated by melena and epistaxis, Hg remaining stable. Tentative plan for EGD on 12/30.     Interval Updates:   > Plan for EGD on 12/30   > Continue PPI and monitoring Hg q12hr   > Plan to complete 7 day course of antibiotics. BC remain negative     #Urosepsis   #History of complicated UTIs   #Intertrigo complicated by candidal infection  - Recent history of UTI treated with Augmentin transitioned to Keflex in early December, bilateral pyelonephritis treated with Zosyn transitioned to Augmentin early November, and doxycycline 100 mg BID for cellulitis 11/5 - 11/14  - Admission UA showing packed WBCs, 20-50 RBCs, moderate bacteria but was nitrite negative. 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   - Admission CXR without signs of consolidation  - 03/18/23 Blood Cx: NGTD  - 03/18/2023 Urine Cx: Serratia marcescens   - Antibiotics:               Meropenem (12/23 single dose at outside hospital)               Cefepime (12/23 - 12/25)                Rocephin (12/25 - )   Plan:  > Continue clotrimazole topical cream BID for candidal intertrigo  > Wound team following  >  Continue Rocephin (12/25 - ) with plan to completed a seven day course   > ID following, appreciate recs     #NSTEMI s/p PCI with stenting x2 to proximal RCA (03/18/23)   #CAD s/p PCI to LAD and proximal portion of diagonal branch (2005)  #HFpEF  #SSS s/p dual chamber PPM (2008)  #Afib s/p ablation (2009)  #Mixed dyslipidemia  - Pressors: levophed titrated to goal MAP > 65   - Admission EKG: Sinus rhythm with first degree AV block and left axis deviation  - Post cath EKG: Sinus rhythm with first degree AV block and left axis deviation  - Admission Echo: moderately reduced LV systolic function with EF 30-35% (previously 56% 01/2023); new regional wall motion abnormalities (akinesis of inferoseptal and inferolateral walls and apical segments; hypokinesis of basal inferoseptal, basal inferior, mid anteroseptal, and mid inferior walls); moderately to severely dilated RV with mild RV systolic dysfunction; known bicuspid aortic valve  - Trop: 3 (OSH); 12,690 (admission); 14,792 (4 hours post admission)  - 12/23 PCI with stenting x 2 to proximal RCA        Lab Results   Component Value Date     CHOL 76 03/19/2023     TRIG 123 03/19/2023     HDL 26 (L) 03/19/2023     LDL 38 03/19/2023     VLDL 24.6 03/19/2023     NONHDLCHOL 50 03/19/2023     CHOLHDLC 4 06/05/2021      Guideline Directed Medical Therapy PTA Inpatient Changes   ACEI/ARB No  Renal dysfunction     ARNI No Renal dysfunction     BB On Toprol 25 mg daily  Continue PTA Toprol   SGLT-2 Inhibitor No UTIs, current or frequent     Mineralocorticoid Receptor Antagonist No Renal dysfunction     Hydralazine/Nitrate No Hypotension     Ivabradine No; Hypotension     Heart Rhythm Management Therapy Yes (PPM)     Anticoagulation None with history of Afib/flutter  DAPT s/p PCI    Cardiac Rehab Evaluation for LVEF < or equal to 40% Yes; Date ordered: 12/23     7-Day post hospital follow up scheduled within 24-48 hours of discharge No Discharge not expected within the next 24-48 hours        - PTA meds: Atorvastatin 40mg  qHS, furosemide 40mg  qAM, metoprolol succinate 12.5mg  daily, and sublingual nitro  - Device interrogation: 9.1% RV paced (programmed MVP-->RV pacing likely r/t intermittent second degree AVB). Cardiac compass shows fairly significant increase in RV pacing over the last 1-2days  - HgA1c: 5.8%   Plan:  > Continue increased dose of atorvastatin 80 mg qHS  > Continue to hold PTA furosemide   > Tentative plan for repeat TTE in the outpatient setting to reassess LV function  > Continue PTA metoprolol succinate   > Will not start ACEi/ARB and spironolactone due to advanced CKD. Patient would not be a good candidate for SGLUT2 inhibitor due to chronic indwelling catheter with history of complicated UTIs  > Continue aspirin 81mg  daily and ticagrelor 90mg  BID  > Continue monitoring patient on telemetry. Replace lytes prn, K > 4, Mg > 2     #Suspected melena   #Acute on chronic normocytic normochromic anemia   #Hx anemia of chronic disease  - Baseline hemoglobin 9.5-10.5  03/19/2023: Iron 27 ?g/dL (L)  18/84/1660: Iron Binding-TIBC 167 mcg/dL (L)  63/03/6008: % Saturation 16 % (L)  03/19/2023: Ferritin 554 ng/mL (H)   -  Admission hemoglobin 9.8  Plan:  > Trend Hg q12hr   > Transfuse if Hgb <7 or <8 with active bleeding  > DVT Ppx: holding   > Plan for EGD on 12/30   > Continue PPI (12/26 - )   > GI following, appreciate assistance   > Consider IV iron in outpatient setting     #Epistaxis, resolved  > Consulted ENT, appreciate recs   > Continue saline nasal spray q6hr, vaseline BID, and humidified air w/ CPAP    #AD Polycystic Kidney Disease  #CKD Stage IV  - Etiology of CKD most likely combination of T2DM (well controlled), ADPKD, and HFpEF   - Recent admission with AKI due to urinary retention and bilateral pyelonephritis  - Baseline creatinine 2.5-3.0   - Indwelling foley replaced 03/18/23. Plans to get suprapubic catheter placed in February 2025  - Follows with Dr. Philomena Course in the outpatient setting   Plan:  > Replace electrolytes PRN   > Continue to monitor kidney function      #Chronic indwelling foley catheter   #Overactive bladder/Urge Incontinence   #Hx urinary retention   - Patient has a chronic indwelling catheter for urge incontinence/OAB and urinary retention  - Follows with urology in the outpatient setting   Plan:   > Appreciate urology recs     #Gout  #Venous stasis changes bilateral lower extremities   Plan:  > Continue PTA allopurinol 100mg  BID    #DMT2 with long-term insulin use  #Hypothyroidism  - 03/19/23 A1c 5.8  - 03/19/23: TSH 1.94  - PTA Medications: Levothyroxine qAM, Ozempic 1mg  weekly (last dose on 12/20), and aspart LDCF   Plan:  > POC blood glucose monitoring ordered ACHS with LDCF  > Continue PTA levothyroxine qAM  > Continue holding PTA Ozempic    #OSA, compliant with CPAP  - Admission ABG: 7.38/30/189  - Admission CXR: No consolidation, pleural effusion, or pneumothorax  Plan:  > Continue home CPAP     #Restless Leg Syndrome  - PTA Medications: Pramipexole 1.5mg  qHS and gabapentin 100mg  qHS  Plan:  > Continue PTA pramipexole 1.5mg  qHS and gabapentin 100mg  qHS    Nutrition: No Dietitian Consult  Wound: Wound Documentation Wound:      Wounds Moisture associated skin damage Anterior;Right;Left;Medial Pelvis (Active)   03/18/23 1500   Wound Type: Moisture associated skin damage   Orientation: Anterior;Right;Left;Medial   Location: Pelvis   Wound Location Comments:    Initial Wound Site Closure:    Initial Dressing Placed:    Initial Cycle:    Initial Suction Setting (mmHg):    Pressure Injury Stages:    Pressure Injury Present Within 24 Hours of Hospital Admission:    If This Pressure Injury Is Suspected to Be Device Related, Please Select the Device::    Is the Wound Open or Closed:    Wound Assessment Moist;Pink 03/22/23 0334   Peri-wound Assessment Pink;Red 03/22/23 0334   Wound Drainage Amount None 03/22/23 0334   Wound Dressing Status None/open to air 03/22/23 0334   Wound Care Treatment or ointment applied 03/21/23 2024   Wound Dressing and/or Treatment Pharmaceutical treatment (see MAR) 03/22/23 0334   Number of days: 4       Wounds Pressure injury Sacrum (Active)   03/18/23 1500   Wound Type: Pressure injury   Orientation:    Location: Sacrum   Wound Location Comments:    Initial Wound Site Closure:    Initial Dressing Placed:  Initial Cycle:    Initial Suction Setting (mmHg):    Pressure Injury Stages: Stage 1   Pressure Injury Present Within 24 Hours of Hospital Admission:    If This Pressure Injury Is Suspected to Be Device Related, Please Select the Device::    Is the Wound Open or Closed:    Wound Assessment Dressing not removed for assessment 03/22/23 0334   Peri-wound Assessment Intact 03/22/23 0334   Wound Drainage Amount None 03/22/23 0334   Wound Dressing Status Intact 03/22/23 0334   Wound Care Treatment or ointment applied 03/19/23 0000   Wound Dressing and/or Treatment Foam 03/22/23 0334   Number of days: 4       Wounds Puncture Anterior;Right;Proximal Arm (Active)   03/18/23 2045   Wound Type: Puncture   Orientation: Anterior;Right;Proximal   Location: Arm   Wound Location Comments: Forearm   Initial Wound Site Closure:    Initial Dressing Placed:    Initial Cycle:    Initial Suction Setting (mmHg):    Pressure Injury Stages:    Pressure Injury Present Within 24 Hours of Hospital Admission:    If This Pressure Injury Is Suspected to Be Device Related, Please Select the Device::    Is the Wound Open or Closed:    Wound Assessment Red;Purple 03/22/23 0334   Peri-wound Assessment Erythema;Bruised 03/22/23 0334   Wound Drainage Amount None 03/22/23 0334   Wound Dressing Status None/open to air 03/22/23 0334   Wound Care Treatment or ointment applied 03/19/23 0000   Wound Dressing and/or Treatment Other (Comment) 03/19/23 0000   Number of days: 4                   Checklist:  FEN: no fluids, replace lytes prn, cardiac diet    LDA: PIV x 1 and chronic indwelling foley catheter   VTE PPx: holding   Code status: Full Code   Dispo: Admit to cardiology     Chancy Milroy, DO  Internal Medicine, PGY-1  Available on Voalte    Patient seen and discussed with Dr. Beather Arbour     ________________________________________________________________________    Interval History: No acute events overnight. Patient reports bilateral heel pain this morning.     Review of Systems: 10 point comprehensive ROS is negative, except for:  as stated above     Medications:  Scheduled Meds:allopurinoL (ZYLOPRIM) tablet 100 mg, 100 mg, Oral, BID  aspirin chewable tablet 81 mg, 81 mg, Oral, QDAY  atorvastatin (LIPITOR) tablet 80 mg, 80 mg, Oral, QHS  cefTRIAXone (ROCEPHIN) IVP 2 g, 2 g, Intravenous, Q24H*  clotrimazole (LOTRIMIN) 1 % topical cream, , Topical, BID  gabapentin (NEURONTIN) capsule 100 mg, 100 mg, Oral, QHS  [Held by Provider] heparin (porcine) PF syringe 5,000 Units, 5,000 Units, Subcutaneous, Q8H  insulin aspart (U-100) (NOVOLOG FLEXPEN U-100 INSULIN) injection PEN 0-6 Units, 0-6 Units, Subcutaneous, ACHS (22)  ketoconazole (NIZORAL) 2 % topical cream, , Topical, BID  levothyroxine (SYNTHROID) tablet 75 mcg, 75 mcg, Oral, QDAY  metoprolol succinate XL (TOPROL XL) tablet 25 mg, 25 mg, Oral, QDAY  pantoprazole (PROTONIX) injection 40 mg, 40 mg, Intravenous, BID(11-21)  polyethylene glycol 3350 (MIRALAX) packet 17 g, 1 packet, Oral, QDAY  pramipexole (MIRAPEX) tablet 1.5 mg, 1.5 mg, Oral, QHS  sennosides-docusate sodium (SENOKOT-S) tablet 1 tablet, 1 tablet, Oral, BID  sodium chloride (SEA MIST) 0.65 % nasal spray 2 spray, 2 spray, Each Nostril, QID  ticagrelor (BRILINTA) tablet 90 mg, 90 mg, Oral, BID    Continuous  Infusions:  PRN and Respiratory Meds:acetaminophen Q4H PRN, dextrose 50% (D50) IV PRN, ondansetron (ZOFRAN) IV Q6H PRN      Objective:    Physical Exam:  Vital Signs: Last Filed Vital Signs: 24 Hour Range   BP: 124/65 (12/27 0334)  Temp: 36.8 ?C (98.3 ?F) (12/27 1191)  Pulse: 73 (12/27 0334)  Respirations: 20 PER MINUTE (12/27 0334)  SpO2: 100 % (12/27 0334)  O2%: 21 % (12/26 2103)  O2 Device: CPAP/BiPAP (12/27 0334) BP: (110-125)/(60-68)   Temp:  [36.3 ?C (97.4 ?F)-36.8 ?C (98.3 ?F)]   Pulse:  [69-85]   Respirations:  [15 PER MINUTE-20 PER MINUTE]   SpO2:  [98 %-100 %]   O2%:  [21 %]   O2 Device: CPAP/BiPAP     Physical Exam  Constitutional:       Appearance: He is not toxic-appearing.   HENT:      Head: Normocephalic and atraumatic.      Nose: Nose normal.      Comments: Nasal packing in left nasal cavity      Mouth/Throat:      Mouth: Mucous membranes are moist.   Eyes:      General: No scleral icterus.     Extraocular Movements: Extraocular movements intact.      Conjunctiva/sclera: Conjunctivae normal.   Cardiovascular:      Rate and Rhythm: Normal rate and regular rhythm.      Heart sounds: Normal heart sounds.   Pulmonary:      Effort: Pulmonary effort is normal.      Breath sounds: Normal breath sounds. No stridor. No wheezing.   Abdominal:      General: Abdomen is flat. Bowel sounds are normal.      Palpations: Abdomen is soft.      Tenderness: There is no abdominal tenderness.   Skin:     General: Skin is warm and dry.   Neurological:      General: No focal deficit present.      Mental Status: He is alert.   Psychiatric:         Mood and Affect: Mood normal.         Thought Content: Thought content normal.         Judgment: Judgment normal.          Intake/Output Summary (last 24 hr):    Intake/Output Summary (Last 24 hours) at 03/22/2023 0705  Last data filed at 03/22/2023 0334  Gross per 24 hour   Intake 240 ml   Output 1935 ml   Net -1695 ml         Last bowel movement: 03/21/23      Lab / Radiology / Other Diagnostic Tests:  Pertinent labs and radiology reviewed.

## 2023-03-23 VITALS — BP 95/55 | HR 71 | Temp 98.60000°F

## 2023-03-23 VITALS — HR 72

## 2023-03-23 VITALS — BP 93/46 | HR 72

## 2023-03-23 VITALS — BP 98/59 | HR 73 | Temp 98.20000°F

## 2023-03-23 VITALS — BP 113/64 | Temp 97.10000°F

## 2023-03-23 VITALS — BP 112/57 | HR 72 | Temp 97.60000°F

## 2023-03-23 VITALS — BP 104/52 | HR 72 | Temp 97.90000°F

## 2023-03-23 VITALS — Wt 213.2 lb

## 2023-03-23 VITALS — BP 94/50 | HR 73 | Temp 97.80000°F

## 2023-03-23 LAB — DEVICE EVALUATION - PPM (PERMANENT PACEMAKER)
EP ATRIAL LEAD MODEL#: 407
EP RV LEAD MODEL#: 407

## 2023-03-23 LAB — CBC AND DIFF
~~LOC~~ BKR HEMATOCRIT: 23 % — ABNORMAL LOW (ref 40.0–50.0)
~~LOC~~ BKR HEMOGLOBIN: 7.6 g/dL — ABNORMAL LOW (ref 13.5–16.5)
~~LOC~~ BKR RBC COUNT: 2.7 10*6/uL — ABNORMAL LOW (ref 4.40–5.50)
~~LOC~~ BKR WBC COUNT: 5.6 10*3/uL — ABNORMAL LOW (ref 4.50–11.00)

## 2023-03-23 LAB — CULTURE-BLOOD W/SENSITIVITY

## 2023-03-23 LAB — CBC
~~LOC~~ BKR HEMATOCRIT: 22 % — ABNORMAL LOW (ref 40.0–50.0)
~~LOC~~ BKR HEMOGLOBIN: 7.4 g/dL — ABNORMAL LOW (ref 13.5–16.5)
~~LOC~~ BKR MCH: 29 pg — ABNORMAL HIGH (ref 26.0–34.0)
~~LOC~~ BKR MCHC: 33 g/dL — ABNORMAL HIGH (ref 32.0–36.0)
~~LOC~~ BKR MCV: 86 fL — ABNORMAL LOW (ref 80.0–100.0)
~~LOC~~ BKR MPV: 6.9 fL — ABNORMAL LOW (ref 7.0–11.0)
~~LOC~~ BKR PLATELET COUNT: 167 10*3/uL — ABNORMAL HIGH (ref 150–400)
~~LOC~~ BKR RBC COUNT: 2.5 10*6/uL — ABNORMAL LOW (ref 4.40–5.50)
~~LOC~~ BKR RDW: 18 % — ABNORMAL HIGH (ref 11.0–15.0)
~~LOC~~ BKR WBC COUNT: 6.4 10*3/uL — ABNORMAL HIGH (ref 4.50–11.00)

## 2023-03-23 LAB — COMPREHENSIVE METABOLIC PANEL
~~LOC~~ BKR BLD UREA NITROGEN: 54 mg/dL — ABNORMAL HIGH (ref 7–25)
~~LOC~~ BKR CALCIUM: 8.7 mg/dL — ABNORMAL HIGH (ref 8.5–10.6)
~~LOC~~ BKR CHLORIDE: 117 mmol/L — ABNORMAL HIGH (ref 98–110)
~~LOC~~ BKR CREATININE: 2.4 mg/dL — ABNORMAL HIGH (ref 0.40–1.24)
~~LOC~~ BKR GLUCOSE, RANDOM: 101 mg/dL — ABNORMAL HIGH (ref 70–100)
~~LOC~~ BKR TOTAL PROTEIN: 5.6 g/dL — ABNORMAL LOW (ref 6.0–8.0)

## 2023-03-23 LAB — POC GLUCOSE
~~LOC~~ BKR POC GLUCOSE: 187 mg/dL — ABNORMAL HIGH (ref 70–100)
~~LOC~~ BKR POC GLUCOSE: 108 mg/dL — ABNORMAL HIGH (ref 70–100)
~~LOC~~ BKR POC GLUCOSE: 131 mg/dL — ABNORMAL HIGH (ref 70–100)
~~LOC~~ BKR POC GLUCOSE: 141 mg/dL — ABNORMAL HIGH (ref 70–100)

## 2023-03-23 LAB — MAGNESIUM  CELLULAR THERAPEUTICS: ~~LOC~~ BKR MAGNESIUM: 1.9 mg/dL — ABNORMAL HIGH (ref 1.6–2.6)

## 2023-03-23 MED ORDER — HYDRALAZINE 10 MG PO TAB
10 mg | Freq: Three times a day (TID) | ORAL | 0 refills | Status: DC
Start: 2023-03-23 — End: 2023-03-24
  Administered 2023-03-23 – 2023-03-24 (×2): 10 mg via ORAL

## 2023-03-23 MED ORDER — ALLOPURINOL 100 MG PO TAB
100 mg | Freq: Every day | ORAL | 0 refills | Status: DC
Start: 2023-03-23 — End: 2023-03-26
  Administered 2023-03-24 – 2023-03-26 (×3): 100 mg via ORAL

## 2023-03-23 MED ORDER — MAGNESIUM OXIDE 400 MG (241.3 MG MAGNESIUM) PO TAB
400 mg | Freq: Two times a day (BID) | ORAL | 0 refills | Status: CP
Start: 2023-03-23 — End: ?
  Administered 2023-03-23 – 2023-03-24 (×2): 400 mg via ORAL

## 2023-03-23 MED ORDER — PRAMIPEXOLE 0.25 MG PO TAB
.75 mg | Freq: Every evening | ORAL | 0 refills | Status: DC
Start: 2023-03-23 — End: 2023-03-26
  Administered 2023-03-24 – 2023-03-26 (×3): 0.75 mg via ORAL

## 2023-03-23 MED ORDER — ISOSORBIDE DINITRATE 10 MG PO TAB
5 mg | Freq: Three times a day (TID) | ORAL | 0 refills | Status: DC
Start: 2023-03-23 — End: 2023-03-24
  Administered 2023-03-23 – 2023-03-24 (×2): 5 mg via ORAL

## 2023-03-23 NOTE — Progress Notes
 [facility] END OF SHIFT/PLAN OF CARE NURSING NOTE   Nursing Shift: Day Shift 0700-1900    Acute events, nursing interventions, & communication with providers: No acute events      Patient Goal(s)  Patient will Maintain stable fluid volume with clear breath sounds and vital signs within normal limit by the end of next shift.        Patient will  Report improve GI symptoms by discharge.   Admission Weight: Weight: 95.3 kg (210 lb)    Last 3 Weights:   Vitals:    03/21/23 0431 03/22/23 0334 03/23/23 0640   Weight: 96.4 kg (212 lb 8.4 oz) 94 kg (207 lb 3.7 oz) 96.7 kg (213 lb 3 oz)     Weight Change: Weight trend stable    Intake/Output Summary (Last 24 hours) at 03/23/2023 1554  Last data filed at 03/23/2023 1100  Gross per 24 hour   Intake 222 ml   Output 875 ml   Net -653 ml     Last Bowel Movement Date: 03/22/23    Fluid Restriction? No   Quality/Safety    Total Fall Risk Score: 11   Risk for Injury related to falls: Coagulopathies/risk for bleed  Fall Risk Category:   History of More Than One Fall Within 6 Months Before Admission: No  Elimination, Bowel and Urine: Incontinence OR urgency OR frequency  Interventions: Bed pan available in room and Use of bladder management device (e.g., male/male external urinary containment device)   Medications: On 2 or more high fall risk drugs  Interventions: Use of gait belt , Stay within arm's reach during toileting/showering (i.e., dizziness, orthostasis) , Educate patient on medication side effects, and Bed/chair alarm (i.e., change in mental status)   Patient Care Equipment: N/A  Interventions: Needs assistance with patient care equipment when ambulating, Ensure environment is free of clutter and walkways are clear from tripping hazards, and Assess need for patient equipment and remove if not in use  Mobility: 2 - Assistance required  Interventions: Assist x2, Gait belt in use when ambulating, Use of additional staff for handling patient equipment, Utilize walker, cane, or additional walking aid for ambulation, Ensure the use of corrective lens and/or hearing aides are in place prior to ambulation , and Utilize lift equipment for mobility (ex. hoyer, sara steady, sit-to-stand)  Cognition: 0 - No cognition issues  Interventions: Bed/Chair Alarm , Stay within arm's reach while patient ambulating/toileting/showering, and Increase frequency of purposeful rounding    Other safety precautions in place: HAPI Prevention Bundle and Delirium Protocol    Restraints:  No      Patient Education  This RN provided education to Patient, Family, and Significant Other today. The following education topics were reviewed:  Quality/Safety Education:   Fall risk, Pain scale, and Pressure injury prevention/care  Medication Education:   Cardiac medication(s) and Medication management (Indication, adverse effects, monitoring, etc)  Education provided on the following medication(s): Given MAR meds  Cardiac - Specific Education:   Heart failure management  General Education:   Diet/nutrition, Glucose management, and Tests/Procedures: EGD Monday    The following teaching method(s) were used: Verbal  Response to learning: [employer] Understanding  Needs reinforcement on: N/A

## 2023-03-23 NOTE — Progress Notes
 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 man with a past medical history of AD polycystic kidney disease, HFpEF, CKD Stage IV, T2DM, anemia of chronic disease, history of complicated UTI with urinary retention and bilateral pyelonephritis, urge incontinence, chronic indwelling foley catheter with plans to transition to suprapubic in February of 2025, recent history of lower extremity cellulitis, historically low blood pressure with transition from Coreg to metoprolol, SSS s/p dual chamber PPM (2008), afib s/p ablation (2009), HTN, CAD s/p stenting x2 to LAD and proximal portion of diagonal branch (2005), dyslipidemia, and OSA compliant with CPAP who presented to [place] as a transfer from [facility] and was found to be in septic shock most likely from the GU tract with increasing troponin and reported chest pain. He was taken to cath lab on 12/23 with stenting x2 to the proximal RCA. He was then transferred to the CICU for monitoring s/p PCI on IV cefepime and Levophed.      Urine culture positive for pan sensitive Serratia marcescens. Antibiotics de-escalated to Rocephin on 12/25. GDMT limited due to advanced CKD. Initiation of BB on 12/25 as the patient's BP stable off of pressor support. Hospital course complicated by melena and epistaxis, Hg remaining stable. Tentative plan for EGD on 12/30.     Interval Updates:   > Plan for EGD on 12/30   > Continue PPI and monitoring Hg q12hr   > Plan to complete 7 day course of antibiotics. BC remain negative   > Start isosorbide mononitrate and hydralazine today (with hold parameters)   > Continue to hold PTA Lasix     #Urosepsis   #History of complicated UTIs   #Intertrigo complicated by candidal infection  - Recent history of UTI treated with Augmentin transitioned to Keflex in early December, bilateral pyelonephritis treated with Zosyn transitioned to Augmentin early November, and doxycycline 100 mg BID for cellulitis 11/5 - 11/14  - Admission UA showing packed WBCs, 20-50 RBCs, moderate bacteria but was nitrite negative. 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   - Admission CXR without signs of consolidation  - 03/18/23 Blood Cx: NGTD  - 03/18/2023 Urine Cx: Serratia marcescens   - Antibiotics:               Meropenem (12/23 single dose at outside hospital)               Cefepime (12/23 - 12/25)  Rocephin (12/25 - )   Plan:  > Continue clotrimazole topical cream BID for candidal intertrigo  > Wound team following  > Continue Rocephin (12/25 - ) with plan to completed a seven day course  > ID following, appreciate recs     #NSTEMI s/p PCI with stenting x2 to proximal RCA (03/18/23)   #CAD s/p PCI to LAD and proximal portion of diagonal branch (2005)  #HFpEF  #SSS s/p dual chamber PPM (2008)  #Afib s/p ablation (2009)  #Mixed dyslipidemia  - Pressors: levophed titrated to goal MAP > 65   - Admission EKG: Sinus rhythm with first degree AV block and left axis deviation  - Post cath EKG: Sinus rhythm with first degree AV block and left axis deviation  - Admission Echo: moderately reduced LV systolic function with EF 30-35% (previously 56% 01/2023); new regional wall motion abnormalities (akinesis of inferoseptal and inferolateral walls and apical segments; hypokinesis of basal inferoseptal, basal inferior, mid anteroseptal, and mid inferior walls); moderately to severely dilated RV with mild RV systolic dysfunction; known bicuspid aortic valve  - Trop: 3 (OSH); 12,690 (admission); 14,792 (4 hours post admission)  - 12/23 PCI with stenting x 2 to proximal RCA        Lab Results   Component Value Date     CHOL 76 03/19/2023     TRIG 123 03/19/2023     HDL 26 (L) 03/19/2023     LDL 38 03/19/2023     VLDL 24.6 03/19/2023     NONHDLCHOL 50 03/19/2023     CHOLHDLC 4 06/05/2021      Guideline Directed Medical Therapy PTA Inpatient Changes   ACEI/ARB No  Renal dysfunction     ARNI No Renal dysfunction     BB On Toprol 25 mg daily  Continue PTA Toprol   SGLT-2 Inhibitor No UTIs, current or frequent     Mineralocorticoid Receptor Antagonist No Renal dysfunction     Hydralazine/Nitrate No Hypotension  Initiated 12/28   Ivabradine No; Hypotension     Heart Rhythm Management Therapy Yes (PPM)     Anticoagulation None with history of Afib/flutter  DAPT s/p PCI    Cardiac Rehab Evaluation for LVEF < or equal to 40% Yes; Date ordered: 12/23     7-Day post hospital follow up scheduled within 24-48 hours of discharge No Discharge not expected within the next 24-48 hours        - PTA meds: Atorvastatin 40mg  qHS, furosemide 40mg  qAM, metoprolol succinate 12.5mg  daily, and sublingual nitro  - Device interrogation: 9.1% RV paced (programmed MVP-->RV pacing likely r/t intermittent second degree AVB). Cardiac compass shows fairly significant increase in RV pacing over the last 1-2days  - HgA1c: 5.8%   Plan:  > Continue increased dose of atorvastatin 80 mg qHS  > Continue to hold PTA furosemide   > Tentative plan for repeat TTE in the outpatient setting to reassess LV function  > Continue PTA metoprolol succinate   > Will not start ACEi/ARB and spironolactone due to advanced CKD. Patient would not be a good candidate for SGLUT2 inhibitor due to chronic indwelling catheter with history of complicated UTIs  > Start hydralazine and isosorbide mononitrate today (with hold parameters)   > Continue aspirin 81mg  daily and ticagrelor 90mg  BID  > Continue monitoring patient on telemetry. Replace lytes prn, K > 4, Mg > 2     #Suspected melena   #Acute on chronic normocytic normochromic anemia   #Hx anemia of  chronic disease  - Baseline hemoglobin 9.5-10.5  03/19/2023: Iron 27 ?g/dL (L)  16/12/9602: Iron Binding-TIBC 167 mcg/dL (L)  54/11/8117: % Saturation 16 % (L)  03/19/2023: Ferritin 554 ng/mL (H)   - Admission hemoglobin 9.8  Plan:  > Trend Hg q12hr   > Transfuse if Hgb <7 or <8 with active bleeding  > DVT Ppx: holding   > Plan for EGD on 12/30   > Continue PPI (12/26 - )   > GI following, appreciate assistance   > Consider IV iron in outpatient setting     #Epistaxis, resolved  > Consulted ENT, appreciate recs   > Continue saline nasal spray q6hr, vaseline BID, and humidified air w/ CPAP    #AD Polycystic Kidney Disease  #CKD Stage IV  - Etiology of CKD most likely combination of T2DM (well controlled), ADPKD, and HFpEF   - Recent admission with AKI due to urinary retention and bilateral pyelonephritis  - Baseline creatinine 2.5-3.0   - Indwelling foley replaced 03/18/23. Plans to get suprapubic catheter placed in February 2025  - Follows with Dr. Philomena Course in the outpatient setting   Plan:  > Replace electrolytes PRN   > Continue to monitor kidney function      #Chronic indwelling foley catheter   #Overactive bladder/Urge Incontinence   #Hx urinary retention   - Patient has a chronic indwelling catheter for urge incontinence/OAB and urinary retention  - Follows with urology in the outpatient setting   Plan:   > Appreciate urology recs     #Gout  #Venous stasis changes bilateral lower extremities   Plan:  > Continue PTA allopurinol 100mg  BID    #DMT2 with long-term insulin use  #Hypothyroidism  - 03/19/23 A1c 5.8  - 03/19/23: TSH 1.94  - PTA Medications: Levothyroxine qAM, Ozempic 1mg  weekly (last dose on 12/20), and aspart LDCF   Plan:  > POC blood glucose monitoring ordered ACHS with LDCF  > Continue PTA levothyroxine qAM  > Continue holding PTA Ozempic    #OSA, compliant with CPAP  - Admission ABG: 7.38/30/189  - Admission CXR: No consolidation, pleural effusion, or pneumothorax  Plan:  > Continue home CPAP     #Restless Leg Syndrome  - PTA Medications: Pramipexole 1.5mg  qHS and gabapentin 100mg  qHS  Plan:  > Continue PTA pramipexole 1.5mg  qHS and gabapentin 100mg  qHS    Nutrition: No Dietitian Consult  Wound: Wound Documentation Wound:      Wounds Moisture associated skin damage Anterior;Right;Left;Medial Pelvis (Active)   03/18/23 1500   Wound Type: Moisture associated skin damage   Orientation: Anterior;Right;Left;Medial   Location: Pelvis   Wound Location Comments:    Initial Wound Site Closure:    Initial Dressing Placed:    Initial Cycle:    Initial Suction Setting (mmHg):    Pressure Injury Stages:    Pressure Injury Present Within 24 Hours of Hospital Admission:    If This Pressure Injury Is Suspected to Be Device Related, Please Select the Device::    Is the Wound Open or Closed:    Wound Assessment Moist;Pink 03/22/23 2050   Peri-wound Assessment Pink;Red 03/22/23 2050   Wound Drainage Amount None 03/22/23 2050   Wound Dressing Status None/open to air 03/22/23 2050   Wound Care Treatment or ointment applied 03/22/23 2050   Wound Dressing and/or Treatment Pharmaceutical treatment (see MAR) 03/22/23 2050   Number of days: 5       Wounds Pressure injury Sacrum (Active)   03/18/23  1500   Wound Type: Pressure injury   Orientation:    Location: Sacrum   Wound Location Comments:    Initial Wound Site Closure:    Initial Dressing Placed:    Initial Cycle:    Initial Suction Setting (mmHg):    Pressure Injury Stages: Stage 1   Pressure Injury Present Within 24 Hours of Hospital Admission:    If This Pressure Injury Is Suspected to Be Device Related, Please Select the Device::    Is the Wound Open or Closed:    Wound Assessment Dressing not removed for assessment 03/22/23 2050   Peri-wound Assessment Intact 03/22/23 2050   Wound Drainage Amount None 03/22/23 2050   Wound Dressing Status Intact 03/22/23 2050   Wound Care Treatment or ointment applied 03/19/23 0000   Wound Dressing and/or Treatment Foam 03/22/23 2050   Number of days: 5       Wounds Puncture Anterior;Right;Proximal Arm (Active)   03/18/23 2045   Wound Type: Puncture   Orientation: Anterior;Right;Proximal   Location: Arm   Wound Location Comments: Forearm   Initial Wound Site Closure:    Initial Dressing Placed:    Initial Cycle:    Initial Suction Setting (mmHg):    Pressure Injury Stages:    Pressure Injury Present Within 24 Hours of Hospital Admission:    If This Pressure Injury Is Suspected to Be Device Related, Please Select the Device::    Is the Wound Open or Closed:    Wound Assessment Red;Purple 03/22/23 2050   Peri-wound Assessment Erythema 03/22/23 2050   Wound Drainage Amount None 03/22/23 2050   Wound Dressing Status None/open to air 03/22/23 2050   Wound Care Treatment or ointment applied 03/19/23 0000   Wound Dressing and/or Treatment Other (Comment) 03/19/23 0000   Number of days: 5                   Checklist:  FEN: no fluids, replace lytes prn, cardiac diet    LDA: PIV x 1 and chronic indwelling foley catheter   VTE PPx: holding   Code status: Full Code   Dispo: Continue admit to cardiology     Chancy Milroy, DO  Internal Medicine, PGY-1  Available on Voalte    Patient seen and discussed with Dr. Beather Arbour     ________________________________________________________________________    Interval History: No acute events overnight. Patient has NAC this morning.     Review of Systems: 10 point comprehensive ROS is negative, except for:  as stated above     Medications:  Scheduled Meds:allopurinoL (ZYLOPRIM) tablet 100 mg, 100 mg, Oral, BID  aspirin chewable tablet 81 mg, 81 mg, Oral, QDAY  atorvastatin (LIPITOR) tablet 80 mg, 80 mg, Oral, QHS  cefTRIAXone (ROCEPHIN) IVP 2 g, 2 g, Intravenous, Q24H*  clotrimazole (LOTRIMIN) 1 % topical cream, , Topical, BID  gabapentin (NEURONTIN) capsule 100 mg, 100 mg, Oral, QHS  [Held by Provider] heparin (porcine) PF syringe 5,000 Units, 5,000 Units, Subcutaneous, Q8H  hydrALAZINE (APRESOLINE) tablet 10 mg, 10 mg, Oral, TID  insulin aspart (U-100) (NOVOLOG FLEXPEN U-100 INSULIN) injection PEN 0-6 Units, 0-6 Units, Subcutaneous, ACHS (22)  isosorbide dinitrate (ISORDIL) tablet 5 mg, 5 mg, Oral, TID  ketoconazole (NIZORAL) 2 % topical cream, , Topical, BID  levothyroxine (SYNTHROID) tablet 75 mcg, 75 mcg, Oral, QDAY  magnesium oxide (MAGOX) tablet 400 mg, 400 mg, Oral, BID  metoprolol succinate XL (TOPROL XL) tablet 25 mg, 25 mg, Oral, QDAY  pantoprazole (PROTONIX) injection 40 mg,  40 mg, Intravenous, BID(11-21)  polyethylene glycol 3350 (MIRALAX) packet 17 g, 1 packet, Oral, QDAY  pramipexole (MIRAPEX) tablet 1.5 mg, 1.5 mg, Oral, QHS  sennosides-docusate sodium (SENOKOT-S) tablet 1 tablet, 1 tablet, Oral, BID  sodium chloride (SEA MIST) 0.65 % nasal spray 2 spray, 2 spray, Each Nostril, QID  ticagrelor (BRILINTA) tablet 90 mg, 90 mg, Oral, BID    Continuous Infusions:  PRN and Respiratory Meds:acetaminophen Q4H PRN, dextrose 50% (D50) IV PRN, ondansetron (ZOFRAN) IV Q6H PRN      Objective:    Physical Exam:  Vital Signs: Last Filed Vital Signs: 24 Hour Range   BP: 112/57 (12/28 0741)  Temp: 36.4 ?C (97.6 ?F) (12/28 1610)  Pulse: 72 (12/28 0741)  Respirations: 16 PER MINUTE (12/28 0741)  SpO2: 96 % (12/28 0741)  O2%: 21 % (12/27 2234)  O2 Device: None (Room air) (12/28 0741) BP: (106-132)/(56-71)   Temp:  [36.2 ?C (97.1 ?F)-36.4 ?C (97.6 ?F)]   Pulse:  [70-85]   Respirations:  [12 PER MINUTE-18 PER MINUTE]   SpO2:  [96 %-100 %]   O2%:  [21 %]   O2 Device: None (Room air)     Physical Exam  Constitutional:       Appearance: He is not toxic-appearing.   HENT:      Head: Normocephalic and atraumatic.      Nose: Nose normal.      Comments: Dried blood in left nasal cavity      Mouth/Throat:      Mouth: Mucous membranes are moist.   Eyes:      General: No scleral icterus.     Extraocular Movements: Extraocular movements intact.      Conjunctiva/sclera: Conjunctivae normal.   Cardiovascular:      Rate and Rhythm: Normal rate and regular rhythm.      Heart sounds: Normal heart sounds.   Pulmonary:      Effort: Pulmonary effort is normal.      Breath sounds: Normal breath sounds. No stridor. No wheezing.   Abdominal:      General: Abdomen is flat. Bowel sounds are normal.      Palpations: Abdomen is soft.      Tenderness: There is no abdominal tenderness.   Skin:     General: Skin is warm and dry.   Neurological:      General: No focal deficit present.      Mental Status: He is alert.   Psychiatric:         Mood and Affect: Mood normal.         Thought Content: Thought content normal.         Judgment: Judgment normal.          Intake/Output Summary (last 24 hr):    Intake/Output Summary (Last 24 hours) at 03/23/2023 9604  Last data filed at 03/22/2023 2031  Gross per 24 hour   Intake 722 ml   Output 1050 ml   Net -328 ml         Last bowel movement: 03/22/23      Lab / Radiology / Other Diagnostic Tests:  Pertinent labs and radiology reviewed.

## 2023-03-23 NOTE — Progress Notes
Patient's midnight vitals deferred for sleep bundle. Telemetry monitoring in place and patient resting in no apparent distress.

## 2023-03-23 NOTE — Progress Notes
 [facility] END OF SHIFT/PLAN OF CARE NURSING NOTE   Nursing Shift: Other: 2300-0700    Acute events, nursing interventions, & communication with providers: Patient care assumed at 2300. Patient assessment completed and patient's plan of care reviewed. Patient denied questions/concerns at this time. Due to patient mobility impairment, bed weight was used for ongoing trend of patient's fluid status.       Patient Goal(s)  Patient will Maintain stable fluid volume with clear breath sounds and vital signs within normal limit by the end of next shift.        Patient will  Maintain stable fluid volume with clear breath sounds and vital signs within normal limit by discharge.   Admission Weight: Weight: 95.3 kg (210 lb)    Last 3 Weights:   Vitals:    03/21/23 0431 03/22/23 0334 03/23/23 0640   Weight: 96.4 kg (212 lb 8.4 oz) 94 kg (207 lb 3.7 oz) 96.7 kg (213 lb 3 oz)     Weight Change: Weight trend increasing    Intake/Output Summary (Last 24 hours) at 03/23/2023 0254  Last data filed at 03/22/2023 2031  Gross per 24 hour   Intake 962 ml   Output 1600 ml   Net -638 ml     Last Bowel Movement Date: 03/22/23    Fluid Restriction? No   Quality/Safety    Total Fall Risk Score: 11   Risk for Injury related to falls: Coagulopathies/risk for bleed  Fall Risk Category:   History of More Than One Fall Within 6 Months Before Admission: No  Elimination, Bowel and Urine: Incontinence OR urgency OR frequency  Interventions: Use of bladder management device (e.g., male/male external urinary containment device)   Medications: On 2 or more high fall risk drugs  Interventions: Use of gait belt , Stay within arm's reach during toileting/showering (i.e., dizziness, orthostasis) , Educate patient on medication side effects, and Bed/chair alarm (i.e., change in mental status)   Patient Care Equipment: N/A  Interventions: Needs assistance with patient care equipment when ambulating, Ensure environment is free of clutter and walkways are clear from tripping hazards, and Assess need for patient equipment and remove if not in use  Mobility: 2 - Assistance required  Interventions: Assist x2, Gait belt in use when ambulating, Use of additional staff for handling patient equipment, Utilize walker, cane, or additional walking aid for ambulation, and Elimination equipment at bedside (urinal or commode)   Cognition: 0 - No cognition issues  Interventions: Bed/Chair Alarm , Stay within arm's reach while patient ambulating/toileting/showering, and Increase frequency of purposeful rounding    Other safety precautions in place: HAPI Prevention Bundle    Restraints:  No      Patient Education  This RN provided education to Patient and Significant Other today. The following education topics were reviewed:  Quality/Safety Education:   CAUTI prevention, Fall risk, and VTE prophylaxis  Medication Education:   Medication management (Indication, adverse effects, monitoring, etc)  Education provided on the following medication(s): Timing of synthroid with other medications  Cardiac - Specific Education:   Heart failure management  General Education:   Bowel/Urinary elimination, Diet/nutrition, and Mobility/Activity intolerance    The following teaching method(s) were used: Verbal  Response to learning: [employer]

## 2023-03-24 VITALS — Wt 218.0 lb

## 2023-03-24 VITALS — HR 82

## 2023-03-24 VITALS — HR 93

## 2023-03-24 VITALS — BP 101/57 | HR 76 | Temp 98.20000°F

## 2023-03-24 VITALS — BP 118/58 | HR 81 | Temp 98.60000°F

## 2023-03-24 VITALS — BP 103/55 | HR 76 | Temp 97.10000°F

## 2023-03-24 VITALS — BP 118/58 | HR 80 | Temp 98.00000°F

## 2023-03-24 VITALS — BP 88/47 | HR 80 | Temp 97.30000°F

## 2023-03-24 VITALS — HR 76

## 2023-03-24 VITALS — HR 75

## 2023-03-24 VITALS — HR 79

## 2023-03-24 LAB — CBC AND DIFF
~~LOC~~ BKR ABSOLUTE BASO COUNT: 0 10*3/uL — ABNORMAL LOW (ref 0.00–0.20)
~~LOC~~ BKR ABSOLUTE EOS COUNT: 0.2 10*3/uL — ABNORMAL HIGH (ref 0.00–0.45)
~~LOC~~ BKR ABSOLUTE MONO COUNT: 0.5 10*3/uL — ABNORMAL HIGH (ref 0.00–0.80)

## 2023-03-24 LAB — POC GLUCOSE
~~LOC~~ BKR POC GLUCOSE: 149 mg/dL — ABNORMAL HIGH (ref 70–100)
~~LOC~~ BKR POC GLUCOSE: 115 mg/dL — ABNORMAL HIGH (ref 70–100)
~~LOC~~ BKR POC GLUCOSE: 167 mg/dL — ABNORMAL HIGH (ref 70–100)
~~LOC~~ BKR POC GLUCOSE: 194 mg/dL — ABNORMAL HIGH (ref 70–100)

## 2023-03-24 MED ORDER — MAGNESIUM SULFATE IN D5W 1 GRAM/100 ML IV PGBK
1 g | INTRAVENOUS | 0 refills | Status: CP
Start: 2023-03-24 — End: ?
  Administered 2023-03-24: 15:00:00 1 g via INTRAVENOUS

## 2023-03-24 MED ORDER — CEFTRIAXONE INJ 2GM IVP
2 g | INTRAVENOUS | 0 refills | Status: DC
Start: 2023-03-24 — End: 2023-03-25

## 2023-03-24 NOTE — Progress Notes
 RT Adult Assessment Note    NAME:Paul Benjamin             MRN: [id number]             DOB:1945/08/11          AGE: 77 y.o.  ADMISSION DATE: 03/18/2023             DAYS ADMITTED: LOS: 6 days    Additional Comments:  Impressions of the patient: Patient appears clinically stable on room air.  Intervention(s)/outcome(s): Respiratory evaluation completed.      Vital Signs:  Pulse: 82  RR: 16 PER MINUTE  SpO2: 100 %  O2 Device: None (Room air)  Liter Flow:    O2%:      Breath Sounds:   Breath Sounds WDL: Exceptions to WDL  Right Base Breath Sounds: Decreased  Left Base Breath Sounds: Decreased  All Breath Sounds: Clear (Implies normal);Decreased  Respiratory Effort:   Respiratory WDL: Within Defined Limits  Respiratory Effort/Pattern: Unlabored  Comments:

## 2023-03-24 NOTE — Progress Notes
 CV Staff    Impressions:    Septic shock/urosepsis/complicated urinary tract infection    CAD (coronary artery disease)    NSTEMI (non-ST elevated myocardial infarction) s/p PCI to RCA    Ischemic cardiomyopathy and Acute heart failure with reduced ejection fraction (HFrEF, <= 40%)    Hypotension: this, along with his renal impairment, has limited any GDMT for HF aside from low dose beta blockade     Mixed dyslipidemia    CKD stage 4     Type 2 diabetes mellitus    Chronic indwelling Foley catheter    Lower extremity cellulitis    Anemia, with worsening Hgb compared to admission and melena reported by nursing staff today--?blood loss superimposed on ACD?    Epistaxis, now resolved    We are stopping his low dose hydralazine/isosorbide dinitrate since he has had hypotension associated with that.    We again reviewed the plans for ongoing management, including EGD tomorrow.  I am glad to see that he does not appear to be having signs of active GI bleeding, but we will know more once the endoscopy has been completed.    He is not exhibiting signs of either angina or decompensated heart failure, fortunately.

## 2023-03-24 NOTE — Progress Notes
 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 man with a past medical history of AD polycystic kidney disease, HFpEF, CKD Stage IV, T2DM, anemia of chronic disease, history of complicated UTI with urinary retention and bilateral pyelonephritis, urge incontinence, chronic indwelling foley catheter with plans to transition to suprapubic in February of 2025, recent history of lower extremity cellulitis, historically low blood pressure with transition from Coreg to metoprolol, SSS s/p dual chamber PPM (2008), afib s/p ablation (2009), HTN, CAD s/p stenting x2 to LAD and proximal portion of diagonal branch (2005), dyslipidemia, and OSA compliant with CPAP who presented to [facility] as a transfer from [facility] and was found to be in septic shock most likely from the GU tract with increasing troponin and reported chest pain. He was taken to cath lab on 12/23 with stenting x2 to the proximal RCA. He was then transferred to the CICU for monitoring s/p PCI on IV cefepime and Levophed.      Urine culture positive for pan sensitive Serratia marcescens. Antibiotics de-escalated to Rocephin on 12/25. GDMT limited due to advanced CKD. Initiation of BB on 12/25 as the patient's BP stable off of pressor support. Hospital course complicated by melena and epistaxis, Hg remaining stable. Tentative plan for EGD on 12/30.     Interval Updates:   > Plan for EGD tomorrow. Patient is NPO at [facility]   > Continue PPI and monitoring Hg q12hr   > Plan to complete 7 day course of antibiotics. BC remain negative   > Discontinue isosorbide mononitrate and hydralazine today due to BP intolerance   > Continue to hold PTA Lasix     #Urosepsis   #History of complicated UTIs   #Intertrigo complicated by candidal infection  - Recent history of UTI treated with Augmentin transitioned to Keflex in early December, bilateral pyelonephritis treated with Zosyn transitioned to Augmentin early November, and doxycycline 100 mg BID for cellulitis 11/5 - 11/14  - Admission UA showing packed WBCs, 20-50 RBCs, moderate bacteria but was nitrite negative. 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   - Admission CXR without signs of consolidation  - 03/18/23 Blood Cx: NGTD  - 03/18/2023 Urine Cx: Serratia marcescens   - Antibiotics:               Meropenem (12/23 single dose at outside hospital)               Cefepime (12/23 - 12/25)  Rocephin (12/25 - )   Plan:  > Continue clotrimazole topical cream BID for candidal intertrigo  > Wound team following  > Continue Rocephin (12/25 - ) with plan to completed a seven day course  > ID following, appreciate recs     #NSTEMI s/p PCI with stenting x2 to proximal RCA (03/18/23)   #CAD s/p PCI to LAD and proximal portion of diagonal branch (2005)  #HFpEF  #SSS s/p dual chamber PPM (2008)  #Afib s/p ablation (2009)  #Mixed dyslipidemia  - Pressors: levophed titrated to goal MAP > 65   - Admission EKG: Sinus rhythm with first degree AV block and left axis deviation  - Post cath EKG: Sinus rhythm with first degree AV block and left axis deviation  - Admission Echo: moderately reduced LV systolic function with EF 30-35% (previously 56% 01/2023); new regional wall motion abnormalities (akinesis of inferoseptal and inferolateral walls and apical segments; hypokinesis of basal inferoseptal, basal inferior, mid anteroseptal, and mid inferior walls); moderately to severely dilated RV with mild RV systolic dysfunction; known bicuspid aortic valve  - Trop: 3 (OSH); 12,690 (admission); 14,792 (4 hours post admission)  - 12/23 PCI with stenting x 2 to proximal RCA        Lab Results   Component Value Date     CHOL 76 03/19/2023     TRIG 123 03/19/2023     HDL 26 (L) 03/19/2023     LDL 38 03/19/2023     VLDL 24.6 03/19/2023     NONHDLCHOL 50 03/19/2023     CHOLHDLC 4 06/05/2021      Guideline Directed Medical Therapy PTA Inpatient Changes   ACEI/ARB No  Renal dysfunction     ARNI No Renal dysfunction     BB On Toprol 25 mg daily  Continue PTA Toprol   SGLT-2 Inhibitor No UTIs, current or frequent     Mineralocorticoid Receptor Antagonist No Renal dysfunction     Hydralazine/Nitrate No Hypotension     Ivabradine No; Hypotension     Heart Rhythm Management Therapy Yes (PPM)     Anticoagulation None with history of Afib/flutter  DAPT s/p PCI    Cardiac Rehab Evaluation for LVEF < or equal to 40% Yes; Date ordered: 12/23     7-Day post hospital follow up scheduled within 24-48 hours of discharge No Discharge not expected within the next 24-48 hours        - PTA meds: Atorvastatin 40mg  qHS, furosemide 40mg  qAM, metoprolol succinate 12.5mg  daily, and sublingual nitro  - Device interrogation: 9.1% RV paced (programmed MVP-->RV pacing likely r/t intermittent second degree AVB). Cardiac compass shows fairly significant increase in RV pacing over the last 1-2days  - HgA1c: 5.8%   Plan:  > Continue increased dose of atorvastatin 80 mg qHS  > Continue to hold PTA furosemide   > Tentative plan for repeat TTE in the outpatient setting to reassess LV function  > Continue PTA metoprolol succinate   > Will not start ACEi/ARB and spironolactone due to advanced CKD. Patient would not be a good candidate for SGLUT2 inhibitor due to chronic indwelling catheter with history of complicated UTIs  > Discontinue hydralazine and isosorbide mononitrate as BP has not tolerated   > Continue aspirin 81mg  daily and ticagrelor 90mg  BID  > Continue monitoring patient on telemetry. Replace lytes prn, K > 4, Mg > 2     #Suspected melena   #Acute on chronic normocytic normochromic anemia   #Hx anemia of  chronic disease  - Baseline hemoglobin 9.5-10.5  03/19/2023: Iron 27 ?g/dL (L)  13/10/6576: Iron Binding-TIBC 167 mcg/dL (L)  46/96/2952: % Saturation 16 % (L)  03/19/2023: Ferritin 554 ng/mL (H)   - Admission hemoglobin 9.8  Plan:  > Trend Hg q12hr   > Transfuse if Hgb <7 or <8 with active bleeding  > DVT Ppx: holding   > Plan for EGD tomorrow, patient NPO at [facility]   > Continue PPI (12/26 - )   > GI following, appreciate assistance   > Consider IV iron in outpatient setting     #Epistaxis, resolved  > Consulted ENT, appreciate recs   > Continue saline nasal spray q6hr, vaseline BID, and humidified air w/ CPAP    #AD Polycystic Kidney Disease  #CKD Stage IV  - Etiology of CKD most likely combination of T2DM (well controlled), ADPKD, and HFpEF   - Recent admission with AKI due to urinary retention and bilateral pyelonephritis  - Baseline creatinine 2.5-3.0   - Indwelling foley replaced 03/18/23. Plans to get suprapubic catheter placed in February 2025  - Follows with Dr. Philomena Course in the outpatient setting   Plan:  > Replace electrolytes PRN   > Continue to monitor kidney function      #Chronic indwelling foley catheter   #Overactive bladder/Urge Incontinence   #Hx urinary retention   - Patient has a chronic indwelling catheter for urge incontinence/OAB and urinary retention  - Follows with urology in the outpatient setting   Plan:   > Appreciate urology recs     #Gout  #Venous stasis changes bilateral lower extremities   Plan:  > Continue renal dosed allopurinol 100mg  daily    #DMT2 with long-term insulin use  #Hypothyroidism  - 03/19/23 A1c 5.8  - 03/19/23: TSH 1.94  - PTA Medications: Levothyroxine qAM, Ozempic 1mg  weekly (last dose on 12/20), and aspart LDCF   Plan:  > POC blood glucose monitoring ordered ACHS with LDCF  > Continue PTA levothyroxine qAM  > Continue holding PTA Ozempic    #OSA, compliant with CPAP  - Admission ABG: 7.38/30/189  - Admission CXR: No consolidation, pleural effusion, or pneumothorax  Plan:  > Continue home CPAP     #Restless Leg Syndrome  - PTA Medications: Pramipexole 1.5mg  qHS and gabapentin 100mg  qHS  Plan:  > Continue renal dosed pramipexole 0.75mg  qHS and gabapentin 100mg  qHS    Nutrition: No Dietitian Consult  Wound: Wound Documentation Wound:      Wounds Moisture associated skin damage Anterior;Right;Left;Medial Pelvis (Active)   03/18/23 1500   Wound Type: Moisture associated skin damage   Orientation: Anterior;Right;Left;Medial   Location: Pelvis   Wound Location Comments:    Initial Wound Site Closure:    Initial Dressing Placed:    Initial Cycle:    Initial Suction Setting (mmHg):    Pressure Injury Stages:    Pressure Injury Present Within 24 Hours of Hospital Admission:    If This Pressure Injury Is Suspected to Be Device Related, Please Select the Device::    Is the Wound Open or Closed:    Wound Assessment Moist;Pink 03/24/23 0845   Peri-wound Assessment Pink;Red 03/24/23 0845   Wound Drainage Amount None 03/24/23 0845   Wound Dressing Status None/open to air 03/24/23 0845   Wound Care Treatment or ointment applied 03/23/23 2130   Wound Dressing and/or Treatment Pharmaceutical treatment (see MAR) 03/23/23 2130   Number of days: 6       Wounds Pressure injury  Sacrum (Active)   03/18/23 1500   Wound Type: Pressure injury   Orientation:    Location: Sacrum   Wound Location Comments:    Initial Wound Site Closure:    Initial Dressing Placed:    Initial Cycle:    Initial Suction Setting (mmHg):    Pressure Injury Stages: Stage 1   Pressure Injury Present Within 24 Hours of Hospital Admission:    If This Pressure Injury Is Suspected to Be Device Related, Please Select the Device::    Is the Wound Open or Closed:    Wound Assessment Dressing not removed for assessment 03/24/23 0845   Peri-wound Assessment Intact 03/24/23 0845   Wound Drainage Amount None 03/24/23 0845   Wound Dressing Status Intact 03/24/23 0845   Wound Care Treatment or ointment applied 03/19/23 0000   Wound Dressing and/or Treatment Foam 03/24/23 0845   Number of days: 6                   Checklist:  FEN: no fluids, replace lytes prn, cardiac diet    LDA: PIV x 1 and chronic indwelling foley catheter   VTE PPx: holding   Code status: Full Code   Dispo: Continue admit to cardiology     Chancy Milroy, DO  Internal Medicine, PGY-1  Available on [employer]    Patient seen and discussed with Dr. Beather Arbour     ________________________________________________________________________    Interval History: No acute events overnight. Patient has NAC this morning.     Review of Systems: 10 point comprehensive ROS is negative, except for:  as stated above     Medications:  Scheduled Meds:allopurinoL (ZYLOPRIM) tablet 100 mg, 100 mg, Oral, QDAY  aspirin chewable tablet 81 mg, 81 mg, Oral, QDAY  atorvastatin (LIPITOR) tablet 80 mg, 80 mg, Oral, QHS  cefTRIAXone (ROCEPHIN) IVP 2 g, 2 g, Intravenous, Q24H*  clotrimazole (LOTRIMIN) 1 % topical cream, , Topical, BID  gabapentin (NEURONTIN) capsule 100 mg, 100 mg, Oral, QHS  [Held by Provider] heparin (porcine) PF syringe 5,000 Units, 5,000 Units, Subcutaneous, Q8H  hydrALAZINE (APRESOLINE) tablet 10 mg, 10 mg, Oral, TID  insulin aspart (U-100) (NOVOLOG FLEXPEN U-100 INSULIN) injection PEN 0-6 Units, 0-6 Units, Subcutaneous, ACHS (22)  isosorbide dinitrate (ISORDIL) tablet 5 mg, 5 mg, Oral, TID  ketoconazole (NIZORAL) 2 % topical cream, , Topical, BID  levothyroxine (SYNTHROID) tablet 75 mcg, 75 mcg, Oral, QDAY  magnesium sulfate   1 g/D5W 100 mL IVPB, 1 g, Intravenous, Q4H  metoprolol succinate XL (TOPROL XL) tablet 25 mg, 25 mg, Oral, QDAY  pantoprazole (PROTONIX) injection 40 mg, 40 mg, Intravenous, BID(11-21)  polyethylene glycol 3350 (MIRALAX) packet 17 g, 1 packet, Oral, QDAY  pramipexole (MIRAPEX) tablet 0.75 mg, 0.75 mg, Oral, QHS  sennosides-docusate sodium (SENOKOT-S) tablet 1 tablet, 1 tablet, Oral, BID  sodium chloride (SEA MIST) 0.65 % nasal spray 2 spray, 2 spray, Each Nostril, QID  ticagrelor (BRILINTA) tablet 90 mg, 90 mg, Oral, BID    Continuous Infusions:  PRN and Respiratory Meds:acetaminophen Q4H PRN, dextrose 50% (D50) IV PRN, ondansetron (ZOFRAN) IV Q6H PRN      Objective:    Physical Exam:  Vital Signs: Last Filed Vital Signs: 24 Hour Range   BP: 118/58 (12/29 0741)  Temp: 37 ?C (98.6 ?F) (12/29 1610)  Pulse: 81 (12/29 0741)  Respirations: 16 PER MINUTE (12/29 0741)  SpO2: 100 % (12/29 0741)  O2 Device: None (Room air) (12/29 0741) BP: (93-118)/(46-59)   Temp:  [36.6 ?  C (97.8 ?F)-37 ?C (98.6 ?F)]   Pulse:  [71-81]   Respirations:  [16 PER MINUTE-20 PER MINUTE]   SpO2:  [97 %-100 %]   O2 Device: None (Room air)     Physical Exam  Constitutional:       Appearance: He is not toxic-appearing.   HENT:      Head: Normocephalic and atraumatic.      Nose: Nose normal.      Comments: Dried blood in left nasal cavity      Mouth/Throat:      Mouth: Mucous membranes are moist.   Eyes:      General: No scleral icterus.     Extraocular Movements: Extraocular movements intact.      Conjunctiva/sclera: Conjunctivae normal.   Cardiovascular:      Rate and Rhythm: Normal rate and regular rhythm.      Heart sounds: Normal heart sounds.   Pulmonary:      Effort: Pulmonary effort is normal.      Breath sounds: Normal breath sounds. No stridor. No wheezing.   Abdominal:      General: Abdomen is flat. Bowel sounds are normal.      Palpations: Abdomen is soft.      Tenderness: There is no abdominal tenderness.   Skin:     General: Skin is warm and dry.   Neurological:      General: No focal deficit present.      Mental Status: He is alert.   Psychiatric:         Mood and Affect: Mood normal.         Thought Content: Thought content normal.         Judgment: Judgment normal.          Intake/Output Summary (last 24 hr):    Intake/Output Summary (Last 24 hours) at 03/24/2023 0918  Last data filed at 03/24/2023 0600  Gross per 24 hour   Intake 200 ml   Output 1100 ml   Net -900 ml         Last bowel movement: 03/24/23      Lab / Radiology / Other Diagnostic Tests:  Pertinent labs and radiology reviewed.

## 2023-03-24 NOTE — Case Management (ED)
 Case Management Progress Note    NAME:Paul Benjamin                          MRN: [id number]              DOB:05-24-1945          AGE: 77 y.o.  ADMISSION DATE: 03/18/2023             DAYS ADMITTED: LOS: 6 days      Today's Date: 03/24/2023    PLAN: Remaining inpatient, EGD pending    Expected Discharge Date: 03/26/2023   Is Patient Medically Stable: No, Please explain: EGD plans  Are there Barriers to Discharge? no    INTERVENTION/DISPOSITION:  Discharge Planning                   Weekend NCM reviewed EMR, pt will not DC this weekend. [facility] be NPO at [facility] with plans for EGD tomorrow.    Transportation              Does the Patient Need Case Management to Arrange Discharge Transport? (ex: facility, ambulance, wheelchair/stretcher, Medicaid, cab, other): No  Will the Patient Use Family Transport?: Yes  Transportation Name, Phone and Availability #1: wife or daughter  Support                 Info or Referral                 Positive SDOH Domains and Potential Barriers    Medication Needs     Estate manager/land agent                 Other                 Discharge Disposition          Selected Continued Care - Admitted Since 03/18/2023       [facility]       Service Provider Services Address Phone Fax Patient Preferred    [facility] [address] [place] [place] [id number] [phone] --                  Ruben Im BSN, RN, CCRN, SCRN  Nurse Case Manager  Neuro ICU and ENT  Available on [facility]  Ext [phone]

## 2023-03-25 ENCOUNTER — Inpatient Hospital Stay: Admit: 2023-03-25 | Discharge: 2023-03-25 | Payer: MEDICARE

## 2023-03-25 ENCOUNTER — Encounter: Admit: 2023-03-25 | Discharge: 2023-03-25 | Payer: MEDICARE

## 2023-03-25 VITALS — BP 110/59 | HR 96 | Temp 98.20000°F

## 2023-03-25 VITALS — BP 84/52 | HR 70

## 2023-03-25 VITALS — HR 76

## 2023-03-25 VITALS — BP 82/53 | HR 76

## 2023-03-25 VITALS — HR 85

## 2023-03-25 VITALS — BP 110/50 | HR 76 | Temp 97.80000°F

## 2023-03-25 VITALS — Wt 216.1 lb

## 2023-03-25 VITALS — BP 104/61 | HR 82 | Temp 98.80000°F

## 2023-03-25 VITALS — BP 116/54 | HR 78 | Temp 99.00000°F

## 2023-03-25 VITALS — BP 98/50 | Temp 97.80000°F

## 2023-03-25 VITALS — BP 89/55 | HR 75

## 2023-03-25 VITALS — HR 96

## 2023-03-25 VITALS — BP 105/54 | HR 72 | Temp 97.50000°F

## 2023-03-25 VITALS — BP 108/59 | HR 100 | Temp 99.00000°F

## 2023-03-25 VITALS — BP 111/55 | HR 71 | Temp 97.80000°F

## 2023-03-25 DIAGNOSIS — R0789 Other chest pain: Principal | ICD-10-CM

## 2023-03-25 LAB — EGD REPORT

## 2023-03-25 LAB — CBC
~~LOC~~ BKR HEMATOCRIT: 24 % — ABNORMAL LOW (ref 40.0–50.0)
~~LOC~~ BKR HEMOGLOBIN: 8.1 g/dL — ABNORMAL LOW (ref 13.5–16.5)
~~LOC~~ BKR MCH: 28 pg — ABNORMAL LOW (ref 26.0–34.0)
~~LOC~~ BKR MCHC: 32 g/dL (ref 32.0–36.0)
~~LOC~~ BKR MCV: 88 fL (ref 80.0–100.0)
~~LOC~~ BKR MPV: 7.1 fL (ref 7.0–11.0)
~~LOC~~ BKR PLATELET COUNT: 190 10*3/uL — ABNORMAL LOW (ref 150–400)
~~LOC~~ BKR RBC COUNT: 2.8 10*6/uL — ABNORMAL LOW (ref 4.40–5.50)
~~LOC~~ BKR RDW: 19 % — ABNORMAL HIGH (ref 11.0–15.0)
~~LOC~~ BKR WBC COUNT: 9.1 10*3/uL — ABNORMAL HIGH (ref 4.50–11.00)

## 2023-03-25 LAB — CBC AND DIFF
~~LOC~~ BKR ABSOLUTE BASO COUNT: 0 10*3/uL — ABNORMAL LOW (ref 0.00–0.20)
~~LOC~~ BKR ABSOLUTE EOS COUNT: 0.1 10*3/uL — ABNORMAL LOW (ref 0.00–0.45)
~~LOC~~ BKR ABSOLUTE MONO COUNT: 0.5 10*3/uL — ABNORMAL HIGH (ref 0.00–0.80)

## 2023-03-25 LAB — COMPREHENSIVE METABOLIC PANEL
~~LOC~~ BKR ANION GAP: 8 10*3/uL — ABNORMAL HIGH (ref 3–12)
~~LOC~~ BKR GLOMERULAR FILTRATION RATE (GFR): 22 mL/min — ABNORMAL LOW (ref >60–4.80)

## 2023-03-25 LAB — POC GLUCOSE
~~LOC~~ BKR POC GLUCOSE: 187 mg/dL — ABNORMAL HIGH (ref 70–100)
~~LOC~~ BKR POC GLUCOSE: 105 mg/dL — ABNORMAL HIGH (ref 70–100)
~~LOC~~ BKR POC GLUCOSE: 109 mg/dL — ABNORMAL HIGH (ref 70–100)
~~LOC~~ BKR POC GLUCOSE: 131 mg/dL — ABNORMAL HIGH (ref 70–100)
~~LOC~~ BKR POC GLUCOSE: 144 mg/dL — ABNORMAL HIGH (ref 70–100)

## 2023-03-25 MED ORDER — CEFTRIAXONE INJ 1GM IVP
1 g | INTRAVENOUS | 0 refills | Status: CP
Start: 2023-03-25 — End: ?
  Administered 2023-03-25: 15:00:00 1 g via INTRAVENOUS

## 2023-03-25 MED ORDER — LIDOCAINE (PF) 20 MG/ML (2 %) IJ SOLN
INTRAVENOUS | 0 refills | Status: DC
Start: 2023-03-25 — End: 2023-03-25

## 2023-03-25 MED ORDER — PHENYLEPHRINE HCL IN 0.9% NACL 1MG/10ML INFUSION (AN)(OSM)
INTRAVENOUS | 0 refills | Status: DC
Start: 2023-03-25 — End: 2023-03-25
  Administered 2023-03-25: 16:00:00 .4 ug/kg/min via INTRAVENOUS

## 2023-03-25 MED ORDER — PROPOFOL INJ 10 MG/ML IV VIAL
INTRAVENOUS | 0 refills | Status: DC
Start: 2023-03-25 — End: 2023-03-25

## 2023-03-25 MED ORDER — MAGNESIUM OXIDE 400 MG (241.3 MG MAGNESIUM) PO TAB
400 mg | Freq: Once | ORAL | 0 refills | Status: CP
Start: 2023-03-25 — End: ?
  Administered 2023-03-25: 18:00:00 400 mg via ORAL

## 2023-03-25 MED ORDER — PROPOFOL 10 MG/ML IV EMUL 20 ML (INFUSION)(AM)(OR)
INTRAVENOUS | 0 refills | Status: DC
Start: 2023-03-25 — End: 2023-03-25
  Administered 2023-03-25: 16:00:00 100 ug/kg/min via INTRAVENOUS

## 2023-03-25 MED ORDER — PANTOPRAZOLE 40 MG PO TBEC
40 mg | Freq: Two times a day (BID) | ORAL | 0 refills | Status: DC
Start: 2023-03-25 — End: 2023-03-26
  Administered 2023-03-26 (×2): 40 mg via ORAL

## 2023-03-25 NOTE — Anesthesia Post-Procedure Evaluation
Post-Anesthesia Evaluation    Name: Paul Benjamin      MRN: [id number]     DOB: 1945-07-05     Age: 77 y.o.     Sex: male   __________________________________________________________________________     Procedure Information       Anesthesia Start Date/Time: 03/25/23 0953    Procedure: ESOPHAGOGASTRODUODENOSCOPY WITH BIOPSY - FLEXIBLE    Location: ENDO 3 / ENDO/GI    Surgeons: Meyer Cory, MD            Post-Anesthesia Vitals  BP: 105/54 (12/30 1126)  Temp: 36.4 ?C (97.5 ?F) (12/30 1126)  Pulse: 72 (12/30 1126)  Respirations: 18 PER MINUTE (12/30 1126)  SpO2: 100 % (12/30 1126)  O2 Device: None (Room air) (12/30 1126)   Vitals Value Taken Time   BP 89/55 03/25/23 1030   Temp 36.6 ?C (97.8 ?F) 03/25/23 1015   Pulse 75 03/25/23 1030   Respirations 18 PER MINUTE 03/25/23 1030   SpO2 96 % 03/25/23 1030   O2 Device None (Room air) 03/25/23 1020   ABP     ART BP           Post Anesthesia Evaluation Note    Evaluation location: Pre/Post  Patient participation: recovered; patient participated in evaluation  Level of consciousness: alert  Pain management: adequate    Hydration: normovolemia  Temperature: 36.0?C - 38.4?C  Airway patency: adequate    Perioperative Events       Post-op nausea and vomiting: no PONV    Postoperative Status  Cardiovascular status: hemodynamically stable  Respiratory status: spontaneous ventilation  Follow-up needed: none        Perioperative Events  There were no known complications for this encounter.

## 2023-03-25 NOTE — Progress Notes
 EGD/Upper EUS/ERCP/Antegrade Enteroscopy                                Post Upper Endoscopy Instructions      -Nothing to eat or drink for 1.5 hours after your procedure if you have had the numbing gargle or spray.  Start with small sips of water at _____________.  If tolerated well, you may advance your diet as tolerated or directed by your physician.      -You may have a sore throat after the procedure for 2-3 days.  Try sucrets or lozenges to help ease the pain.  If it continues please contact us.    -If you feel feverish, have a temperature of 101 degrees or higher, persistent nausea and vomiting, abdominal pain or dark stools; please notify your nurse or GI physician.    -You may have abdominal cramping following the procedure this can be relieved by belching or passing air.    -If you have redness or swelling at the IV site, place a warm, wet washcloth over the affected areas for 15 minutes, 3-4 times a day until the redness subsides.  If symptoms continue for 2-3 days, contact your regular physician.    - If you have bleeding from your mouth, over 2 tablespoons and increasing, please notify your physician.  A small amount of bleeding is normal if a biopsy or polyps were taken.  If you are vomiting blood you need to seek immediate medical attention.    - You may resume all your routine medications, if medications need to be held your physician and/or nurse will notify you post procedure.    -If biopsies were taken your provider will call you with the results in 3-7 days.    SPECIFIC INSTRUCTIONS  INPATIENTS:  Ask for help when you get up in your room, as you may still be drowsy from your sedation.    OUTPATIENTS:  Because of sedation and lack of coordination, UNTIL TOMORROW, DO NOT:  Operate any motorized vehicle - this includes driving.  Sign any legal documents or conduct important business matters.  Use any dangerous machinery (chain saw, lawnmower, etc.).  Drink any alcoholic beverages.    -Should you have any questions or concerns after your procedure please call [phone] M-F 8am-5:00 pm. After 5:00 pm, holidays or weekends call [phone] and ask for the GI Doctor on call.

## 2023-03-25 NOTE — Case Management (ED)
 CMA Note:       Delivered a SNF list to pt bedside per the request from Fenton Malling, [facility].     Carlynn Spry  Case Management Assistant  For additional assistance please contact SWCM  *

## 2023-03-25 NOTE — Anesthesia Pre-Procedure Evaluation
Anesthesia Pre-Procedure Evaluation    Name: Paul Benjamin      MRN: [id number]     DOB: [birth year]     Age: 77 y.o.     Sex: male   _________________________________________________________________________     Procedure Info:   Procedure Information       Date/Time: 03/25/23 0940    Procedure: ESOPHAGOGASTRODUODENOSCOPY WITH BIOPSY - FLEXIBLE    Location: ENDO 3 / ENDO/GI    Surgeons: Meyer Cory, MD            [name] is a 77/M with hx noted below admitted as a transfer from OSH and was found to be in septic shock most likely from the GU tract with increasing troponin and reported chest pain. He was taken to cath lab on 12/23 with stenting x2 to the proximal RCA. He was then transferred to the CICU for monitoring s/p PCI. He has suspected melena with drop in Hgb c/f GIB. Plan to proceed with EGD.        Physical Assessment  Vital Signs (last filed in past 24 hours):  BP: 104/61 (12/30 0737)  Temp: 37.1 ?C (98.8 ?F) (12/30 9811)  Pulse: 82 (12/30 0737)  Respirations: 19 PER MINUTE (12/30 0737)  SpO2: 95 % (12/30 0737)  O2%: 21 % (12/29 2130)  O2 Device: None (Room air) (12/30 0737)  Weight: 98 kg (216 lb 0.8 oz) (12/30 0500)     Glucose, POC   Date/Time Value Ref Range Status   03/25/2023 0759 131 (H) 70 - 100 mg/dL Final   91/47/8295 6213 87 70 - 100 Final      Patient History   Allergies   Allergen Reactions    Ciprofloxacin RASH    Lisinopril SEE COMMENTS     hyperkalemia    Cefuroxime HIVES     Tolerated augmentin Aug 2024 and zosyn in Nov 2024    Clindamycin RASH    Plavix [Clopidogrel] RASH    Sulfa (Sulfonamide Antibiotics) HIVES    Quinine SEE COMMENTS     Allergy recorded in SMS: QUININE        Current Medications    Medication Directions   allopurinol (ZYLOPRIM) 100 mg tablet Take one tablet by mouth twice daily.   atorvastatin (LIPITOR) 40 mg tablet Take one tablet by mouth daily.   blood-glucose sensor (FREESTYLE LIBRE 3 SENSOR) sensor device Use one each as directed daily.   CHOLEcalciferoL (vitamin D3) (OPTIMAL D3) 50,000 units capsule Take one capsule by mouth every 7 days. Wednesday.   docusate (COLACE) 100 mg capsule Take one capsule by mouth daily.   furosemide (LASIX) 20 mg tablet Take two tablets by mouth every morning.   gabapentin (NEURONTIN) 100 mg capsule Take one capsule by mouth at bedtime daily.   insulin aspart (U-100) (NOVOLOG FLEXPEN U-100 INSULIN) 100 unit/mL (3 mL) PEN Inject zero Units to six Units under the skin three times daily with meals. Correction Factor if: BS 181-220=2 units, BS 221-260=4 units, BS 261-300=6 units, BS 301-350=8 units, BS 351-400=10 units, BS >400=12 units  Indications: type 2 diabetes mellitus   levothyroxine (SYNTHROID) 75 mcg tablet Take one tablet by mouth daily.   metoprolol succinate XL (TOPROL XL) 25 mg extended release tablet Take one-half tablet by mouth daily. Indications: high blood pressure   nitroglycerin (NITROSTAT) 0.4 mg tablet Place 1 tablet under tongue every 5 minutes as needed for Chest Pain.   polyethylene glycol 3350 (MIRALAX) 17 g packet Take one packet by mouth daily  as needed (Constipation - Second Line). Indications: constipation   pramipexole (MIRAPEX) 1.5 mg PO Tab Take one tablet by mouth at bedtime daily.   semaglutide (OZEMPIC) 1 mg/dose (4 mg/3 mL) injection PEN Inject 1 mg under the skin every 7 days.   sennosides-docusate sodium (SENOKOT-S) 8.6/50 mg tablet Take one tablet by mouth daily as needed (Constipation - First Line). Indications: constipation   ticagrelor (BRILINTA) 90 mg tablet Take one tablet by mouth twice daily. Indications: blood clot prevention following percutaneous coronary intervention       Review of Systems/Medical History        PONV Screening: Non-smoker      Airway - negative        Pulmonary       Not a current smoker        No indications/hx of asthma        Obstructive Sleep Apnea          Interventions: CPAP; compliant      Cardiovascular         Exercise tolerance: <4 METS       Beta Blocker therapy: Yes CIED          Manufacture: [employer]              Device Indication(s): SSS                              CIED interrogated last 12 months                        Battery life > 1 year                          Hypertension          Past MI non-STEMI, acute (< 6 months)    Coronary artery disease        PTCA (stents to RCA 03/18/23)            Dysrhythmias (s/p ablation); atrial fibrillation      CHF Acute and Systolic      Hyperlipidemia      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    Abdominal Findings:       Obese    Neurological Findings:  Alert and oriented x 3    Constitutional findings:       No acute distress       Previous Airway Procedure Notes Displaying the 3 most recent records   No records found.         Patient Lines/Drains/Airways Status       Active Lines:       Name Placement date Placement time Site Days    Indwelling Urinary Catheter 16 FR Standard 2-way 03/18/23  1459  -- 7    Peripheral IV Left Anterior;Proximal Forearm 20 G --  --  -- --                  Diagnostic Tests    TTE 03/18/23    Moderate reduced LV systolic function EF estimated 30-35%.  Wall motion abnormality described below.  Wall motion is suggestive of possible LAD territory injury/ischemia +/- multivessel disease.  No LV thrombus identified on the current study.  The RV does appear to be moderately to severely dilated.  Mild RV systolic dysfunction.    More focal distal free wall hypokinesis appreciated.    RA/RV pacer wire identified.  Mild tricuspid valve regurgitation.  Known bicuspid aortic valve.  Fusion of the left and right coronary cusp.  Visually leaflet excursion is limited.  No evidence of significant aortic valve stenosis.  Peak velocity 1.4 m/s, mean gradient 4 mmHg.  DVI 0.43.  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  The aortic root measures 3.7 cm.  No pericardial effusion.  CVP >15 mmHg.  PA systolic pressure estimated at 31 mmHg.  However PA systolic pressure could be underestimated given RV dysfunction.     Side-by-side comparison with the prior November 2024 study.  There has been interval decrease in LV systolic function previously estimate is preserved at 56% per biplane without wall motion abnormality.     Previously the right ventricle was described as being normal in size with preserved function.  Currently the RV appears to be severely dilated with mild RV systolic dysfunction.     The patient does have a known history of a bicuspid aortic valve without evidence of significant stenosis.  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  No interval/clinically significant interval change in dimensions seen.    Hematology:   Lab Results   Component Value Date    HGB 8.1 (L) 03/25/2023    HCT 24.0 (L) 03/25/2023    PLTCT 174 03/25/2023    WBC 10.10 03/25/2023    NEUT 89 (H) 03/25/2023    ANC 9.00 (H) 03/25/2023    LYMPH 19 (L) 03/22/2023    ALC 0.40 (L) 03/25/2023    MONA 5 03/25/2023    AMC 0.50 03/25/2023    EOSA 1 03/25/2023    ABC 0.00 03/25/2023    MCV 87.0 03/25/2023    MCH 29.3 03/25/2023    MCHC 33.6 03/25/2023    MPV 7.1 03/25/2023    RDW 19.0 (H) 03/25/2023         General Chemistry:   Lab Results   Component Value Date    NA 139 03/25/2023    K 4.6 03/25/2023    CL 113 (H) 03/25/2023 CO2 18 (L) 03/25/2023    GAP 8 03/25/2023    BUN 51 (H) 03/25/2023    CR 2.86 (H) 03/25/2023    GLU 126 (  H) 03/25/2023    CA 7.9 (L) 03/25/2023    ALBUMIN 2.8 (L) 03/25/2023    LACTIC 2.3 (H) 10/31/2018    OBSCA 1.25 03/18/2023    MG 2.0 03/25/2023    TOTBILI 0.4 03/25/2023    PO4 3.8 03/20/2023      Coagulation:   Lab Results   Component Value Date    PT 14.1 (H) 03/18/2023    PTT >200.0 (HH) 03/18/2023    INR 1.3 (H) 03/18/2023       PAC Plan    Anesthesia Plan    ASA score: 4   Plan: MAC  NPO status: acceptable      Informed Consent  Anesthetic plan and risks discussed with patient.        Plan discussed with: anesthesiologist, CRNA and surgeon/proceduralist.      Alerts: No        CIED

## 2023-03-25 NOTE — Case Management (ED)
 Case Management Progress Note    NAME:Paul Benjamin                          MRN: [id number]              DOB:12/03/1945          AGE: 77 y.o.  ADMISSION DATE: 03/18/2023             DAYS ADMITTED: LOS: 7 days      Today's Date: 03/25/2023    PLAN: Discharge to inpatient setting    Expected Discharge Date: 03/26/2023   Is Patient Medically Stable: Yes   Are there Barriers to Discharge? no    INTERVENTION/DISPOSITION:  Discharge Planning  SW met with pt and pt's family bedside.  They would like referrals to be sent to [facility] and [facility].    SW sent referrals.                     Transportation              Does the Patient Need Case Management to Arrange Discharge Transport? (ex: facility, ambulance, wheelchair/stretcher, Medicaid, cab, other): No  Will the Patient Use Family Transport?: Yes  Transportation Name, Phone and Availability #1: wife or daughter  Support                 Info or Referral                 Positive SDOH Domains and Potential Barriers                   Medication Needs                                    Estate manager/land agent                 Other                 Discharge Disposition                                                                  Selected Continued Care - Admitted Since 03/18/2023       [facility]       Service Provider Services Address Phone Fax Patient Preferred    [facility] & [facility] [address] [place] [address] [phone] --                      Glennon Hamilton, LMSW  Available on [facility]  Pager [phone]

## 2023-03-25 NOTE — Progress Notes
 HC5 END OF SHIFT/PLAN OF CARE NURSING NOTE   Nursing Shift: Night Shift 1900-0700    Acute events, nursing interventions, & communication with providers:  Pt NPO at MN for planned EGD in AM. No acute events.       Patient Goal(s)  Patient will Report progressive increase in activity tolerance by the end of next shift.        Patient will  Verbalize readiness for discharge by discharge.   Admission Weight: Weight: 95.3 kg (210 lb)    Last 3 Weights:   Vitals:    03/23/23 0640 03/24/23 0500 03/25/23 0500   Weight: 96.7 kg (213 lb 3 oz) 98.9 kg (218 lb 0.6 oz) 98 kg (216 lb 0.8 oz)     Weight Change: Weight trend increasing    Intake/Output Summary (Last 24 hours) at 03/25/2023 0659  Last data filed at 03/25/2023 4540  Gross per 24 hour   Intake 250 ml   Output 1275 ml   Net -1025 ml     Last Bowel Movement Date: 03/25/23    Fluid Restriction? No   Quality/Safety    Total Fall Risk Score: 14   Risk for Injury related to falls: Standard risk for injury based on the ABCS scoring tool  Fall Risk Category:   History of More Than One Fall Within 6 Months Before Admission: No  Elimination, Bowel and Urine: Incontinence OR urgency OR frequency  Interventions: Bed pan available in room  Medications: On 2 or more high fall risk drugs  Interventions: Use of gait belt , Stay within arm's reach during toileting/showering (i.e., dizziness, orthostasis) , and Educate patient on medication side effects  Patient Care Equipment: One present  Interventions: Needs assistance with patient care equipment when ambulating, Ensure environment is free of clutter and walkways are clear from tripping hazards, and Assess need for patient equipment and remove if not in use  Mobility: 2 - Assistance required, 2 - Unsteady gait  Interventions: Assist x2, Gait belt in use when ambulating, and Utilize walker, cane, or additional walking aid for ambulation  Cognition: 0 - No cognition issues  Interventions: N/A - Does not score as risk in this category    Other safety precautions in place: HAPI Prevention Bundle and CAUTI Prevention Bundle    Restraints:  No      Patient Education  This RN provided education to Patient today. The following education topics were reviewed:  Quality/Safety Education:   CAUTI prevention, Fall risk, Pain scale, Pressure injury prevention/care, and VTE prophylaxis  Medication Education:   Medication management (Indication, adverse effects, monitoring, etc)  Education provided on the following medication(s): IV Protonix   Cardiac - Specific Education:   Heart failure management and Cardiac diet/nutrition  General Education:   Bowel/Urinary elimination, Diet/nutrition, Mobility/Activity intolerance, and Tests/Procedures: NPO for planned EGD in AM    The following teaching method(s) were used: Verbal  Response to learning: [employer] Understanding  Needs reinforcement on: N/A

## 2023-03-25 NOTE — Case Management (ED)
 Case Management Progress Note    NAME:Paul Benjamin                          MRN: [id number]              DOB:February 20, 1946          AGE: 77 y.o.  ADMISSION DATE: 03/18/2023             DAYS ADMITTED: LOS: 7 days      Today's Date: 03/25/2023    PLAN: Discharge plan ongoing, recs are inpatient    Expected Discharge Date: 03/26/2023   Is Patient Medically Stable: Yes   Are there Barriers to Discharge? yes (facility acceptance and insurance auth)    INTERVENTION/DISPOSITION:  Discharge Planning               SW tasked CMA to deliver a SNF list bedside.  SW will meet with pt to identify where they would like referrals to be sent.  Transportation              Does the Patient Need Case Management to Arrange Discharge Transport? (ex: facility, ambulance, wheelchair/stretcher, Medicaid, cab, other): No  Will the Patient Use Family Transport?: Yes  Transportation Name, Phone and Availability #1: wife or daughter  Support                 Info or Referral                 Positive SDOH Domains and Potential Barriers                   Medication Needs                                               Estate manager/land agent                 Other                 Discharge Disposition                                                                  Selected Continued Care - Admitted Since 03/18/2023       [facility]       Service Provider Services Address Phone Fax Patient Preferred    [facility] & [facility] [address] [place] [address] [phone] --                      Glennon Hamilton, LMSW  Available on [facility]  Pager [phone]

## 2023-03-25 NOTE — Progress Notes
 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 man with a past medical history of AD polycystic kidney disease, HFpEF, CKD Stage IV, T2DM, anemia of chronic disease, history of complicated UTI with urinary retention and bilateral pyelonephritis, urge incontinence, chronic indwelling foley catheter with plans to transition to suprapubic in February of 2025, recent history of lower extremity cellulitis, historically low blood pressure with transition from Coreg to metoprolol, SSS s/p dual chamber PPM (2008), afib s/p ablation (2009), HTN, CAD s/p stenting x2 to LAD and proximal portion of diagonal branch (2005), dyslipidemia, and OSA compliant with CPAP who presented to  as a transfer from [facility] and was found to be in septic shock most likely from the GU tract with increasing troponin and reported chest pain. He was taken to cath lab on 12/23 with stenting x2 to the proximal RCA. He was then transferred to the CICU for monitoring s/p PCI.      Urine culture positive for pan sensitive Serratia marcescens. Antibiotics de-escalated to Rocephin on 12/25. GDMT limited due to advanced CKD. Initiation of BB on 12/25 as the patient's BP stable off of pressor support. Hospital course complicated by melena and epistaxis, Hg remaining stable. EGD on 12/30 without evidence of bleeding. PPI switched to oral BID. Rocephin (12/24 - 12/30), completed a seven day course.    Interval Updates:   > EGD done today.  > Continue PPI   > Last day of ceftriaxone today  > Continue to hold PTA Lasix     #Urosepsis   #History of complicated UTIs   #Intertrigo complicated by candidal infection  - Recent history of UTI treated with Augmentin transitioned to Keflex in early December, bilateral pyelonephritis treated with Zosyn transitioned to Augmentin early November, and doxycycline 100 mg BID for cellulitis 11/5 - 11/14  - Admission UA showing packed WBCs, 20-50 RBCs, moderate bacteria but was nitrite negative. 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   - Admission CXR without signs of consolidation  - 03/18/23 Blood Cx: NGTD  - 03/18/2023 Urine Cx: Serratia marcescens   - Antibiotics:               Meropenem (12/23 single dose at outside hospital)               Cefepime (12/23 - 12/25)  Rocephin (12/24 - 12/30)   Plan:  > Continue clotrimazole topical cream BID for candidal intertrigo  > Wound team following  > Rocephin (12/24 - 12/30), to completed a seven day course  > ID following, appreciate recs     #NSTEMI s/p PCI with stenting x2 to proximal RCA (03/18/23)   #CAD s/p PCI to LAD and proximal portion of diagonal branch (2005)  #HFpEF  #SSS s/p dual chamber PPM (2008)  #Afib s/p ablation (2009)  #Mixed dyslipidemia  - Pressors: levophed titrated to goal MAP > 65   - Admission EKG: Sinus rhythm with first degree AV block and left axis deviation  - Post cath EKG: Sinus rhythm with first degree AV block and left axis deviation  - Admission Echo: moderately reduced LV systolic function with EF 30-35% (previously 56% 01/2023); new regional wall motion abnormalities (akinesis of inferoseptal and inferolateral walls and apical segments; hypokinesis of basal inferoseptal, basal inferior, mid anteroseptal, and mid inferior walls); moderately to severely dilated RV with mild RV systolic dysfunction; known bicuspid aortic valve  - Trop: 3 (OSH); 12,690 (admission); 14,792 (4 hours post admission)  - 12/23 PCI with stenting x 2 to proximal RCA        Lab Results   Component Value Date     CHOL 76 03/19/2023     TRIG 123 03/19/2023     HDL 26 (L) 03/19/2023     LDL 38 03/19/2023     VLDL 24.6 03/19/2023     NONHDLCHOL 50 03/19/2023     CHOLHDLC 4 06/05/2021      Guideline Directed Medical Therapy PTA Inpatient Changes   ACEI/ARB No  Renal dysfunction     ARNI No Renal dysfunction     BB On Toprol 25 mg daily  Continue PTA Toprol   SGLT-2 Inhibitor No UTIs, current or frequent     Mineralocorticoid Receptor Antagonist No Renal dysfunction     Hydralazine/Nitrate No Hypotension     Ivabradine No; Hypotension     Heart Rhythm Management Therapy Yes (PPM)     Anticoagulation None with history of Afib/flutter  DAPT s/p PCI    Cardiac Rehab Evaluation for LVEF < or equal to 40% Yes; Date ordered: 12/23     7-Day post hospital follow up scheduled within 24-48 hours of discharge No Discharge not expected within the next 24-48 hours        - PTA meds: Atorvastatin 40mg  qHS, furosemide 40mg  qAM, metoprolol succinate 12.5mg  daily, and sublingual nitro  - Device interrogation: 9.1% RV paced (programmed MVP-->RV pacing likely r/t intermittent second degree AVB). Cardiac compass shows fairly significant increase in RV pacing over the last 1-2days  - HgA1c: 5.8%   Plan:  > Continue increased dose of atorvastatin 80 mg qHS  > Continue to hold PTA furosemide   > Tentative plan for repeat TTE in the outpatient setting to reassess LV function  > Continue PTA metoprolol succinate   > Will not start ACEi/ARB and spironolactone due to advanced CKD. Patient would not be a good candidate for SGLT2 inhibitor due to chronic indwelling catheter with history of complicated UTIs  > Discontinued hydralazine and isosorbide mononitrate as BP has not tolerated   > Continue aspirin 81mg  daily and ticagrelor 90mg  BID  > Continue monitoring patient on telemetry. Replace lytes prn, K > 4, Mg > 2     #Suspected melena   #Acute on chronic normocytic normochromic anemia   #Hx anemia of chronic disease  -  Baseline hemoglobin 9.5-10.5  03/19/2023: Iron 27 ?g/dL (L)  45/40/9811: Iron Binding-TIBC 167 mcg/dL (L)  91/47/8295: % Saturation 16 % (L)  03/19/2023: Ferritin 554 ng/mL (H)   - Admission hemoglobin 9.8  - EGD 12/30: Duodenitis could have been source of melena however no active bleeding or stigmata of  recent bleed. 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 Mucosal nodule found in the esophagus. Biopsied. Erythematous duodenopathy.   Plan:  > Transfuse if Hgb <7 or <8 with active bleeding  > DVT Ppx: holding   > Continue PPI BID (12/26 - ), now oral  > GI following, appreciate assistance   > Consider IV iron in outpatient setting     #Epistaxis, resolved  > Consulted ENT, appreciate recs   > Continue saline nasal spray q6hr, vaseline BID, and humidified air w/ CPAP    #AD Polycystic Kidney Disease  #CKD Stage IV  - Etiology of CKD most likely combination of T2DM (well controlled), ADPKD, and HFpEF   - Recent admission with AKI due to urinary retention and bilateral pyelonephritis  - Baseline creatinine 2.5-3.0   - Indwelling foley replaced 03/18/23. Plans to get suprapubic catheter placed in February 2025  - Follows with Dr. Philomena Course in the outpatient setting   Plan:  > Replace electrolytes PRN   > Continue to monitor kidney function      #Chronic indwelling foley catheter   #Overactive bladder/Urge Incontinence   #Hx urinary retention   - Patient has a chronic indwelling catheter for urge incontinence/OAB and urinary retention  - Follows with urology in the outpatient setting   Plan:   > Appreciate urology recs     #Gout  #Venous stasis changes bilateral lower extremities   Plan:  > Continue renal dosed allopurinol 100mg  daily    #DMT2 with long-term insulin use  #Hypothyroidism  - 03/19/23 A1c 5.8  - 03/19/23: TSH 1.94  - PTA Medications: Levothyroxine qAM, Ozempic 1mg  weekly (last dose on 12/20), and aspart LDCF   Plan:  > POC blood glucose monitoring ordered ACHS with LDCF  > Continue PTA levothyroxine qAM  > holding PTA Ozempic    #OSA, compliant with CPAP  - Admission ABG: 7.38/30/189  - Admission CXR: No consolidation, pleural effusion, or pneumothorax  Plan:  > Continue home CPAP     #Restless Leg Syndrome  - PTA Medications: Pramipexole 1.5mg  qHS and gabapentin 100mg  qHS  Plan:  > Continue renal dosed pramipexole 0.75mg  qHS and gabapentin 100mg  qHS    Nutrition: No Dietitian Consult  Wound: Wound Documentation Wound:      Wounds Moisture associated skin damage Anterior;Right;Left;Medial Pelvis (Active)   03/18/23 1500   Wound Type: Moisture associated skin damage   Orientation: Anterior;Right;Left;Medial   Location: Pelvis   Wound Location Comments:    Initial Wound Site Closure:    Initial Dressing Placed:    Initial Cycle:    Initial Suction Setting (mmHg):    Pressure Injury Stages:    Pressure Injury Present Within 24 Hours of Hospital Admission:    If This Pressure Injury Is Suspected to Be Device Related, Please Select the Device::    Is the Wound Open or Closed:    Wound Assessment Moist;Pink 03/25/23 0342   Peri-wound Assessment Pink;Red 03/25/23 0342   Wound Drainage Amount None 03/25/23 0342   Wound Dressing Status None/open to air 03/25/23 0342   Wound Care Treatment or ointment applied 03/24/23 2153  Wound Dressing and/or Treatment Pharmaceutical treatment (see MAR) 03/25/23 0342   Number of days: 7       Wounds Pressure injury Sacrum (Active)   03/18/23 1500   Wound Type: Pressure injury Orientation:    Location: Sacrum   Wound Location Comments:    Initial Wound Site Closure:    Initial Dressing Placed:    Initial Cycle:    Initial Suction Setting (mmHg):    Pressure Injury Stages: Stage 1   Pressure Injury Present Within 24 Hours of Hospital Admission:    If This Pressure Injury Is Suspected to Be Device Related, Please Select the Device::    Is the Wound Open or Closed:    Wound Assessment Pink;Red 03/25/23 0342   Peri-wound Assessment Intact 03/25/23 0342   Wound Drainage Amount None 03/24/23 2220   Wound Dressing Status Intact 03/25/23 0342   Wound Care Dressing changed or new application 03/24/23 2220   Wound Dressing and/or Treatment Foam 03/25/23 0342   Number of days: 7                   Checklist:  FEN: no fluids, replace lytes prn, cardiac diet    LDA: PIV x 1 and chronic indwelling foley catheter   VTE PPx: holding   Code status: Full Code   Dispo: Continue admit to cardiology     Candelaria Celeste, MD  Internal Medicine, PGY-1  Available on [employer]    Patient seen and discussed with Dr. Sandria Manly     ________________________________________________________________________    Interval History: No acute events overnight.  Explained that endoscopy did not find any specific signs of bleeding.  Will start planning for discharge to an inpatient setting.    Review of Systems: 10 point comprehensive ROS is negative, except for:  as stated above     Medications:  Scheduled Meds:allopurinoL (ZYLOPRIM) tablet 100 mg, 100 mg, Oral, QDAY  aspirin chewable tablet 81 mg, 81 mg, Oral, QDAY  atorvastatin (LIPITOR) tablet 80 mg, 80 mg, Oral, QHS  cefTRIAXone (ROCEPHIN) IVP 1 g, 1 g, Intravenous, Q24H*  clotrimazole (LOTRIMIN) 1 % topical cream, , Topical, BID  gabapentin (NEURONTIN) capsule 100 mg, 100 mg, Oral, QHS  [Held by Provider] heparin (porcine) PF syringe 5,000 Units, 5,000 Units, Subcutaneous, Q8H  ketoconazole (NIZORAL) 2 % topical cream, , Topical, BID  levothyroxine (SYNTHROID) tablet 75 mcg, 75 mcg, Oral, QDAY  magnesium oxide (MAGOX) tablet 400 mg, 400 mg, Oral, ONCE  metoprolol succinate XL (TOPROL XL) tablet 25 mg, 25 mg, Oral, QDAY  pantoprazole (PROTONIX) injection 40 mg, 40 mg, Intravenous, GNF(62-13)  polyethylene glycol 3350 (MIRALAX) packet 17 g, 1 packet, Oral, QDAY  pramipexole (MIRAPEX) tablet 0.75 mg, 0.75 mg, Oral, QHS  sennosides-docusate sodium (SENOKOT-S) tablet 1 tablet, 1 tablet, Oral, BID  sodium chloride (SEA MIST) 0.65 % nasal spray 2 spray, 2 spray, Each Nostril, QID  ticagrelor (BRILINTA) tablet 90 mg, 90 mg, Oral, BID    Continuous Infusions:  PRN and Respiratory Meds:acetaminophen Q4H PRN, ondansetron (ZOFRAN) IV Q6H PRN      Objective:    Physical Exam:  Vital Signs: Last Filed Vital Signs: 24 Hour Range   BP: 108/59 (12/30 0309)  Temp: 37.2 ?C (99 ?F) (12/30 0309)  Pulse: 96 (12/30 0348)  Respirations: 16 PER MINUTE (12/30 0309)  SpO2: 98 % (12/30 0309)  O2%: 21 % (12/29 2130)  O2 Device: CPAP/BiPAP (12/30 0309) BP: (88-118)/(47-59)   Temp:  [36.2 ?C (97.1 ?F)-37.2 ?C (  99 ?F)]   Pulse:  [75-100]   Respirations:  [14 PER MINUTE-18 PER MINUTE]   SpO2:  [93 %-100 %]   O2%:  [21 %]   O2 Device: CPAP/BiPAP     Physical Exam  Constitutional:       Appearance: He is not toxic-appearing.   HENT:      Head: Normocephalic and atraumatic.      Nose: Nose normal.      Mouth/Throat:      Mouth: Mucous membranes are moist.   Eyes:      General: No scleral icterus.     Conjunctiva/sclera: Conjunctivae normal.   Cardiovascular:      Rate and Rhythm: Normal rate and regular rhythm.      Heart sounds: Normal heart sounds.      Comments: +1 LE edema  Pulmonary:      Effort: Pulmonary effort is normal.      Breath sounds: Normal breath sounds. No stridor. No wheezing.   Abdominal:      General: Abdomen is flat. Bowel sounds are normal.      Palpations: Abdomen is soft.      Tenderness: There is no abdominal tenderness.   Skin:     General: Skin is warm and dry.   Neurological:      General: No focal deficit present.      Mental Status: He is alert.   Psychiatric:         Mood and Affect: Mood normal.         Thought Content: Thought content normal.         Judgment: Judgment normal.          Intake/Output Summary (last 24 hr):    Intake/Output Summary (Last 24 hours) at 03/25/2023 0640  Last data filed at 03/25/2023 4742  Gross per 24 hour   Intake 250 ml   Output 1275 ml   Net -1025 ml         Last bowel movement: 03/25/23      Lab / Radiology / Other Diagnostic Tests:  Pertinent labs and radiology reviewed.

## 2023-03-26 ENCOUNTER — Encounter: Admit: 2023-03-26 | Discharge: 2023-03-26 | Payer: MEDICARE

## 2023-03-26 ENCOUNTER — Inpatient Hospital Stay
Admit: 2023-03-18 | Discharge: 2023-03-26 | Disposition: A | Discharge status: Skilled Nursing Facility | Payer: MEDICARE | Source: Other Acute Inpatient Hospital

## 2023-03-26 VITALS — BP 92/44 | HR 74 | Temp 97.70000°F

## 2023-03-26 VITALS — HR 76

## 2023-03-26 VITALS — BP 112/60 | HR 77 | Temp 97.70000°F

## 2023-03-26 VITALS — BP 104/48

## 2023-03-26 VITALS — BP 96/48

## 2023-03-26 VITALS — Wt 216.1 lb

## 2023-03-26 VITALS — BP 108/56

## 2023-03-26 DIAGNOSIS — E039 Hypothyroidism, unspecified: Secondary | ICD-10-CM

## 2023-03-26 DIAGNOSIS — Z888 Allergy status to other drugs, medicaments and biological substances status: Secondary | ICD-10-CM

## 2023-03-26 DIAGNOSIS — G4733 Obstructive sleep apnea (adult) (pediatric): Secondary | ICD-10-CM

## 2023-03-26 DIAGNOSIS — I214 Non-ST elevation (NSTEMI) myocardial infarction: Secondary | ICD-10-CM

## 2023-03-26 DIAGNOSIS — Z6830 Body mass index (BMI) 30.0-30.9, adult: Secondary | ICD-10-CM

## 2023-03-26 DIAGNOSIS — I361 Nonrheumatic tricuspid (valve) insufficiency: Secondary | ICD-10-CM

## 2023-03-26 DIAGNOSIS — E1122 Type 2 diabetes mellitus with diabetic chronic kidney disease: Secondary | ICD-10-CM

## 2023-03-26 DIAGNOSIS — I130 Hypertensive heart and chronic kidney disease with heart failure and stage 1 through stage 4 chronic kidney disease, or unspecified chronic kidney disease: Secondary | ICD-10-CM

## 2023-03-26 DIAGNOSIS — Q2381 Bicuspid aortic valve: Secondary | ICD-10-CM

## 2023-03-26 DIAGNOSIS — K2270 Barrett's esophagus without dysplasia: Secondary | ICD-10-CM

## 2023-03-26 DIAGNOSIS — E669 Obesity, unspecified: Secondary | ICD-10-CM

## 2023-03-26 DIAGNOSIS — I495 Sick sinus syndrome: Secondary | ICD-10-CM

## 2023-03-26 DIAGNOSIS — Z794 Long term (current) use of insulin: Secondary | ICD-10-CM

## 2023-03-26 DIAGNOSIS — Z882 Allergy status to sulfonamides status: Secondary | ICD-10-CM

## 2023-03-26 DIAGNOSIS — Z950 Presence of cardiac pacemaker: Secondary | ICD-10-CM

## 2023-03-26 DIAGNOSIS — Z881 Allergy status to other antibiotic agents status: Secondary | ICD-10-CM

## 2023-03-26 DIAGNOSIS — K3189 Other diseases of stomach and duodenum: Secondary | ICD-10-CM

## 2023-03-26 DIAGNOSIS — N3941 Urge incontinence: Secondary | ICD-10-CM

## 2023-03-26 DIAGNOSIS — Z955 Presence of coronary angioplasty implant and graft: Secondary | ICD-10-CM

## 2023-03-26 DIAGNOSIS — Z79890 Hormone replacement therapy (postmenopausal): Secondary | ICD-10-CM

## 2023-03-26 DIAGNOSIS — Z9049 Acquired absence of other specified parts of digestive tract: Secondary | ICD-10-CM

## 2023-03-26 DIAGNOSIS — G2581 Restless legs syndrome: Secondary | ICD-10-CM

## 2023-03-26 DIAGNOSIS — Z7985 Long-term (current) use of injectable non-insulin antidiabetic drugs: Secondary | ICD-10-CM

## 2023-03-26 DIAGNOSIS — Z87891 Personal history of nicotine dependence: Secondary | ICD-10-CM

## 2023-03-26 DIAGNOSIS — E559 Vitamin D deficiency, unspecified: Secondary | ICD-10-CM

## 2023-03-26 DIAGNOSIS — K2981 Duodenitis with bleeding: Secondary | ICD-10-CM

## 2023-03-26 DIAGNOSIS — Z8701 Personal history of pneumonia (recurrent): Secondary | ICD-10-CM

## 2023-03-26 DIAGNOSIS — Z87440 Personal history of urinary (tract) infections: Secondary | ICD-10-CM

## 2023-03-26 DIAGNOSIS — B9689 Other specified bacterial agents as the cause of diseases classified elsewhere: Secondary | ICD-10-CM

## 2023-03-26 DIAGNOSIS — E782 Mixed hyperlipidemia: Secondary | ICD-10-CM

## 2023-03-26 DIAGNOSIS — I872 Venous insufficiency (chronic) (peripheral): Secondary | ICD-10-CM

## 2023-03-26 DIAGNOSIS — Q992 Fragile X chromosome: Secondary | ICD-10-CM

## 2023-03-26 DIAGNOSIS — N3281 Overactive bladder: Secondary | ICD-10-CM

## 2023-03-26 DIAGNOSIS — Z825 Family history of asthma and other chronic lower respiratory diseases: Secondary | ICD-10-CM

## 2023-03-26 DIAGNOSIS — N184 Chronic kidney disease, stage 4 (severe): Secondary | ICD-10-CM

## 2023-03-26 DIAGNOSIS — N320 Bladder-neck obstruction: Secondary | ICD-10-CM

## 2023-03-26 DIAGNOSIS — Z79899 Other long term (current) drug therapy: Secondary | ICD-10-CM

## 2023-03-26 DIAGNOSIS — I878 Other specified disorders of veins: Secondary | ICD-10-CM

## 2023-03-26 DIAGNOSIS — Z8616 Personal history of COVID-19: Secondary | ICD-10-CM

## 2023-03-26 DIAGNOSIS — Z8249 Family history of ischemic heart disease and other diseases of the circulatory system: Secondary | ICD-10-CM

## 2023-03-26 DIAGNOSIS — R6521 Severe sepsis with septic shock: Secondary | ICD-10-CM

## 2023-03-26 DIAGNOSIS — R570 Cardiogenic shock: Secondary | ICD-10-CM

## 2023-03-26 DIAGNOSIS — R339 Retention of urine, unspecified: Secondary | ICD-10-CM

## 2023-03-26 DIAGNOSIS — L89151 Pressure ulcer of sacral region, stage 1: Secondary | ICD-10-CM

## 2023-03-26 DIAGNOSIS — Q612 Polycystic kidney, adult type: Secondary | ICD-10-CM

## 2023-03-26 DIAGNOSIS — J9601 Acute respiratory failure with hypoxia: Secondary | ICD-10-CM

## 2023-03-26 DIAGNOSIS — R54 Age-related physical debility: Secondary | ICD-10-CM

## 2023-03-26 DIAGNOSIS — I440 Atrioventricular block, first degree: Secondary | ICD-10-CM

## 2023-03-26 DIAGNOSIS — B372 Candidiasis of skin and nail: Secondary | ICD-10-CM

## 2023-03-26 DIAGNOSIS — R040 Epistaxis: Secondary | ICD-10-CM

## 2023-03-26 DIAGNOSIS — T83511A Infection and inflammatory reaction due to indwelling urethral catheter, initial encounter: Secondary | ICD-10-CM

## 2023-03-26 DIAGNOSIS — E860 Dehydration: Secondary | ICD-10-CM

## 2023-03-26 DIAGNOSIS — I2510 Atherosclerotic heart disease of native coronary artery without angina pectoris: Secondary | ICD-10-CM

## 2023-03-26 DIAGNOSIS — A419 Sepsis, unspecified organism: Secondary | ICD-10-CM

## 2023-03-26 DIAGNOSIS — Z7902 Long term (current) use of antithrombotics/antiplatelets: Secondary | ICD-10-CM

## 2023-03-26 DIAGNOSIS — L304 Erythema intertrigo: Secondary | ICD-10-CM

## 2023-03-26 DIAGNOSIS — I5022 Chronic systolic (congestive) heart failure: Secondary | ICD-10-CM

## 2023-03-26 DIAGNOSIS — I255 Ischemic cardiomyopathy: Secondary | ICD-10-CM

## 2023-03-26 DIAGNOSIS — E872 Acidosis, unspecified: Secondary | ICD-10-CM

## 2023-03-26 DIAGNOSIS — D631 Anemia in chronic kidney disease: Secondary | ICD-10-CM

## 2023-03-26 DIAGNOSIS — M109 Gout, unspecified: Secondary | ICD-10-CM

## 2023-03-26 LAB — SURGICAL PATHOLOGY

## 2023-03-26 LAB — CBC AND DIFF
~~LOC~~ BKR ABSOLUTE BASO COUNT: 0 10*3/uL — ABNORMAL HIGH (ref 0.00–0.20)
~~LOC~~ BKR ABSOLUTE EOS COUNT: 0.2 10*3/uL — ABNORMAL LOW (ref 0.00–0.45)
~~LOC~~ BKR ABSOLUTE LYMPH COUNT: 0.4 10*3/uL — ABNORMAL LOW (ref 1.00–4.80)
~~LOC~~ BKR ABSOLUTE MONO COUNT: 0.6 10*3/uL — ABNORMAL LOW (ref 0.00–0.80)

## 2023-03-26 LAB — COMPREHENSIVE METABOLIC PANEL
~~LOC~~ BKR ALK PHOSPHATASE: 80 U/L — ABNORMAL LOW (ref 25–110)
~~LOC~~ BKR ALT: 22 U/L — ABNORMAL LOW (ref 7–56)
~~LOC~~ BKR ANION GAP: 9 10*3/uL — ABNORMAL HIGH (ref 3–12)
~~LOC~~ BKR AST: 15 U/L — ABNORMAL HIGH (ref 7–40)
~~LOC~~ BKR CO2: 17 mmol/L — ABNORMAL LOW (ref 21–30)

## 2023-03-26 LAB — POC GLUCOSE
~~LOC~~ BKR POC GLUCOSE: 165 mg/dL — ABNORMAL HIGH (ref 70–100)
~~LOC~~ BKR POC GLUCOSE: 187 mg/dL — ABNORMAL HIGH (ref 70–100)

## 2023-03-26 MED ORDER — CLOTRIMAZOLE 1 % TP CREA
Freq: Two times a day (BID) | TOPICAL | 3 refills | Status: DC
Start: 2023-03-26 — End: 2023-03-26

## 2023-03-26 MED ORDER — LACTATED RINGERS IV SOLP
500 mL | Freq: Once | INTRAVENOUS | 0 refills | Status: CP
Start: 2023-03-26 — End: ?
  Administered 2023-03-26: 18:00:00 500 mL via INTRAVENOUS

## 2023-03-26 MED ORDER — SODIUM CHLORIDE 0.65 % NA SPRA
2 | NASAL | 0 refills | 30.00000 days | Status: AC | PRN
Start: 2023-03-26 — End: ?

## 2023-03-26 MED ORDER — CLOTRIMAZOLE 1 % TP CREA
Freq: Two times a day (BID) | TOPICAL | 0 refills | Status: AC
Start: 2023-03-26 — End: ?

## 2023-03-26 MED ORDER — ATORVASTATIN 80 MG PO TAB
80 mg | ORAL_TABLET | Freq: Every evening | ORAL | 3 refills | Status: DC
Start: 2023-03-26 — End: 2023-03-26

## 2023-03-26 MED ORDER — ALLOPURINOL 100 MG PO TAB
100 mg | Freq: Every day | ORAL | 0 refills | 45.00000 days | Status: AC
Start: 2023-03-26 — End: ?

## 2023-03-26 MED ORDER — ALLOPURINOL 100 MG PO TAB
100 mg | ORAL_TABLET | Freq: Every day | ORAL | 0 refills | 45.00000 days | Status: DC
Start: 2023-03-26 — End: 2023-03-26

## 2023-03-26 MED ORDER — ATORVASTATIN 40 MG PO TAB
40 mg | Freq: Every evening | ORAL | 0 refills | 90.00000 days | Status: DC
Start: 2023-03-26 — End: 2023-05-22

## 2023-03-26 MED ORDER — PRAMIPEXOLE 0.75 MG PO TAB
.75 mg | ORAL_TABLET | Freq: Every evening | ORAL | 3 refills | 45.00000 days | Status: DC
Start: 2023-03-26 — End: 2023-03-26

## 2023-03-26 MED ORDER — FUROSEMIDE 20 MG PO TAB
40 mg | Freq: Every day | ORAL | 0 refills | 90.00000 days | Status: DC | PRN
Start: 2023-03-26 — End: 2023-04-17

## 2023-03-26 MED ORDER — METOPROLOL SUCCINATE 25 MG PO TB24
25 mg | ORAL_TABLET | Freq: Every day | ORAL | 3 refills | 90.00000 days | Status: DC
Start: 2023-03-26 — End: 2023-03-26

## 2023-03-26 MED ORDER — KETOCONAZOLE 2 % TP CREA
Freq: Two times a day (BID) | TOPICAL | 0 refills | 30.00000 days | Status: AC
Start: 2023-03-26 — End: ?

## 2023-03-26 MED ORDER — TICAGRELOR 90 MG PO TAB
90 mg | Freq: Two times a day (BID) | ORAL | 0 refills | 30.00000 days | Status: DC
Start: 2023-03-26 — End: 2023-07-02

## 2023-03-26 MED ORDER — PRAMIPEXOLE 0.75 MG PO TAB
.75 mg | Freq: Every evening | ORAL | 0 refills | 45.00000 days | Status: AC
Start: 2023-03-26 — End: ?

## 2023-03-26 MED ORDER — PANTOPRAZOLE 40 MG PO TBEC
40 mg | Freq: Two times a day (BID) | ORAL | 0 refills | 90.00000 days | Status: DC
Start: 2023-03-26 — End: 2023-05-03

## 2023-03-26 MED ORDER — METOPROLOL SUCCINATE 25 MG PO TB24
25 mg | Freq: Every day | ORAL | 0 refills | 90.00000 days | Status: DC
Start: 2023-03-26 — End: 2023-08-21

## 2023-03-26 MED ORDER — KETOCONAZOLE 2 % TP CREA
Freq: Two times a day (BID) | TOPICAL | 3 refills | 30.00000 days | Status: DC
Start: 2023-03-26 — End: 2023-03-26

## 2023-03-26 MED ORDER — PANTOPRAZOLE 40 MG PO TBEC
40 mg | ORAL_TABLET | Freq: Two times a day (BID) | ORAL | 0 refills | 90.00000 days | Status: DC
Start: 2023-03-26 — End: 2023-03-26

## 2023-03-26 MED ORDER — ASPIRIN 81 MG PO CHEW
81 mg | ORAL_TABLET | Freq: Every day | ORAL | 3 refills | Status: DC
Start: 2023-03-26 — End: 2023-03-26

## 2023-03-26 MED ORDER — ASPIRIN 81 MG PO CHEW
81 mg | Freq: Every day | ORAL | 0 refills | Status: AC
Start: 2023-03-26 — End: ?

## 2023-03-26 NOTE — Progress Notes
@   approx 1315 report called to [employer] @ [facility] and all questions answered. IV and tele removed prior to discharge without any issues. Pt got 1/2 of his ordered LR bolus BP improved after bolus. Pt transported per wheelchair Zenaida Niece by Production manager. Blima Rich RN

## 2023-03-26 NOTE — Progress Notes
 Discharge paperwork gone over with wife and daughter. They have no questions about plan.

## 2023-03-26 NOTE — Case Management (ED)
 Case Management Progress Note    NAME:Paul Benjamin                          MRN: [id number]              DOB:09-25-1945          AGE: 77 y.o.  ADMISSION DATE: 03/18/2023             DAYS ADMITTED: LOS: 8 days      Today's Date: 03/26/2023    PLAN: Discharge to [facility]    Expected Discharge Date: 03/26/2023   Is Patient Medically Stable: Yes   Are there Barriers to Discharge? no    INTERVENTION/DISPOSITION:  Discharge Planning  SW messaged [facility] about pt discharging today.  They can accept pt and pickup at 11.  SW messaged team to see if ok.  Yes.    SW tasked CMA to deliver transfer packet to pt bedside and fax dc paperwork when available.     SW will let pt know bedside.                 Transportation              Does the Patient Need Case Management to Arrange Discharge Transport? (ex: facility, ambulance, wheelchair/stretcher, Medicaid, cab, other): No  Will the Patient Use Family Transport?: Yes  Transportation Name, Phone and Availability #1: wife or daughter  Support                 Info or Referral                 Positive SDOH Domains and Potential Barriers                   Medication Needs                                Estate manager/land agent                 Other                 Discharge Disposition                                                   Selected Continued Care - Admitted Since 03/18/2023       [facility]       Service Provider Services Address Phone Fax Patient Preferred    [facility] [address] [address] [phone] --                      Glennon Hamilton, LMSW  Available on [facility]  Pager [phone]

## 2023-03-26 NOTE — Discharge Instructions - Pharmacy
 Discharge Summary      Name: Paul Benjamin  Medical Record Number: [id number]        Account Number:  [id number]  Date Of Birth:  December 29, 1945                         Age:  77 y.o.  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, <= 40%)   (HCC)    CKD stage 4 due to type 2 diabetes mellitus (HCC)    Venous stasis dermatitis of both lower extremities    Overactive bladder (OAB)    Hx of heart artery stent    Chronic indwelling Foley catheter    NSTEMI (non-ST elevated myocardial infarction) (HCC)    Complicated urinary tract infection    Lower extremity cellulitis    Ischemic cardiomyopathy    Anemia    Epistaxis    Hypotension: this, along with his renal impairment, has limited any GDMT   for HF aside from low dose beta blockade     Present on Admission:   Septic shock (HCC)   CAD (coronary artery disease)   Overactive bladder (OAB)   CKD stage 4 due to type 2 diabetes mellitus (HCC)   Mixed dyslipidemia   Chronic indwelling Foley catheter   NSTEMI (non-ST elevated myocardial infarction) (HCC)   Complicated urinary tract infection   Lower extremity cellulitis   Ischemic cardiomyopathy   Venous stasis dermatitis of both lower extremities   Presence of permanent cardiac pacemaker   Sleep apnea   Hx of heart artery stent   Anemia   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, <= 40%) (HCC)        Significant Past Medical History        Atrial fibrillation (HCC)  CAD (coronary artery disease)  CHF (congestive heart failure) (HCC)  Coronary artery disease (CAD)      Comment:  s/p PCI LAD  HX: anticoagulation      Comment:  Baby aspirin  Hypercholesterolemia  Hypertension  Hypertriglyceridemia  Kidney disease  Obesity  Sleep apnea  SSS (sick sinus syndrome) (HCC)      Comment:  dual chamber PPM  / [employer]  Type II diabetes mellitus (HCC)    Allergies   Ciprofloxacin, Lisinopril, Cefuroxime, Clindamycin, Plavix [clopidogrel], Sulfa (sulfonamide antibiotics), and Quinine    Brief Hospital Course   The patient was admitted and the following issues were addressed during this hospitalization: (with pertinent details including admission exam/imaging/labs).        Paul Benjamin is a 77 y.o. man with a past medical history of AD polycystic kidney disease, HFpEF, CKD Stage IV, T2DM, anemia of chronic disease, history of complicated UTI with urinary  retention and bilateral pyelonephritis, urge incontinence, chronic indwelling foley catheter with plans to transition to suprapubic in February of 2025, recent history of lower extremity cellulitis, historically low blood pressure with transition from Coreg to metoprolol, SSS s/p dual chamber PPM (2008), afib s/p ablation (2009), HTN, CAD s/p stenting x2 to LAD and proximal portion of diagonal branch (2005), dyslipidemia, and OSA compliant with CPAP who presented to [place] as a transfer from [facility] and was found to be in septic shock, requiring pressors for a few drinks, most likely from the GU tract with increasing troponin and reported chest pain. He was taken to cath lab on 12/23 with stenting x2 to the proximal RCA. He was then transferred to the CICU for monitoring s/p PCI. Urine culture positive for pan sensitive Serratia marcescens. Antibiotics de-escalated to Rocephin on 12/25. GDMT limited due to advanced CKD. Initiation of BB on 12/25 as the patient's BP stable off of pressor support. Hospital course complicated by melena and epistaxis. EGD on 12/30 without evidence of bleeding. PPI switched to oral BID. Rocephin (12/24 - 12/30), completed a seven day course.  At discharge hemoglobin 8.5.  Patient stable with mild dehydration.  500 LR before discharge and holding home Lasix.      - Admission UA showing packed WBCs, 20-50 RBCs, moderate bacteria but was nitrite negative. 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   - Admission CXR without signs of consolidation  - 03/18/23 Blood Cx: NGTD  - 03/18/2023 Urine Cx: Serratia marcescens   - Antibiotics: Meropenem (12/23 single dose at outside hospital), Cefepime (12/23 - 12/25), Rocephin (12/24 - 12/30)   - Pressors: levophed   - Admission EKG: Sinus rhythm with first degree AV block and left axis deviation  - Admission Echo: moderately reduced LV systolic function with EF 30-35% (previously 56% 01/2023); new regional wall motion abnormalities (akinesis of inferoseptal and inferolateral walls and apical segments; hypokinesis of basal inferoseptal, basal inferior, mid anteroseptal, and mid inferior walls); moderately to severely dilated RV with mild RV systolic dysfunction; known bicuspid aortic valve  - Trop: 3 (OSH); 12,690 (admission); 14,792 (4 hours post admission)  - 12/23 PCI with stenting x 2 to proximal RCA    Guideline-Based Acute Myocardial Infarction Therapies  ACUTE MYOCARDIAL INFARCTION  Type: Non-STEMI:   P2y12 Antagonist:Yes  ASA:Yes  Beta Blocker:Yes  ACE/ARB/ARNI: (If EF <40%), No: Renal dysfunction  High Intensity Statin (Atorvastatin 40-80 mg or Rosuvastatin 20-40 mg): Yes  Evaluation of Left Ventricular EF this admission:Yes: EF 35% - Is EF < 40%: Yes  Cardiac Rehab Consult - Outpatient Cardiac Rehab:Yes    Guideline Directed Medical Therapy PTA Inpatient Changes   ACEI/ARB No  Renal dysfunction     ARNI No Renal dysfunction     BB On Toprol 25 mg daily  Continue PTA Toprol   SGLT-2 Inhibitor No UTIs, current or frequent     Mineralocorticoid Receptor Antagonist No Renal dysfunction     Hydralazine/Nitrate No Hypotension     Ivabradine No; Hypotension     Heart Rhythm Management Therapy Yes (PPM)     Anticoagulation None with history of Afib/flutter  DAPT s/p PCI    Cardiac Rehab Evaluation for LVEF < or equal to 40% Yes; Date ordered:12/23             > Did not start ACEi/ARB and spironolactone due to advanced CKD. Patient would not be a good candidate for SGLT2 inhibitor due to chronic indwelling catheter with history of complicated UTIs  >  Discontinued hydralazine and isosorbide mononitrate as BP has not tolerated     -03/19/2023: Iron 27 ?g/dL, Iron Binding-TIBC 884 mcg/dL, % Saturation 16 %, Ferritin 554 ng/mL   - Admission hemoglobin 9.8  - EGD 12/30: Duodenitis could have been source of melena however no active bleeding or stigmata of  recent bleed. 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 Mucosal nodule found in the esophagus. Biopsied. Erythematous duodenopathy.       Items Needing Follow Up   Pending items or areas that need to be addressed at follow up:   Evaluate the need to repeat endoscopy in outpatient setting.  Evaluate the possibility of introducing other GDMT's in outpatient setting. Consider IV iron in outpatient setting     Pending Labs and Follow Up Radiology    Pending labs and/or radiology review at this time of discharge are listed below: Please note- any labs with collected status will not have a result; if this area is blank, there are no items for review.   Pending Labs       Order Current Status    HIGH SENSITIVITY TROPONIN I 2 HOUR Collected (03/18/23 1544)    HIGH SENSITIVITY TROPONIN PANEL In process    POC GLUCOSE In process    POC GLUCOSE In process    POC GLUCOSE In process    SURGICAL PATHOLOGY In process              Medications      Medication List      START taking these medications     aspirin 81 mg chewable tablet; Dose: 81 mg; Chew one tablet by mouth daily. Indications: treatment to prevent a heart attack; For: treatment to   prevent a heart attack; Refills: 0; Start taking on: March 27, 2023   clotrimazole 1 % topical cream; Commonly known as: LOTRIMIN; Apply    topically to affected area twice daily. Indications: a skin infection due   to the fungus Candida; For: a skin infection due to the fungus Candida;   Refills: 0   ketoconazole 2 % topical cream; Commonly known as: NIZORAL; Apply    topically to affected area twice daily. Indications: a skin infection due   to the fungus Candida; For: a skin infection due to the fungus Candida;   Refills: 0   pantoprazole DR 40 mg tablet; Commonly known as: PROTONIX; Dose: 40 mg;   Take one tablet by mouth twice daily. Indications: bleeding from stomach,   esophagus or duodenum; For: bleeding from stomach, esophagus or duodenum;   Refills: 0   sodium chloride 0.65 % nasal spray; Commonly known as: SEA MIST; Dose: 2   spray; Apply two sprays to each nostril as directed every 6 hours as   needed. Indications: dryness of the nose; For: dryness of the nose;   Refills: 0   ticagrelor 90 mg tablet; Commonly known as: BRILINTA; Dose: 90 mg; Take   one tablet by mouth twice daily. Indications: blood clot prevention   following percutaneous coronary intervention; For: blood clot prevention   following percutaneous coronary intervention; Refills: 0     CHANGE how you take these medications     allopurinoL 100 mg tablet; Commonly known as: ZYLOPRIM; Dose: 100 mg;   Take one tablet by mouth daily. Take with food.  Indications: treatment to   prevent acute gout attack; For: treatment to prevent acute gout attack;   Refills: 0; Start taking on: March 27, 2023; What changed: when to take   this, additional instructions   atorvastatin 40 mg tablet; Commonly known as: LIPITOR; Dose: 40 mg; Take   one tablet by mouth at bedtime daily. Indications: hardening of the   arteries due to plaque buildup; For: hardening of the arteries due to plaque buildup; Refills: 0; What changed: when to take this   furosemide 20 mg tablet; Commonly known as: LASIX; Dose: 40 mg; Take two   tablets by mouth daily as needed (Weight yourself every morning and take   it if weight gain of >5 lbs in 48 hrs). Indications: visible water   retention; For: visible water retention; Refills: 0; What changed: when to   take this, reasons to take this   metoprolol succinate XL 25 mg extended release tablet; Commonly known   as: TOPROL XL; Dose: 25 mg; Take one tablet by mouth daily. Indications:   myocardial reinfarction prevention; For: myocardial reinfarction   prevention; Refills: 0; What changed: how much to take   pramipexole 0.75 mg tablet; Commonly known as: MIRAPEX; Dose: 0.75 mg;   Take one tablet by mouth at bedtime daily. Indications: restless legs   syndrome, an extreme discomfort in the calf muscles when sitting or lying   down; For: restless legs syndrome, an extreme discomfort in the calf   muscles when sitting or lying down; Refills: 0; What changed: medication   strength, how much to take     CONTINUE taking these medications     CHOLEcalciferoL (vitamin D3) 50,000 units capsule; Commonly known as:   OPTIMAL D3; Dose: 50,000 Units; Refills: 0   docusate 100 mg capsule; Commonly known as: COLACE; Dose: 100 mg;   Refills: 0   FREESTYLE LIBRE 3 SENSOR sensor device; Generic drug: blood-glucose   sensor; Dose: 1 each; Refills: 0   gabapentin 100 mg capsule; Commonly known as: NEURONTIN; Dose: 1   capsule; Refills: 0   insulin aspart (U-100) 100 unit/mL (3 mL) PEN; Commonly known as:   NOVOLOG FLEXPEN U-100 INSULIN; Dose: 0-6 Units; Inject zero Units to six   Units under the skin three times daily with meals. Correction Factor if:   BS 181-220=2 units, BS 221-260=4 units, BS 261-300=6 units, BS 301-350=8   units, BS 351-400=10 units, BS >400=12 units  Indications: type 2 diabetes   mellitus; For: type 2 diabetes mellitus; Refills: 0   levothyroxine 75 mcg tablet; Commonly known as: SYNTHROID; Dose: 75 mcg;   Refills: 0   nitroglycerin 0.4 mg tablet; Commonly known as: NITROSTAT; Dose: 0.4 mg;   Place 1 tablet under tongue every 5 minutes as needed for Chest Pain.;   Quantity: 25 tablet; Refills: 1   OZEMPIC 1 mg/dose (4 mg/3 mL) injection PEN; Generic drug: semaglutide;   Dose: 1 mg; Inject 1 mg under the skin every 7 days.; Quantity: 9 mL;   Refills: 2   polyethylene glycol 3350 17 g packet; Commonly known as: MIRALAX; Dose:   17 g; Take one packet by mouth daily as needed (Constipation - Second   Line). Indications: constipation; For: constipation; Refills: 0   sennosides-docusate sodium 8.6/50 mg tablet; Commonly known as:   SENOKOT-S; Dose: 1 tablet; Take one tablet by mouth daily as needed   (Constipation - First Line). Indications: constipation; For: constipation;   Refills: 0       Return Appointments and Scheduled Appointments     Scheduled appointments:      Apr 04, 2023 9:30 AM  Office visit with  Winfred Leeds, APRN-NP  Cardiovascular [facility]: [facility] ([facility] Exam) [address]  [address], [place]  [place] [address]  [phone]     Apr 17, 2023 11:00 AM  Office visit with Doran Durand, MD  Nephrology: [facility] (Internal Medicine) [address]  [address]  [place] [address]  [phone]     Apr 24, 2023 9:30 AM  Telehealth Visit with Leanne Lovely, PHARMD  Endocrinology: [facility] (Internal Medicine) [address]  [address]  [place] [address]  [phone]     May 22, 2023 2:00 PM  Office visit with Sherron Monday, MD  Urology: [facility] (Urology) [address]  [address]  [place] [address]  [phone]     Jul 02, 2023 10:00 AM  Office visit with Vanice Sarah, MD  Cardiovascular Medicine: [facility] (CVM Exam) [address]  [address]  [place] [address]  [phone]     Aug 01, 2023 2:00 PM  Office visit with Betti Cruz, APRN-NP  Endocrinology: [facility] (Internal Medicine) [address]  [address]  [place] [address]  [phone]          Contact information for after-discharge care                [facility]       [facility] [place] [facility] [place]    Phone: [phone]    Fax: [phone]    Where: [address] [place] [address]    Service: Home Health Services                  Consults, Procedures, Diagnostics, Micro, Pathology   Consults: Cardiology, GI, ID, and Otolaryngology  Surgical Procedures & Dates:  12/23 PCI  Significant Diagnostic Studies, Micro and Procedures: noted in brief hospital course  Significant Pathology: noted in brief hospital course              Wound:      Wounds Moisture associated skin damage Anterior;Right;Left;Medial Pelvis (Active)   03/18/23 1500   Wound Type: Moisture associated skin damage   Orientation: Anterior;Right;Left;Medial   Location: Pelvis   Wound Location Comments:    Initial Wound Site Closure:    Initial Dressing Placed:    Initial Cycle:    Initial Suction Setting (mmHg):    Pressure Injury Stages:    Pressure Injury Present Within 24 Hours of Hospital Admission:    If This Pressure Injury Is Suspected to Be Device Related, Please Select the Device::    Is the Wound Open or Closed:    Wound Assessment Moist;Pink 03/26/23 0356   Peri-wound Assessment Pink;Red 03/26/23 0356   Wound Drainage Amount None 03/26/23 0356   Wound Dressing Status None/open to air 03/26/23 0356   Wound Care Treatment or ointment applied 03/25/23 2156   Wound Dressing and/or Treatment Pharmaceutical treatment (see MAR) 03/26/23 0356   Number of days: 8       Wounds Pressure injury Sacrum (Active)   03/18/23 1500   Wound Type: Pressure injury   Orientation:    Location: Sacrum   Wound Location Comments:    Initial Wound Site Closure:    Initial Dressing Placed:    Initial Cycle:    Initial Suction Setting (mmHg):    Pressure Injury Stages: Stage 1 Pressure  Injury Present Within 24 Hours of Hospital Admission:    If This Pressure Injury Is Suspected to Be Device Related, Please Select the Device::    Is the Wound Open or Closed:    Wound Assessment Pink;Red 03/26/23 0426   Peri-wound Assessment Intact 03/26/23 0426   Wound Drainage Amount None 03/26/23 0426   Wound Dressing Status Intact 03/26/23 0426   Wound Care Dressing changed or new application 03/26/23 0426   Wound Dressing and/or Treatment Foam 03/26/23 0426   Number of days: 8                           Discharge Disposition, Condition   Patient Disposition: Skilled Nursing Facility [03]  Condition at Discharge: Stable    Code Status   Full Code    Patient Instructions     Activity       Activity as Tolerated   As directed      It is important to keep increasing your activity level after you leave the hospital.  Moving around can help prevent blood clots, lung infection (pneumonia) and other problems.  Gradually increasing the number of times you are up moving around will help you return to your normal activity level more quickly.  Continue to increase the number of times you are up to the chair and walking daily to return to your normal activity level. Begin to work towards your normal activity level at discharge.          Diet       Cardiac Diet   As directed      Limiting unhealthy fats and cholesterol is the most important step you can take in reducing your risk for cardiovascular disease.  Unhealthy fats include saturated and trans fats.  Monitor your sodium and cholesterol intake.  Restrict your sodium to 2g (grams) or 2000mg  (milligrams) daily, and your cholesterol to 200mg  daily.    If you have questions regarding your diet at home, you may contact a dietitian at [phone].             Discharge education provided to patient., Signs and Symptoms:   Report these signs and symptoms       Report These Signs and Symptoms   As directed      Please contact your doctor if you have any of the following symptoms: *Continue medicines as direct on your discharge medication list. Get an up to date list with every visit and take as instructed. Do NOT stop taking any medications without speaking with your doctor or nurse who knows you.    *Remember to weigh yourself first thing in the morning after using the restroom and write it down. Take this record to your doctor appointment.    *Chart symptoms such as fatigue, trouble breathing, or swelling. Call your doctor right away if these symptoms get worse.    *Remember, do not eat more than 2000 milligrams of sodium a day. Watch out for packaged, processed, canned and restaurant foods.    Call your doctor (your cardiologist, if you have one) if:  -you gain more than 2 pounds in 24 hours.  -you gain more than 5 pounds in 1 week.  -any of your symptoms get worse  Do NOT wait to let your doctor know about these changes.        , Education:   Education       Risk Reduction Plan   As  directed      Cardiac Event Personal Risk Factor Reduction Plan  **Take this sheet to your physician to show treatment recommendations**  Paul Benjamin  Admission Date: 03/18/2023 LOS: @LOS @       Blood Pressure Risk  Goal: Keep blood pressure below 130/80  Your Numbers:  BP Readings from Last 1 Encounters:  03/26/23 : 96/48     Plan: High blood pressure is the single most important risk factor for cardiac events because it's the #1 cause of cardiac events.  Take medication as prescribed and monitor your blood pressure.    Abnormal lipids (fats in blood) Risk   Goal: Total Cholesterol: <200  LDL (bad cholesterol): primary prevention <100   LDL (bad cholesterol): secondary prevention < 70  HDL (good cholesterol): >40 for men, >50 for women  Triglycerides: <150  Your Numbers: Cholesterol       Date                     Value               Ref Range           Status                03/19/2023               76                  <200 mg/dL          Final                 01/31/2023 84                                      Final            ----------  LDL       Date                     Value               Ref Range           Status                03/19/2023               38                  <100 mg/dL          Final                 01/31/2023               39                                      Final            ----------  HDL       Date                     Value               Ref Range           Status  03/19/2023               26 (L)              >40 mg/dL           Final                 01/31/2023               23                                      Final            ----------  Triglycerides       Date                     Value               Ref Range           Status                03/19/2023               123                 <150 mg/dL          Final                 01/31/2023               114                                     Final            ----------  Plan: Diets high in saturated fat, trans fat and cholesterol can raise blood cholesterol levels increasing your risk of having a cardiac event.  Take medication as prescribed and eat a heart healthy, low sodium diet.    Smoking Risk   Goals: If you smoke, STOP!   Plan: Smoking DOUBLES your risk of having a cardiac event. Quitting can greatly reduce your risk. To register for smoking cessation program call 1-800-QUIT-NOW (765-851-8030) or visit www.smokefree.gov    Diabetes Risk   Goal: Non-diabetic: Below 5.7%  Goal for diabetic: Less than 7%  Your Numbers: Hemoglobin A1C       Date                     Value               Ref Range           Status                03/19/2023               5.8 (H)             4.0 - 5.7 %         Final              Comment:    The ADA recommends that most patients with type 1 and type 2 diabetes maintain an A1c level <7%.       10/17/2021               6.2  Final            ----------  Plan: If you have diabetes, even if treated, you are at an increased risk of having a cardiac event.     Alcohol Use Risk  Goal: Alcohol use can lead to a cardiac event.  Plan: For men, limit intake to no more than 2 drinks per day.  For women, limit intake to no more than one drink per day.    Weight Management Risk   Goal: Healthy: BMI is 18.5 to 24.9  Overweight: BMI is 25 to 29.9  Obese: BMI is 30 or higher  Morbid Obesity: 40 or higher  Your Numbers: Your BMI (Calculated): 30.13  Plan: If you have questions about your diet after you go home, you can call a dietitian at [phone]    Physical Activity Risk   Goal: Patients should have approval by a physician prior to beginning an exercise program.  Plan: Try to get at least 30 minutes of moderate physical activity five days a week or 20 minutes of vigorous physical activity three days a week with your doctor's approval.    Knowing the signs and symptoms of stroke and understanding the risk factors that can lead to both a heart attack and stroke are important for staying healthy. Heart attacks and strokes have a significant overlap, as many of the same lifestyle, family history, and health conditions can increase the chances of both cardiovascular and stroke events.    B.E. F.A.S.T. is an easy way to remember the sudden signs of stroke.    B.E. F.A.S.T. Stands for...    B  -  Balance  Is there a sudden difficulty with balance or coordination? Is walking or sitting difficult?  E  -  Eyes  Is there a sudden change in eyesight such as blurred vision, double vision, or a loss of vision in one or both eyes without pain?   F  -  Face   Does one side of the face droop or is it numb?  Ask the person to smile.  A  -  Arm   Is one arm weak or numb?  Ask the person to raise both arms.  Does one arm drift downward?  S  -  Speech   Is speech slurred or difficult to understand?  Are they unable to speak at all?  Ask the person to repeat is simple sentence like It is a bright and sunny day.    T  -  Time to call 911!  If a person shows any of these symptoms, even if they go away, call 911 to get them to the hospital immediately.  Time is very important.  The sooner they get to the hospital, the more likely they are to receive stroke reversing and potentially life-saving treatment.    Risk Reduction Plan   As directed      Cardiac Event Personal Risk Factor Reduction Plan  **Take this sheet to your physician to show treatment recommendations**  Paul Benjamin  Admission Date: 03/18/2023 LOS: @LOS @       Blood Pressure Risk  Goal: Keep blood pressure below 130/80  Your Numbers:  BP Readings from Last 1 Encounters:  03/26/23 : 92/44     Plan: High blood pressure is the single most important risk factor for cardiac events because it's the #1 cause of cardiac events.  Take medication as prescribed and monitor your blood pressure.    Abnormal lipids (fats in  blood) Risk   Goal: Total Cholesterol: <200  LDL (bad cholesterol): primary prevention <100   LDL (bad cholesterol): secondary prevention < 70  HDL (good cholesterol): >40 for men, >50 for women  Triglycerides: <150  Your Numbers: Cholesterol       Date                     Value               Ref Range           Status                03/19/2023               76                  <200 mg/dL          Final                 01/31/2023               84                                      Final            ----------  LDL       Date                     Value               Ref Range           Status                03/19/2023               38                  <100 mg/dL          Final                 01/31/2023               39                                      Final            ----------  HDL       Date                     Value               Ref Range           Status                03/19/2023               26 (L)              >40 mg/dL           Final                 01/31/2023               23  Final            ----------  Triglycerides       Date Value               Ref Range           Status                03/19/2023               123                 <150 mg/dL          Final                 01/31/2023               114                                     Final            ----------  Plan: Diets high in saturated fat, trans fat and cholesterol can raise blood cholesterol levels increasing your risk of having a cardiac event.  Take medication as prescribed and eat a heart healthy, low sodium diet.    Smoking Risk   Goals: If you smoke, STOP!   Plan: Smoking DOUBLES your risk of having a cardiac event. Quitting can greatly reduce your risk. To register for smoking cessation program call 1-800-QUIT-NOW (308-021-9708) or visit www.smokefree.gov    Diabetes Risk   Goal: Non-diabetic: Below 5.7%  Goal for diabetic: Less than 7%  Your Numbers: Hemoglobin A1C       Date                     Value               Ref Range           Status                03/19/2023               5.8 (H)             4.0 - 5.7 %         Final              Comment:    The ADA recommends that most patients with type 1 and type 2 diabetes maintain an A1c level <7%.       10/17/2021               6.2                                     Final            ----------  Plan: If you have diabetes, even if treated, you are at an increased risk of having a cardiac event.     Alcohol Use Risk  Goal: Alcohol use can lead to a cardiac event.  Plan: For men, limit intake to no more than 2 drinks per day.  For women, limit intake to no more than one drink per day.    Weight Management Risk   Goal: Healthy: BMI is 18.5 to 24.9  Overweight: BMI is 25 to 29.9  Obese: BMI is  30 or higher  Morbid Obesity: 40 or higher  Your Numbers: Your BMI (Calculated): 30.13  Plan: If you have questions about your diet after you go home, you can call a dietitian at [phone]    Physical Activity Risk   Goal: Patients should have approval by a physician prior to beginning an exercise program.  Plan: Try to get at least 30 minutes of moderate physical activity five days a week or 20 minutes of vigorous physical activity three days a week with your doctor's approval.    Knowing the signs and symptoms of stroke and understanding the risk factors that can lead to both a heart attack and stroke are important for staying healthy. Heart attacks and strokes have a significant overlap, as many of the same lifestyle, family history, and health conditions can increase the chances of both cardiovascular and stroke events.    B.E. F.A.S.T. is an easy way to remember the sudden signs of stroke.    B.E. F.A.S.T. Stands for...    B  -  Balance  Is there a sudden difficulty with balance or coordination? Is walking or sitting difficult?  E  -  Eyes  Is there a sudden change in eyesight such as blurred vision, double vision, or a loss of vision in one or both eyes without pain?   F  -  Face   Does one side of the face droop or is it numb?  Ask the person to smile.  A  -  Arm   Is one arm weak or numb?  Ask the person to raise both arms.  Does one arm drift downward?  S  -  Speech   Is speech slurred or difficult to understand?  Are they unable to speak at all?  Ask the person to repeat is simple sentence like It is a bright and sunny day.    T  -  Time to call 911!  If a person shows any of these symptoms, even if they go away, call 911 to get them to the hospital immediately.  Time is very important.  The sooner they get to the hospital, the more likely they are to receive stroke reversing and potentially life-saving treatment.        , and Others Instructions:   Other Orders       COVID-19 TESTING NOT REQUIRED   Complete by: As directed      COVID-19 TESTING NOT REQUIRED   Complete by: As directed      Complete if patient is going to a Skilled Nursing Facility   Complete by: As directed      I certify that the patient requires skilled care: Yes    The patient's stay is expected to be less than 30 days: Yes    I will be in charge of patient in nursing home: No    Questions About Your Stay   Complete by: As directed      Discharging attending physician: Altamease Oiler    Order comments: For questions or concerns regarding your hospital stay:    - DURING BUSINESS HOURS (8:00 AM - 4:30 PM):  Call [phone] and asked to be transferred to your discharge attending physician.    - AFTER BUSINESS HOURS (4:30 PM - 8:00 AM, on weekends, or holidays):  Call [phone] and ask the operator to page the on-call doctor for the discharge attending physician.    Questions About Your Stay   Complete by: As directed  Discharging attending physician: Altamease Oiler    Order comments: For questions or concerns regarding your hospital stay:    - DURING BUSINESS HOURS (8:00 AM - 4:30 PM):  Call [phone] and asked to be transferred to your discharge attending physician.    - AFTER BUSINESS HOURS (4:30 PM - 8:00 AM, on weekends, or holidays):  Call [phone] and ask the operator to page the on-call doctor for the discharge attending physician.            Additional Orders: Case Management, Supplies, Home Health     Home Health/DME                HOME HEALTH/DME  ONCE        Comments: Home Health/Durable Medical Equipment Order Details    Patient Name:  Paul Benjamin                   Medical Record Number:   [id number]    Patient Diagnosis & ICD 10 Codes: Acute heart failure with reduced ejection fraction (HFrEF, <= 40%) (HCC)  Patient Height: 177.8 cm (5' 10)   Patient Weight: 96.4 kg (212 lb 8.4 oz)    Provider Information:  MD Name: Mal Misty  MD Phone Number: [phone]    Fax:  [phone]      NPI:  [id number]    Patient now stable for discharge. Please resume the following disciplines: SN, PT, OT  RN to complete general assessment, including vitals with temperature, monitor and teach patient and/or caregiver medication management and compliance, monitor and teach pain management and pain medication compliance when necessary. Monitor and teach signs and symptoms of infection, monitor and teach disease management, including signs and symptoms of diet, who and when to call, hospital readmission avoidance. Please see attached AVS for additional discharge instruction.    Physical Therapy  to evaluate and treat along with home safety evaluation.  Teach strategies for energy conservation, mobility training, strengthening, balance activities and address deficits, maximize function and improve safety.    Occupational Therapy   to evaluate and treat along with home safety evaluation.  Teach strategies for energy conservation, mobility training, strengthening, balance activities and address deficits, maximize function and improve safety.    Clinical findings to support homebound status: Poor tolerance for activity  Clinical findings to support home care services: Muscle weakness affecting functional activities    I certify that this patient is under my care and that I, or a nurse practitioner or physician's assistant working with me, had a face-to-face encounter that meets the physician's face-to-face encounter requirements with this patient on 03/19/23.    This patient is under my care, and I have initiated the establishment of the plan of care.  This patient will be followed by a physician after discharge, who will periodically review the plan of care.                         Signed:  Candelaria Celeste, MD  03/26/2023      cc:  Primary Care Physician:  [name]   Verified    Referring physicians:  Unknown, Unknown, MD   Additional provider(s):        Did we miss something? If additional records are needed, please fax a request on office letterhead to [phone]. Please include the patient's name, date of birth, fax number and type of information needed. Additional request can be made by email at  [email or web address]. For general questions of information about electronic records sharing, call [phone].

## 2023-03-26 NOTE — Progress Notes
 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 man with a past medical history of AD polycystic kidney disease, HFpEF, CKD Stage IV, T2DM, anemia of chronic disease, history of complicated UTI with urinary retention and bilateral pyelonephritis, urge incontinence, chronic indwelling foley catheter with plans to transition to suprapubic in February of 2025, recent history of lower extremity cellulitis, historically low blood pressure with transition from Coreg to metoprolol, SSS s/p dual chamber PPM (2008), afib s/p ablation (2009), HTN, CAD s/p stenting x2 to LAD and proximal portion of diagonal branch (2005), dyslipidemia, and OSA compliant with CPAP who presented to [place] as a transfer from [facility] and was found to be in septic shock most likely from the GU tract with increasing troponin and reported chest pain. He was taken to cath lab on 12/23 with stenting x2 to the proximal RCA. He was then transferred to the CICU for monitoring s/p PCI.      Urine culture positive for pan sensitive Serratia marcescens. Antibiotics de-escalated to Rocephin on 12/25. GDMT limited due to advanced CKD. Initiation of BB on 12/25 as the patient's BP stable off of pressor support. Hospital course complicated by melena and epistaxis, Hg remaining stable. EGD on 12/30 without evidence of bleeding. PPI switched to oral BID. Rocephin (12/24 - 12/30), completed a seven day course.    Interval Updates:   > Continue PPI   > Continue to hold PTA Lasix     #Urosepsis   #History of complicated UTIs   #Intertrigo complicated by candidal infection  - Recent history of UTI treated with Augmentin transitioned to Keflex in early December, bilateral pyelonephritis treated with Zosyn transitioned to Augmentin early November, and doxycycline 100 mg BID for cellulitis 11/5 - 11/14  - Admission UA showing packed WBCs, 20-50 RBCs, moderate bacteria but was nitrite negative. 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   - Admission CXR without signs of consolidation  - 03/18/23 Blood Cx: NGTD  - 03/18/2023 Urine Cx: Serratia marcescens   - Antibiotics:               Meropenem (12/23 single dose at outside hospital)               Cefepime (12/23 - 12/25)                Rocephin (12/24 - 12/30)   Plan:  >  Continue clotrimazole topical cream BID for candidal intertrigo  > Wound team following  > Rocephin (12/24 - 12/30), to completed a seven day course  > ID following, appreciate recs     #NSTEMI s/p PCI with stenting x2 to proximal RCA (03/18/23)   #CAD s/p PCI to LAD and proximal portion of diagonal branch (2005)  #HFpEF  #SSS s/p dual chamber PPM (2008)  #Afib s/p ablation (2009)  #Mixed dyslipidemia  - Pressors: levophed titrated to goal MAP > 65   - Admission EKG: Sinus rhythm with first degree AV block and left axis deviation  - Post cath EKG: Sinus rhythm with first degree AV block and left axis deviation  - Admission Echo: moderately reduced LV systolic function with EF 30-35% (previously 56% 01/2023); new regional wall motion abnormalities (akinesis of inferoseptal and inferolateral walls and apical segments; hypokinesis of basal inferoseptal, basal inferior, mid anteroseptal, and mid inferior walls); moderately to severely dilated RV with mild RV systolic dysfunction; known bicuspid aortic valve  - Trop: 3 (OSH); 12,690 (admission); 14,792 (4 hours post admission)  - 12/23 PCI with stenting x 2 to proximal RCA        Lab Results   Component Value Date     CHOL 76 03/19/2023     TRIG 123 03/19/2023     HDL 26 (L) 03/19/2023     LDL 38 03/19/2023     VLDL 24.6 03/19/2023     NONHDLCHOL 50 03/19/2023     CHOLHDLC 4 06/05/2021      Guideline Directed Medical Therapy PTA Inpatient Changes   ACEI/ARB No  Renal dysfunction     ARNI No Renal dysfunction     BB On Toprol 25 mg daily  Continue PTA Toprol   SGLT-2 Inhibitor No UTIs, current or frequent     Mineralocorticoid Receptor Antagonist No Renal dysfunction     Hydralazine/Nitrate No Hypotension     Ivabradine No; Hypotension     Heart Rhythm Management Therapy Yes (PPM)     Anticoagulation None with history of Afib/flutter  DAPT s/p PCI    Cardiac Rehab Evaluation for LVEF < or equal to 40% Yes; Date ordered: 12/23     7-Day post hospital follow up scheduled within 24-48 hours of discharge No Discharge not expected within the next 24-48 hours        Guideline-Based Acute Myocardial Infarction Therapies    ACUTE MYOCARDIAL INFARCTION  Type: Non-STEMI:   P2y12 Antagonist:  Yes    ASA:  Yes    Beta Blocker:  Yes    ACE/ARB/ARNI: (If EF <40%)  No: Renal dysfunction    High Intensity Statin (Atorvastatin 40-80 mg or Rosuvastatin 20-40 mg):  Yes    Evaluation of Left Ventricular EF this admission:  Yes: EF 35% - Is EF < 40%: Yes    Cardiac Rehab Consult - Outpatient Cardiac Rehab:  Yes     - PTA meds: Atorvastatin 40mg  qHS, furosemide 40mg  qAM, metoprolol succinate 12.5mg  daily, and sublingual nitro  - Device interrogation: 9.1% RV paced (programmed MVP-->RV pacing likely r/t intermittent second degree AVB). Cardiac compass shows fairly significant increase in RV pacing over the last 1-2days  - HgA1c: 5.8%   Plan:  > Continue increased dose of atorvastatin 80 mg qHS  > Continue to hold PTA furosemide   > Tentative plan for repeat TTE in the outpatient setting to reassess LV function  > Continue PTA metoprolol succinate   > Will not start ACEi/ARB and  spironolactone due to advanced CKD. Patient would not be a good candidate for SGLT2 inhibitor due to chronic indwelling catheter with history of complicated UTIs  > Continue aspirin 81mg  daily and ticagrelor 90mg  BID  > Continue monitoring patient on telemetry. Replace lytes prn, K > 4, Mg > 2   > 500LR before discharge     #Suspected melena   #Acute on chronic normocytic normochromic anemia   #Hx anemia of chronic disease  - Baseline hemoglobin 9.5-10.5  03/19/2023: Iron 27 ?g/dL (L)  16/12/9602: Iron Binding-TIBC 167 mcg/dL (L)  54/11/8117: % Saturation 16 % (L)  03/19/2023: Ferritin 554 ng/mL (H)   - Admission hemoglobin 9.8  - EGD 12/30: Duodenitis could have been source of melena however no active bleeding or stigmata of  recent bleed. 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 Mucosal nodule found in the esophagus. Biopsied. Erythematous duodenopathy.   Plan:  > Transfuse if Hgb <7 or <8 with active bleeding  > DVT Ppx: holding   > Continue PPI BID (12/26 - ), now oral  > GI following, appreciate assistance   > Consider IV iron in outpatient setting     #Epistaxis, resolved  > Consulted ENT, appreciate recs   > Continue saline nasal spray q6hr, vaseline BID, and humidified air w/ CPAP    #AD Polycystic Kidney Disease  #CKD Stage IV  - Etiology of CKD most likely combination of T2DM (well controlled), ADPKD, and HFpEF   - Recent admission with AKI due to urinary retention and bilateral pyelonephritis  - Baseline creatinine 2.5-3.0   - Indwelling foley replaced 03/18/23. Plans to get suprapubic catheter placed in February 2025  - Follows with Dr. Philomena Course in the outpatient setting   Plan:  > Replace electrolytes PRN   > Continue to monitor kidney function      #Chronic indwelling foley catheter   #Overactive bladder/Urge Incontinence   #Hx urinary retention   - Patient has a chronic indwelling catheter for urge incontinence/OAB and urinary retention  - Follows with urology in the outpatient setting   Plan:   > Appreciate urology recs     #Gout  #Venous stasis changes bilateral lower extremities   Plan:  > Continue renal dosed allopurinol 100mg  daily    #DMT2 with long-term insulin use  #Hypothyroidism  - 03/19/23 A1c 5.8  - 03/19/23: TSH 1.94  - PTA Medications: Levothyroxine qAM, Ozempic 1mg  weekly (last dose on 12/20), and aspart LDCF   Plan:  > POC blood glucose monitoring ordered ACHS with LDCF  > Continue PTA levothyroxine qAM  > holding PTA Ozempic    #OSA, compliant with CPAP  - Admission ABG: 7.38/30/189  - Admission CXR: No consolidation, pleural effusion, or pneumothorax  Plan:  > Continue home CPAP     #Restless Leg Syndrome  - PTA Medications: Pramipexole 1.5mg  qHS and gabapentin 100mg  qHS  Plan:  > Continue renal dosed pramipexole 0.75mg  qHS and gabapentin 100mg  qHS    Nutrition: No Dietitian Consult  Wound: Wound Documentation Wound:      Wounds Moisture associated skin damage Anterior;Right;Left;Medial Pelvis (Active)   03/18/23 1500   Wound Type: Moisture associated skin damage   Orientation: Anterior;Right;Left;Medial   Location: Pelvis   Wound Location Comments:    Initial Wound Site Closure:    Initial Dressing Placed:    Initial Cycle:    Initial Suction Setting (mmHg):    Pressure Injury Stages:  Pressure Injury Present Within 24 Hours of Hospital Admission:    If This Pressure Injury Is Suspected to Be Device Related, Please Select the Device::    Is the Wound Open or Closed:    Wound Assessment Moist;Pink 03/26/23 0356   Peri-wound Assessment Pink;Red 03/26/23 0356   Wound Drainage Amount None 03/26/23 0356   Wound Dressing Status None/open to air 03/26/23 0356   Wound Care Treatment or ointment applied 03/25/23 2156   Wound Dressing and/or Treatment Pharmaceutical treatment (see MAR) 03/26/23 0356   Number of days: 8       Wounds Pressure injury Sacrum (Active)   03/18/23 1500   Wound Type: Pressure injury   Orientation:    Location: Sacrum   Wound Location Comments:    Initial Wound Site Closure:    Initial Dressing Placed:    Initial Cycle:    Initial Suction Setting (mmHg):    Pressure Injury Stages: Stage 1   Pressure Injury Present Within 24 Hours of Hospital Admission:    If This Pressure Injury Is Suspected to Be Device Related, Please Select the Device::    Is the Wound Open or Closed:    Wound Assessment Pink;Red 03/26/23 0426   Peri-wound Assessment Intact 03/26/23 0426   Wound Drainage Amount None 03/26/23 0426   Wound Dressing Status Intact 03/26/23 0426   Wound Care Dressing changed or new application 03/26/23 0426   Wound Dressing and/or Treatment Foam 03/26/23 0426   Number of days: 8                   Checklist:  FEN: no fluids, replace lytes prn, cardiac diet    LDA: PIV x 1 and chronic indwelling foley catheter   VTE PPx: holding   Code status: Full Code   Dispo: Continue admit to cardiology     Candelaria Celeste, MD  Internal Medicine, PGY-1  Available on Voalte    Patient seen and discussed with Dr. Sandria Manly     ________________________________________________________________________    Interval History: No acute events overnight.  Patient reports 6 medium to large sized loose stools yesterday.  Today had only 1 bowel movement so far.  Denies any other symptoms or complaints.    Review of Systems: 10 point comprehensive ROS is negative, except for:  as stated above     Medications:  Scheduled Meds:allopurinoL (ZYLOPRIM) tablet 100 mg, 100 mg, Oral, QDAY  aspirin chewable tablet 81 mg, 81 mg, Oral, QDAY  atorvastatin (LIPITOR) tablet 80 mg, 80 mg, Oral, QHS  clotrimazole (LOTRIMIN) 1 % topical cream, , Topical, BID  gabapentin (NEURONTIN) capsule 100 mg, 100 mg, Oral, QHS  [Held by Provider] heparin (porcine) PF syringe 5,000 Units, 5,000 Units, Subcutaneous, Q8H  ketoconazole (NIZORAL) 2 % topical cream, , Topical, BID  levothyroxine (SYNTHROID) tablet 75 mcg, 75 mcg, Oral, QDAY  metoprolol succinate XL (TOPROL XL) tablet 25 mg, 25 mg, Oral, QDAY  pantoprazole DR (PROTONIX) tablet 40 mg, 40 mg, Oral, BID(11-21)  polyethylene glycol 3350 (MIRALAX) packet 17 g, 1 packet, Oral, QDAY  pramipexole (MIRAPEX) tablet 0.75 mg, 0.75 mg, Oral, QHS  sennosides-docusate sodium (SENOKOT-S) tablet 1 tablet, 1 tablet, Oral, BID  sodium chloride (SEA MIST) 0.65 % nasal spray 2 spray, 2 spray, Each Nostril, QID  ticagrelor (BRILINTA) tablet 90 mg, 90 mg, Oral, BID    Continuous Infusions:  PRN and Respiratory Meds:acetaminophen Q4H PRN, ondansetron (ZOFRAN) IV Q6H PRN      Objective:  Physical Exam:  Vital Signs: Last Filed Vital Signs: 24 Hour Range   BP: 112/60 (12/31 0353)  Temp: 36.5 ?C (97.7 ?F) (12/31 0347)  Pulse: 76 (12/31 0457)  Respirations: 14 PER MINUTE (12/31 0353)  SpO2: 100 % (12/31 0353)  O2%: 21 % (12/30 2147)  O2 Device: CPAP/BiPAP (12/31 0353)  O2 Liter Flow: 5 Lpm (12/30 1015) BP: (82-116)/(50-61)   Temp:  [36.4 ?C (97.5 ?F)-37.2 ?C (99 ?F)]   Pulse:  [70-85]   Respirations:  [14 PER MINUTE-22 PER MINUTE]   SpO2:  [95 %-100 %]   O2%:  [21 %]   O2 Device: CPAP/BiPAP  O2 Liter Flow: 5 Lpm     Physical Exam  Constitutional:       Appearance: He is not toxic-appearing.   HENT:      Head: Normocephalic and atraumatic.      Nose: Nose normal.      Mouth/Throat:      Mouth: Mucous membranes are moist.   Eyes:      General: No scleral icterus.     Conjunctiva/sclera: Conjunctivae normal.   Cardiovascular:      Rate and Rhythm: Normal rate and regular rhythm.      Heart sounds: Normal heart sounds.      Comments: +1 LE edema  Pulmonary:      Effort: Pulmonary effort is normal. Breath sounds: Normal breath sounds. No stridor. No wheezing.   Abdominal:      General: Abdomen is flat. Bowel sounds are normal.      Palpations: Abdomen is soft.      Tenderness: There is no abdominal tenderness.   Skin:     General: Skin is warm and dry.   Neurological:      General: No focal deficit present.      Mental Status: He is alert.   Psychiatric:         Mood and Affect: Mood normal.         Thought Content: Thought content normal.         Judgment: Judgment normal.          Intake/Output Summary (last 24 hr):    Intake/Output Summary (Last 24 hours) at 03/26/2023 4259  Last data filed at 03/26/2023 0355  Gross per 24 hour   Intake 320 ml   Output 700 ml   Net -380 ml         Last bowel movement: 03/25/23      Lab / Radiology / Other Diagnostic Tests:  Pertinent labs and radiology reviewed.

## 2023-03-26 NOTE — Progress Notes
 HC5 END OF SHIFT/PLAN OF CARE NURSING NOTE   Nursing Shift: Night Shift 1900-0700    Acute events, nursing interventions, & communication with providers: No acute events.       Patient Goal(s)  Patient will Report progressive increase in activity tolerance by the end of next shift.        Patient will  Verbalize readiness for discharge by discharge.   Admission Weight: Weight: 95.3 kg (210 lb)    Last 3 Weights:   Vitals:    03/24/23 0500 03/25/23 0500 03/26/23 0410   Weight: 98.9 kg (218 lb 0.6 oz) 98 kg (216 lb 0.8 oz) 98 kg (216 lb 0.8 oz)     Weight Change: Weight trend stable    Intake/Output Summary (Last 24 hours) at 03/26/2023 0756  Last data filed at 03/26/2023 0355  Gross per 24 hour   Intake 320 ml   Output 700 ml   Net -380 ml     Last Bowel Movement Date: 03/25/23    Fluid Restriction? No   Quality/Safety    Total Fall Risk Score: 16   Risk for Injury related to falls: Surgery/procedure within the last 24 hours and Standard risk for injury based on the ABCS scoring tool  Fall Risk Category:   History of More Than One Fall Within 6 Months Before Admission: No  Elimination, Bowel and Urine: Incontinence OR urgency OR frequency  Interventions: Bed pan available in room  Medications: Sedated procedure within past 24 hours  Interventions: Use of gait belt , Stay within arm's reach during toileting/showering (i.e., dizziness, orthostasis) , and Educate patient on medication side effects  Patient Care Equipment: One present  Interventions: Needs assistance with patient care equipment when ambulating, Ensure environment is free of clutter and walkways are clear from tripping hazards, and Assess need for patient equipment and remove if not in use  Mobility: 2 - Assistance required, 2 - Unsteady gait  Interventions: Assist x2, Gait belt in use when ambulating, Utilize walker, cane, or additional walking aid for ambulation, and Ensure the use of corrective lens and/or hearing aides are in place prior to ambulation Cognition: 0 - No cognition issues  Interventions: N/A - Does not score as risk in this category    Other safety precautions in place: HAPI Prevention Bundle    Restraints:  No      Patient Education  This RN provided education to Patient today. The following education topics were reviewed:  Quality/Safety Education:   CAUTI prevention, Pressure injury prevention/care, and VTE prophylaxis  Medication Education:   Medication management (Indication, adverse effects, monitoring, etc)  Education provided on the following medication(s): PO Protonix   Cardiac - Specific Education:   Heart failure management  General Education:   Bowel/Urinary elimination, Discharge planning, and Mobility/Activity intolerance    The following teaching method(s) were used: Verbal  Response to learning: Verbalizes Understanding  Needs reinforcement on: N/A

## 2023-03-26 NOTE — Progress Notes
 Gastroenterology Consultation - Follow Up      HISTORY OF PRESENT ILLNESS  Paul Benjamin is a pleasant 77 y.o. 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 He is now s/p PCI to right coronary artery on 12/23. He was also found to have urosepsis that is resolved. GI consulted for ongoing melena with hemoglobin drop. Patient states that he has had black stools intermittently for the past year after starting iron supplementation. Denies any hematochezia. Patient states that he has never been evaluated in the past for this ongoing anemia and attributes the anemia to his chronic kidney disease. Denies needing any blood transfusions, but does report getting iron infusions in November during a hospitalization. Per chart review, he had 1-2 formed black stools in the hospital. He is currently on aspirin and Ticagrelor after his stent on 12/23. Hgb down trended to 7.5 from 10 since admission.  GI recommended proceeding with EGD; performed on 12/30.      24 Hour Events/Subjective:   Had two loose stools recently. Denies any blood. Reviewed endoscopy report with patient and family; pathology pending. Denies abdominal pain and nausea.    REVIEW OF MEDICAL RECORDS  Past Medical History:    Atrial fibrillation (HCC)    CAD (coronary artery disease)    CHF (congestive heart failure) (HCC)    Coronary artery disease (CAD)    HX: anticoagulation    Hypercholesterolemia    Hypertension    Hypertriglyceridemia    Kidney disease    Obesity    Sleep apnea    SSS (sick sinus syndrome) (HCC)    Type II diabetes mellitus (HCC)     Surgical History:   Procedure Laterality Date    PACEMAKER PLACEMENT  09/30/2006    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    Performed by Kathreen Cornfield, MD at [facility] EP LAB    Removal Permanent Pacemaker N/A 01/17/2016 Performed by Kathreen Cornfield, MD at [facility] EP [facility]    BURR HOLE      CARDIAC CATHERIZATION      CORONARY STENT PLACEMENT      HX APPENDECTOMY       Social History     Socioeconomic History    Marital status: Married   Occupational History     Employer: [employer]     Comment: retired   Tobacco Use    Smoking status: Former     Current packs/day: 0.00     Average packs/day: 1 pack/day for 4.0 years (4.0 ttl pk-yrs)     Types: Cigarettes     Start date: 03/26/1962     Quit date: 03/26/1966     Years since quitting: 57.0    Smokeless tobacco: Never   Vaping Use    Vaping status: Never Used   Substance and Sexual Activity    Alcohol use: No     Comment: rarely    Drug use: Never    Sexual activity: Never     Allergies   Allergen Reactions    Ciprofloxacin RASH    Lisinopril SEE COMMENTS     hyperkalemia    Cefuroxime HIVES     Tolerated augmentin Aug 2024 and zosyn in Nov 2024    Clindamycin RASH    Plavix [Clopidogrel] RASH    Sulfa (Sulfonamide Antibiotics) HIVES    Quinine SEE COMMENTS  Allergy recorded in SMS: QUININE     Current Facility-Administered Medications   Medication Dose Route Frequency Provider Last Rate Last Admin    acetaminophen (TYLENOL) tablet 650 mg  650 mg Oral Q4H PRN Chancy Milroy, DO   650 mg at 03/21/23 0109    allopurinoL (ZYLOPRIM) tablet 100 mg  100 mg Oral QDAY Carder, Revonda Standard, DO   100 mg at 03/26/23 7829    aspirin chewable tablet 81 mg  81 mg Oral QDAY Roderic Palau, MD   81 mg at 03/26/23 0854    atorvastatin (LIPITOR) tablet 80 mg  80 mg Oral QHS Kreisel, Erin D, APRN-NP   80 mg at 03/25/23 2149    clotrimazole (LOTRIMIN) 1 % topical cream   Topical BID Beckie Busing, MD   Given at 03/25/23 2144    gabapentin (NEURONTIN) capsule 100 mg  100 mg Oral Nelva Bush, MD   100 mg at 03/25/23 2149    [Held by Provider] heparin (porcine) PF syringe 5,000 Units  5,000 Units Subcutaneous Q8H Beckie Busing, MD   5,000 Units at 03/20/23 2100    ketoconazole (NIZORAL) 2 % topical cream   Topical BID Elta Guadeloupe, DO   Given at 03/25/23 2145    levothyroxine (SYNTHROID) tablet 75 mcg  75 mcg Oral Prentice Docker, MD   75 mcg at 03/26/23 5621    metoprolol succinate XL (TOPROL XL) tablet 25 mg  25 mg Oral QDAY Pokhrel, Bidushi, MBBS   25 mg at 03/25/23 1222    ondansetron HCL (PF) (ZOFRAN (PF)) injection 4 mg  4 mg Intravenous Q6H PRN Roderic Palau, MD        pantoprazole DR (PROTONIX) tablet 40 mg  40 mg Oral BID(11-21) de Mammie Lorenzo, Meta Hatchet, MD   40 mg at 03/25/23 2149    polyethylene glycol 3350 (MIRALAX) packet 17 g  1 packet Oral QDAY Chancy Milroy, DO   17 g at 03/20/23 0901    pramipexole (MIRAPEX) tablet 0.75 mg  0.75 mg Oral QHS Chancy Milroy, DO   0.75 mg at 03/25/23 2149    sennosides-docusate sodium (SENOKOT-S) tablet 1 tablet  1 tablet Oral BID Chancy Milroy, DO   1 tablet at 03/23/23 3086    sodium chloride (SEA MIST) 0.65 % nasal spray 2 spray  2 spray Each Nostril QID Chancy Milroy, DO   2 spray at 03/25/23 2148    ticagrelor (BRILINTA) tablet 90 mg  90 mg Oral BID Roderic Palau, MD   90 mg at 03/26/23 5784     I personally reviewed past medical history, family history, and social history.     REVIEW OF SYSTEMS  HEENT: No loss of vision or bleeding gums   Cardiovascular: No chest pain    Respiratory: No wheezing   Gastrointestinal: See HPI above.     PHYSICAL EXAMINATION  Vital Signs: BP 96/48 (BP Source: Arm, Right Upper)  - Pulse 76  - Temp 36.5 ?C (97.7 ?F)  - Ht 177.8 cm (5' 10)  - Wt 98 kg (216 lb 0.8 oz)  - SpO2 98%  - BMI 31.00 kg/m?   Body mass index is 31 kg/m?Marland Kitchen  Physical Exam  Constitutional:       General: He is awake.   Cardiovascular:      Rate and Rhythm: Normal rate.   Pulmonary:      Effort: Pulmonary effort is normal. No respiratory distress.   Abdominal:  General: Bowel sounds are normal. There is no distension.      Palpations: Abdomen is soft.      Tenderness: There is no abdominal tenderness.   Neurological: Mental Status: He is alert.   Psychiatric:         Mood and Affect: Mood normal.         Speech: Speech normal.        RECENT LABS - Pertinent labs reviewed by me personally and abbreviated in HPI above.     RADIOLOGY - No pertinent imaging to review    GI PROCEDURES - I have personally reviewed the following pertinent GI procedure images and/or reports:     EGD (03/25/2023)  - Duodenitis could have been source of melena however no active bleeding or stigmata of recent bleed.   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   - Mucosal nodule found in the esophagus. Biopsied.   - Normal stomach.   - Erythematous duodenopathy.     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    Recommendations:  - Await pathology results (performing endoscopist to follow up pathology)   - Monitor for overt GI bleeding   - Continue PPI daily indefinitely  - May need repeat EGD pending pathology results while off of DAPT  - Trend H/H, transfuse pRBCs per protocol if Hgb <7gm/dl  - This patient will be arranged for a GI clinic follow up due to on-going needs of outpatient continuity of care.   GI panel: Durairaj/Bryant/Esfandyari  Time frame to be seen: 2 months  With whom hospital follow up will be scheduled:  Vela Prose, APP  Items to be addressed at follow-up: able to come off of DAPT for EGD if needed for BE and GEJ nodule   GI pending items at discharge: path from EGD    Total Time Today was 35 minutes in the following activities: Preparing to see the patient, Obtaining and/or reviewing separately obtained history, Performing a medically appropriate examination and/or evaluation, Counseling and educating the patient/family/caregiver, Documenting clinical information in the electronic or other health record, Independently interpreting results (not separately reported) and communicating results to the patient/family/caregiver, and Care coordination (not separately reported)     Patient discussed and plan agreed upon with Dr. Wynelle Beckmann. Thank you for this consultation. It has been a pleasure to be a part of the care team. Please feel free to contact the GI consult team with any questions or concerns.    Epifanio Lesches, MSN, APRN, FNP-C  [facility]  Division of Gastroenterology  Available via [place]  M-F [phone], otherwise contact GI fellow on call

## 2023-03-27 ENCOUNTER — Encounter: Admit: 2023-03-27 | Discharge: 2023-03-27 | Payer: MEDICARE

## 2023-03-28 ENCOUNTER — Encounter: Admit: 2023-03-28 | Discharge: 2023-03-28 | Payer: MEDICARE

## 2023-03-31 ENCOUNTER — Encounter: Admit: 2023-03-31 | Discharge: 2023-03-31 | Payer: MEDICARE

## 2023-04-01 NOTE — Progress Notes
Cardiovascular Medicine & Interventional Cardiology  Office Visit    Appointment Date: 04/04/2023    Paul Benjamin   [address]  [place] [address]     RE: Paul Benjamin  DOB: 1945/11/27  Visit provider: Winfred Leeds, APRN-NP    I had the pleasure of seeing Paul Benjamin in the office today. He is a(n) 78 y.o. 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 He also has a CV history notable for: HFpEF, SSS s/p dual chamber PPM (2008), afib s/p ablation (2009), HTN, DLP, and CAD s/p stenting x2 to LAD and prox diagonal branch (2005).      More recently, Paul Benjamin was admitted as a transfer to [facility] 12/23-12/31/2024 with presumed septic shock and urosepsis. Initially requiring pressor support for a couple of days. Urine cx grew pan sensitive Serratia marcescens.  Noted to have chest pain with Type I NSTEMI. Trop: 3 (OSH); 12,690 (admission); 14,792 (4 hours post admission). He was taken to cath lab on 12/23 with stenting x2 to the proximal RCA. Echo: moderately reduced LV systolic function with EF 30-35% (previously 56% 01/2023); new regional wall motion abnormalities (akinesis of inferoseptal and inferolateral walls and apical segments; hypokinesis of basal inferoseptal, basal inferior, mid anteroseptal, and mid inferior walls); moderately to severely dilated RV with mild RV systolic dysfunction; known bicuspid aortic valve Hospital course complicated by melena and epistaxis. EGD on 12/30 without evidence of bleeding. He was subsequently discharged on PTA Toprol & DAPT to a rehab facility.      He describes some left hand edema.  Notes that the facility has been giving him reduced lasix 20 mg over the past 4-5 days and holding his metoprolol due to soft pressures (SBP 90's).  He was asymptomatic. He  has a chronic foley which is emptied by the nursing staff and is unsure of the diuretic affect.    Paul Benjamin was discharged from rehab today and plans to go home and stay with his wife. He has otherwise being doing well. No abdominal bloating, PND or orthopnea. Systolic pressure 130's.  He was able to walk from the parking garage into our clinic today without stopping. He remains compliant on DAPT without missed doses and continues on Brilinta.  Paul Benjamin is without chest pain, or pressure. Patient reports no symptoms of dyspnea, or weight gain. Weight today standing was #216.  He has no concerns or pain at the access site and there is no hematoma, but some ecchymosis. Pulse intact. There are no signs of radiation dermatitis.  He is aware of  cardiac rehab, but does not have a start date.             Past Medical History:    Atrial fibrillation (HCC)    CAD (coronary artery disease)    CHF (congestive heart failure) (HCC)    Coronary artery disease (CAD)    HX: anticoagulation    Hypercholesterolemia    Hypertension    Hypertriglyceridemia    Kidney disease    Obesity    Sleep apnea    SSS (sick sinus syndrome) (HCC)    Type II diabetes mellitus (HCC)     Surgical History:   Procedure Laterality Date    PACEMAKER PLACEMENT  09/30/2006    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    Performed by Kathreen Cornfield, MD at [facility] EP LAB    Removal Permanent Pacemaker N/A 01/17/2016    Performed by Kathreen Cornfield, MD at [facility] EP LAB    ESOPHAGOGASTRODUODENOSCOPY WITH BIOPSY - FLEXIBLE N/A 03/25/2023    Performed by Meyer Cory, MD at [facility] ENDO    [facility]      CARDIAC CATHERIZATION      CORONARY STENT PLACEMENT      HX APPENDECTOMY       Final medications:   allopurinoL (ZYLOPRIM) 100 mg tablet Take one tablet by mouth daily. Take with food.  Indications: treatment to prevent acute gout attack    aspirin 81 mg chewable tablet Chew one tablet by mouth daily. Indications: treatment to prevent a heart attack    atorvastatin (LIPITOR) 40 mg tablet Take one tablet by mouth at bedtime daily. Indications: hardening of the arteries due to plaque buildup    blood-glucose sensor (FREESTYLE LIBRE 3 SENSOR) sensor device Use one each as directed daily.    CHOLEcalciferoL (vitamin D3) (OPTIMAL D3) 50,000 units capsule Take one capsule by mouth every 7 days. Wednesday.    clotrimazole (LOTRIMIN) 1 % topical cream Apply  topically to affected area twice daily. Indications: a skin infection due to the fungus Candida    docusate (COLACE) 100 mg capsule Take one capsule by mouth daily.    furosemide (LASIX) 20 mg tablet Take two tablets by mouth daily as needed (Weight yourself every morning and take it if weight gain of >5 lbs in 48 hrs). Indications: visible water retention    gabapentin (NEURONTIN) 100 mg capsule Take one capsule by mouth at bedtime daily.    insulin aspart (U-100) (NOVOLOG FLEXPEN U-100 INSULIN) 100 unit/mL (3 mL) PEN Inject zero Units to six Units under the skin three times daily with meals. Correction Factor if: BS 181-220=2 units, BS 221-260=4 units, BS 261-300=6 units, BS 301-350=8 units, BS 351-400=10 units, BS >400=12 units  Indications: type 2 diabetes mellitus    ketoconazole (NIZORAL) 2 % topical cream Apply  topically to affected area twice daily. Indications: a skin infection due to the fungus Candida    levothyroxine (SYNTHROID) 75 mcg tablet Take one tablet by mouth daily.    metoprolol succinate XL (TOPROL XL) 25 mg extended release tablet Take one tablet by mouth daily. Indications: myocardial reinfarction prevention (Patient not taking: Reported on 04/04/2023)    nitroglycerin (NITROSTAT) 0.4 mg tablet Place 1 tablet under tongue every 5 minutes as needed for Chest Pain.    pantoprazole DR (PROTONIX) 40 mg tablet Take one tablet by mouth twice daily. Indications: bleeding from stomach, esophagus or duodenum    polyethylene glycol 3350 (MIRALAX) 17 g packet Take one packet by mouth daily as needed (Constipation - Second Line). Indications: constipation    pramipexole (MIRAPEX) 0.75 mg tablet Take one tablet by mouth at bedtime daily. Indications: restless legs syndrome, an extreme discomfort in the calf muscles when sitting or lying down    semaglutide (OZEMPIC) 1 mg/dose (4 mg/3 mL) injection PEN Inject 1 mg under the skin every 7 days.    sennosides-docusate sodium (SENOKOT-S) 8.6/50 mg tablet Take one tablet by mouth daily as needed (Constipation - First Line). Indications: constipation    sodium chloride (SEA MIST) 0.65 % nasal spray Apply two sprays to each nostril as directed every 6 hours as needed. Indications: dryness of the nose  ticagrelor (BRILINTA) 90 mg tablet Take one tablet by mouth twice daily. Indications: blood clot prevention following percutaneous coronary intervention     Allergies   Allergen Reactions    Ciprofloxacin RASH    Lisinopril SEE COMMENTS     hyperkalemia    Cefuroxime HIVES     Tolerated augmentin Aug 2024 and zosyn in Nov 2024    Clindamycin RASH    Plavix [Clopidogrel] RASH    Sulfa (Sulfonamide Antibiotics) HIVES    Quinine SEE COMMENTS     Allergy recorded in SMS: QUININE     Family History   Problem Relation Name Age of Onset    Asthma Father Jomarie Longs     Cardiovascular Father Jomarie Longs     Hypertension Mother Malena Catholic     High Cholesterol Mother Optician, dispensing      Social History:  Social History     Tobacco Use    Smoking status: Former     Current packs/day: 0.00     Average packs/day: 1 pack/day for 4.0 years (4.0 ttl pk-yrs)     Types: Cigarettes     Start date: 03/26/1962     Quit date: 03/26/1966     Years since quitting: 57.0     Passive exposure: Never    Smokeless tobacco: Never   Vaping Use    Vaping status: Never Used   Substance Use Topics    Alcohol use: No     Comment: rarely    Drug use: Never     Review of Systems   Constitutional: Negative.   HENT: Negative.     Eyes: Negative.    Cardiovascular: Negative. Respiratory: Negative.     Endocrine: Negative.    Hematologic/Lymphatic: Negative.    Skin: Negative.    Musculoskeletal:         Left left upper arm edema.    Gastrointestinal: Negative.    Genitourinary: Negative.    Neurological: Negative.    Psychiatric/Behavioral: Negative.     Allergic/Immunologic: Negative.    All other systems reviewed and are negative.    Vitals:    04/04/23 0938   BP: 132/78   BP Source: Arm, Left Upper   Pulse: 80   O2 Device: CPAP/BiPAP   PainSc: Zero   Weight: 98 kg (216 lb)   Height: 175.3 cm (5' 9)     Body mass index is 31.9 kg/m?Marland Kitchen     Physical Exam  Vitals and nursing note reviewed.   Constitutional:       Appearance: Normal appearance.      Comments: Elderly and frail   HENT:      Head: Normocephalic and atraumatic.   Cardiovascular:      Rate and Rhythm: Normal rate and regular rhythm.      Pulses: Normal pulses.           Carotid pulses are 2+ on the right side and 2+ on the left side.     Heart sounds: Normal heart sounds, S1 normal and S2 normal.      Comments: 1+ lower leg edema.  L>R.  Trace left hand edema.   Pulmonary:      Effort: Pulmonary effort is normal.      Breath sounds: Normal breath sounds.   Abdominal:      Palpations: Abdomen is soft.   Genitourinary:     Comments: Chronic foley with leg bag  Musculoskeletal:         General: Normal range of motion.  Cervical back: Neck supple.   Skin:     General: Skin is warm and dry.      Capillary Refill: Capillary refill takes less than 2 seconds.   Neurological:      General: No focal deficit present.      Mental Status: He is alert and oriented to person, place, and time. Mental status is at baseline.   Psychiatric:         Mood and Affect: Mood normal.         Thought Content: Thought content normal.         Judgment: Judgment normal.        Lab Results   Component Value Date/Time    WBC 8.00 03/26/2023 04:26 AM    RBC 2.94 (L) 03/26/2023 04:26 AM    HGB 8.5 (L) 03/26/2023 04:26 AM    HCT 26.1 (L) 03/26/2023 04:26 AM    PLTCT 188 03/26/2023 04:26 AM     Lab Results   Component Value Date/Time    K 4.4 03/26/2023 04:26 AM    CR 2.64 (H) 03/26/2023 04:26 AM    AST 15 03/26/2023 04:26 AM    ALT 22 03/26/2023 04:26 AM     Lab Results   Component Value Date/Time    CHOL 76 03/19/2023 04:02 AM    TRIG 123 03/19/2023 04:02 AM    HDL 26 (L) 03/19/2023 04:02 AM    LDL 38 03/19/2023 04:02 AM       Encounter Diagnoses   Name Primary?    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 Type I NSTEMI/CAD  -- hx of prior LAD and prox D1 (2005) Doing well status angiogram with stenting x2 to the proximal RCA. Notes to have patent LAD and Prox D1 stents.    Patient is without angina and continues to do well from a CV standpoint.    -- Continue DAPT with Ticagrelor 90 mg twice daily X 12 months, and aspirin 81 mg indefinitely.  -- The patient has started cardiac rehab and ongoing participation encouraged.     2. HFrEF  -- Echo: moderately reduced LV systolic function with EF 30-35% (previously 56% 01/2023); new regional wall motion abnormalities   --  Weight # 216. Minimal decompensation with lower leg edema. Instructed to take lasix 40 mg X 2 days, then adjust to PRN daily for weight gain of 3 pounds.  -- GDMT: Toprol XL 25 mg, hold while he is taking lasix and for SBP< 110; Will continue to Augment GDMT as pressures allow. Avoiding ACE-I and ARB's due to CKD. Avoid SGLT2 inhibitor due to chronic indwelling catheter with history of complicated UTIs   -- I have ordered a repeat echocardiogram to be completed prior to his visit with Dr. Barry Dienes. I will see him back in 4 weeks for a weight check.     3. Dyslipidemia:  -- Goal LDL < 70 mg/dL. Lipids are at goal.   -- Continues on statin therapy with Atorvastatin 80 mg  -- Encouraged to reduce intake of saturated fats, an increase in fiber to lower LDL-C, limit  added sugars and processed carbohydrates to reduce triglycerides    -- Most recent lipid panel:   Lab Results   Component Value Date    LDL 38 03/19/2023    HDL 26 (L) 03/19/2023    CHOL 76 03/19/2023    TRIG 123 03/19/2023    AST 15 03/26/2023    ALT 22 03/26/2023      4. 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 Cardiac compass shows fairly significant increase in RV pacing over the last 1-2days     5. Chronic Kidney Disease  -- Contrast utilized during recent procedure at 150 ml.  -- BMP today.  Renal function post procedure stable without indication of contrast induced nephropathy.   Lab Results   Component Value Date/Time    GFRAA 35 (L) 12/03/2019 02:40 PM    GFR 24 (L) 03/26/2023 04:26 AM     Lab Results   Component Value Date/Time    K 4.4 03/26/2023 04:26 AM    BUN 50 (H) 03/26/2023 04:26 AM    CR 2.64 (H) 03/26/2023 04:26 AM    AST 15 03/26/2023 04:26 AM    ALT 22 03/26/2023 04:26 AM      7.Hx anemia of chronic disease  -- Baseline hemoglobin 9.5-10.5 with most recent Hgb at 8.5  --  CBC today with other lab work. EGD 12/30: Duodenitis could have been source of melena however no active bleeding or stigmata of recent bleed.     8. History of complicated UTIs   9. Recent admission for urosepsis.   -- He is not longer on antibiotics.   -- Will avoid SGLT2-I.         I have asked the patient to follow up with me in 4 weeks for a weight check.  This may be virtual.  He will see Dr. Gala Romney as scheduled in April. I am happy to see Paul Benjamin sooner should a need arise.     Treatment goals, progress and next steps, as above were discussed and mutually agreed upon with the patient/family. Thank you for allowing me to participate in [employer].  If I can be of any further assistance, please do not hesitate to contact me.    Sincerely,  Winfred Leeds, APRN-NP      Total Time Today was 30 minutes in the following activities: Obtaining and/or reviewing separately obtained history, Counseling and educating the patient/family/caregiver, Ordering medications, tests, or procedures, Documenting clinical information in the electronic or other health record, and Independently interpreting results (not separately reported) and communicating results to the patient/family/caregiver

## 2023-04-04 ENCOUNTER — Ambulatory Visit: Admit: 2023-04-04 | Discharge: 2023-04-04 | Payer: MEDICARE

## 2023-04-04 ENCOUNTER — Encounter: Admit: 2023-04-04 | Discharge: 2023-04-04 | Payer: MEDICARE

## 2023-04-04 DIAGNOSIS — E782 Mixed hyperlipidemia: Secondary | ICD-10-CM

## 2023-04-04 DIAGNOSIS — I2510 Atherosclerotic heart disease of native coronary artery without angina pectoris: Secondary | ICD-10-CM

## 2023-04-04 DIAGNOSIS — I255 Ischemic cardiomyopathy: Secondary | ICD-10-CM

## 2023-04-05 ENCOUNTER — Encounter: Admit: 2023-04-05 | Discharge: 2023-04-05 | Payer: MEDICARE

## 2023-04-05 DIAGNOSIS — N184 Chronic kidney disease, stage 4 (severe): Secondary | ICD-10-CM

## 2023-04-05 DIAGNOSIS — E1122 Type 2 diabetes mellitus with diabetic chronic kidney disease: Secondary | ICD-10-CM

## 2023-04-05 DIAGNOSIS — I5022 Chronic systolic (congestive) heart failure: Secondary | ICD-10-CM

## 2023-04-05 NOTE — Telephone Encounter
Spoke with Scot Jun, pt wife, regarding referral to begin cardiac rehab.  Pt lives in [place], [place] and it was confirmed that they would like referral sent to local facility.  Referral with records sent to [facility] cardiac rehab team today.

## 2023-04-06 ENCOUNTER — Encounter: Admit: 2023-04-06 | Discharge: 2023-04-06 | Payer: MEDICARE

## 2023-04-17 ENCOUNTER — Ambulatory Visit: Admit: 2023-04-17 | Discharge: 2023-04-17 | Payer: MEDICARE

## 2023-04-17 ENCOUNTER — Encounter: Admit: 2023-04-17 | Discharge: 2023-04-17 | Payer: MEDICARE

## 2023-04-17 DIAGNOSIS — N189 Chronic kidney disease, unspecified: Secondary | ICD-10-CM

## 2023-04-19 ENCOUNTER — Encounter: Admit: 2023-04-19 | Discharge: 2023-04-19 | Payer: MEDICARE

## 2023-04-23 ENCOUNTER — Encounter: Admit: 2023-04-23 | Discharge: 2023-04-23 | Payer: MEDICARE

## 2023-04-24 ENCOUNTER — Encounter: Admit: 2023-04-24 | Discharge: 2023-04-24 | Payer: MEDICARE

## 2023-04-25 ENCOUNTER — Encounter: Admit: 2023-04-25 | Discharge: 2023-04-25 | Payer: MEDICARE

## 2023-04-26 ENCOUNTER — Encounter: Admit: 2023-04-26 | Discharge: 2023-04-26 | Payer: MEDICARE

## 2023-04-28 ENCOUNTER — Encounter: Admit: 2023-04-28 | Discharge: 2023-04-28 | Payer: MEDICARE

## 2023-04-29 ENCOUNTER — Encounter: Admit: 2023-04-29 | Discharge: 2023-04-29 | Payer: MEDICARE

## 2023-05-02 ENCOUNTER — Encounter: Admit: 2023-05-02 | Discharge: 2023-05-02 | Payer: MEDICARE

## 2023-05-03 ENCOUNTER — Encounter: Admit: 2023-05-03 | Discharge: 2023-05-03 | Payer: MEDICARE

## 2023-05-03 MED ORDER — PANTOPRAZOLE 40 MG PO TBEC
40 mg | ORAL_TABLET | Freq: Two times a day (BID) | ORAL | 1 refills | 90.00000 days | Status: AC
Start: 2023-05-03 — End: ?

## 2023-05-05 NOTE — Progress Notes
Telehealth Visit Note    Date of Service: 05/06/2023    Subjective:    I had a very pleasant telephone visit with Paul Benjamin today for 1 month weight check. He was unable to do a video visit due technology challenges.      I saw him a month ago at which time his weights were stable at #216. Augmenting his HF GDMT has been limited by low blood pressures. He was asked to take lasix PRN for weight gain and monitor pressures and continue DAPT.  He saw Nephrology in late January and instructed to take diuretics and instructed to continued to take 40 mg daily. A BMP done 04/17/2023 showed stable renal function with a Cr 2.56.    Today he tells me that his weights have slowly dropped. His weigh this morning was #206.  He has had no lower leg edema and hand swelling.  Wife reports good urine output from his chronic foley.  He has had no fevers or chills.  She voices some concerns that his foley has not been changed for about 5-6 weeks now.  Pressure remain with SBP 105-110 which have limited further titration of HF medications.      History of Present Illness  Paul Benjamin is a 78 y.o. 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 He also has a CV history notable for: HFpEF, SSS s/p dual chamber PPM (2008), afib s/p ablation (2009), HTN, DLP, and CAD s/p stenting x2 to LAD and prox diagonal branch (2005).      In December he was admitted with presumed septic shock and urosepsis. Noted to have chest pain with NSTEMI I. Cardiac cath at that time with stenting x2 to the prox RCA. Echo: moderately reduced LV systolic function with EF 30-35% (previously 56% 01/2023); akinesis of inferoseptal and inferolateral walls and apical segments; hypokinesis of basal inferoseptal, basal inferior, mid anteroseptal, and mid inferior walls); moderately to severely dilated RV with mild RV systolic dysfunction; known bicuspid aortic valve. He was subsequently discharged to a rehab facility but has moved back home early January.          Objective:          allopurinoL (ZYLOPRIM) 100 mg tablet Take one tablet by mouth daily. Take with food.  Indications: treatment to prevent acute gout attack    aspirin 81 mg chewable tablet Chew one tablet by mouth daily. Indications: treatment to prevent a heart attack    atorvastatin (LIPITOR) 40 mg tablet Take one tablet by mouth at bedtime daily. Indications: hardening of the arteries due to plaque buildup    blood-glucose sensor (FREESTYLE LIBRE 3 SENSOR) sensor device Use one each as directed daily.    CHOLEcalciferoL (vitamin D3) (OPTIMAL D3) 50,000 units capsule Take one capsule by mouth every 7 days. Wednesday.    clotrimazole (LOTRIMIN) 1 % topical cream Apply  topically to affected area twice daily. Indications: a skin infection due to the fungus Candida    docusate (COLACE) 100 mg capsule Take one capsule by mouth daily.    furosemide (LASIX) 20 mg tablet Take two tablets by mouth twice daily. Indications: visible water retention    gabapentin (NEURONTIN) 100 mg capsule Take one capsule by mouth at bedtime daily.    ketoconazole (NIZORAL) 2 % topical cream Apply  topically to affected area twice daily. Indications: a skin infection  due to the fungus Candida    levothyroxine (SYNTHROID) 75 mcg tablet Take one tablet by mouth daily.    metoprolol succinate XL (TOPROL XL) 25 mg extended release tablet Take one tablet by mouth daily. Indications: myocardial reinfarction prevention    nitroglycerin (NITROSTAT) 0.4 mg tablet Place 1 tablet under tongue every 5 minutes as needed for Chest Pain.    pantoprazole DR (PROTONIX) 40 mg tablet Take one tablet by mouth twice daily. Indications: bleeding from stomach, esophagus or duodenum    polyethylene glycol 3350 (MIRALAX) 17 g packet Take one packet by mouth daily as needed (Constipation - Second Line). Indications: constipation    pramipexole (MIRAPEX) 0.75 mg tablet Take one tablet by mouth at bedtime daily. Indications: restless legs syndrome, an extreme discomfort in the calf muscles when sitting or lying down    semaglutide (OZEMPIC) 1 mg/dose (4 mg/3 mL) injection PEN Inject 1 mg under the skin every 7 days.    sennosides-docusate sodium (SENOKOT-S) 8.6/50 mg tablet Take one tablet by mouth daily as needed (Constipation - First Line). Indications: constipation    sodium chloride (SEA MIST) 0.65 % nasal spray Apply two sprays to each nostril as directed every 6 hours as needed. Indications: dryness of the nose    ticagrelor (BRILINTA) 90 mg tablet Take one tablet by mouth twice daily. Indications: blood clot prevention following percutaneous coronary intervention      Telehealth Patient Reported Vitals       Row Name 05/06/23 1354                Pain Score Zero                      Computed Telehealth Body Mass Index unavailable. One or more values for this score either were not found within the given timeframe or did not fit some other criterion.    Physical Exam       Assessment and Plan:  HFrEF  -- Echo: moderately reduced LV systolic function with EF 30-35% (previously 56% 01/2023); new regional wall motion abnormalities   --  Weight is #206 compared to last office weight # 216. Denies any symptoms of decompensation on the phone today. Instructed to take lasix 40 mg days as instructed by nephrology.   -- GDMT: Toprol XL 25 mg, hold for SBP< 105; Further optimization of GDMT limited by his soft blood pressures. Avoid ACE-I and ARB's due to CKD. Avoid SGLT2 inhibitor due to chronic indwelling catheter with history of complicated UTIs   -- A repeat echo pending to be done at the time of his visit with Dr. Barry Dienes.    CAD/NSTEMI  -- Prior LAD and prox D1 (2005)   -- He continues to do well status angiogram with stenting x2 to the prox RCA. Notes to have patent LAD and Prox D1 stents.    -- Patient is without angina and continues to do well from a CV standpoint.    -- He should remain on DAPT with Ticagrelor 90 mg twice daily X 12 months given his recent NSTEMI followed by aspirin 81 mg indefinitely. He voiced concerns about the price of Brilinta. Unfortunately he is allergic to Plavix.      CKD IV  -- Renal function stable. He is followed by nephrology.    -- At las visit instructed to increase lasix   -- Renal function stable with Cr in January at 2.56    Dyslipidemia:  -- Lipids remain at  goal on Atorvastatin 80 mg  -- Plan to repeat labs annually.     Device in situ  -- Dual chamber pacemaker  --  Enrolled in our device clinic.  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 Cardiac compass shows fairly significant increase in RV pacing over the last 1-2days      Hx anemia of chronic disease  -- Baseline hemoglobin 9.5-10.5 with most recent Hgb at 8.5  -- EGD 12/30: Duodenitis could have been source of melena however no active bleeding or stigmata of recent bleed.      History of complicated UTIs   Recent admission for urosepsis.   -- He is not longer on antibiotics.   -- Will avoid SGLT2-I.                     30 minutes spent on this patient's encounter with counseling and coordination of care taking >50% of the visit.

## 2023-05-06 ENCOUNTER — Ambulatory Visit: Admit: 2023-05-06 | Discharge: 2023-05-07 | Payer: MEDICARE

## 2023-05-06 ENCOUNTER — Encounter: Admit: 2023-05-06 | Discharge: 2023-05-06 | Payer: MEDICARE

## 2023-05-06 DIAGNOSIS — I10 Essential (primary) hypertension: Secondary | ICD-10-CM

## 2023-05-06 DIAGNOSIS — I2510 Atherosclerotic heart disease of native coronary artery without angina pectoris: Secondary | ICD-10-CM

## 2023-05-06 DIAGNOSIS — R579 Shock, unspecified: Secondary | ICD-10-CM

## 2023-05-06 DIAGNOSIS — E782 Mixed hyperlipidemia: Secondary | ICD-10-CM

## 2023-05-06 DIAGNOSIS — I214 Non-ST elevation (NSTEMI) myocardial infarction: Secondary | ICD-10-CM

## 2023-05-06 DIAGNOSIS — I255 Ischemic cardiomyopathy: Secondary | ICD-10-CM

## 2023-05-06 DIAGNOSIS — E1122 Type 2 diabetes mellitus with diabetic chronic kidney disease: Secondary | ICD-10-CM

## 2023-05-06 DIAGNOSIS — I495 Sick sinus syndrome: Secondary | ICD-10-CM

## 2023-05-09 ENCOUNTER — Encounter: Admit: 2023-05-09 | Discharge: 2023-05-09 | Payer: MEDICARE

## 2023-05-10 ENCOUNTER — Encounter: Admit: 2023-05-10 | Discharge: 2023-05-10 | Payer: MEDICARE

## 2023-05-15 ENCOUNTER — Encounter: Admit: 2023-05-15 | Discharge: 2023-05-15 | Payer: MEDICARE

## 2023-05-16 ENCOUNTER — Encounter: Admit: 2023-05-16 | Discharge: 2023-05-16 | Payer: MEDICARE

## 2023-05-20 ENCOUNTER — Encounter: Admit: 2023-05-20 | Discharge: 2023-05-20 | Payer: MEDICARE

## 2023-05-20 NOTE — Progress Notes
 Patient was scheduled for a [employer] Carelink on 05/13/23 that has not been received. Patient was instructed to send a manual transmission. Instructed if the transmitter does not appear to be working properly, they need to contact the device company directly. Patient was provided with that contact number. Requested the patient send [place] a MyChart message or contact our device nurses at [phone] to let us know after they have sent their transmission. Mychart sent to pt. BC      Note: Patient needs to send a manual transmission as we did not receive their scheduled remote interrogation.

## 2023-05-22 ENCOUNTER — Encounter: Admit: 2023-05-22 | Discharge: 2023-05-22 | Payer: MEDICARE

## 2023-05-22 MED ORDER — ATORVASTATIN 40 MG PO TAB
40 mg | ORAL_TABLET | Freq: Every evening | ORAL | 3 refills | Status: AC
Start: 2023-05-22 — End: ?

## 2023-05-23 ENCOUNTER — Encounter: Admit: 2023-05-23 | Discharge: 2023-05-23 | Payer: MEDICARE

## 2023-05-24 ENCOUNTER — Encounter: Admit: 2023-05-24 | Discharge: 2023-05-24 | Payer: MEDICARE

## 2023-05-24 ENCOUNTER — Encounter: Admit: 2023-05-24 | Discharge: 2023-05-25 | Payer: MEDICARE

## 2023-05-27 ENCOUNTER — Encounter: Admit: 2023-05-27 | Discharge: 2023-05-27 | Payer: MEDICARE

## 2023-05-30 ENCOUNTER — Ambulatory Visit: Admit: 2023-05-30 | Discharge: 2023-05-30 | Payer: MEDICARE

## 2023-05-30 ENCOUNTER — Encounter: Admit: 2023-05-30 | Discharge: 2023-05-30 | Payer: MEDICARE

## 2023-05-30 DIAGNOSIS — D508 Other iron deficiency anemias: Secondary | ICD-10-CM

## 2023-05-30 DIAGNOSIS — R809 Proteinuria, unspecified: Secondary | ICD-10-CM

## 2023-05-30 DIAGNOSIS — Q612 Polycystic kidney, adult type: Secondary | ICD-10-CM

## 2023-05-30 DIAGNOSIS — E559 Vitamin D deficiency, unspecified: Secondary | ICD-10-CM

## 2023-05-30 DIAGNOSIS — N184 Chronic kidney disease, stage 4 (severe): Secondary | ICD-10-CM

## 2023-05-30 NOTE — Patient Instructions
 Labs (order placed) Please stop by the lab today on your way out.  Please check BP and notify us if it is > 140 on top consistently.  Check daily weight at home and log findings.    Encouraged hydrate with 50-60 ounces of water daily.  Remember to avoid NSAIDs and contrast.  Recommend following low sodium diet, 2000 mg/day or less.   Nutrition and CKD:  https://www.kidney.org/sites/default/files/nutrition_and_ckd.pdf   Contact Nurse Kristi with any questions or concerns; she can be reached at [phone].   Follow up apt. 3 months with Dr. Philomena Course

## 2023-05-30 NOTE — Progress Notes
 Nephrology Progress Note    Note to patient: The 21st Century Cures Act makes medical notes like these available to patients in the interest of transparency. However, be advised this is a medical document. It is intended as peer to peer communication. It is written in medical language and may contain abbreviations or verbiage that are unfamiliar. It may appear blunt or direct. Medical documents are intended to carry relevant information, facts as evident, and the clinical opinion of the practitioner.       05/30/23      Paul Benjamin  [id number]  [birth year]        I had the pleasure of seeing Paul Benjamin in our [facility] for follow up of CKD stage 4. Last office visit was on 04/17/23 with Dr. Philomena Course.       History          CC: CKD Follow Up    HPI: Paul Benjamin is a 78 y.o. 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  He used to follow-up with Dr.Qasim and was last seen in March 2023 prior to initiating care with Dr. Philomena Course.      Interval History:  Since our last visit there have been no ER visits, hospitalizations, or serious illnesses.  He reports falling off stool onto his knees yesterday at home.  He did not seek medical care.  He reports being sore today.    EMR reviewed.           Allergies   Allergen Reactions    Ciprofloxacin RASH    Lisinopril SEE COMMENTS     hyperkalemia    Cefuroxime HIVES     Tolerated augmentin Aug 2024 and zosyn in Nov 2024    Clindamycin RASH    Plavix [Clopidogrel] RASH    Sulfa (Sulfonamide Antibiotics) HIVES    Quinine SEE COMMENTS     Allergy recorded in SMS: QUININE       Past Medical History:  Past Medical History:    Arthritis    Atrial fibrillation (HCC)    CAD (coronary artery disease)    CHF (congestive heart failure) (HCC)    Coronary artery disease (CAD)    HX: anticoagulation    Hypercholesterolemia    Hypertension    Hypertriglyceridemia Infection    Kidney disease    Myocardial infarction (HCC)    Obesity    Sleep apnea    SSS (sick sinus syndrome) (HCC)    Type II diabetes mellitus (HCC)    Unspecified deficiency anemia       Social History     Socioeconomic History    Marital status: Married   Occupational History     Employer: [employer]     Comment: retired   Tobacco Use    Smoking status: Former     Current packs/day: 0.00     Average packs/day: 1 pack/day for 57.0 years (57.0 ttl pk-yrs)     Types: Cigarettes     Start date: 03/26/1962     Quit date: 03/26/1966     Years since quitting: 57.2     Passive exposure: Never    Smokeless tobacco: Never   Vaping Use    Vaping status: Never Used   Substance and Sexual Activity    Alcohol use: Never     Comment: rarely    Drug use: Never    Sexual activity: Never       Surgical History:  Procedure Laterality Date    PACEMAKER PLACEMENT  09/30/2006    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    Performed by Kathreen Cornfield, MD at [facility] EP LAB    Removal Permanent Pacemaker N/A 01/17/2016    Performed by Kathreen Cornfield, MD at [facility] EP LAB    ESOPHAGOGASTRODUODENOSCOPY WITH BIOPSY - FLEXIBLE N/A 03/25/2023    Performed by Meyer Cory, MD at [facility] ENDO    [facility]      CARDIAC CATHERIZATION  03/19/2023    CORONARY ANGIOPLASTY  03/18/2023    CORONARY STENT PLACEMENT      HX APPENDECTOMY         Family History   Problem Relation Name Age of Onset    Asthma Father Paul Benjamin     Cardiovascular Father Paul Benjamin     Hypertension Mother Paul Benjamin     High Cholesterol Mother Paul Benjamin          Subjective:  Pt. reports feeling well tired.  He states that he is getting shots at [facility].  He has been taking oral iron.  Denies any CP, SOA, or HA.  He does have LE edema L > R.  Reports having foley in place and scheduled to see urology to discuss transition to suprapubic catheter with Dr. Gomez Cleverly next week.  He states that he is not very hungry.  He continues to have no taste and very little smell since COVID in 2020.  He reports decreased appetite without nausea or vomiting.  Denies any diarrhea.  He has been holding metoprolol at times r/t instructions from cardiology with hold parameters for BP < 115/60.  He and his wife may be moving in with their daughter this summer for more assistance.      Comprehensive ROS:  General: As Above  Eyes: negative  Ears, nose, mouth, throat, and face: negative  Respiratory: As above  Cardiovascular: As above  Gastrointestinal: negative  Genitourinary: As above  Integument: negative  Hematologic/lymphatic: negative  Musculoskeletal: negative  Neurological: negative  Endocrine: negative        Current Medications:   allopurinoL (ZYLOPRIM) 100 mg tablet Take one tablet by mouth daily. Take with food.  Indications: treatment to prevent acute gout attack    aspirin 81 mg chewable tablet Chew one tablet by mouth daily. Indications: treatment to prevent a heart attack    atorvastatin (LIPITOR) 40 mg tablet Take one tablet by mouth at bedtime daily. Indications: hardening of the arteries due to plaque buildup    blood-glucose sensor (FREESTYLE LIBRE 3 SENSOR) sensor device Use one each as directed daily.    CHOLEcalciferoL (vitamin D3) (OPTIMAL D3) 50,000 units capsule Take one capsule by mouth every 7 days. Wednesday.    clotrimazole (LOTRIMIN) 1 % topical cream Apply  topically to affected area twice daily. Indications: a skin infection due to the fungus Candida    docusate (COLACE) 100 mg capsule Take one capsule by mouth daily.    furosemide (LASIX) 20 mg tablet Take two tablets by mouth twice daily. Indications: visible water retention    gabapentin (NEURONTIN) 100 mg capsule Take one capsule by mouth at bedtime daily.    ketoconazole (NIZORAL) 2 % topical cream Apply  topically to affected area twice daily. Indications: a skin infection due to the fungus Candida    levothyroxine (SYNTHROID) 75 mcg tablet Take one tablet by mouth daily.    metoprolol succinate XL (TOPROL XL) 25 mg extended release tablet Take one tablet by mouth  daily. Indications: myocardial reinfarction prevention    nitroglycerin (NITROSTAT) 0.4 mg tablet Place 1 tablet under tongue every 5 minutes as needed for Chest Pain.    pantoprazole DR (PROTONIX) 40 mg tablet Take one tablet by mouth twice daily. Indications: bleeding from stomach, esophagus or duodenum    polyethylene glycol 3350 (MIRALAX) 17 g packet Take one packet by mouth daily as needed (Constipation - Second Line). Indications: constipation    pramipexole (MIRAPEX) 0.75 mg tablet Take one tablet by mouth at bedtime daily. Indications: restless legs syndrome, an extreme discomfort in the calf muscles when sitting or lying down    prasugreL (EFFIENT) 10 mg tablet Take one tablet by mouth daily.    semaglutide (OZEMPIC) 1 mg/dose (4 mg/3 mL) injection PEN Inject 1 mg under the skin every 7 days.    sennosides-docusate sodium (SENOKOT-S) 8.6/50 mg tablet Take one tablet by mouth daily as needed (Constipation - First Line). Indications: constipation    sodium chloride (SEA MIST) 0.65 % nasal spray Apply two sprays to each nostril as directed every 6 hours as needed. Indications: dryness of the nose    ticagrelor (BRILINTA) 90 mg tablet Take one tablet by mouth twice daily. Indications: blood clot prevention following percutaneous coronary intervention            Physical      Vitals:    05/30/23 1447   BP: 120/56   BP Source: Arm, Right Upper   Pulse: 70   PainSc: Eight   Weight: 95.9 kg (211 lb 8 oz)   Height: 175.3 cm (5' 9)     Body mass index is 31.23 kg/m?Marland Kitchen  General: A+Ox3, calm, conversant, and in no acute distress.  HEENT: Normocephalic, atraumatic.  No scleral icterus.   Neck:  Supple, Normal ROM, no JVD  Lungs: CTA upper and lower lobes bilateral  Abdomen:  Soft, N/D, non tender to palpation  Heart: RRR, S1, S2, without carotid bruit.  No murmurs, rubs, or gallops appreciated.  Neuro:  No confusion, answers questions appropriately.  No tremors  M/S: Moving all extremities.  Ext: 1+ LLE and trace RLE edema  Psych: Mood stable with normal affect.       Laboratory studies: I have reviewed available lab results.     Assessment and Plan         Paul Benjamin is a 78 y.o. male is here for follow up of CKD.       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  - CKD education completed 01/18/23 - pt. with plans for home HD when needed.  - Reviewed renal function with current lab review.   - Will reassess labs today.    ADPKD:   - The use of tolvaptan has not been studied in  this age group and at this eGFR.  - He underwent MRA of the brain in May 2023 without evidence of cerebral aneurysm.    Urinary Obstruction:  - With chronic indwelling foley catheter.  - Schedule with Dr. Gomez Cleverly next week for f/u.  - Reports they will discuss suprapubic urinary catheter.    HTN:  - Goal BP 110-130/80  - Currently taking: lasix, metoprolol  * Overall at goal, will continue with current antihypertensive regimen at this time.  Diabetes 2:   - Managed by PCP  - Discussed importance of good control of blood sugars to slow progression of kidney disease.  Goal AIC is < 6%.    Iron Deficiency Anemia:  - Goal Hgb > 10  - Currently not at goal in spite of ESA and or iron therapy.    - Iron therapy:  Oral iron   - Will reassess iron studies.    - Low threshold to add IV iron as he is not responding with ESA and oral iron.    CKD - MBD:  - Reviewed labs:  PTH, PO4, Vitamin D OH 25 will assess.   - Discussed bone minerals and the role the kidneys play in keeping them in balance.    Vitamin D Deficiency:  - Currently on supplementation    HLD  - On statin  - Managed by PCP       Plan:  * Will check labs today   * Encouraged pt to check BP and log the findings; notify us if it is > 140 on top consistently.  * Discussed referral to vascular surgery depending on eGFR.  When it is < 20 regularly will refer for eval of AVF.  * Encouraged pt. to hydrate with at least 50 - 60 ounces of water daily.  * Reviewed that pt. is to avoid NSAIDs and contrast.  * Encouraged pt. to follow low sodium diet, 2000 mg/day or less.   * Discussed current stage of kidney disease and optimal management of blood pressure, blood sugars, hydration, and avoiding nephrotoxic agents to try to slow down the progression of kidney disease.    * Follow up apt. 3 months with Dr. Philomena Course.        If you have any questions or concerns in the care of this patient please do not hesitate to contact myself or Dr. Philomena Course who manages patient plan of care.      Thank you,        Inda Coke, APRN, NP-C  Nephrology Nurse Practitioner    Total time today 56 minutes in the following activities:   Preparing to see the patient  Obtaining and/or reviewing separately obtained history  Performing a medically appropriate examination and/or evaluation  Counseling and educating the patient/family/caregiver  Ordering medications, tests, or procedures  Referring and communication with other health care professionals (when not separately reported)   Documenting clinical information in the electronic or other health record

## 2023-05-31 ENCOUNTER — Encounter: Admit: 2023-05-31 | Discharge: 2023-05-31 | Payer: MEDICARE

## 2023-05-31 DIAGNOSIS — M899 Disorder of bone, unspecified: Secondary | ICD-10-CM

## 2023-06-03 ENCOUNTER — Encounter: Admit: 2023-06-03 | Discharge: 2023-06-03 | Payer: MEDICARE

## 2023-06-03 ENCOUNTER — Ambulatory Visit: Admit: 2023-06-03 | Discharge: 2023-06-03 | Payer: MEDICARE

## 2023-06-04 ENCOUNTER — Encounter: Admit: 2023-06-04 | Discharge: 2023-06-04 | Payer: MEDICARE

## 2023-06-04 DIAGNOSIS — D508 Other iron deficiency anemias: Secondary | ICD-10-CM

## 2023-06-05 ENCOUNTER — Ambulatory Visit: Admit: 2023-06-05 | Discharge: 2023-06-06 | Payer: MEDICARE

## 2023-06-05 ENCOUNTER — Encounter: Admit: 2023-06-05 | Discharge: 2023-06-05 | Payer: MEDICARE

## 2023-06-12 ENCOUNTER — Encounter: Admit: 2023-06-12 | Discharge: 2023-06-12 | Payer: MEDICARE

## 2023-06-13 ENCOUNTER — Encounter: Admit: 2023-06-13 | Discharge: 2023-06-13 | Payer: MEDICARE

## 2023-06-13 NOTE — Telephone Encounter
 Spoke with Gunnar Fusi at Dr. Clifton James dentistry office [phone]). Informed patient is cleared to have a dental exam with cleaning at this time.

## 2023-06-18 ENCOUNTER — Encounter: Admit: 2023-06-18 | Discharge: 2023-06-18 | Payer: MEDICARE

## 2023-06-18 NOTE — Telephone Encounter
Fax received from [employer] [facility] for attachment  Filled out and placed in [employer] for review and signature

## 2023-06-24 ENCOUNTER — Encounter: Admit: 2023-06-24 | Discharge: 2023-06-24

## 2023-06-25 ENCOUNTER — Encounter: Admit: 2023-06-25 | Discharge: 2023-06-25

## 2023-06-26 ENCOUNTER — Encounter: Admit: 2023-06-26 | Discharge: 2023-06-26

## 2023-06-28 ENCOUNTER — Encounter: Admit: 2023-06-28 | Discharge: 2023-06-28

## 2023-07-01 ENCOUNTER — Encounter: Admit: 2023-07-01 | Discharge: 2023-07-01

## 2023-07-01 NOTE — Assessment & Plan Note
 No apparent recurrence after ablation procedure in 2009.  Maintained on aspirin.

## 2023-07-01 NOTE — Assessment & Plan Note
 Metoprolol XL 25/d

## 2023-07-01 NOTE — Assessment & Plan Note
 Lab Results   Component Value Date    CHOL 76 03/19/2023    TRIG 123 03/19/2023    HDL 26 (L) 03/19/2023    LDL 38 03/19/2023    VLDL 16.1 03/19/2023    NONHDLCHOL 50 03/19/2023    CHOLHDLC 4 06/05/2021      Atorva 40/day.

## 2023-07-01 NOTE — Assessment & Plan Note
 Last device check late February, 2025, showed about 30% RA pacing, negligible RV pacing, 3 years battery longevity.  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

## 2023-07-02 ENCOUNTER — Encounter: Admit: 2023-07-02 | Discharge: 2023-07-02

## 2023-07-02 ENCOUNTER — Ambulatory Visit: Admit: 2023-07-02 | Discharge: 2023-07-03

## 2023-07-02 DIAGNOSIS — E118 Type 2 diabetes mellitus with unspecified complications: Secondary | ICD-10-CM

## 2023-07-02 DIAGNOSIS — I10 Essential (primary) hypertension: Secondary | ICD-10-CM

## 2023-07-02 DIAGNOSIS — I2510 Atherosclerotic heart disease of native coronary artery without angina pectoris: Secondary | ICD-10-CM

## 2023-07-02 DIAGNOSIS — E1169 Type 2 diabetes mellitus with other specified complication: Secondary | ICD-10-CM

## 2023-07-02 DIAGNOSIS — I5032 Chronic diastolic (congestive) heart failure: Secondary | ICD-10-CM

## 2023-07-02 DIAGNOSIS — G4733 Obstructive sleep apnea (adult) (pediatric): Secondary | ICD-10-CM

## 2023-07-02 DIAGNOSIS — E7800 Pure hypercholesterolemia, unspecified: Secondary | ICD-10-CM

## 2023-07-02 DIAGNOSIS — I495 Sick sinus syndrome: Secondary | ICD-10-CM

## 2023-07-02 DIAGNOSIS — E1122 Type 2 diabetes mellitus with diabetic chronic kidney disease: Secondary | ICD-10-CM

## 2023-07-02 DIAGNOSIS — I480 Paroxysmal atrial fibrillation: Secondary | ICD-10-CM

## 2023-07-02 NOTE — Progress Notes
 Date of Service: 07/02/2023    Paul Benjamin Dallan Schonberg is a 78 y.o. male.       HPI     Paul Benjamin was in the [place] clinic today, accompanied by his wife.  He is followed for coronary disease, atrial fibrillation, and diastolic heart failure.  He had previously been a patient of Dr. Kerin Pebbles and I saw him for the first time last September, 2022.    Back in December his Home Health nurse determined that his BP was low.  He was referred to the ED and he developed chest tightness en route.  His troponin was elevated and he was transported down to [place].  NSTEMI was diagnosed and he underwent RCA stenting.  He's participating in Phase 2 Cardiac Rehab here at [facility].  He'll need to remain on prasugrel (Effient) through December, 2025.     He uses a walker and does not get out a lot in the winter months.  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    His diastolic heart failure appears to be pretty stable on the current medical program.  His exertional breathlessness has not worsened and his weight remains quite stable.  He has CKD and his nephrologist is at [facility].     His atrial fibrillation was never symptomatic but he has a pacemaker and there is been no evidence of recurrent arrhythmia after his ablation procedure.     He is not having any problems with TIA or stroke symptoms.  The decision had been made previously to discontinue oral anticoagulation after his successful ablation procedure and based on the lack of recurrence evidenced by device checks.         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         a.  Recurrent bouts of atrial fibrillation but completely asymptomatic.  3/09:  Hospital admission for sotalol initiation and cardioversion  09/23/07 A fib ablation:  Pulmonary vein antral isolation, ligament of marshall ablation, posterior roof line, cavotricuspid isthmus ablation  6/09 event monitor:  No recurrent AF      Tachy-brady syndrome (CMS-HCC) 09/30/2006     8/08 s/p dual chamber PPM implant.  Implanted device is a MDT Adapta [id number]      CAD (coronary artery disease) 09/30/2006     01/21/04 Heart catheterization - Angioplasty and stenting of the anterior descending coronary artery on 01/21/04 with a Cypher drug-eluting stent used for the LAD. The right coronary and circumflex showed only minimal disease. The ejection fraction was 45-50%.   07/18/2004:  Thallium showing EF of 53% with minimal amount of ischemia involving the               inferolateral segment.  3/09 adenosine thallium stress:  This study suggests a low likelihood for ischemic/jeopardized myocardium. No definite fixed or reversible defects are evident. High risk scintigraphic features are absent. Although the calculated LV ejection fraction was reduced, by visual estimation, the EF would appear at the lower limits of normal. The left ventricle is within normal limits for size.     7/11 thallium:  SUMMARY/OPINION: This study is probably normal. There is a small defect involving the inferior wall. The majority of the defect is fixed while more distally there is a subtle degree of reversibility. Overall I think this probably represents diaphragmatic attenuation though a very slight degree of ischemia cannot be completely excluded. The left ventricular systolic function is normal with a calculated ejection fraction of 61 percent. The end diastolic volume is normal at 100 mL. The pulmonary to myocardial count ratio is normal at 0.36. There is no transient ischemic dilatation.   This study was compared to a myocardial perfusion study obtained in our office on May 26, 2007. The prior study demonstrated an EF of 43 percent though visually it was felt to be in more of the 50-55 percent range. The prior study also demonstrated a perfusion abnormality involving the inferior wall which had some mild reversibility but overall was felt to be due to diaphragmatic attenuation.   In comparing the two studies qualitatively, both demonstrated an inferior wall defect. The prior study had more reversibility involving the basal to mid section while the current study has some mild reversibility involving the distal section. Overall I think both studies probably represent diaphragmatic attenuation.     05/03/2015 - Stress Test:    This study is probably normal with inferior attenuation with a small sized mild intensity reversible defect in the basal inferior wall consistent with  diaphragmatic attenuation. Left ventricular systolic function is normal. There are no high risk prognostic indicators present.  The ECG portion of the study is negative for ischemia.    02/2023 - NSTEMI with RCA  stent          Hypertension 09/30/2006    Mixed dyslipidemia 09/30/2006    OSA (obstructive sleep apnea) 09/30/2006         Review of Systems   Constitutional: Positive for malaise/fatigue.   HENT: Negative.     Eyes: Negative.    Cardiovascular: Negative.    Respiratory: Negative.     Endocrine: Negative.    Hematologic/Lymphatic: Negative.    Skin: Negative.    Musculoskeletal: Negative.    Gastrointestinal: Negative.    Genitourinary: Negative.    Neurological: Negative.    Psychiatric/Behavioral: Negative.     Allergic/Immunologic: Negative.        Physical Exam    Physical Exam   General Appearance: no distress   Skin: warm, no ulcers or xanthomas   Digits and Nails: no cyanosis or clubbing   Eyes: conjunctivae and lids normal, pupils are equal and round   Teeth/Gums/Palate: dentition unremarkable, no lesions   Lips & Oral Mucosa: no pallor or cyanosis   Neck Veins: normal JVP , neck veins are not distended   Thyroid: no nodules, masses, tenderness or enlargement   Chest Inspection: chest is normal in appearance   Respiratory Effort: breathing comfortably, no respiratory distress   Auscultation/Percussion: lungs clear to auscultation, no rales or rhonchi, no wheezing   PMI: PMI not enlarged or displaced   Cardiac Rhythm: regular rhythm and normal rate Cardiac Auscultation: S1, S2 normal, no rub, no gallop   Murmurs: no murmur   Peripheral Circulation: normal peripheral circulation   Carotid Arteries: normal carotid upstroke bilaterally, no bruits   Radial Arteries: normal symmetric radial pulses   Abdominal Aorta: no abdominal aortic bruit   Pedal Pulses: normal symmetric pedal pulses   Lower Extremity Edema: no lower extremity edema   Abdominal Exam: soft, non-tender, no masses, bowel sounds normal   Liver & Spleen: no organomegaly   Gait & Station: walks with a walker  Muscle Strength: normal muscle tone   Orientation: oriented to time, place and person   Affect & Mood: appropriate and sustained affect   Language and Memory: patient responsive and seems to comprehend information   Neurologic Exam: neurological assessment grossly intact   Other: moves all extremities      Cardiovascular Health Factors  Vitals BP Readings from Last 3 Encounters:   07/02/23 (!) 144/75   06/26/23 (!) 140/70   06/12/23 (!) 142/79     Wt Readings from Last 3 Encounters:   07/02/23 99.9 kg (220 lb 3.2 oz)   06/26/23 98.9 kg (218 lb)   06/05/23 95.7 kg (211 lb)     BMI Readings from Last 3 Encounters:   07/02/23 32.50 kg/m?   06/26/23 31.28 kg/m?   06/12/23 30.28 kg/m?      Smoking Social History     Tobacco Use   Smoking Status Former    Current packs/day: 0.00    Average packs/day: 1 pack/day for 57.0 years (57.0 ttl pk-yrs)    Types: Cigarettes    Start date: 03/26/1962    Quit date: 03/26/1966    Years since quitting: 57.3    Passive exposure: Never   Smokeless Tobacco Never      Lipid Profile Cholesterol   Date Value Ref Range Status   03/19/2023 76 <200 mg/dL Final     HDL   Date Value Ref Range Status   03/19/2023 26 (L) >40 mg/dL Final     LDL   Date Value Ref  Range Status   03/19/2023 38 <100 mg/dL Final     Triglycerides   Date Value Ref Range Status   03/19/2023 123 <150 mg/dL Final      Blood Sugar Hemoglobin A1C   Date Value Ref Range Status   03/19/2023 5.8 (H) 4.0 - 5.7 % Final     Comment:     The ADA recommends that most patients with type 1 and type 2 diabetes maintain an A1c level <7%.     Glucose   Date Value Ref Range Status   06/28/2023 135 (H) 70 - 105 Final   05/30/2023 157 (H) 70 - 100 mg/dL Final   95/28/4132 440 (H) 70 - 100 mg/dL Final     Glucose, POC   Date Value Ref Range Status   03/26/2023 187 (H) 70 - 100 mg/dL Final   01/20/2535 644 (H) 70 - 100 mg/dL Final   03/47/4259 563 (H) 70 - 100 mg/dL Final          Problems Addressed Today  Encounter Diagnoses   Name Primary?    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  Maintained on aspirin.    CAD (coronary artery disease)  NSTEMI 02/2023 with RCA PCI.  Effient until December, 2025.    Hypertension  Metoprolol XL 25/d    SSS (sick sinus syndrome) (CMS-HCC)  Last device check late February, 2025, showed about 30% RA pacing, negligible RV pacing, 3 years battery longevity.  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    Dyslipidemia associated with type 2 diabetes mellitus (CMS-HCC)  Lab Results   Component Value Date    CHOL 76 03/19/2023    TRIG 123 03/19/2023    HDL 26 (L) 03/19/2023    LDL 38 03/19/2023    VLDL 87.5 03/19/2023    NONHDLCHOL 50 03/19/2023    CHOLHDLC 4 06/05/2021      Atorva 40/day.    OSA (obstructive sleep apnea)  He follows at [facility].  He's moderately compliant with CPAP.  He usually gets up to sleep in a recliner for half the night and doesn't use CPAP in the chair.    Current Medications (including today's revisions)   allopurinoL (ZYLOPRIM) 100 mg tablet Take one tablet by mouth daily. Take with food.  Indications: treatment to prevent acute gout attack    aspirin 81 mg chewable tablet Chew one tablet by mouth daily. Indications: treatment to prevent a heart attack    atorvastatin (LIPITOR) 40 mg tablet Take one tablet by mouth at bedtime daily. Indications: hardening of the arteries due to plaque buildup    blood-glucose sensor (FREESTYLE LIBRE 3 SENSOR) sensor device Use one  each as directed daily.    CHOLEcalciferoL (vitamin D3) (OPTIMAL D3) 50,000 units capsule Take one capsule by mouth every 7 days. Wednesday.    clotrimazole (LOTRIMIN) 1 % topical cream Apply  topically to affected area twice daily. Indications: a skin infection due to the fungus Candida    docusate (COLACE) 100 mg capsule Take one capsule by mouth daily.    furosemide (LASIX) 20 mg tablet Take two tablets by mouth twice daily. Indications: visible water retention    gabapentin (NEURONTIN) 100 mg capsule Take one capsule by mouth at bedtime daily.    ketoconazole (NIZORAL) 2 % topical cream Apply  topically to affected area twice daily. Indications: a skin infection due to the fungus Candida    levothyroxine (SYNTHROID) 75 mcg tablet Take one tablet by mouth daily.    metoprolol succinate XL (TOPROL XL) 25 mg extended release tablet Take one tablet by mouth daily. Indications: myocardial reinfarction prevention    nitroglycerin (NITROSTAT) 0.4 mg tablet Place 1 tablet under tongue every 5 minutes as needed for Chest Pain.    pantoprazole DR (PROTONIX) 40 mg tablet Take one tablet by mouth twice daily. Indications: bleeding from stomach, esophagus or duodenum    polyethylene glycol 3350 (MIRALAX) 17 g packet Take one packet by mouth daily as needed (Constipation - Second Line). Indications: constipation    pramipexole (MIRAPEX) 0.75 mg tablet Take one tablet by mouth at bedtime daily. Indications: restless legs syndrome, an extreme discomfort in the calf muscles when sitting or lying down    prasugreL (EFFIENT) 10 mg tablet Take one tablet by mouth daily.    semaglutide (OZEMPIC) 1 mg/dose (4 mg/3 mL) injection PEN Inject 1 mg under the skin every 7 days.    sennosides-docusate sodium (SENOKOT-S) 8.6/50 mg tablet Take one tablet by mouth daily as needed (Constipation - First Line). Indications: constipation    sodium chloride (SEA MIST) 0.65 % nasal spray Apply two sprays to each nostril as directed every 6 hours as needed. Indications: dryness of the nose     Total time spent on today's office visit was 45 minutes.  This includes face-to-face in person visit with patient as well as nonface-to-face time including review of the EMR, outside records, labs, radiologic studies, echocardiogram & other cardiovascular studies, formation of treatment plan, after visit summary, future disposition, and lastly on documentation.

## 2023-07-02 NOTE — Assessment & Plan Note
 He follows at the [facility].  He's moderately compliant with CPAP.  He usually gets up to sleep in a recliner for half the night and doesn't use CPAP in the chair.

## 2023-07-05 ENCOUNTER — Encounter: Admit: 2023-07-05 | Discharge: 2023-07-05 | Payer: MEDICARE

## 2023-07-10 ENCOUNTER — Ambulatory Visit: Admit: 2023-07-10 | Discharge: 2023-07-11 | Payer: MEDICARE

## 2023-07-10 ENCOUNTER — Encounter: Admit: 2023-07-10 | Discharge: 2023-07-10 | Payer: MEDICARE

## 2023-07-12 ENCOUNTER — Encounter: Admit: 2023-07-12 | Discharge: 2023-07-12 | Payer: MEDICARE

## 2023-07-15 ENCOUNTER — Encounter: Admit: 2023-07-15 | Discharge: 2023-07-15 | Payer: MEDICARE

## 2023-07-15 MED ORDER — FUROSEMIDE 20 MG PO TAB
ORAL_TABLET | ORAL | 3 refills | 90.00000 days | Status: AC
Start: 2023-07-15 — End: ?

## 2023-07-15 NOTE — Telephone Encounter
 Received refill request for furosemide . Last visit 05/30/23, upcoming appt 10/02/23. Refilled per protocol

## 2023-07-18 ENCOUNTER — Encounter: Admit: 2023-07-18 | Discharge: 2023-07-18 | Payer: MEDICARE

## 2023-07-26 ENCOUNTER — Encounter: Admit: 2023-07-26 | Discharge: 2023-07-26 | Payer: MEDICARE

## 2023-08-01 ENCOUNTER — Ambulatory Visit: Admit: 2023-08-01 | Discharge: 2023-08-02 | Payer: MEDICARE

## 2023-08-01 ENCOUNTER — Encounter: Admit: 2023-08-01 | Discharge: 2023-08-01 | Payer: MEDICARE

## 2023-08-01 DIAGNOSIS — E1169 Type 2 diabetes mellitus with other specified complication: Secondary | ICD-10-CM

## 2023-08-01 DIAGNOSIS — R600 Localized edema: Secondary | ICD-10-CM

## 2023-08-01 DIAGNOSIS — I872 Venous insufficiency (chronic) (peripheral): Secondary | ICD-10-CM

## 2023-08-01 DIAGNOSIS — E1122 Type 2 diabetes mellitus with diabetic chronic kidney disease: Secondary | ICD-10-CM

## 2023-08-01 NOTE — Progress Notes
 Date of Service: 08/01/2023    Subjective:         Paul Benjamin is a 78 y.o. male. He is here today with his wife, Paul Benjamin.    Diabetes    The patient presents to the [facility] for evaluation and management of diabetes mellitus. He was last seen by me in December 2024.    Since his last visit they've been eating more frozen/ fresh vegetables.     He has a new skin tear on his right lower extremity. He cleaned his leg this morning and dressed with antibiotic ointment and gauze. He also has a small scabbed over wound on his upper right leg that is warm and slightly red.    Has a healing scabbed wound on his left posterior lower extremity, also dressed with gauze.     At our last visit we discontinued his tresiba due to taking only 1-2 times per week and causing hypoglycemia when he did take.       Type 2 Diabetes mellitus  Dx: ~2010     A1c was 6.0% on July 24, 2023 with his PCP  POC glucose: 124     Current DM regimen: Ozempic 1 mg every Friday  Past treatment: Glimepiride- stopped during hospitalization, Jardiance- renal decline, Novolog, Jardiance- hypotension, tresiba  Adherence to medications: all the time  FSBG frequency: FreeStyle Libre 2  Hyperglycemia: occasionally post prandial   Hypoglycemia: rare  Hypoglycemia unawareness?: no  Meals per day / Carb intake: 3 and occasional snack  Exercise: Home Health PT     Complications of DM:  CAD: YES, with ablation and stenting, and pacemaker, HF with preserved EF  CVA: No  PVD: No  Amputations: No  Retinopathy: No  Gastropathy: No  Nephropathy: YES  Neuropathy: No  Depression: No  Chronic wounds / delayed healing: YES, has had recent cellulitis--August 2020  DM related hospitalizations: No     Last dilated eye exam: December 2021  Last dental exam: upcoming visit August 2022  Last DM education / nutritionist visit:      DM related medications:  Statin: Yes, atorvastatin 40 mg  ACE-I: No  ASA: No     Continuous Glucose Monitoring Analysis and Interpretation    Indication for Device Placement: Large Fluctuations in daily pre-prandial blood sugar values    Name/Type of Device Placed: FreeStyle Libre    Date of Print out: 08/01/23    Continuous glucose monitor downloaded and personally reviewed by me on 08/01/23.               Review of Systems  14 point review of systems, see HPI    Objective:          allopurinoL (ZYLOPRIM) 100 mg tablet Take one tablet by mouth daily. Take with food.  Indications: treatment to prevent acute gout attack    aspirin 81 mg chewable tablet Chew one tablet by mouth daily. Indications: treatment to prevent a heart attack    atorvastatin (LIPITOR) 40 mg tablet Take one tablet by mouth at bedtime daily. Indications: hardening of the arteries due to plaque buildup    blood-glucose sensor (FREESTYLE LIBRE 3 SENSOR) sensor device Use one each as directed daily.    CHOLEcalciferoL (vitamin D3) (OPTIMAL D3) 50,000 units capsule Take one capsule by mouth every 7 days. Wednesday.    clotrimazole (LOTRIMIN) 1 % topical cream Apply  topically to affected area twice daily. Indications: a skin infection due to the  fungus Candida    docusate (COLACE) 100 mg capsule Take one capsule by mouth daily.    furosemide (LASIX) 20 mg tablet TAKE 2 TABLETS BY MOUTH TWICE DAILY FOR VISIBLE WATER RETENTION    gabapentin (NEURONTIN) 100 mg capsule Take one capsule by mouth at bedtime daily.    ketoconazole (NIZORAL) 2 % topical cream Apply  topically to affected area twice daily. Indications: a skin infection due to the fungus Candida    levothyroxine (SYNTHROID) 75 mcg tablet Take one tablet by mouth daily.    metoprolol succinate XL (TOPROL XL) 25 mg extended release tablet Take one tablet by mouth daily. Indications: myocardial reinfarction prevention    nitroglycerin (NITROSTAT) 0.4 mg tablet Place 1 tablet under tongue every 5 minutes as needed for Chest Pain.    pantoprazole DR (PROTONIX) 40 mg tablet Take one tablet by mouth twice daily. Indications: bleeding from stomach, esophagus or duodenum    polyethylene glycol 3350 (MIRALAX) 17 g packet Take one packet by mouth daily as needed (Constipation - Second Line). Indications: constipation    pramipexole (MIRAPEX) 0.75 mg tablet Take one tablet by mouth at bedtime daily. Indications: restless legs syndrome, an extreme discomfort in the calf muscles when sitting or lying down    prasugreL (EFFIENT) 10 mg tablet Take one tablet by mouth daily.    semaglutide (OZEMPIC) 1 mg/dose (4 mg/3 mL) injection PEN Inject 1 mg under the skin every 7 days.    sennosides-docusate sodium (SENOKOT-S) 8.6/50 mg tablet Take one tablet by mouth daily as needed (Constipation - First Line). Indications: constipation    sodium chloride (SEA MIST) 0.65 % nasal spray Apply two sprays to each nostril as directed every 6 hours as needed. Indications: dryness of the nose     Vitals:    08/01/23 1251   BP: 116/56   BP Source: Arm, Right Upper   Pulse: 82   Resp: 20   PainSc: Zero   Weight: 98.9 kg (218 lb)   Height: 177.8 cm (5' 10)     Body mass index is 31.28 kg/m?[name]      Telehealth Patient Reported Vitals       Row Name 08/01/23 1251                Pain Score Zero        Patient Position Sitting        Resp 20        BP Source Arm, Right Upper                        Physical Exam  Constitutional:       Appearance: Normal appearance.   Pulmonary:      Effort: Pulmonary effort is normal.   Skin:     General: Skin is warm and dry.      Comments: Skin tear on left lower extremity + redness and warmth at site of scab   Neurological:      Mental Status: He is alert and oriented to person, place, and time.   Psychiatric:         Mood and Affect: Mood normal.         Behavior: Behavior normal.         Thought Content: Thought content normal.              Assessment and Plan:  Diabetes mellitus type 2, controlled   A1c was 6.0% on July 24, 2023  Target A1c < 6.5-7.0%  without hypoglycemia  Currently on Ozempic 1 mg every Friday  Complicated by: Nephropathy and CAD     Plan:  Patient with excellent glucose control  Continue Ozempic 1 mg  Jardiance stopped due to hypotension  Continue Libre 3 plus  Encouraged to re-establish with wound care in [place] for ongoing bilateral lower extremity wounds  Wound dressings replaced with tefla and gauze      Diabetic complication assessment:   Annual Dilated eye exam:   Annual labs (electrolytes and renal function): Reviewed, last done today: June 2024, following with nephrology  Annual Urine microalbumin/Cr: N/A CKD  Foot exam / monofilament exam (recommended annually):     Supportive care for diabetes:  Diabetic Educator (recommended annually):   Nutritionist:           Lipids: Hyperlipidemia    Recommendations for Statin treatment in diabetes:   104-75 yo: No risk factors - moderate dose statin.  CVD risk factors or overt CVD - high dose statin.    CVD risk factors include LDL cholesterol >=100 mg/dL (2.6 mmol/L), high blood pressure, and overweight   Currently on a statin: atorvastatin 40 mg  Annual fasting lipid panel completed: December 2024     Blood pressure: Hypertension   Controlled   ACE-I or ARB: No        RTC 6 months with me in person at [facility]        Orders Placed This Encounter    GLUCOSE METER DOWNLOAD    POC GLUCOSE QUANTITATIVE BLOOD       Patient Instructions   It was nice to see you today!    Goals we discussed today:     Keep up the great work!!    Continue Ozempic 1 mg each week     I'd like you to see the wound doctor about your legs        If you fill a prescription and it's your last refill please let us  know in a MyChart message so we can refill this before you run out    If you have any immediate concerns after 4pm, weekends, or holidays please contact the endocrinologist on call: [phone], and ask for the endocrinologist on call to be paged    Please contact [facility] for any questions or abnormal glucose values (above 300 or less than 70 repeatedly).  [phone]   My nurse, Alexa, can be reached at [phone]   Future Appointments   Date Time Provider Department Center   08/08/2023  3:15 PM Kyung Pi, MD [facility] Urology   08/12/2023  3:00 PM [id number]   09/05/2023  3:15 PM Kyung Pi, MD [facility] Urology   10/02/2023 10:00 AM Bluford Burkitt, MD [id number] IM   02/03/2024  2:30 PM Wafaa Deemer K, APRN-NP [id number] IM   06/04/2024  3:30 PM Kyung Pi, MD [facility]rology                              40 minutes spent on this patient's encounter with counseling and coordination of care taking >50% of the visit.

## 2023-08-03 ENCOUNTER — Encounter: Admit: 2023-08-03 | Discharge: 2023-08-03 | Payer: MEDICARE

## 2023-08-08 ENCOUNTER — Encounter: Admit: 2023-08-08 | Discharge: 2023-08-08 | Payer: MEDICARE

## 2023-08-08 ENCOUNTER — Ambulatory Visit: Admit: 2023-08-08 | Discharge: 2023-08-09 | Payer: MEDICARE

## 2023-08-09 ENCOUNTER — Encounter: Admit: 2023-08-09 | Discharge: 2023-08-09 | Payer: MEDICARE

## 2023-08-12 ENCOUNTER — Encounter: Admit: 2023-08-12 | Discharge: 2023-08-12 | Payer: MEDICARE

## 2023-08-12 DIAGNOSIS — I480 Paroxysmal atrial fibrillation: Secondary | ICD-10-CM

## 2023-08-12 DIAGNOSIS — I495 Sick sinus syndrome: Secondary | ICD-10-CM

## 2023-08-12 DIAGNOSIS — Z950 Presence of cardiac pacemaker: Secondary | ICD-10-CM

## 2023-08-21 ENCOUNTER — Encounter: Admit: 2023-08-21 | Discharge: 2023-08-21 | Payer: MEDICARE

## 2023-08-21 MED ORDER — METOPROLOL SUCCINATE 25 MG PO TB24
25 mg | ORAL_TABLET | Freq: Every day | ORAL | 1 refills | 90.00000 days | Status: AC
Start: 2023-08-21 — End: ?

## 2023-08-26 ENCOUNTER — Encounter: Admit: 2023-08-26 | Discharge: 2023-08-26 | Payer: MEDICARE

## 2023-08-28 ENCOUNTER — Encounter: Admit: 2023-08-28 | Discharge: 2023-08-28 | Payer: MEDICARE

## 2023-08-28 MED ORDER — METOPROLOL SUCCINATE 25 MG PO TB24
25 mg | ORAL_TABLET | Freq: Every day | ORAL | 3 refills | 90.00000 days | Status: AC
Start: 2023-08-28 — End: ?

## 2023-08-28 NOTE — Progress Notes
 Patient was scheduled for a [employer] on 08/22/23 that has not been received. Patient was instructed to send a manual transmission. Instructed if the transmitter does not appear to be working properly, they need to contact the device company directly. Patient was provided with that contact number. Requested the patient send us  a MyChart message or contact our device nurses at [phone] to let us  know after they have sent their transmission. Mychart sent to pt. BC      Note: Patient needs to send a manual transmission as we did not receive their scheduled remote interrogation.

## 2023-08-30 ENCOUNTER — Encounter: Admit: 2023-08-30 | Discharge: 2023-08-30 | Payer: MEDICARE

## 2023-09-04 ENCOUNTER — Encounter: Admit: 2023-09-04 | Discharge: 2023-09-04 | Payer: MEDICARE

## 2023-09-04 NOTE — Telephone Encounter
 Called and discussed with pt's spouse Paul Benjamin.  She states that Paul Benjamin is doing fine and has not had any issues.  He is taking his aspirin as prescribed.    Will route to Dr. Lander Pines for review and recommendations.

## 2023-09-04 NOTE — Telephone Encounter
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 Device reported 1 AT/AF episode. Stored EGMs are consistent with or suggestive of Atrial Fibrillation. AT/AF Burden: <0.1%. Total number of events: 1. Episode occurred on 05/29/23.   Episode lasted ~ 1 hour. V rates > 100 bpm the majority of the time. OAC is not present on med list. Please see full report and follow up with patient.    Thanks,  Odilia Bennett

## 2023-09-05 ENCOUNTER — Encounter: Admit: 2023-09-05 | Discharge: 2023-09-05 | Payer: MEDICARE

## 2023-09-05 ENCOUNTER — Ambulatory Visit: Admit: 2023-09-05 | Discharge: 2023-09-06 | Payer: MEDICARE

## 2023-09-06 ENCOUNTER — Encounter: Admit: 2023-09-06 | Discharge: 2023-09-06 | Payer: MEDICARE

## 2023-09-09 ENCOUNTER — Encounter: Admit: 2023-09-09 | Discharge: 2023-09-09 | Payer: MEDICARE

## 2023-09-10 ENCOUNTER — Encounter: Admit: 2023-09-10 | Discharge: 2023-09-10 | Payer: MEDICARE

## 2023-09-12 ENCOUNTER — Encounter: Admit: 2023-09-12 | Discharge: 2023-09-12 | Payer: MEDICARE

## 2023-09-13 ENCOUNTER — Encounter: Admit: 2023-09-13 | Discharge: 2023-09-13 | Payer: MEDICARE

## 2023-09-15 ENCOUNTER — Encounter: Admit: 2023-09-15 | Discharge: 2023-09-15 | Payer: MEDICARE

## 2023-09-18 ENCOUNTER — Encounter: Admit: 2023-09-18 | Discharge: 2023-09-18 | Payer: MEDICARE

## 2023-09-19 ENCOUNTER — Encounter: Admit: 2023-09-19 | Discharge: 2023-09-19 | Payer: MEDICARE

## 2023-09-19 DIAGNOSIS — N184 Chronic kidney disease, stage 4 (severe): Secondary | ICD-10-CM

## 2023-09-20 ENCOUNTER — Encounter: Admit: 2023-09-20 | Discharge: 2023-09-20 | Payer: MEDICARE

## 2023-09-23 ENCOUNTER — Encounter: Admit: 2023-09-23 | Discharge: 2023-09-23 | Payer: MEDICARE

## 2023-09-23 DIAGNOSIS — N184 Chronic kidney disease, stage 4 (severe): Secondary | ICD-10-CM

## 2023-09-23 DIAGNOSIS — E1122 Type 2 diabetes mellitus with diabetic chronic kidney disease: Principal | ICD-10-CM

## 2023-09-23 LAB — IRON + BINDING CAPACITY + %SAT+ FERRITIN
% SATURATION: 45
FERRITIN: 466 — ABNORMAL HIGH
IRON BINDING: 200 — ABNORMAL LOW
IRON: 90

## 2023-09-23 LAB — BASIC METABOLIC PANEL
ANION GAP: 9
BLD UREA NITROGEN: 58 — ABNORMAL HIGH
CALCIUM: 9.3
CHLORIDE: 110 — ABNORMAL HIGH
CO2: 26
CREATININE: 2.6 — ABNORMAL HIGH
GFR ESTIMATED: 23 — ABNORMAL LOW
GLUCOSE,PANEL: 158 — ABNORMAL HIGH
POTASSIUM: 4.3
SODIUM: 145

## 2023-09-23 LAB — PROTEIN/CR RATIO,UR RAN
PROT CREAT RAT/CAL: 0.8 — ABNORMAL HIGH
UR CREATININE, RAN: 36 — ABNORMAL LOW
UR TOTAL PROTEIN,RAN: 28 — ABNORMAL HIGH

## 2023-09-23 LAB — MICROALB/CR RATIO-URINE RANDOM
MICROALBUMIN, RAN: 114
MICROALBUMIN/CR RATIO URINE: 301 — ABNORMAL HIGH
UR CREATININE, RAN: 37 — ABNORMAL LOW

## 2023-09-23 LAB — CBC
HEMATOCRIT: 34 — ABNORMAL LOW
HEMOGLOBIN: 11 — ABNORMAL LOW
MCH: 28
MCHC: 32
MCV: 88
MPV: 9.4
PLATELET COUNT: 134 — ABNORMAL LOW
RBC COUNT: 3.8 — ABNORMAL LOW
RDW: 52 — ABNORMAL HIGH
WBC COUNT: 4.6

## 2023-09-23 LAB — PHOSPHORUS: PHOSPHORUS: 3.3

## 2023-09-24 ENCOUNTER — Encounter: Admit: 2023-09-24 | Discharge: 2023-09-24 | Payer: MEDICARE

## 2023-09-24 DIAGNOSIS — Z950 Presence of cardiac pacemaker: Secondary | ICD-10-CM

## 2023-09-24 DIAGNOSIS — I480 Paroxysmal atrial fibrillation: Secondary | ICD-10-CM

## 2023-09-24 DIAGNOSIS — I495 Sick sinus syndrome: Principal | ICD-10-CM

## 2023-09-24 NOTE — Progress Notes
 I MyChart sent to the patient to discuss that they are two years overdue for an in-office device check.      Their implanted cardiac device is currently being followed by enrolled in remote monitoring, a program that allows the device to communicate any concern to our device team.  Although we can review a lot of information about the device remotely, remote monitoring does not replace the need for in-office device evaluations.  To continue to care for your device, we require an in-person evaluation annually.  I informed the patient that variance from this policy may result in disconnection of their remote monitoring.     I instructed the patient that scheduling will reach out to make their appointments, but I also gave them the scheduling number [phone] to call if they have not heard from our scheduling team within 3 business days.      I have also reached out the nursing team to ensure appropriate office visit and device evaluation orders are placed for the scheduling team.     CDJ

## 2023-09-24 NOTE — Telephone Encounter
-----   Message from [place] H sent at 09/24/2023  4:18 PM CDT -----    ----- Message -----  From: Lang Boop  Sent: 09/24/2023   3:51 PM CDT  To: Eleanor Satchel, RN    Hi Melissa,     This pts spouse called scheduling requesting to schedule an in person device check per MyChart message. However I did not see a updated PPM order for this pt.   Would you be able to assist with putting a updated order in?    Thank you for reviewing and assisting.

## 2023-09-25 ENCOUNTER — Encounter: Admit: 2023-09-25 | Discharge: 2023-09-25 | Payer: MEDICARE

## 2023-09-25 ENCOUNTER — Ambulatory Visit: Admit: 2023-09-25 | Discharge: 2023-09-26 | Payer: MEDICARE

## 2023-09-25 DIAGNOSIS — N184 Chronic kidney disease, stage 4 (severe): Secondary | ICD-10-CM

## 2023-09-25 DIAGNOSIS — N189 Chronic kidney disease, unspecified: Principal | ICD-10-CM

## 2023-09-25 DIAGNOSIS — E8770 Fluid overload, unspecified: Secondary | ICD-10-CM

## 2023-09-25 LAB — PARATHYROID HORMONE: PTH: 88 — ABNORMAL HIGH (ref 14.2–75.2)

## 2023-09-25 LAB — 25-OH VITAMIN D (D2 + D3)
VITAMIN D (25-OH) TOTAL: 83 (ref 30–100)
VITAMIN D2: 5

## 2023-09-25 MED ORDER — DARBEPOETIN ALFA IN POLYSORBAT 60 MCG/0.3 ML IJ SYRG
60 ug | Freq: Once | SUBCUTANEOUS | 0 refills
Start: 2023-09-25 — End: ?

## 2023-09-25 NOTE — Patient Instructions
 The following are some measures which may help to slow down the progression of kidney disease:      Maintain adequate fluid intake.    Acute illnesses like the flu and COVID which may end up in hospitalization may impact on  kidney function and could cause the kidneys to fail.    It is important to keep up with  regular vaccinations for these diseases including the pneumonia vaccine as well.    If you develop diarrhea or vomiting and  are unable to keep up with your fluid intake, please report to the nearest ED since it may affect blood pressure and affect the blood flow to the kidney.    Avoid medications known as  NSAIDs which  include Advil, Motrin,Aleve, ibuprofen, naproxen.    Some medications used in patients with kidney disease including diuretics [water pill], some blood pressure medications [ACE inhibitors and ARB] like lisinopril, losartan may need to be held if you develop severe diarrhea or vomiting.  Under these conditions these medications may worsen kidney function and therefore need to be held until diarhea/vomiting resolves. If you have to hold your blood pressure or water pill medication for any of these reasons, please inform the provider who prescribed the medication.    Some medications used for stomach acid suppression  known as Proton Pump Inhibitors (which include pantoprazole, omeprazole, lansoprazole, nexium, protonix, prilosec) have been associated with kidney disease through unknown mechanisms. Unless you have a compelling indication for any of these medications , consider switching to a different class of medications known as histamine-2 receptor blockers eg. famotidine,  etc (your pharmacist or PCP can recommend a brand).    Nutrition management is a critical component in slowing kidney disease progression.  Visit the following web site for information on diet in patients with kidney disease:  https://www.kidney.org/nutrition    Visit the following website for health education videos on kidney disease:  https://learningcenter.kidney.org/

## 2023-09-25 NOTE — Progress Notes
 I saw the patient in the company of his wife  History        CC: Evaluation management of CKD        HPI: Paul Benjamin is a 78 y.o. 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  He used to follow-up with Dr.Qasim and was last seen in March 2023.  Interval history 06-20-22: He had acute bronchitis and required a 10 day course of antibiotics which he just completed. He has SoB on exertion. He denies dizziness or lightheadedness.    Interval history 12-21-2022: No interval health events. He denies difficulty urinating/ changes in his urine. He experiences some shortness of breath with ambulation.  His wife states that the dose of furosemide  was reduced and he is on a much lower does than is listed. He does not take NSAIDs.  According to his wife, the salt is his problem; he has to salt everything .    Interval history :He was admitted on 02-01-23 due to worsening renal function.  Renal ultrasound with bilateral mild hydronephrosis .Mildly distended urinary bladder with diffuse bladder   wall trabeculation/diverticula and layering debris. Obstructive uropathy  due to prostatic enlargement. He was discharged with a retained urethral catheter.  He left rehab on 02-17-2023.  He feels tired and has poor appetite. He is back on semaglutide  . He has been more sleepy since he got home.    Interval history 04-17-2023: He was admitted from 03/18/23 to 03/26/2023 on account of CAD which required pci of the RCA . Admission complicated by urinary tract infection. Furosemide  was held at discharge.  He was discharged to a skilled nursing facility. He is back home now. His breathing is stable.    Interval  history 10-15-2023: No major events since his last appointment. They are planning to move in with their daughter in [place].      Past Medical History   Past Medical History:    Arthritis    Atrial fibrillation (CMS-HCC)    CAD (coronary artery disease)    Cataract    CHF (congestive heart failure) (CMS-HCC)    Chronic kidney disease    Colon polyps    Coronary artery disease (CAD)    Enlarged prostate    Erectile dysfunction    Heart attack (CMS-HCC)    HX: anticoagulation    Hypercholesterolemia    Hypertension    Hypertriglyceridemia    Incontinence    Infection    Kidney disease    Myocardial infarction (CMS-HCC)    Obesity    Sleep apnea    SSS (sick sinus syndrome) (CMS-HCC)    Type II diabetes mellitus (CMS-HCC)    Unspecified deficiency anemia    Urinary tract infection        Family History  Family History   Problem Relation Name Age of Onset    Asthma Father Fairy     Cardiovascular Father Fairy     Hypertension Mother Mervyn     High Cholesterol Mother Lucille     Heart Attack Brother      Kidney Disease Sister          Social History  Social History     Socioeconomic History    Marital status: Married   Occupational History     Employer: [employer]     Comment: retired   Tobacco Use    Smoking status: Former     Current packs/day: 0.00  Average packs/day: 1 pack/day for 57.0 years (57.0 ttl pk-yrs)     Types: Cigarettes     Start date: 03/26/1962     Quit date: 03/26/1966     Years since quitting: 57.5     Passive exposure: Never    Smokeless tobacco: Never   Vaping Use    Vaping status: Never Used   Substance and Sexual Activity    Alcohol use: Never     Comment: rarely    Drug use: Never    Sexual activity: Never        Medication    HOME MEDS   allopurinoL  (ZYLOPRIM ) 100 mg tablet Take one tablet by mouth daily. Take with food.  Indications: treatment to prevent acute gout attack    aspirin  81 mg chewable tablet Chew one tablet by mouth daily. Indications: treatment to prevent a heart attack    atorvastatin  (LIPITOR) 40 mg tablet Take one tablet by mouth at bedtime daily. Indications: hardening of the arteries due to plaque buildup    blood-glucose sensor (FREESTYLE LIBRE 3 SENSOR) sensor device Use one each as directed daily.    CHOLEcalciferoL  (vitamin D3) (OPTIMAL D3) 50,000 units capsule Take one capsule by mouth every 7 days. Wednesday.    clotrimazole  (LOTRIMIN ) 1 % topical cream Apply  topically to affected area twice daily. Indications: a skin infection due to the fungus Candida    docusate (COLACE) 100 mg capsule Take one capsule by mouth daily.    doxycycline  monohydrate (MONODOX ) 100 mg capsule Take one capsule by mouth twice daily. FOR 10 DAYS    furosemide  (LASIX ) 20 mg tablet TAKE 2 TABLETS BY MOUTH TWICE DAILY FOR VISIBLE WATER  RETENTION    gabapentin  (NEURONTIN ) 100 mg capsule Take one capsule by mouth at bedtime daily.    ketoconazole  (NIZORAL ) 2 % topical cream Apply  topically to affected area twice daily. Indications: a skin infection due to the fungus Candida    levothyroxine  (SYNTHROID ) 75 mcg tablet Take one tablet by mouth daily.    metoprolol  succinate XL (TOPROL  XL) 25 mg extended release tablet Take one tablet by mouth daily.    nitroglycerin  (NITROSTAT ) 0.4 mg tablet Place 1 tablet under tongue every 5 minutes as needed for Chest Pain.    pantoprazole  DR (PROTONIX ) 40 mg tablet Take 1 tablet by mouth twice daily    polyethylene glycol 3350  (MIRALAX ) 17 g packet Take one packet by mouth daily as needed (Constipation - Second Line). Indications: constipation    pramipexole  (MIRAPEX ) 0.75 mg tablet Take one tablet by mouth at bedtime daily. Indications: restless legs syndrome, an extreme discomfort in the calf muscles when sitting or lying down    prasugreL (EFFIENT) 10 mg tablet Take one tablet by mouth daily.    semaglutide  (OZEMPIC ) 1 mg/dose (4 mg/3 mL) injection PEN Inject 1 mg under the skin every 7 days.    sennosides-docusate sodium  (SENOKOT-S) 8.6/50 mg tablet Take one tablet by mouth daily as needed (Constipation - First Line). Indications: constipation    sodium chloride  (SEA MIST) 0.65 % nasal spray Apply two sprays to each nostril as directed every 6 hours as needed. Indications: dryness of the nose      Tresiba  20 units daily    Review of Systems  Constitutional: negative  Eyes: negative  Ears, nose, mouth, throat, and face: negative  Respiratory: negative  Cardiovascular: negative  Gastrointestinal: negative  Genitourinary:negative  Integument/breast: negative  Hematologic/lymphatic: negative  Musculoskeletal:negative  Neurological: negative  Endocrine: negative  Physical Exam        There were no vitals filed for this visit.        BP Readings from Last 5 Encounters:   09/23/23 (!) (P) 151/74   09/09/23 130/63   09/05/23 131/78   08/26/23 129/60   08/12/23 (!) 150/81          Wt Readings from Last 5 Encounters:   09/09/23 98 kg (216 lb)   09/05/23 98.3 kg (216 lb 12.8 oz)   08/26/23 98.9 kg (218 lb)   08/12/23 98 kg (216 lb)   08/01/23 98.9 kg (218 lb)        There is no height or weight on file to calculate BMI.       Gen: Alert and Oriented, No Acute Distress   HEENT: Sclera normal; MMM  CV:  S1 and S2 normal, no rubs, murmurs or gallops   Pulm: Clear to Auscultation bilateral   GI: BS+ x4, non-tender to palpation  Neuro: Grossly normal, moving all extremities, speech intact  Ext: He has bilateral lower extremity edema.  Skin: no rash     Assessment and Plan        Paul Benjamin is a 78 y.o. 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  He has diabetes mellitus which is well controlled.  Underlying  heart failure may contribute to this as well. Acute kidney injury during previous admission was due to obstructive uropathy.  Serum creatinine remains stable .  We discussed measures to prevent acute kidney injury/slow progression of chronic kidney disease.    --Thrombocytopenia: Continue to monitor. Reassess with next set of labs. If worsening, will refer to hematology.    --Lower urinary tract obstruction: He saw urology. He is not a good surgical candidate.  He opted to undergo placement of a suprapubic catheter.    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  He is on  darbepoetin 60mcg every 2 weeks. Hemoglobin>11g/dl.   Darbepoetin on hold.  -We will reach out to the infusion center to decrease frequency of hemoglobin assessment to  monthly.      --Volume overload:  This is stable take furosemide  40mg   2 times daily  prn      -ADPKD: The use of tolvaptan has not been studied in  this age group and at this eGFR.  He underwent MRA of the brain in May 2023 without evidence of cerebral aneurysm.    --Heart failure: Continue GDMT as per cardiologist    -- Atrial fibrillation: Continue metoprolol     [place] DELENA Dunker, MD      There are no Patient Instructions on file for this visit.

## 2023-09-26 DIAGNOSIS — D696 Thrombocytopenia, unspecified: Principal | ICD-10-CM

## 2023-09-30 ENCOUNTER — Encounter: Admit: 2023-09-30 | Discharge: 2023-09-30 | Payer: MEDICARE

## 2023-09-30 DIAGNOSIS — N184 Chronic kidney disease, stage 4 (severe): Principal | ICD-10-CM

## 2023-10-01 ENCOUNTER — Encounter: Admit: 2023-10-01 | Discharge: 2023-10-01 | Payer: MEDICARE

## 2023-10-02 ENCOUNTER — Encounter: Admit: 2023-10-02 | Discharge: 2023-10-02 | Payer: MEDICARE

## 2023-10-03 ENCOUNTER — Ambulatory Visit: Admit: 2023-10-03 | Discharge: 2023-10-04 | Payer: MEDICARE

## 2023-10-03 ENCOUNTER — Encounter: Admit: 2023-10-03 | Discharge: 2023-10-03 | Payer: MEDICARE

## 2023-10-04 ENCOUNTER — Encounter: Admit: 2023-10-04 | Discharge: 2023-10-04 | Payer: MEDICARE

## 2023-10-07 ENCOUNTER — Encounter: Admit: 2023-10-07 | Discharge: 2023-10-07 | Payer: MEDICARE

## 2023-10-11 ENCOUNTER — Encounter: Admit: 2023-10-11 | Discharge: 2023-10-11 | Payer: MEDICARE

## 2023-10-11 MED ORDER — PANTOPRAZOLE 40 MG PO TBEC
40 mg | ORAL_TABLET | Freq: Two times a day (BID) | ORAL | 1 refills | 90.00000 days | Status: AC
Start: 2023-10-11 — End: ?

## 2023-10-16 ENCOUNTER — Encounter: Admit: 2023-10-16 | Discharge: 2023-10-16 | Payer: MEDICARE

## 2023-10-16 MED ORDER — FUROSEMIDE 20 MG PO TAB
ORAL_TABLET | ORAL | 3 refills | 90.00000 days | Status: AC
Start: 2023-10-16 — End: ?

## 2023-10-16 NOTE — Telephone Encounter
 Received request for furosemide  refill. LOV 09/25/23, upcoming appt 11/11/23. Refilled per protocol

## 2023-10-22 ENCOUNTER — Encounter: Admit: 2023-10-22 | Discharge: 2023-10-22 | Payer: MEDICARE

## 2023-10-24 ENCOUNTER — Encounter: Admit: 2023-10-24 | Discharge: 2023-10-24 | Payer: MEDICARE

## 2023-10-30 ENCOUNTER — Encounter: Admit: 2023-10-30 | Discharge: 2023-10-30 | Payer: MEDICARE

## 2023-10-31 ENCOUNTER — Encounter: Admit: 2023-10-31 | Discharge: 2023-10-31 | Payer: MEDICARE

## 2023-10-31 ENCOUNTER — Ambulatory Visit: Admit: 2023-10-31 | Discharge: 2023-11-01 | Payer: MEDICARE

## 2023-11-01 ENCOUNTER — Encounter: Admit: 2023-11-01 | Discharge: 2023-11-01 | Payer: MEDICARE

## 2023-11-01 NOTE — Telephone Encounter
 Faxed LOV per pt request to [facility] with confirmation of successful fax received

## 2023-11-04 ENCOUNTER — Encounter: Admit: 2023-11-04 | Discharge: 2023-11-04 | Payer: MEDICARE

## 2023-11-05 ENCOUNTER — Encounter: Admit: 2023-11-05 | Discharge: 2023-11-05 | Payer: MEDICARE

## 2023-11-11 ENCOUNTER — Encounter: Admit: 2023-11-11 | Discharge: 2023-11-11 | Payer: MEDICARE

## 2023-11-27 ENCOUNTER — Encounter: Admit: 2023-11-27 | Discharge: 2023-11-27 | Payer: MEDICARE

## 2023-11-28 ENCOUNTER — Encounter: Admit: 2023-11-28 | Discharge: 2023-11-28 | Payer: MEDICARE

## 2023-11-28 ENCOUNTER — Ambulatory Visit: Admit: 2023-11-28 | Discharge: 2023-11-29 | Payer: MEDICARE

## 2023-11-29 ENCOUNTER — Encounter: Admit: 2023-11-29 | Discharge: 2023-11-29 | Payer: MEDICARE

## 2023-11-29 NOTE — Progress Notes
 Called pt, spoke to wife, ID verified, to inform the [facility]  infusions have been discontinued because the Hgb is not at goal. Verbalized understanding.

## 2023-12-02 ENCOUNTER — Encounter: Admit: 2023-12-02 | Discharge: 2023-12-02 | Payer: MEDICARE

## 2023-12-02 MED ORDER — FUROSEMIDE 20 MG PO TAB
20 mg | ORAL_TABLET | Freq: Two times a day (BID) | ORAL | 3 refills | 90.00000 days | Status: AC
Start: 2023-12-02 — End: ?

## 2023-12-06 ENCOUNTER — Encounter: Admit: 2023-12-06 | Discharge: 2023-12-06 | Payer: MEDICARE

## 2023-12-13 ENCOUNTER — Encounter: Admit: 2023-12-13 | Discharge: 2023-12-13 | Payer: MEDICARE

## 2023-12-13 ENCOUNTER — Inpatient Hospital Stay: Admit: 2023-12-13 | Discharge: 2023-12-13 | Payer: MEDICARE

## 2023-12-13 ENCOUNTER — Emergency Department: Admit: 2023-12-13 | Discharge: 2023-12-13 | Payer: MEDICARE

## 2023-12-13 VITALS — BP 122/70 | HR 71 | Temp 98.20000°F | Ht 70.0 in | Wt 200.0 lb

## 2023-12-13 DIAGNOSIS — Z7901 Long term (current) use of anticoagulants: Secondary | ICD-10-CM

## 2023-12-13 DIAGNOSIS — N179 Acute kidney failure, unspecified: Principal | ICD-10-CM

## 2023-12-13 LAB — COMPREHENSIVE METABOLIC PANEL
~~LOC~~ BKR ALBUMIN: 4 g/dL — ABNORMAL HIGH (ref 3.5–5.0)
~~LOC~~ BKR ALK PHOSPHATASE: 91 U/L — ABNORMAL LOW (ref 25–110)
~~LOC~~ BKR ALT: 15 U/L (ref 7–56)
~~LOC~~ BKR ANION GAP: 11 10*3/uL — AB (ref 3–12)
~~LOC~~ BKR AST: 14 U/L (ref 7–40)
~~LOC~~ BKR CHLORIDE: 109 mmol/L — ABNORMAL LOW (ref 98–110)
~~LOC~~ BKR CO2: 25 mmol/L (ref 21–30)
~~LOC~~ BKR GLOMERULAR FILTRATION RATE (GFR): 16 mL/min — ABNORMAL LOW (ref >60–4.80)
~~LOC~~ BKR GLUCOSE, RANDOM: 155 mg/dL — ABNORMAL HIGH (ref 70–100)
~~LOC~~ BKR POTASSIUM: 4.4 mmol/L — ABNORMAL LOW (ref 3.5–5.1)
~~LOC~~ BKR SODIUM, SERUM: 145 mmol/L — ABNORMAL LOW (ref 137–147)
~~LOC~~ BKR TOTAL BILIRUBIN: 0.6 mg/dL (ref 0.2–1.3)

## 2023-12-13 LAB — CBC AND DIFF
~~LOC~~ BKR ABSOLUTE BASO COUNT: 0 10*3/uL (ref 0.00–0.20)
~~LOC~~ BKR ABSOLUTE EOS COUNT: 0.1 10*3/uL — AB (ref 0.00–0.45)
~~LOC~~ BKR ABSOLUTE MONO COUNT: 0.5 10*3/uL (ref 0.00–0.80)
~~LOC~~ BKR MCH: 30 pg — ABNORMAL HIGH (ref 26.0–34.0)
~~LOC~~ BKR MCHC: 34 g/dL — ABNORMAL HIGH (ref 32.0–36.0)
~~LOC~~ BKR MDW (MONOCYTE DISTRIBUTION WIDTH): 19 (ref <=20.6)
~~LOC~~ BKR PLATELET COUNT: 151 10*3/uL — AB (ref 150–400)
~~LOC~~ BKR RDW: 15 % — ABNORMAL HIGH (ref 11.0–15.0)
~~LOC~~ BKR WBC COUNT: 6.4 10*3/uL (ref 4.50–11.00)

## 2023-12-13 LAB — PROTIME INR (PT)
~~LOC~~ BKR INR: 1.3 — ABNORMAL HIGH (ref 0.9–1.2)
~~LOC~~ BKR PROTIME: 14 s — ABNORMAL HIGH (ref 9.9–14.2)

## 2023-12-13 LAB — PROCALCITONIN: ~~LOC~~ BKR PROCALCITONIN: 0.2 ng/mL

## 2023-12-13 LAB — PTT (APTT): ~~LOC~~ BKR PTT: 53 s — ABNORMAL HIGH (ref 24.0–36.5)

## 2023-12-13 MED ORDER — SENNOSIDES-DOCUSATE SODIUM 8.6-50 MG PO TAB
1 | Freq: Every day | ORAL | 0 refills | Status: DC | PRN
Start: 2023-12-13 — End: 2023-12-17
  Administered 2023-12-17: 13:00:00 1 via ORAL

## 2023-12-13 MED ORDER — ONDANSETRON HCL (PF) 4 MG/2 ML IJ SOLN
4 mg | Freq: Once | INTRAVENOUS | 0 refills | Status: CP
Start: 2023-12-13 — End: ?
  Administered 2023-12-13: 18:00:00 4 mg via INTRAVENOUS

## 2023-12-13 MED ORDER — POLYETHYLENE GLYCOL 3350 17 GRAM PO PWPK
1 | Freq: Every day | ORAL | 0 refills | Status: DC | PRN
Start: 2023-12-13 — End: 2023-12-17
  Administered 2023-12-17: 13:00:00 17 g via ORAL

## 2023-12-13 MED ORDER — PRAMIPEXOLE 0.25 MG PO TAB
.75 mg | Freq: Every evening | ORAL | 0 refills | Status: DC
Start: 2023-12-13 — End: 2023-12-17
  Administered 2023-12-14 – 2023-12-17 (×4): 0.75 mg via ORAL

## 2023-12-13 MED ORDER — GABAPENTIN 100 MG PO CAP
100 mg | Freq: Every evening | ORAL | 0 refills | Status: DC
Start: 2023-12-13 — End: 2023-12-17
  Administered 2023-12-14 – 2023-12-17 (×4): 100 mg via ORAL

## 2023-12-13 MED ORDER — ASPIRIN 81 MG PO CHEW
81 mg | Freq: Every day | ORAL | 0 refills | Status: DC
Start: 2023-12-13 — End: 2023-12-17
  Administered 2023-12-14 – 2023-12-17 (×4): 81 mg via ORAL

## 2023-12-13 MED ORDER — HEPARIN, PORCINE (PF) 5,000 UNIT/0.5 ML IJ SYRG
5000 [IU] | SUBCUTANEOUS | 0 refills | Status: DC
Start: 2023-12-13 — End: 2023-12-17
  Administered 2023-12-14 – 2023-12-17 (×11): 5000 [IU] via SUBCUTANEOUS

## 2023-12-13 MED ORDER — LEVOTHYROXINE 50 MCG PO TAB
75 ug | Freq: Every day | ORAL | 0 refills | Status: DC
Start: 2023-12-13 — End: 2023-12-17
  Administered 2023-12-14 – 2023-12-17 (×4): 75 ug via ORAL

## 2023-12-13 MED ORDER — SODIUM CHLORIDE 0.9% IV BOLUS
1000 mL | Freq: Once | INTRAVENOUS | 0 refills | Status: CP
Start: 2023-12-13 — End: ?
  Administered 2023-12-13: 19:00:00 1000 mL via INTRAVENOUS

## 2023-12-13 MED ORDER — ALLOPURINOL 100 MG PO TAB
100 mg | Freq: Every day | ORAL | 0 refills | Status: DC
Start: 2023-12-13 — End: 2023-12-17
  Administered 2023-12-14 – 2023-12-17 (×4): 100 mg via ORAL

## 2023-12-13 MED ORDER — PRASUGREL HCL 10 MG PO TAB
10 mg | Freq: Every day | ORAL | 0 refills | Status: DC
Start: 2023-12-13 — End: 2023-12-17
  Administered 2023-12-14 – 2023-12-17 (×4): 10 mg via ORAL

## 2023-12-13 MED ORDER — METOPROLOL SUCCINATE 25 MG PO TB24
25 mg | Freq: Every day | ORAL | 0 refills | Status: DC
Start: 2023-12-13 — End: 2023-12-17
  Administered 2023-12-15 – 2023-12-17 (×3): 25 mg via ORAL

## 2023-12-13 MED ORDER — PANTOPRAZOLE 20 MG PO TBEC
40 mg | Freq: Two times a day (BID) | ORAL | 0 refills | Status: DC
Start: 2023-12-13 — End: 2023-12-17
  Administered 2023-12-14 – 2023-12-17 (×8): 40 mg via ORAL

## 2023-12-13 MED ORDER — ONDANSETRON 4 MG PO TBDI
4 mg | ORAL | 0 refills | Status: DC | PRN
Start: 2023-12-13 — End: 2023-12-17

## 2023-12-13 MED ORDER — ACETAMINOPHEN 325 MG PO TAB
650 mg | ORAL | 0 refills | Status: DC | PRN
Start: 2023-12-13 — End: 2023-12-17

## 2023-12-13 MED ORDER — CEFTRIAXONE INJ 1GM IVP
1 g | Freq: Once | INTRAVENOUS | 0 refills | Status: CP
Start: 2023-12-13 — End: ?
  Administered 2023-12-13: 21:00:00 1 g via INTRAVENOUS

## 2023-12-13 MED ORDER — ONDANSETRON HCL (PF) 4 MG/2 ML IJ SOLN
4 mg | INTRAVENOUS | 0 refills | Status: DC | PRN
Start: 2023-12-13 — End: 2023-12-17

## 2023-12-13 MED ORDER — MELATONIN 5 MG PO TAB
5 mg | Freq: Every evening | ORAL | 0 refills | Status: DC | PRN
Start: 2023-12-13 — End: 2023-12-17

## 2023-12-13 MED ORDER — ATORVASTATIN 40 MG PO TAB
40 mg | Freq: Every evening | ORAL | 0 refills | Status: DC
Start: 2023-12-13 — End: 2023-12-17
  Administered 2023-12-14 – 2023-12-17 (×4): 40 mg via ORAL

## 2023-12-13 NOTE — ED Notes
 ED Initial Provider Note:    This patient was seen in the ED triage area to initiate and expedite the patients ED care when possible.    ED Chief Complaint:   Chief Complaint   Patient presents with    Cough     Cough, weakness, loss of appetite, vomiting. +covid last Monday.       S: Paul Benjamin is a 78 y.o. male who presents to the Emergency Department for cough, weakness, vomiting.  States he tested positive for COVID about a week ago.  Since then has had a persistent cough, generalized weakness, fatigue, and today he began to have vomiting.  He denies chest pain or shortness of breath, fevers, diarrhea, abdominal pain.    PMHx:  Past Medical History:    Arthritis    Atrial fibrillation (CMS-HCC)    CAD (coronary artery disease)    Cataract    CHF (congestive heart failure) (CMS-HCC)    Chronic kidney disease    Colon polyps    Coronary artery disease (CAD)    Enlarged prostate    Erectile dysfunction    Heart attack (CMS-HCC)    HX: anticoagulation    Hypercholesterolemia    Hypertension    Hypertriglyceridemia    Incontinence    Infection    Kidney disease    Myocardial infarction (CMS-HCC)    Obesity    Sleep apnea    SSS (sick sinus syndrome) (CMS-HCC)    Type II diabetes mellitus (CMS-HCC)    Unspecified deficiency anemia    Urinary tract infection       BP 122/70 (BP Source: Arm, Left Upper)  - Pulse 71  - Temp 36.8 ?C (98.2 ?F)  - Wt 90.7 kg (200 lb)  - SpO2 100%  - BMI 29.53 kg/m?    O: Brief Physical: Ill-appearing, productive cough with deep inspiration    A/P: The patient was seen by me as an initial provider in triage. A brief history and physical was obtained. My exam is intended to be an initial medial screening exam. Initial orders have been placed by me. My working diagnosis is COVID-19, pneumonia, dehydration.    The patient is deemed appropriate for the main ED. The patient's care will be resumed by the ED provider care team once the patient is roomed in the ED. A more detailed / complete H&P will be documented by those providers.

## 2023-12-13 NOTE — H&P (View-Only)
 Admission History and Physical Examination    Name: Paul Benjamin   MRN: [id number]     DOB: [birth year]      Age: 78 y.o.  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    Acute on chronic kidney injury (CKD 4)  - Follows with  nephrology, baseline Cr ~2.6, underlying etiology is ADPKD  - Has h/o BPH, neurogenic bladder with chronic indwelling foley (and plans for SPT later)  - Cr 3.6 on admission  - UA with 3+ leuks, neg nitrites, 2+ protein, packed WBCs, 11-20 RBCs with indwelling foley  - No fever, leukocytosis, or other symptoms suggestive of UTI  or infected cyst  - Suspect etiology is prerenal azotemia in the setting of poor po intake and diuretic use  Plan  - Currently receiving 1L IVF  - Daily BMPs  - Foley replaced in the ED  - Renal ultrasound  - Hold Lasix   - Observe off of abx unless develops symptoms/signs of UTI, but will follow urine cx    Recent Covid-19  - Symptom onset 9/8, tested positive 9/12; now out of the window for remdesivir   - Normal O2 sats, no dyspnea, CXR w/o acute findings, no clinical concern for PE and no evidence of superimposed bacterial pneumonia  Plan  - Follow sputum cultures, procalcitonin    Coronary artery disease  Hypertension  Dyslipidemia  SSS s/p PPM (2008)  - With NSTEMI 02/2023 s/p PCI of the RCA  - Last TTE 05/2023: EF 51%, akinesis of the basal inferoseptum and inferior walls with hypokinesis of the mid to apical inferior wall.  Grade 1 LV diastolic dysfunction.   Plan  - Continue aspirin , prasugrel (DAPT until 02/2024), atorvastatin   - Continue metoprolol  XL 25 mg daily  - Hold Lasix  due to AKI    Paroxysmal a-fib  Without apparent recurrence after ablation procedure in 2009. Not on anticoagulation  - Continue metoprolol     Type 2 diabetes  - Outpatient meds: Ozempic . Last A1c 5.8% 02/2023  Plan  - LDCF aspart    Hypothyroidism  - Continue levothyroxine     RLS  - Continue gabapentin , pramipexole     Gout  - Continue allopurinol     GERD  - Continue PPI    PPx: SQH  FEN: 1L IVF, monitor lytes/replete prn, regular diet  Full code, confirmed on admission  Dispo: admit to inpatient, PT/OT consulted    Total Time Today was >80 minutes in the following activities: Preparing to see the patient, Obtaining and/or reviewing separately obtained history, Performing a medically appropriate examination and/or evaluation, Counseling and educating the patient/family/caregiver, Ordering medications, tests, or procedures, Referring and communication with other health care professionals (when not separately reported), Documenting clinical information in  the electronic or other health record, Independently interpreting results (not separately reported) and communicating results to the patient/family/caregiver, and Care coordination (not separately reported)      Adina Blush, MD  Hospitalist  Pager [phone]    ____________________________________________________________________________    Primary Care Physician: Kyung Goodell  Verified    Chief Complaint:  weakness  History of Present Illness: Paul Benjamin is a 78 y.o. 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    He developed symptoms of Covid-19 including cough, dyspnea on 9/8 and tested positive on 8/12. Since then, he has had generalized weakness, cough productive of some sputum although not able to characterize presence/absence of purulence or blood. No fever, chills. Did have nausea and decreased oral intake in the last few days as well as self-limited diarrhea.  Cough and congestion were improving until yesterday while taking TOC meds, but got a bit worse today. He has had persistent generalized weakness and just feels lousy, prompting him to come in today.     No leg swelling. Still having consistent UOP from foley.        Past Medical History:    Abnormal involuntary movement    Arthritis    Atrial fibrillation (CMS-HCC)    CAD (coronary artery disease)    Cataract    CHF (congestive heart failure) (CMS-HCC)    Chronic kidney disease    Colon polyps    Coronary artery disease (CAD)    Enlarged prostate    Erectile dysfunction    Gout    Heart attack (CMS-HCC)    HX: anticoagulation    Hypercholesterolemia    Hypertension    Hypertriglyceridemia    Incontinence    Infection    Kidney disease    Myocardial infarction (CMS-HCC)    Obesity    Sleep apnea    SSS (sick sinus syndrome) (CMS-HCC)    Type II diabetes mellitus (CMS-HCC)    Unspecified deficiency anemia    Urinary tract infection     Surgical History:   Procedure Laterality Date    PACEMAKER PLACEMENT  09/30/2006    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    Performed by Laurian Armin BROCKS, MD at [facility] EP LAB    Removal Permanent Pacemaker N/A 01/17/2016    Performed by Laurian Armin BROCKS, MD at [facility] EP LAB    ESOPHAGOGASTRODUODENOSCOPY WITH BIOPSY - FLEXIBLE N/A 03/25/2023    Performed by Melvenia Dana, MD at [facility] ENDO    [facility]      CARDIAC CATHERIZATION  03/19/2023    CORONARY ANGIOPLASTY  03/18/2023    CORONARY STENT PLACEMENT      HX APPENDECTOMY      HX HEART CATHETERIZATION  March 18 2023     Family History   Problem Relation Name Age of Onset    Asthma Father Fairy     Cardiovascular Father Fairy     Hypertension Mother Mervyn     High Cholesterol Mother Lucille     Heart Attack Brother      Kidney Disease Sister       Social History     Tobacco Use    Smoking status: Former     Current packs/day: 0.00  Average packs/day: 1 pack/day for 57.0 years (57.0 ttl pk-yrs)     Types: Cigarettes     Start date: 03/26/1962     Quit date: 03/26/1966     Years since quitting: 57.7     Passive exposure: Never    Smokeless tobacco: Never   Vaping Use    Vaping status: Never Used   Substance and Sexual Activity    Alcohol use: Never     Comment: rarely    Drug use: Never    Sexual activity: Never             Immunizations (includes history and patient reported):   Immunization History   Administered Date(s) Administered    COVID-19 (MODERNA), mRNA vacc, 100 mcg/0.5 mL (PF) 05/07/2019, 06/04/2019, 01/20/2020, 10/12/2020    COVID-19 Bivalent (61YR+)(PFIZER), mRNA vacc, 12mcg/0.3mL 01/12/2021    Covid-19 mRNA Vaccine >=12yo (Moderna)(Spikevax) 01/17/2022    Covid-19 mRNA Vaccine >=12yo (Pfizer)(Comirnaty) 01/17/2023           Allergies:  Ciprofloxacin, Lisinopril, Cefuroxime, Clindamycin, Plavix [clopidogrel], Sulfa (sulfonamide antibiotics), and Quinine    Medications:  Current Medications   Medication Directions   allopurinoL  (ZYLOPRIM ) 100 mg tablet Take one tablet by mouth daily. Take with food.  Indications: treatment to prevent acute gout attack   aspirin  81 mg chewable tablet Chew one tablet by mouth daily. Indications: treatment to prevent a heart attack   atorvastatin  (LIPITOR) 40 mg tablet Take one tablet by mouth at bedtime daily. Indications: hardening of the arteries due to plaque buildup   blood-glucose sensor (FREESTYLE LIBRE 3 SENSOR) sensor device Use one each as directed daily.   clotrimazole  (LOTRIMIN ) 1 % topical cream Apply  topically to affected area twice daily. Indications: a skin infection due to the fungus Candida   docusate (COLACE) 100 mg capsule Take one capsule by mouth daily.   doxycycline  monohydrate (MONODOX ) 100 mg capsule Take one capsule by mouth twice daily. FOR 10 DAYS   furosemide  (LASIX ) 20 mg tablet Take one tablet by mouth twice daily.   gabapentin  (NEURONTIN ) 100 mg capsule Take one capsule by mouth at bedtime daily.   ketoconazole  (NIZORAL ) 2 % topical cream Apply  topically to affected area twice daily. Indications: a skin infection due to the fungus Candida   levothyroxine  (SYNTHROID ) 75 mcg tablet Take one tablet by mouth daily.   metoprolol  succinate XL (TOPROL  XL) 25 mg extended release tablet Take one tablet by mouth daily.   nitroglycerin  (NITROSTAT ) 0.4 mg tablet Place 1 tablet under tongue every 5 minutes as needed for Chest Pain.   pantoprazole  DR (PROTONIX ) 40 mg tablet Take 1 tablet by mouth twice daily   polyethylene glycol 3350  (MIRALAX ) 17 g packet Take one packet by mouth daily as needed (Constipation - Second Line). Indications: constipation   pramipexole  (MIRAPEX ) 0.75 mg tablet Take one tablet by mouth at bedtime daily. Indications: restless legs syndrome, an extreme discomfort in the calf muscles when sitting or lying down   prasugreL (EFFIENT) 10 mg tablet Take one tablet by mouth daily.   semaglutide  (OZEMPIC ) 1 mg/dose (4 mg/3 mL) injection PEN Inject 1 mg under the skin every 7 days.   sennosides-docusate sodium  (SENOKOT-S) 8.6/50 mg tablet Take one tablet by mouth daily as needed (Constipation - First Line). Indications: constipation   sodium chloride  (SEA MIST) 0.65 % nasal spray Apply two sprays to each nostril as directed every 6 hours as needed. Indications: dryness of the nose     Physical Exam:  Vital Signs: Last Filed In 24 Hours Vital Signs: 24 Hour Range   BP: 133/76 (09/19 1400)  Temp: 36.8 ?C (98.2 ?F) (09/19 1043)  Pulse: 71 (09/19 1400)  Respirations: 14 PER MINUTE (09/19 1400)  SpO2: 97 % (09/19 1400)  O2 Device: None (Room air) (09/19 1043) BP: (112-133)/(67-76)   Temp:  [36.8 ?C (98.2 ?F)]   Pulse:  [71-84]   Respirations:  [14 PER MINUTE-19 PER MINUTE]   SpO2:  [97 %-100 %]   O2 Device: None (Room air)          General:  Alert, cooperative, no distress, appears stated age  Head:  Normocephalic, without obvious abnormality, atraumatic  Eyes:  Conjunctivae/corneas clear.  EOMs intact.  Throat:  Wearing mask due to Covid-19  Neck:  Supple, symmetrical, trachea midline  Lungs:  Clear to auscultation bilaterally, no wheezes or rales. Normal work of breathing on room air.  Chest wall:  No tenderness or deformity.  Heart: Normal rate, regular rhythm, S1 & S2 normal, no murmur rubs or gallops  Abdomen:  Soft, non-tender, nondistended, no rebound or guarding  Extremities:   2+ and symmetric radial pulses, no LE edema  Skin:   Skin color, texture, turgor normal.  No rashes or lesions on exposed skin.  GU: foley in place with light yeloow urine  Neurologic:  CNIII - XII intact grossly, moves all 4 extremities w/o apparent deficit  Psychiatric: Calm, cooperative     POCUS: unable to visualize IVC. Grossly normal LVEF in PLAX and A4C views. ?calcified AV. No B lines in visualized lung fields. Many renal cysts bilaterally, unclear if hydronephrosis.     Lab/Radiology/Other Diagnostic Tests:  24-hour labs:    Results for orders placed or performed during the hospital encounter of 12/13/23 (from the past 24 hours)   CBC AND DIFF    Collection Time: 12/13/23 12:21 PM   Result Value Ref Range    White Blood Cells 6.40 4.50 - 11.00 10*3/uL    Red Blood Cells 3.61 (L) 4.40 - 5.50 10*6/uL    Hemoglobin 11.1 (L) 13.5 - 16.5 g/dL    Hematocrit 68.2 (L) 40.0 - 50.0 %    MCV 88.0 80.0 - 100.0 fL    MCH 30.7 26.0 - 34.0 pg    MCHC 34.9 32.0 - 36.0 g/dL    RDW 84.0 (H) 88.9 - 15.0 %    Platelet Count 151 150 - 400 10*3/uL    MPV 7.4 7.0 - 11.0 fL    Neutrophils 82.1 (H) 41.0 - 77.0 %    Lymphocytes 8.3 (L) 24.0 - 44.0 %    Monocytes 7.3 4.0 - 12.0 %    Eosinophils 2.1 0.0 - 5.0 %    Basophils 0.2 0.0 - 2.0 %    Absolute Neutrophil Count 5.20 1.80 - 7.00 10*3/uL    Absolute Lymph Count 0.50 (L) 1.00 - 4.80 10*3/uL    Absolute Monocyte Count 0.50 0.00 - 0.80 10*3/uL    Absolute Eosinophil Count 0.10 0.00 - 0.45 10*3/uL    Absolute Basophil Count 0.00 0.00 - 0.20 10*3/uL    MDW (Monocyte Distribution Width) 19.2 <=20.6   COMPREHENSIVE METABOLIC PANEL    Collection Time: 12/13/23 12:21 PM   Result Value Ref Range    Sodium 145 137 - 147 mmol/L    Potassium 4.4 3.5 - 5.1 mmol/L    Chloride 109 98 - 110 mmol/L    Glucose 155 (H) 70 - 100 mg/dL    Blood  Urea Nitrogen 73 (H) 7 - 25 mg/dL    Creatinine 6.31 (H) 0.40 - 1.24 mg/dL    Calcium  10.1 8.5 - 10.6 mg/dL    Total Protein 7.3 6.0 - 8.0 g/dL    Total Bilirubin 0.6 0.2 - 1.3 mg/dL    Albumin  4.0 3.5 - 5.0 g/dL    Alk Phosphatase 91 25 - 110 U/L    AST 14 7 - 40 U/L    ALT 15 7 - 56 U/L    CO2 25 21 - 30 mmol/L    Anion Gap 11 3 - 12    Glomerular Filtration Rate (GFR) 16 (L) >60 mL/min   PTT (APTT)    Collection Time: 12/13/23 12:21 PM   Result Value Ref Range    APTT 53.5 (H) 24.0 - 36.5 Seconds   PROTIME INR (PT)    Collection Time: 12/13/23 12:21 PM   Result Value Ref Range    Protime 14.5 (H) 9.9 - 14.2 Seconds    INR 1.3 (H) 0.9 - 1.2   URINALYSIS DIPSTICK REFLEX TO CULTURE    Collection Time: 12/13/23  2:34 PM    Specimen: Midstream; Urine   Result Value Ref Range    Color,UA Yellow     Turbidity,UA 1+ (A) Clear    Specific Gravity-Urine 1.012 1.005 - 1.030    pH,UA 6.0 5.0 - 8.0    Protein,UA 2+ (A) Negative    Glucose,UA Negative Negative    Ketones,UA Negative Negative    Bilirubin,UA Negative Negative    Blood,UA 1+ (A) Negative    Urobilinogen,UA Normal Normal    Nitrite,UA Negative Negative    Leukocytes,UA 3+ (A) Negative   URINALYSIS MICROSCOPIC REFLEX TO CULTURE    Collection Time: 12/13/23  2:34 PM    Specimen: Midstream; Urine   Result Value Ref Range    WBCs,UA Packed (A) None, 0 - 2  /HPF RBCs,UA 11 - 20 (A) None, 0 - 2  /HPF     Glucose: (!) 155 (12/13/23 1221)  Pertinent radiology reviewed.    Donnice JONETTA Blush, MD  Pager [phone]

## 2023-12-14 MED ORDER — LACTATED RINGERS IV SOLP
INTRAVENOUS | 0 refills | Status: DC
Start: 2023-12-14 — End: 2023-12-15
  Administered 2023-12-14 – 2023-12-15 (×2): 1000.0000 mL via INTRAVENOUS

## 2023-12-14 NOTE — Care Plan
 Problem: Discharge Planning  Goal: Participation in plan of care  Outcome: Goal Ongoing  Goal: Knowledge regarding plan of care  Outcome: Goal Ongoing  Goal: Prepared for discharge  Outcome: Goal Ongoing

## 2023-12-14 NOTE — Progress Notes
 General Progress Note    Name: Paul Benjamin        MRN: [id number]          DOB: 22-Aug-1945            Age: 78 y.o.  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     Acute on chronic kidney injury (CKD 4)  Urinary retention  - Follows with [place] nephrology (Dr.Adomako), baseline Cr ~2.6, underlying etiology is ADPKD  - Has h/o BPH, neurogenic bladder with chronic indwelling foley (and plans for SPT later)  - Cr 3.6 on admission  - UA with 3+ leuks, neg nitrites, 2+ protein, packed WBCs, 11-20 RBCs with indwelling foley  - No fever, leukocytosis, or other symptoms suggestive of UTI  or infected cyst  - Suspect etiology is prerenal azotemia in the setting of poor po intake and diuretic use  - Foley replaced in the ED  - Renal ultrasound consistent with known PKD. No hydronephrosis  - After 1L IVF creatinine improved to 3.1  - Will give gentle IVF today again  - Hold PTA lasix   - Daily BMPs  - Observe off of abx unless develops symptoms/signs of UTI, but will follow urine cx     Recent Covid-19  - Symptom onset 9/8, tested positive 9/12; now out of the window for remdesivir   - Normal O2 sats, no dyspnea  - CXR w/o acute findings, no clinical concern for PE and no evidence of superimposed bacterial pneumonia  - Procal normal  - Follow sputum cultures     Coronary artery disease  Hypertension  Dyslipidemia  SSS s/p PPM (2008)  - With NSTEMI 02/2023 s/p PCI of the RCA  - Last TTE 05/2023: EF 51%, akinesis of the basal inferoseptum and inferior walls with hypokinesis of the mid to apical inferior wall.  Grade 1 LV diastolic dysfunction.   - Continue aspirin , prasugrel (DAPT until 02/2024), atorvastatin   - Continue metoprolol  XL 25 mg daily  - Hold Lasix  due to AKI  - Ordered ECG for QTc     Paroxysmal a-fib  - Without apparent recurrence after ablation procedure in 2009. Not on anticoagulation  - Continue metoprolol      Type 2 diabetes  - Outpatient meds: Ozempic . Last A1c 5.8% 02/2023  - Will keep on LDCF     Hypothyroidism  - Continue levothyroxine      RLS  - Continue gabapentin , pramipexole      Gout  - Continue allopurinol      GERD  - Continue PPI    Hiccups  - Recurrent, persistent  - With h/o QTc prolongation can not use reglan or thorazine      VTE proph: SQH    FEN: 1L IVF, monitor lytes/replete prn, regular diet    Full code, confirmed on admission    Dispo: admit to inpatient, PT/OT consulted      High medical decision making due to the following: 1 acute or chronic illness that poses a threat to life or bodily function. Review of  notes outside of my specialty, review of each unique test, ordering of each unique test, assessment requiring an independent historian, independent interpretation of a test and discussion of management or test interpretation with physicians or other qualified health care professional outside of my specialty, drug therapy requiring intensive monitoring for toxicity and decision regarding hospitalization                               _______________________________________________________________________    Subjective   Paul Benjamin is a 78 y.o. male.  Patient stated that overall feels better: no fever, less short of breath, denies pain. Still has weakness, fatigue, and has persistent hiccups.        Medications  Scheduled Meds:allopurinoL  (ZYLOPRIM ) tablet 100 mg, 100 mg, Oral, QDAY  aspirin  chewable tablet 81 mg, 81 mg, Oral, QDAY  atorvastatin  (LIPITOR) tablet 40 mg, 40 mg, Oral, QHS  gabapentin  (NEURONTIN ) capsule 100 mg, 100 mg, Oral, QHS  heparin  (porcine) PF syringe 5,000 Units, 5,000 Units, Subcutaneous, Q8H  levothyroxine  (SYNTHROID ) tablet 75 mcg, 75 mcg, Oral, QDAY before breakfast  metoprolol  succinate XL (TOPROL  XL) tablet 25 mg, 25 mg, Oral, QDAY  pantoprazole  DR (PROTONIX ) tablet 40 mg, 40 mg, Oral, BID  pramipexole  (MIRAPEX ) tablet 0.75 mg, 0.75 mg, Oral, QHS  prasugreL HCl (EFFIENT) tablet 10 mg, 10 mg, Oral, QDAY    Continuous Infusions:  PRN and Respiratory Meds:acetaminophen  Q4H PRN, melatonin QHS PRN, ondansetron  Q6H PRN **OR** ondansetron  Q6H PRN, polyethylene glycol 3350  QDAY PRN, sennosides-docusate sodium  QDAY PRN        Objective                        Vital Signs: Last Filed                 Vital Signs: 24 Hour Range   BP: 114/71 (09/20 0424)  Temp: 36.3 ?C (97.3 ?F) (09/20 0424)  Pulse: 72 (09/20 0424)  Respirations: 18 PER MINUTE (09/20 0424)  SpO2: 99 % (09/20 0424)  O2%: 21 % (09/19 2341)  O2 Device: CPAP/BiPAP (09/20 0424) BP: (112-143)/(67-88)   Temp:  [35.6 ?C (96 ?F)-36.8 ?C (98.2 ?F)]   Pulse:  [70-84]   Respirations:  [12 PER MINUTE-19 PER MINUTE]   SpO2:  [97 %-100 %]   O2%:  [21 %]   O2 Device: CPAP/BiPAP     Vitals:    12/13/23 1043   Weight: 90.7 kg (200 lb)         Intake/Output Summary:  (Last 24 hours)    Intake/Output Summary (Last 24 hours) at 12/14/2023 0534  Last data filed at 12/14/2023 0429  Gross per 24 hour   Intake --   Output 720 ml   Net -720 ml              Physical Exam  General:  Alert, cooperative, no distress, appears stated age  HEENT:  Normocephalic, without obvious abnormality, atraumatic. Conjunctivae/corneas clear.  EOMs intact.  Lungs:  Clear to auscultation bilaterally, no wheezes or rales. Normal work of breathing on room air.  Heart: Normal rate, regular rhythm, S1 & S2 normal, no murmur rubs or gallops  Abdomen:  Soft, non-tender, nondistended, no rebound or guarding  Extremities:   2+ and symmetric radial pulses, no LE edema  Skin:   Skin color, texture, turgor normal.  No rashes or lesions on exposed skin.  GU: foley in place with  light yeloow urine  Neurologic: Non focal  Psychiatric: Calm, cooperative    Lab Review  Pertinent labs reviewed    Point of Care Testing  (Last 24 hours)  Glucose: (!) 147 (12/14/23 0432)  POC Glucose (Download): (!) 139 (12/13/23 2231)    Radiology and other Diagnostics Review:    Pertinent radiology reviewed.     Taneka Espiritu E [facility], MD   [facility] Pager [phone]

## 2023-12-14 NOTE — Progress Notes
 RT Adult Assessment Note    NAME:Drew NEMESIO CASTRILLON             MRN: [id number]             DOB:14-Mar-1946          AGE: 78 y.o.  ADMISSION DATE: 12/13/2023             DAYS ADMITTED: LOS: 1 day    Additional Comments:  Impressions of the patient: Patient lying comfortably wearing CPAP. NAD.  Intervention(s)/outcome(s): Incentive spirometry added for lung expansion.    Vital Signs:  Pulse: 72  RR: 18 PER MINUTE  SpO2: 99 %  O2 Device: CPAP/BiPAP  Liter Flow:    O2%: 21 %    Breath Sounds:   Right Apex Breath Sounds: Coarse crackles  Right Base Breath Sounds: Decreased  Left Apex Breath Sounds: Coarse crackles  Left Base Breath Sounds: Decreased  Respiratory Effort:   Respiratory Effort/Pattern: Unlabored  Comments:

## 2023-12-15 LAB — IRON + BINDING CAPACITY + %SAT+ FERRITIN
~~LOC~~ BKR % SATURATION: 42 % (ref 28–42)
~~LOC~~ BKR FERRITIN: 421 ng/mL — ABNORMAL HIGH (ref 30–300)
~~LOC~~ BKR IRON: 71 g/dL (ref 50–185)
~~LOC~~ BKR TRANSFERRIN: 114 mg/dL — ABNORMAL LOW (ref 185–336)

## 2023-12-15 LAB — ECG 12-LEAD
P-R INTERVAL: 266 ms
Q-T INTERVAL: 366 ms
QRS DURATION: 102 ms
QTC CALCULATION (BAZETT): 400 ms
R AXIS: -38 degrees
T AXIS: -24 degrees
VENTRICULAR RATE: 72 {beats}/min

## 2023-12-15 MED ORDER — FUROSEMIDE 20 MG PO TAB
20 mg | Freq: Every day | ORAL | 0 refills | Status: DC
Start: 2023-12-15 — End: 2023-12-17
  Administered 2023-12-15 – 2023-12-17 (×3): 20 mg via ORAL

## 2023-12-15 MED ORDER — CEFTRIAXONE INJ 1GM IVP
1 g | INTRAVENOUS | 0 refills | Status: DC
Start: 2023-12-15 — End: 2023-12-16
  Administered 2023-12-15: 22:00:00 1 g via INTRAVENOUS

## 2023-12-15 NOTE — Progress Notes
 OCCUPATIONAL THERAPY  ASSESSMENT NOTE      Name: Paul Benjamin   MRN: [id number]     DOB: May 16, 1945      Age: 78 y.o.  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  Significant Hospital Events: Foley  Mental / Cognitive: Alert;Oriented;Cooperative  Pain: No complaint of pain  Persons Present: Spouse  Comments: Pt received in bed, agreeable to OT. Pt returns to bed at end of session with all needs in reach.    Home Living Situation  Lives With: Spouse/significant other  Type of Home: House  Entry Stairs: No stairs  In-Home Stairs: No stairs  Bathroom Setup: Tub/shower unit  Patient Owned Equipment: Walker with wheels;Walker: Rollator  Comments: Pt and wife are living with their daughter in a home with no stairs. Daughter works from home. HH coming twice a week for wounds and other medical care.    Prior Level of Function  Level Of Independence: Independent with ADL and community mobility with device  Required Assist For: In and out of house;Transportation/driving;Medication/finance management  History of Falls in Past 3 Months: No  Occupation/Education: Retired    Information systems manager: Wears glasses all of the time    ADL's  Where Assessed: Edge of Bed;In [employer] Assist: Stand By Assist  Grooming Deficits: Supervision/Safety  LE Dressing Assist: Minimal Assist  LE Dressing Deficits: Don/Doff R Sock;Don/Doff L Sock  Toileting Assist: Minimal Assist  Toileting Deficits: Supervision/Safety;Grab Bar Use;Clothing Management Up;Clothing Management Down;Perineal Hygiene  Comment: Pt has a catheter, but did attempt to have a BM on toilet with no success.    ADL Mobility  Bed Mobility: Supine to Sit: Standby assist  Bed Mobility: Sit to Supine: Minimal assist  Transfer Type: Sit to stand  Transfer: Assistance Level: From;Bed;To;Toilet;Minimal assist  Transfer: Assistive Device: Roller walker  Transfer: Type of Assistance: For safety considerations  Other Transfer Type: Sit to/from stand  Other Transfer: Assistance Level: To/from;Toilet;Minimal assist  Other Transfer: Assistive Device: Roller walker  End of Activity Status: In bed;Instructed patient to request assist with mobility;Instructed patient to use call light  Sitting Balance: Static sitting balance;No UE support;Standby assist  Standing Balance: Dynamic standing balance;2 UE support;Minimal assist  Gait Distance: 30 feet  Gait: Assistance Level: Minimal assist  Gait: Assistive Device: Roller walker  Gait Comments: Ambulated from EOB to door, then to toilet, then back to bed.    Activity Tolerance  Endurance: 2/5 Tolerates 10-20 Minutes Exercise w/Multiple Rests    Cognition  Overall Cognitive Status: WFL to Adequately Complete Self Care Tasks Safely    Sensory  Overall Sensory: Pt Perceives Pressure in Both UEs in Gross Exam    Education  Persons Educated: Patient/Family  Topics: Role of OT, Goals for Therapy  Goal Formulation: With Patient/Family    Assessment  Assessment: Decreased ADL Status;Decreased UE Strength;Decreased Endurance;Decreased High-Level ADLs  Prognosis: Good  Goal Formulation: Pt/family    AM-PAC 6 Clicks Daily Activity Inpatient  Putting on and taking off regular lower body clothes: A Little  Bathing (Including washing, rinsing, drying): A Little  Toileting, which includes using toilet, bedpan, or urinal: A Little  Putting on and taking off regular upper body clothing: None  Taking care of personal grooming such as brushing teeth: None  Eating meals: None  Daily Activity Raw Score: 21  Standardized (T-scale) Score: 44.27    Plan  Progress: Improving as Expected  OT Frequency: 3-5x/week    ADL Goals  Patient Will Perform All ADL's: w/ Modified Independence    Functional Transfer Goals  Pt Will Perform All Functional Transfers: Modified Independent    OT Discharge Recommendations  Recommendation: Home with intermittent supervision/assistance  Patient Currently Requires Physical Assist With: All mobility;All personal care ADLs;All home functioning ADLs  Patient Currently Requires Equipment: Owns what is needed    Therapist: Anthonella Klausner, OTR/L  Date: 12/15/2023

## 2023-12-15 NOTE — Progress Notes
 PHYSICAL THERAPY  NOTE      Name: Anes Rigel   MRN: [id number]     DOB: 30-Apr-1945      Age: 78 y.o.  Admission Date: 12/13/2023     LOS: 2 days     Date of Service: 12/15/2023      Based on discussion with OT, patient's current level of function suggests that patient will progress with one discipline. PT will sign off at this time. Please re-consult if a change in functional status occurs.    Therapist: Jenkins Ada, PT  Date: 12/15/2023

## 2023-12-15 NOTE — Care Plan
 Problem: Discharge Planning  Goal: Participation in plan of care  Outcome: Goal Ongoing  Goal: Knowledge regarding plan of care  Outcome: Goal Ongoing  Goal: Prepared for discharge  Outcome: Goal Ongoing     Problem: High Fall Risk  Goal: High Fall Risk  Outcome: Goal Ongoing     Problem: Skin Integrity  Goal: Skin integrity intact  Outcome: Goal Ongoing  Goal: Healing of skin (Wound & Incision)  Outcome: Goal Ongoing  Goal: Healing of skin (Pressure Injury)  Outcome: Goal Ongoing

## 2023-12-15 NOTE — Progress Notes
 Float Pool END OF SHIFT/ JHFRAT NOTE    Admission Date: 12/13/2023    Acute events, interventions, provider communication: No     Patient Interventions and Education  Fall Risk/JHFRAT Interventions and Education: (Charting when applicable)  Elimination Interventions : Commode at bedside, Bed pan available in room, and Communicate timing of laxatives/diuretics with assistive personnel to support proactive elimination needs.   Medications : Bedside commode (i.e., urgency, frequency, dizziness) , Use of gait belt , Stay within arm's reach during toileting/showering (i.e., dizziness, orthostasis) , Educate patient on medication side effects, and Bed/chair alarm (i.e., change in mental status)   Patient Care Equipment: Needs assistance with patient care equipment when ambulating, Ensure environment is free of clutter and walkways are clear from tripping hazards, and Assess need for patient equipment and remove if not in use  Mobility: Assist x1, Gait belt in use when ambulating, Utilize walker, cane, or additional walking aid for ambulation, and Elimination equipment at bedside (urinal or commode)   Cognition: Bed/Chair Alarm  and Stay within arm's reach while patient ambulating/toileting/showering  Risk for Moderate/Major Injury: Age: >65 yrs, Risk for fracture, and Active Anticoagulation    2. Restraints:  No     Restraint Goal: Patient will be free from injury while physically restrained.  See Docflowsheet for restraint documentation, interventions, education, etc.    Intake and Output:       Intake/Output Summary (Last 24 hours) at 12/15/2023 1531  Last data filed at 12/15/2023 1529  Gross per 24 hour   Intake 2157.5 ml   Output 900 ml   Net 1257.5 ml              Last Bowel Movement Date:  (PTA)

## 2023-12-16 MED ORDER — LEVOFLOXACIN 250 MG PO TAB
250 mg | Freq: Every day | ORAL | 0 refills | Status: DC
Start: 2023-12-16 — End: 2023-12-17
  Administered 2023-12-17: 13:00:00 250 mg via ORAL

## 2023-12-16 MED ORDER — METOCLOPRAMIDE HCL 10 MG PO TAB
10 mg | Freq: Every day | ORAL | 0 refills | Status: DC | PRN
Start: 2023-12-16 — End: 2023-12-17

## 2023-12-16 MED ORDER — AMOXICILLIN-POT CLAVULANATE 875-125 MG PO TAB
875 mg | Freq: Two times a day (BID) | ORAL | 0 refills | Status: DC
Start: 2023-12-16 — End: 2023-12-16

## 2023-12-16 MED ORDER — MUPIROCIN 2 % TP OINT
Freq: Two times a day (BID) | TOPICAL | 0 refills | Status: DC
Start: 2023-12-16 — End: 2023-12-17
  Administered 2023-12-16: 15:00:00 via TOPICAL

## 2023-12-16 MED ORDER — LEVOFLOXACIN 500 MG PO TAB
500 mg | Freq: Once | ORAL | 0 refills | Status: CP
Start: 2023-12-16 — End: ?
  Administered 2023-12-16: 15:00:00 500 mg via ORAL

## 2023-12-16 NOTE — Care Plan
 Problem: High Fall Risk  Goal: High Fall Risk  Outcome: Goal Ongoing     Problem: Skin Integrity  Goal: Skin integrity intact  Outcome: Goal Ongoing  Goal: Healing of skin (Wound & Incision)  Outcome: Goal Ongoing  Goal: Healing of skin (Pressure Injury)  Outcome: Goal Ongoing

## 2023-12-16 NOTE — Case Management (ED)
 Case Management Admission Assessment    NAME:Paul Benjamin                          MRN: [id number]             DOB:08-Apr-1945          AGE: 78 y.o.  ADMISSION DATE: 12/13/2023             DAYS ADMITTED: LOS: 3 days      Today?s Date: 12/16/2023    Source of Information: patient, wife at bedside, and EMR       Plan  Plan: Case Management Assessment  This CM met with pt for assessment on this date.  Provided contact information and explanation of SW/NCM roles.  Reviewed [facility], Preparing for Discharge, and [facility] hand-outs.  Provided opportunity for questions and discussion. Pt/family encouraged to contact Case Management team with questions and concerns during hospitalization and until patient is able to transition back to the patient's primary care physician.  Patient and his wife recently moved from [place], [place], [place] to live with their daughter. The home is 2 levels and has ramp entry. Patient primarily stays on one level.   DME: pt has rollator, CPAP, and CGM  Pt was on service with [facility] Intake  [phone] fax [phone] PTA. NCM faxed admission H&P and will fax ROC orders at time of dc.   Pt has been to [facility] in the past.   At time of discharge, patient's wife can provide transportation. Pt and wife denied concerns with dc home.   NCM to continue to follow for discharge planning.     Patient Address/Phone  [address]  [place] [address]  [phone] (home)     Emergency Contact  Extended Emergency Contact Information  Primary Emergency Contact: Pownall,Lois  Address: [address]           [place], [place] [address] [place]   Home Phone: [phone]  Work Phone: [phone]  Relation: Daughter  Secondary Emergency Contact: Closs,Anna  Address: [address]           [place], [place] [address] [place]   Home Phone: [phone]  Mobile Phone: [phone]  Relation: Spouse    Healthcare Directive  Healthcare Directive: Yes, patient has a healthcare directive  Would patient like to fill out a (a new) Healthcare Directive?: No, patient declined  Psych Advance Directive (Psych unit only): No, patient does not have a Social research officer, government  Does the Patient Need Case Management to Arrange Discharge Transport? (ex: facility, ambulance, wheelchair/stretcher, Medicaid, cab, other): No  Will the Patient Use Family Transport?: Yes  Transportation Name, Phone and Availability #1: Wife    Expected Discharge Date  12/17/2023     Living Situation Prior to Admission  Living Arrangements  Type of Residence: Home, independent  Living Arrangements: Spouse/significant other, Children (Lives with wife and adult daughter)  Financial risk analyst / Tub: Tub/Shower Unit  How many levels in the residence?: 2  Can patient live on one level if needed?: Yes  Does residence have entry and/or inside stairs?: No (Ramp entry)  Assistance needed prior to admit or anticipated on discharge: Yes  Who provides assistance or could if needed?: Wife and daughter  Are they in good health?: Yes  Level of Function   Prior level of function: Independent  Cognitive Abilities   Cognitive Abilities: Alert and Oriented, Engages in problem solving and planning, Participates in decision making    Financial Resources  Coverage  Primary Insurance: Medicare  Secondary Insurance: Medicare Supplement  Additional Coverage: None  Medication Coverage    Medication Coverage: Medicare Part D  Have you experienced a noticeable increase in your copay costs recently?: No  Are current medications affordable?: Yes  Do You Use a Co-Pay Card or a Medication Assistance Program to Help Manage Medication Costs?: No  Do You Manage Your Own Medications?: Yes  Source of Income   Source Of Income: SSI  Financial Assistance Needed?  Medications are affordable at this time.     Psychosocial Needs  Mental Health  Mental Health History: No  Substance Use History  Substance Use History Screen: No  Other  N/a    Current/Previous Services  PCP  Kyung Goodell, [phone], [phone]  Patient confirmed they are current with the above PCP, however they are working to switch PCPs since they recently moved. MPY provider to send referral for [place] PCP. Pt plans to see PCP in October then hopefully their next visit will be with a new PCP closer to [facility].   Pharmacy    [facility]  [address]  [place]  [place] [address]  Phone: [phone] Fax: [phone]    MEDICATION ASSISTANCE [facility]  [address]  [place] [address]  Phone: [phone] Fax: [phone]    [facility] [address]), [place] - [address] [place]) [place] [address]  Phone: [phone] Fax: [phone]    Durable Medical Equipment   Durable Medical Equipment at home: Rollator, CPAP/BiPAP, Other (comment) (CGM)  Home Health  Receiving home health: Yes  Agency name: [facility]  Would patient use this agency again?: Yes  Hemodialysis or Peritoneal Dialysis  Undergoing hemodialysis or peritoneal dialysis: No  Tube/Enteral Feeds  Receive tube/enteral feeds: No  Infusion  Receive infusions: No  Private Duty  Private duty help used: No  Home and Community Based Services  Home and community based services: No  Ryan White  Ryan White: N/A  Hospice  Hospice: No  Outpatient Therapy  PT: No  OT: No  SLP: No  Skilled Nursing Facility/Nursing Home  SNF: No  NH: No  Inpatient Rehab  IPR: In the past  When did patient receive care?: 02/2023  Name of Facility: [facility]  Would patient return for future services?: Yes  [facility]  LTACH: No  Acute Hospital Stay  Acute Hospital Stay: In the past  Was patient's stay within the last 30 days?: No      Rhoda Jasmine, BSN, RN   Nurse Case Manager   [phone]  Available on Voalte

## 2023-12-16 NOTE — Care Plan
 Problem: Discharge Planning  Goal: Participation in plan of care  Outcome: Goal Ongoing  Goal: Knowledge regarding plan of care  Outcome: Goal Ongoing  Goal: Prepared for discharge  Outcome: Goal Ongoing     Problem: High Fall Risk  Goal: High Fall Risk  Outcome: Goal Ongoing     Problem: Skin Integrity  Goal: Skin integrity intact  Outcome: Goal Ongoing  Goal: Healing of skin (Wound & Incision)  Outcome: Goal Ongoing  Goal: Healing of skin (Pressure Injury)  Outcome: Goal Ongoing

## 2023-12-16 NOTE — Consults
 Wound Ostomy Note    NAME:Paul Benjamin                                                                   MRN: [id number]                 DOB:[birth year]          AGE: 78 y.o.  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     Wife, therisa, at bedside. Patient pleasantly interactive.   Brief on. One person assist to turn on Stryker bed. Air pump with Stryker bed.     Right buttock stage 2 PI. Red flaky pale purple wound base. Surrounding skin intact nonblanchable.   Left buttock without skin breakdown.  Patient encouraged to offload area by turning with FOAM WEDGES/ BED TURN ASSIST , shift weight every 15 minute in chair position, good nutrition (increased protein) and good wound care.    Wife states HH using foam pad dressing 2x per week. She prefers to continue using foam pad dressings vs Barrier creams TID.     BLE with 1+ edema. Using Elta cream Daily.   Small intact blister on Right leg. RLE with intact red rash. No open skin noted. Bactroban  ointment BID already ordered by MD.   Wife states history of BLE edema and blister/ ulcers. Discussed elevating legs to minimize edema. He wears Compression grip but not on at this time.   Discussed home care if leg blisters/ ulcers worsen. Recommend to use silver Silicone Contact Layer (urgotul) and foam pad dressing with silicone border every 72 hours. Supplies given. Recommend:     Buttocks:   OFFLOAD- TURN with Foam Wedges or Bed Turn assist   Foam pad dressing  Change every 72 hours.     - If pt is on a Stryker bed, add air pump to upgrade mattress to a low air loss support surface and to optimize microclimate for skin.  - Implement/continue turning schedule with turn support via bed mechanism (if available) or using foam wedge support.   - Avoid briefs when possible. Use only one underpad at a time underneath patient (do not stack/layer pads) to prevent heat/moisture trapping against skin.  - Position HOB no greater than 30 degrees, unless positioning is unsafe for pt's condition, to reduce friction/shear over sacrum/coccyx.  - Place foam egg crate cushion in seat prior to pt spending time out of bed in chair.  - Recommend q15 min weight shifts (lean to left/right) to alleviate pressure when seated.  - Place foam offloading boots to lower  extremities to alleviate pressure on heels.  - Consider dietician consult (f not already ordered) to address wound healing needs.  - Assess and reposition medical devices to alleviate pressure and tension on skin.        IF BLE ulcers develop:   Elevate Legs  Clean the wound with Normal Saline and 4x4 gauze. Pat dry.  If skin flap is still present, gently role flap back over wound base.  Cover wound with Silver Silicone Contact Layer (Urgotul)  Foam Mepilex dressing with silicone border  Change q72 hrs and PRN.   BLE Compression           Wound Team will sign off. If affected area does not improve despite consistent implementation of treatment plan listed, Please contact our service via Voalte to discuss concerns.       Wounds Pressure injury Right Buttocks (Active)   12/15/23 1637   Wound Type: Pressure injury   Orientation: Right   Location: Buttocks   Wound Location Comments:    Initial Wound Site Closure:    Initial Dressing Placed:    Initial Cycle:    Initial Suction Setting (mmHg):    Pressure Injury Stages: Stage 2   Pressure Injury Present Within 24 Hours of Hospital Admission: Yes   If This Pressure Injury Is Suspected to Be Device Related, Please Select the Device::    Is the Wound Open or Closed:      Wound Assessment Moist;Red;Pink;Purple;Non-blanchable 12/16/23 1320   Peri-wound Assessment Intact;Purple;Red 12/16/23 1320   Wound Drainage Amount None 12/16/23 1320   Wound Dressing Status None/open to air 12/16/23 1320   Wound Length (cm) (Wound Team Only) 4 cm 12/16/23 1320   Wound Width (cm) (Wound Team Only) 2.5 cm 12/16/23 1320   Wound Depth (cm) (Wound Team Only) 0.1 cm 12/16/23 1320   Wound Surface Area (cm^2) (Wound Team Only) 7.85 cm^2 12/16/23 1320   Wound Volume (cm^3) (Wound Team Only) 0.524 cm^3 12/16/23 1320   Number of days: 1       Wounds Blister (Not pressure) Anterior;Right;Lower Leg (Active)   12/15/23 1600   Wound Type: Blister (Not pressure)   Orientation: Anterior;Right;Lower   Location: Leg   Wound Location Comments:    Initial Wound Site Closure:    Initial Dressing Placed:    Initial Cycle:    Initial Suction Setting (mmHg):    Pressure Injury Stages:    Pressure Injury Present Within 24 Hours of Hospital Admission:    If This Pressure Injury Is Suspected to Be Device Related, Please Select the Device::    Is the Wound Open or Closed:    Wound Assessment Red;Dry 12/16/23 1320   Peri-wound Assessment Rash;Dry;Intact 12/16/23 1320   Wound Drainage Amount None 12/16/23 1320   Wound Dressing Status None/open to air 12/16/23 1320   Number of days: 1       Marcey Pan RN, BSN  Wound/ Ostomy Team Nursing Consult Service  Available via [employer] Text  M-F 07:00-15:30  Office number: [phone]  Contact after hours assistance after 4 pm M-F/weekends/ holidays

## 2023-12-17 ENCOUNTER — Encounter: Admit: 2023-12-17 | Discharge: 2023-12-17 | Payer: MEDICARE

## 2023-12-17 ENCOUNTER — Inpatient Hospital Stay: Admit: 2023-12-13 | Discharge: 2023-12-17 | Payer: MEDICARE

## 2023-12-17 DIAGNOSIS — D696 Thrombocytopenia, unspecified: Secondary | ICD-10-CM

## 2023-12-17 DIAGNOSIS — D631 Anemia in chronic kidney disease: Secondary | ICD-10-CM

## 2023-12-17 DIAGNOSIS — Q612 Polycystic kidney, adult type: Secondary | ICD-10-CM

## 2023-12-17 DIAGNOSIS — I495 Sick sinus syndrome: Secondary | ICD-10-CM

## 2023-12-17 DIAGNOSIS — G2581 Restless legs syndrome: Secondary | ICD-10-CM

## 2023-12-17 DIAGNOSIS — I480 Paroxysmal atrial fibrillation: Secondary | ICD-10-CM

## 2023-12-17 DIAGNOSIS — Z950 Presence of cardiac pacemaker: Secondary | ICD-10-CM

## 2023-12-17 DIAGNOSIS — I2510 Atherosclerotic heart disease of native coronary artery without angina pectoris: Secondary | ICD-10-CM

## 2023-12-17 DIAGNOSIS — N390 Urinary tract infection, site not specified: Secondary | ICD-10-CM

## 2023-12-17 DIAGNOSIS — Z87891 Personal history of nicotine dependence: Secondary | ICD-10-CM

## 2023-12-17 DIAGNOSIS — K219 Gastro-esophageal reflux disease without esophagitis: Secondary | ICD-10-CM

## 2023-12-17 DIAGNOSIS — N400 Enlarged prostate without lower urinary tract symptoms: Secondary | ICD-10-CM

## 2023-12-17 DIAGNOSIS — U071 COVID-19: Secondary | ICD-10-CM

## 2023-12-17 DIAGNOSIS — Z955 Presence of coronary angioplasty implant and graft: Secondary | ICD-10-CM

## 2023-12-17 DIAGNOSIS — K2270 Barrett's esophagus without dysplasia: Secondary | ICD-10-CM

## 2023-12-17 DIAGNOSIS — I252 Old myocardial infarction: Secondary | ICD-10-CM

## 2023-12-17 DIAGNOSIS — E039 Hypothyroidism, unspecified: Secondary | ICD-10-CM

## 2023-12-17 DIAGNOSIS — M109 Gout, unspecified: Secondary | ICD-10-CM

## 2023-12-17 DIAGNOSIS — T83511A Infection and inflammatory reaction due to indwelling urethral catheter, initial encounter: Secondary | ICD-10-CM

## 2023-12-17 DIAGNOSIS — N184 Chronic kidney disease, stage 4 (severe): Secondary | ICD-10-CM

## 2023-12-17 DIAGNOSIS — E1122 Type 2 diabetes mellitus with diabetic chronic kidney disease: Secondary | ICD-10-CM

## 2023-12-17 DIAGNOSIS — L89302 Pressure ulcer of unspecified buttock, stage 2: Secondary | ICD-10-CM

## 2023-12-17 DIAGNOSIS — N319 Neuromuscular dysfunction of bladder, unspecified: Secondary | ICD-10-CM

## 2023-12-17 DIAGNOSIS — I130 Hypertensive heart and chronic kidney disease with heart failure and stage 1 through stage 4 chronic kidney disease, or unspecified chronic kidney disease: Secondary | ICD-10-CM

## 2023-12-17 DIAGNOSIS — Z9049 Acquired absence of other specified parts of digestive tract: Secondary | ICD-10-CM

## 2023-12-17 MED ORDER — MUPIROCIN 2 % TP OINT
Freq: Two times a day (BID) | TOPICAL | 0 refills | 11.00000 days | Status: AC
Start: 2023-12-17 — End: ?
  Filled 2023-12-17: qty 22, 14d supply, fill #0

## 2023-12-17 MED ORDER — LEVOFLOXACIN 250 MG PO TAB
250 mg | ORAL_TABLET | Freq: Every day | ORAL | 0 refills | 7.00000 days | Status: AC
Start: 2023-12-17 — End: ?
  Filled 2023-12-17: qty 3, 3d supply, fill #0

## 2023-12-17 NOTE — Care Plan
 Problem: Discharge Planning  Goal: Participation in plan of care  Outcome: Goal Achieved  Goal: Knowledge regarding plan of care  Outcome: Goal Achieved  Goal: Prepared for discharge  Outcome: Goal Achieved     Problem: High Fall Risk  Goal: High Fall Risk  Outcome: Goal Achieved     Problem: Skin Integrity  Goal: Skin integrity intact  Outcome: Goal Achieved  Goal: Healing of skin (Wound & Incision)  Outcome: Goal Achieved  Goal: Healing of skin (Pressure Injury)  Outcome: Goal Achieved

## 2023-12-17 NOTE — Progress Notes
 RT Adult Assessment Note    NAME:Paul Benjamin             MRN: [id number]             DOB:[birth year]          AGE: 78 y.o.  ADMISSION DATE: 12/13/2023             DAYS ADMITTED: LOS: 4 days    Additional Comments:  Impressions of the patient: Patient in bed and not in respiratory distress.    Intervention(s)/outcome(s): Continue treatment plan as ordered.   Patient education that was completed: NA  Recommendations to the care team: NA    Vital Signs:  Pulse: 73  RR: 18 PER MINUTE  SpO2: 94 %  O2 Device: None (Room air)  Liter Flow:    O2%:      Breath Sounds:   Right Apex Breath Sounds: Clear (Implies normal)  Right Base Breath Sounds: Decreased  Left Apex Breath Sounds: Clear (Implies normal)  Left Base Breath Sounds: Decreased  Respiratory Effort: Unlabored  Respiratory WDL: Within Defined Limits  Respiratory Effort/Pattern: Unlabored  Comments:

## 2023-12-17 NOTE — Care Plan
 Problem: Discharge Planning  Goal: Participation in plan of care  Outcome: Goal Ongoing  Goal: Knowledge regarding plan of care  Outcome: Goal Ongoing  Goal: Prepared for discharge  Outcome: Goal Ongoing     Problem: High Fall Risk  Goal: High Fall Risk  Outcome: Goal Ongoing     Problem: Skin Integrity  Goal: Skin integrity intact  Outcome: Goal Ongoing  Goal: Healing of skin (Wound & Incision)  Outcome: Goal Ongoing  Goal: Healing of skin (Pressure Injury)  Outcome: Goal Ongoing

## 2023-12-17 NOTE — Discharge Instructions - Pharmacy
 Discharge Summary      Name: Paul Benjamin  Medical Record Number: [id number]        Account Number:  [id number]  Date Of Birth:  11-11-1945                         Age:  78 y.o.  Admit date:  12/13/2023                     Discharge date: 12/17/2023      Discharge Attending:  Clarise Cypress, MD  Discharge Summary Completed By: Lidwina Kaner E Emyah Roznowski, MD    Service: Med Private [phone]    Reason for hospitalization:  Acute kidney injury superimposed on chronic kidney disease [N17.9, N18.9]    Primary Discharge Diagnosis:   Same as Above    Hospital Diagnoses:  Hospital Problems        Active Problems    * (Principal) Acute kidney injury superimposed on chronic kidney disease    AKI (acute kidney injury)     Present on Admission:   Acute kidney injury superimposed on chronic kidney disease        Significant Past Medical History        Abnormal involuntary movement  Arthritis  Atrial fibrillation (CMS-HCC)  CAD (coronary artery disease)  Cataract      Comment:  Removed  CHF (congestive heart failure) (CMS-HCC)  Chronic kidney disease      Comment:  Poly cystic kidney disease  Colon polyps  Coronary artery disease (CAD)      Comment:  s/p PCI LAD  Enlarged prostate  Erectile dysfunction  Gout  Heart attack (CMS-HCC)  HX: anticoagulation      Comment:  Baby aspirin   Hypercholesterolemia  Hypertension  Hypertriglyceridemia  Incontinence  Infection  Kidney disease  Myocardial infarction (CMS-HCC)  Obesity  Sleep apnea  SSS (sick sinus syndrome) (CMS-HCC)      Comment:  dual chamber PPM  / [employer]  Type II diabetes mellitus (CMS-HCC)  Unspecified deficiency anemia  Urinary tract infection    Allergies   Ciprofloxacin, Lisinopril, Ceftriaxone , Cefuroxime, Clindamycin, Plavix [clopidogrel], Sulfa (sulfonamide antibiotics), and Quinine    Brief Hospital Course   The patient was admitted and the following issues were addressed during this hospitalization: (with pertinent details including admission exam/imaging/labs). 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 Has h/o BPH, neurogenic bladder with chronic indwelling foley (and plans for SPT later). Cr 3.6 on admission. UA with 3+ leuks, neg nitrites, 2+ protein, packed WBCs, 11-20 RBCs with indwelling foley. No fever, leukocytosis. Suspect etiology is prerenal azotemia in the setting of poor po intake and diuretic use. Foley replaced in the ED. Renal ultrasound consistent with known PKD. No hydronephrosis. After gentle IVF creatinine improved to baseline. Urine culture with Serratia and Proteus mirabilis, pan susceptible. Was started rocephin  9/21, noted rash on LE this am so will switch to levaquin  (tolerated in past) and monitor. Will plan for 5 days     Recent COVID-19: Symptom onset 9/8, tested positive 9/12; now out of the window for remdesivir . Normal O2 sats, no dyspnea. CXR w/o acute findings, no clinical concern for PE and no evidence of superimposed bacterial pneumonia. Procal normal     Coronary artery disease / Hypertension  Dyslipidemia/ SSS s/p PPM (2008) With NSTEMI 02/2023 s/p PCI of the RCA. Last TTE  05/2023: EF 51%, akinesis of the basal inferoseptum and inferior walls with hypokinesis of the mid to apical inferior wall.  Grade 1 LV diastolic dysfunction. Continue aspirin , prasugrel (DAPT until 02/2024), atorvastatin . Continue metoprolol  XL 25 mg daily. Resumed PTA lasix  as AKI resolved     Paroxysmal a-fib: Without apparent recurrence after ablation procedure in 2009. Not on anticoagulation. Continue metoprolol      Anemia of kidney disease / Thrombocytopenia: Was on darbepoetin q2 weeks with Hgtb>11, now on hold. Noted trend down with Hgb 8.7. Could be dilutional due to IVF. Improved with no intervention. Iron  studies with Fe 71, decreased TIBC, high ferritin. Denies melena. Noted that had EGD in 02/2023 with findings of duodenitis and Barrett's mucosa. At that point GI DrSamo recommended against further endoscopies given age and comorbidities     Type 2 diabetes: Outpatient meds: Ozempic . Last A1c 5.8% 02/2023     Hypothyroidism: Continue levothyroxine      RLS: Continue gabapentin , pramipexole      Gout: Continue allopurinol      GERD: Continue PPI    Day of discharge exam notable for:   General:  Alert, cooperative, no distress, appears stated age  HEENT:  Normocephalic, without obvious abnormality, atraumatic. Conjunctivae/corneas clear.  EOMs intact.  Lungs:  Clear to auscultation bilaterally, no wheezes or rales. Normal work of breathing on room air.  Heart: Normal rate, regular rhythm, S1 & S2 normal, no murmur rubs or gallops  Abdomen:  Soft, non-tender, nondistended, no rebound or guarding  Extremities:   2+ and symmetric radial pulses, no LE edema  Skin:   Skin color, texture, turgor normal.  No rashes or lesions on exposed skin.  GU: foley in place with light yeloow urine  Neurologic: Non focal  Psychiatric: Calm, cooperative    Items Needing Follow Up   Pending items or areas that need to be addressed at follow up: none    Pending Labs and Follow Up Radiology    Pending labs and/or radiology review at this time of discharge are listed below: Please note- any labs with collected status will not have a result; if this area is blank, there are no items for review.   Pending Labs       Order Current Status    CULTURE-URINE W/SENSITIVITY Preliminary result              Medications      Medication List      START taking these medications     levoFLOXacin  250 mg tablet; Commonly known as: LEVAQUIN ; Dose: 250 mg;   Take one tablet by mouth daily for 3 days. Indications: genitourinary   tract infections; For: genitourinary tract infections; Quantity: 3 tablet;   Refills: 0; Start taking on: December 18, 2023   mupirocin  2 % topical ointment; Commonly known as: BACTROBAN ; Apply    topically to affected area twice daily. Indications: minor skin infection   due to bacteria; For: minor skin infection due to bacteria; Quantity: 22   g; Refills: 0     CONTINUE taking these medications     allopurinoL  100 mg tablet; Commonly known as: ZYLOPRIM ; Dose: 100 mg;   Take one tablet by mouth daily. Take with food.  Indications: treatment to   prevent acute gout attack; For: treatment to prevent acute gout attack;   Refills: 0   aspirin  81 mg chewable tablet; Dose: 81 mg; Chew one tablet by mouth   daily. Indications: treatment to prevent a heart attack; For: treatment to  prevent a heart attack; Refills: 0   atorvastatin  40 mg tablet; Commonly known as: LIPITOR; Dose: 40 mg; Take   one tablet by mouth at bedtime daily. Indications: hardening of the   arteries due to plaque buildup; For: hardening of the arteries due to   plaque buildup; Quantity: 90 tablet; Refills: 3   CHOLEcalciferoL  (vitamin D3) 50,000 units capsule; Commonly known as:   OPTIMAL D3; Dose: 50,000 Units; Refills: 0   clotrimazole  1 % topical cream; Commonly known as: LOTRIMIN ; Apply    topically to affected area twice daily. Indications: a skin infection due   to the fungus Candida; For: a skin infection due to the fungus Candida;   Refills: 0   furosemide  20 mg tablet; Commonly known as: LASIX ; Dose: 20 mg; Take one   tablet by mouth twice daily.; Quantity: 200 tablet; Refills: 3   gabapentin  100 mg capsule; Commonly known as: NEURONTIN ; Dose: 1   capsule; Refills: 0   ketoconazole  2 % topical cream; Commonly known as: NIZORAL ; Apply    topically to affected area twice daily. Indications: a skin infection due   to the fungus Candida; For: a skin infection due to the fungus Candida;   Refills: 0   levothyroxine  75 mcg tablet; Commonly known as: SYNTHROID ; Dose: 75 mcg;   Refills: 0   metoprolol  succinate XL 25 mg extended release tablet; Commonly known   as: TOPROL  XL; Dose: 25 mg; Take one tablet by mouth daily.; Quantity: 90   tablet; Refills: 3   nitroglycerin  0.4 mg tablet; Commonly known as: NITROSTAT ; Dose: 0.4 mg;   Place 1 tablet under tongue every 5 minutes as needed for Chest Pain.;   Quantity: 25 tablet; Refills: 1   OZEMPIC  1 mg/dose (4 mg/3 mL) injection PEN; Generic drug: semaglutide ;   Dose: 1 mg; Inject 1 mg under the skin every 7 days.; Quantity: 9 mL;   Refills: 2   pantoprazole  DR 40 mg tablet; Commonly known as: PROTONIX ; Dose: 40 mg;   Take 1 tablet by mouth twice daily; Quantity: 90 tablet; Refills: 1   pramipexole  1.5 mg tablet; Commonly known as: MIRAPEX ; Dose: 1.5 mg;   Refills: 0   prasugreL HCl 10 mg tablet; Commonly known as: EFFIENT; Dose: 10 mg;   Take one tablet by mouth daily.; Quantity: 90 tablet; Refills: 3   traMADoL  50 mg tablet; Commonly known as: ULTRAM ; Dose: 1 tablet;   Refills: 0       Return Appointments and Scheduled Appointments     Scheduled appointments:      Dec 20, 2023 12:45 PM  Office visit with Charlie CHRISTELLA Morgan, MD  Neurology: [facility] (Neurology) [address]  [place]  [place] [address]  [phone]     Dec 26, 2023 3:15 PM  Follow-up visit with Hendrick LITTIE Kelp, MD  Urology: [facility] (Urology) [address]  [address]  [place]  [place] [address]  [phone]     Jan 22, 2024 10:40 AM  Office visit with Durwood DELENA Dunker, MD  Nephrology: Medical [facility] (Internal Medicine) [address]  [address]  [place]  [place] [address]  [phone]     Jan 29, 2024 3:30 PM  Follow-up visit with Hendrick LITTIE Kelp, MD  Urology: [facility] (Urology) [address]  [address]  [place]  [place] [address]  [phone]     Feb 03, 2024 2:30 PM  Office visit with Alan MARLA Pouch, APRN-NP  Endocrinology: Medical [facility] (Internal Medicine) [address]  [address]  [place]  [place] [address]  [phone]     Feb 26, 2024 9:00 AM  Office visit with Durwood DELENA Dunker, MD  Nephrology: Medical [facility] (Internal Medicine) [address]  [address]  [place]  [place] [address]  [phone]     Feb 26, 2024 3:30 PM  Follow-up visit with Hendrick LITTIE Kelp, MD  Urology: [facility] (Urology) [address]  [address]  [place]  [place] [address]  [phone]     Jun 04, 2024 3:30 PM  Office visit with Hendrick LITTIE Kelp, MD  Urology: [facility] (Urology) [address]  [address]  [place]  [place] [address]  [phone]          Contact information for after-discharge care                [facility]       [facility] - [place]    Phone: [phone]    Fax: [phone]    Where: [address] [address]    Service: Home Health Services                  Consults, Procedures, Diagnostics, Micro, Pathology   Consults: None  Surgical Procedures & Dates: None  Significant Diagnostic Studies, Micro and Procedures: noted in brief hospital course  Significant Pathology: noted in brief hospital course     Wound:      Wounds Pressure injury Right Buttocks (Active)   12/15/23 1637   Wound Type: Pressure injury   Orientation: Right   Location: Buttocks   Wound Location Comments:    Initial Wound Site Closure:    Initial Dressing Placed:    Initial Cycle:    Initial Suction Setting (mmHg):    Pressure Injury Stages: Stage 2   Pressure Injury Present Within 24 Hours of Hospital Admission: Yes   If This Pressure Injury Is Suspected to Be Device Related, Please Select the Device::    Is the Wound Open or Closed:    Image   12/15/23 1600   Wound Assessment Moist;Red;Pink;Purple;Non-blanchable 12/17/23 0546   Peri-wound Assessment Intact;Purple;Red 12/17/23 0546   Wound Drainage Amount None 12/17/23 0546   Wound Dressing Status None/open to air 12/17/23 0546   Wound Care Treatment or ointment applied 12/16/23 0858   Wound Dressing and/or Treatment Barrier cream 12/16/23 0858   Wound Length (cm) (Wound Team Only) 4 cm 12/16/23 1320   Wound Width (cm) (Wound Team Only) 2.5 cm 12/16/23 1320   Wound Depth (cm) (Wound Team Only) 0.1 cm 12/16/23 1320   Wound Surface Area (cm^2) (Wound Team Only) 7.85 cm^2 12/16/23 1320   Wound Volume (cm^3) (Wound Team Only) 0.524 cm^3 12/16/23 1320   Number of days: 2       Wounds Blister (Not pressure) Anterior;Right;Lower Leg (Active)   12/15/23 1600   Wound Type: Blister (Not pressure)   Orientation: Anterior;Right;Lower   Location: Leg   Wound Location Comments:    Initial Wound Site Closure:    Initial Dressing Placed:    Initial Cycle:    Initial Suction Setting (mmHg):    Pressure Injury Stages:    Pressure Injury Present Within 24 Hours of Hospital Admission:    If This Pressure Injury Is Suspected to Be Device Related, Please Select the Device::    Is the Wound Open  or Closed:    Wound Assessment Red;Dry 12/17/23 0546   Peri-wound Assessment Rash;Dry;Intact 12/17/23 0546   Wound Drainage Amount None 12/17/23 0546   Wound Dressing Status None/open to air 12/17/23 0546   Number of days: 2                        Discharge Disposition, Condition   Patient Disposition: Home Health Care Svc [06]  Condition at Discharge: Stable    Code Status   Full Code    Patient Instructions     Activity       Activity as Tolerated   As directed      It is important to keep increasing your activity level after you leave the hospital.  Moving around can help prevent blood clots, lung infection (pneumonia) and other problems.  Gradually increasing the number of times you are up moving around will help you return to your normal activity level more quickly.  Continue to increase the number of times you are up to the chair and walking daily to return to your normal activity level. Begin to work toward your normal activity level at discharge          Diet       Regular Diet   As directed      You have no dietary restriction. Please continue with a healthy balanced diet.          Wounds/Lines/Drains       Urinary/Suprapubic Catheter Care   As directed      General Urinary Catheter Care: Home Care Instructions    *Keep the catheter secured to your leg at all times.   *You can shower with the catheter.  Do not take baths.   *You may notice some blood- or pink-tinged urine. If this happens, relax, sit down, and drink water .  If you are very active, this can happen and it is normal.   *If you are concerned about your catheter, it stops draining, or if you are passing dark bloody urine (red wine appearance), please call your doctor.    Supplies to help you with your home care:  *Bacitracin, used as directed by doctor  *Leg bag x 1  *Overnight bag x 1  *Statlock to secure your catheter x 2             Discharge education provided to patient., Signs and Symptoms:   Report these signs and symptoms       Report These Signs and Symptoms   As directed      Please contact your doctor if you have any of the following symptoms: temperature higher than 100.4 degrees F, uncontrolled pain, persistent nausea and/or vomiting, or difficulty breathing        , Education: , and Others Instructions:   Other Orders       Questions About Your Stay   Complete by: As directed      Discharging attending physician: Chenise Mulvihill E    Order comments: If you have an emergency after discharge, please dial [phone].    You may contact your discharging physician up to 7 days after discharge for questions about your hospitalization, discharge instructions, or medications by calling [phone] during regular business hours (8AM-4PM) and asking to speak to the doctor listed on the discharge information.       If you are not calling during business hours, ask for the on-call doctor.    If you have  new  or worsening symptoms, you may be directed to your primary care provider (PCP) for ongoing questions, an Emergency Department, or an Urgent Care Clinic for a more immediate evaluation.    For all calls or questions more than 7 days after discharge, please contact your primary care provider (PCP).    For medications after discharge: pain (opioid) medicine cannot be refilled or prescribed by calling your discharging physician.  These medications need to be filled by your primary care provider (PCP).  Regular refill requests should be directed to your primary care provider (PCP).            Additional Orders: Case Management, Supplies, Home Health     Home Health/DME                HOME HEALTH/DME  ONCE        Comments: Home Health/Durable Medical Equipment Order Details    Patient Name:  Paul Benjamin                   Medical Record Number:   [id number]    Patient Diagnosis & ICD 10 Codes: Acute kidney injury superimposed on chronic kidney disease ICD-10-CM: N17.9, N18.9  Hypertension [I10]    Hypercholesterolemia [E78.00]    Coronary artery disease (CAD) [I25.10]    Atrial fibrillation (CMS-HCC) [I48.91]    Type II diabetes mellitus (CMS-HCC) [E11.9]    CAD (coronary artery disease) [I25.10]    Kidney disease [N28.9]    CHF (congestive heart failure) (CMS-HCC) [I50.9]    Myocardial infarction (CMS-HCC) [I21.9]       Patient Height: 177.8 cm (5' 10)   Patient Weight: 90.7 kg (200 lb)    Provider Information:  MD Name: Clarise Cypress  MD Phone Number: [phone]  MD Fax Number: [phone]  MD NPI: [id number]    Agency Instructions:     Patient now stable for discharge. Please resume the following disciplines: SN  Please resume care within 24-48 hours of hospital discharge. RN to complete general assessment, including vitals with temperature, monitor and teach patient and/or caregiver medication management and compliance, monitor and teach pain management and pain medication compliance when necessary. Monitor and teach signs and symptoms of infection, monitor and teach disease management, including signs and symptoms of diet, who and when to call, hospital readmission avoidance. Please see attached AVS for additional discharge instruction.    Patient has indwelling 14 FR coude catheter that was placed on 12/13/2023. Please provide catheter maintenance and care and provide education to patient and family on caring for foley catheter. Please provide the supplies necessary for catheter care.     Clinical findings to support homebound status: Poor tolerance for activity  Clinical findings to support home care services: Deficit in ADL's    I certify that this patient is under my care and that I, or a nurse practitioner or physician's assistant working with me, had a face-to-face encounter that meets the physician's face-to-face encounter requirements with this patient on 12/17/2023.    This patient is under my care, and I have initiated the establishment of the plan of care.  This patient will be followed by a physician after discharge, who will periodically review the plan of care.   Question Answer Comment   Attending Name/Contact Jakai Risse    PCP Name/Contact Ethel Bruns Phone: [phone] Fax: [phone]    Home Health to Follow PCP  Discharge Attending Time: I, the attending, spent greater than 30 minutes in counseling pt, coordinating discharge care, placing discharge orders and complete discharge summary.    Signed:  Saloni Lablanc E Makhiya Coburn, MD  12/17/2023      cc:  Primary Care Physician:  Kyung Goodell   Verified    Referring physicians:  No ref. provider found   Additional provider(s):        Did we miss something? If additional records are needed, please fax a request on office letterhead to [phone]. Please include the patient's name, date of birth, fax number and type of information needed. Additional request can be made by email at [email or web address]. For general questions of information about electronic records sharing, call [phone].

## 2023-12-17 NOTE — Case Management (ED)
 Case Management Progress Note    NAME:Paul Benjamin                          MRN: [id number]              DOB:[birth year]          AGE: 78 y.o.  ADMISSION DATE: 12/13/2023             DAYS ADMITTED: LOS: 4 days      Today's Date: 12/17/2023    PLAN: to dc home with resumption of HH [facility]) and family support     Expected Discharge Date: 12/17/2023   Is Patient Medically Stable: Yes   Are there Barriers to Discharge? no    INTERVENTION/DISPOSITION:  Discharge Planning              Discharge Planning: Home Health   Pt was on service with [facility]  [phone] fax [phone] PTA. Patient stated he was on service with skilled nursing. NCM called [facility] to confirm but had to leave VM.    NCM submitted ROC orders for provider signature. NCM faxed signed orders and AVS to [facility].    Family to provide transportation.   No further CM needs identified.   Transportation              Does the Patient Need Case Management to Arrange Discharge Transport? (ex: facility, ambulance, wheelchair/stretcher, Medicaid, cab, other): No  Will the Patient Use Family Transport?: Yes  Transportation Name, Phone and Availability #1: Wife  Support              Support: Huddle/team update   NCM reviewed EMR and attended MPY huddle.    Per [facility] provider, patient is medically stable for dc today.   Info or Referral                 Positive SDOH Domains and Potential Barriers                   Medication Needs              Medication Needs: No Needs Identified    Financial              Financial: No Needs Identified  Legal              Legal: No Needs Identified  Other              Other/None: No needs identified  Discharge Disposition               Selected Continued Care - Admitted Since 12/13/2023       [facility] Coordination complete.      Service Provider Services Address Phone Fax Patient Preferred    [facility] CARE AND [facility] - [facility] [address] [place] [address] [phone] --                  Rhoda Jasmine, BSN, RN   Nurse Case Manager   [phone]  Available on Voalte

## 2023-12-18 ENCOUNTER — Encounter: Admit: 2023-12-18 | Discharge: 2023-12-18 | Payer: MEDICARE

## 2023-12-18 LAB — CULTURE-URINE W/SENSITIVITY
~~LOC~~ BKR URINE CULTURE: 10 — AB
~~LOC~~ BKR URINE CULTURE: 10 — AB

## 2023-12-20 ENCOUNTER — Encounter: Admit: 2023-12-20 | Discharge: 2023-12-20 | Payer: MEDICARE

## 2023-12-20 ENCOUNTER — Ambulatory Visit: Admit: 2023-12-20 | Discharge: 2023-12-21 | Payer: MEDICARE

## 2024-01-07 ENCOUNTER — Encounter: Admit: 2024-01-07 | Discharge: 2024-01-07 | Payer: MEDICARE

## 2024-01-08 ENCOUNTER — Encounter: Admit: 2024-01-08 | Discharge: 2024-01-08 | Payer: MEDICARE

## 2024-01-08 ENCOUNTER — Ambulatory Visit: Admit: 2024-01-08 | Discharge: 2024-01-09 | Payer: MEDICARE

## 2024-01-08 VITALS — BP 120/57 | HR 74 | Ht 69.0 in | Wt 216.7 lb

## 2024-01-08 DIAGNOSIS — R339 Retention of urine, unspecified: Secondary | ICD-10-CM

## 2024-01-08 DIAGNOSIS — N184 Chronic kidney disease, stage 4 (severe): Principal | ICD-10-CM

## 2024-01-08 DIAGNOSIS — E8770 Fluid overload, unspecified: Secondary | ICD-10-CM

## 2024-01-08 NOTE — Patient Instructions
After the removal of one kidney, it is expected that you may lose up to a third of your kidney function.  Having a solitary kidney puts you at risk of worsening kidney disease.  Certain lifestyle changes may help to slow down the progression of kidney disease.  These include:  - Healthy low-salt diet  -Maintaining a BMI less than 30 kg/m2  -Adequate hydration  -Blood pressure or hypertension control  -Controlling amount of protein in your urine  -Avoidance of over-the-counter pain medications including Advil, Motrin, Aleve, ibuprofen, diclofenac, naproxen, piroxicam.

## 2024-01-08 NOTE — Progress Notes
 I saw the patient in the company of his wife  History        CC: Evaluation management of CKD        HPI: Paul Benjamin is a 78 y.o. 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  He used to follow-up with Dr.Qasim and was last seen in March 2023.  Interval history 06-20-22: He had acute bronchitis and required a 10 day course of antibiotics which he just completed. He has SoB on exertion. He denies dizziness or lightheadedness.    Interval history 12-21-2022: No interval health events. He denies difficulty urinating/ changes in his urine. He experiences some shortness of breath with ambulation.  His wife states that the dose of furosemide  was reduced and he is on a much lower does than is listed. He does not take NSAIDs.  According to his wife, the salt is his problem; he has to salt everything .    Interval history :He was admitted on 02-01-23 due to worsening renal function.  Renal ultrasound with bilateral mild hydronephrosis .Mildly distended urinary bladder with diffuse bladder   wall trabeculation/diverticula and layering debris. Obstructive uropathy  due to prostatic enlargement. He was discharged with a retained urethral catheter.  He left rehab on 02-17-2023.  He feels tired and has poor appetite. He is back on semaglutide  . He has been more sleepy since he got home.    Interval history 04-17-2023: He was admitted from 03/18/23 to 03/26/2023 on account of CAD which required pci of the RCA . Admission complicated by urinary tract infection. Furosemide  was held at discharge.  He was discharged to a skilled nursing facility. He is back home now. His breathing is stable.    Interval  history 10-15-2023: No major events since his last appointment. They are planning to move in with their daughter in [place].    Interval history 01-08-2024:    He was recently hospitalized due to cellulitis and he received fluids, which improved his condition. He monitors his fluid intake and weight daily to manage his condition.  He has a history of a heart attack and is on blood thinners, which has delayed the placement of a suprapubic catheter. He currently has a urethral catheter that is changed monthly at [facility], with the next change scheduled for February 05, 2024.  He takes furosemide  for swelling, particularly when his legs are swollen, and metoprolol  for heart-related issues. He also takes Protonix  twice daily for acid reflux, although he does not frequently experience reflux symptoms. He occasionally uses Tylenol  for shoulder pain and other aches.  He and his wife have recently moved in with their daughter in [place], reducing their travel time to the hospital from over an hour to about half an hour. No frequent reflux symptoms and does not take antacids regularly. He does not use NSAIDs .         Past Medical History   Past Medical History:    Abnormal involuntary movement    Arthritis    Atrial fibrillation (CMS-HCC)    CAD (coronary artery disease)    Cataract    CHF (congestive heart failure) (CMS-HCC)    Chronic kidney disease    Colon polyps    Coronary artery disease (CAD)    Enlarged prostate    Erectile dysfunction    Gout    Heart attack (CMS-HCC)    HX: anticoagulation    Hypercholesterolemia  Hypertension    Hypertriglyceridemia    Incontinence    Infection    Kidney disease    Myocardial infarction (CMS-HCC)    Obesity    Sleep apnea    SSS (sick sinus syndrome) (CMS-HCC)    Type II diabetes mellitus (CMS-HCC)    Unspecified deficiency anemia    Urinary tract infection        Family History  Family History   Problem Relation Name Age of Onset    Asthma Father Fairy     Cardiovascular Father Fairy     Hypertension Mother Mervyn     High Cholesterol Mother Lucille     Heart Attack Brother      Kidney Disease Sister          Social History  Social History     Socioeconomic History    Marital status: Married   Occupational History     Employer: [employer] Comment: retired   Tobacco Use    Smoking status: Former     Current packs/day: 0.00     Average packs/day: 1 pack/day for 57.0 years (57.0 ttl pk-yrs)     Types: Cigarettes     Start date: 03/26/1962     Quit date: 03/26/1966     Years since quitting: 57.8     Passive exposure: Never    Smokeless tobacco: Never   Vaping Use    Vaping status: Never Used   Substance and Sexual Activity    Alcohol use: Never     Comment: rarely    Drug use: Never    Sexual activity: Never        Medication    HOME MEDS   allopurinoL  (ZYLOPRIM ) 100 mg tablet Take one tablet by mouth daily. Take with food.  Indications: treatment to prevent acute gout attack    aspirin  81 mg chewable tablet Chew one tablet by mouth daily. Indications: treatment to prevent a heart attack    atorvastatin  (LIPITOR) 40 mg tablet Take one tablet by mouth at bedtime daily. Indications: hardening of the arteries due to plaque buildup    CHOLEcalciferoL  (vitamin D3) (OPTIMAL D3) 50,000 units capsule Take one capsule by mouth every 7 days.    clotrimazole  (LOTRIMIN ) 1 % topical cream Apply  topically to affected area twice daily. Indications: a skin infection due to the fungus Candida    furosemide  (LASIX ) 20 mg tablet Take one tablet by mouth twice daily.    gabapentin  (NEURONTIN ) 100 mg capsule Take one capsule by mouth at bedtime daily.    ketoconazole  (NIZORAL ) 2 % topical cream Apply  topically to affected area twice daily. Indications: a skin infection due to the fungus Candida    levothyroxine  (SYNTHROID ) 75 mcg tablet Take one tablet by mouth daily.    metoprolol  succinate XL (TOPROL  XL) 25 mg extended release tablet Take one tablet by mouth daily.    mupirocin  (BACTROBAN ) 2 % topical ointment Apply  topically to affected area twice daily. Indications: minor skin infection due to bacteria    nitroglycerin  (NITROSTAT ) 0.4 mg tablet Place 1 tablet under tongue every 5 minutes as needed for Chest Pain.    pantoprazole  DR (PROTONIX ) 40 mg tablet Take 1 tablet by mouth twice daily    pramipexole  (MIRAPEX ) 1.5 mg tablet Take one tablet by mouth daily.    prasugreL (EFFIENT) 10 mg tablet Take one tablet by mouth daily.    semaglutide  (OZEMPIC ) 1 mg/dose (4 mg/3 mL) injection PEN Inject 1 mg under the  skin every 7 days.    traMADoL  (ULTRAM ) 50 mg tablet Take one tablet by mouth every 8 hours as needed for Pain.      Tresiba  20 units daily    Review of Systems  Constitutional: negative  Eyes: negative  Ears, nose, mouth, throat, and face: negative  Respiratory: negative  Cardiovascular: negative  Gastrointestinal: negative  Genitourinary:negative  Integument/breast: negative  Hematologic/lymphatic: negative  Musculoskeletal:negative  Neurological: negative  Endocrine: negative      Physical Exam        Vitals:    01/08/24 1106   BP: 120/57  Comment: 120/57   BP Source: Arm, Left Upper   Pulse: 74  Comment: 71-74   SpO2: 100%  Comment: 100   PainSc: Zero   Weight: 98.3 kg (216 lb 11.2 oz)  Comment: 216.7LB   Height: 175.3 cm (5' 9)           BP Readings from Last 5 Encounters:   01/08/24 120/57   12/20/23 106/61   12/17/23 132/70   11/28/23 115/68   11/27/23 (P) 134/73          Wt Readings from Last 5 Encounters:   01/08/24 98.3 kg (216 lb 11.2 oz)   12/20/23 95.8 kg (211 lb 1.6 oz)   12/17/23 90.8 kg (200 lb 2.8 oz)   11/28/23 93.7 kg (206 lb 9.6 oz)   11/27/23 98 kg (216 lb)        Body mass index is 32 kg/m?.       Gen: Alert and Oriented, No Acute Distress   HEENT: Sclera normal; MMM  CV:  S1 and S2 normal, no rubs, murmurs or gallops   Pulm: Clear to Auscultation bilateral   GI: BS+ x4, non-tender to palpation  Neuro: Grossly normal, moving all extremities, speech intact  Ext: He has bilateral lower extremity edema.  Skin: no rash     Assessment and Plan        BOSS DANIELSEN is a 78 y.o. 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  He has diabetes mellitus which is well controlled.  Underlying  heart failure may contribute to this as well. Acute kidney injury during previous admission was due to obstructive uropathy.  We discussed the goal of slowing the progression of the disease to avoid the need for dialysis. Discussed the potential future need for dialysis if kidney function worsens. He expressed a desire to avoid dialysis but acknowledged it may be necessary to prolong life.  - Monitor kidney function regularly  - Discuss potential switch from Protonix  to Pepcid to reduce risk of acute kidney injury.   - Avoid NSAIDs such as Advil, Motrin, Aleve, and naproxen    --Thrombocytopenia: Continue to monitor. Reassess with next set of labs. If worsening, will refer to hematology.    --Lower urinary tract obstruction: He saw urology. He is not a good surgical candidate.  He opted to undergo placement of a suprapubic catheter.    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  He is on  darbepoetin 60mcg every 2 weeks. Hemoglobin>11g/dl.   Darbepoetin on hold.  -We will reach out to the infusion center to decrease frequency of hemoglobin assessment to  monthly.      --Volume overload:  This is stable take furosemide  40mg   2 times daily  prn      -ADPKD: The use of tolvaptan has not been studied in  this age group and at this eGFR.  He underwent MRA of the brain in May 2023 without evidence of cerebral aneurysm.    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 The catheter is changed monthly at [facility].  - Continue monthly catheter changes at [facility]. Next change due on November 12th.            [place]wood DELENA Dunker, MD      There are no Patient Instructions on file for this visit.

## 2024-01-09 ENCOUNTER — Encounter: Admit: 2024-01-09 | Discharge: 2024-01-09 | Payer: MEDICARE

## 2024-01-09 ENCOUNTER — Ambulatory Visit: Admit: 2024-01-09 | Discharge: 2024-01-10 | Payer: MEDICARE

## 2024-01-10 ENCOUNTER — Encounter: Admit: 2024-01-10 | Discharge: 2024-01-10 | Payer: MEDICARE

## 2024-01-10 MED ORDER — FUROSEMIDE 20 MG PO TAB
20 mg | ORAL_TABLET | Freq: Two times a day (BID) | ORAL | 3 refills | 90.00000 days | Status: AC
Start: 2024-01-10 — End: ?

## 2024-01-13 ENCOUNTER — Encounter: Admit: 2024-01-13 | Discharge: 2024-01-13 | Payer: MEDICARE

## 2024-01-14 ENCOUNTER — Encounter: Admit: 2024-01-14 | Discharge: 2024-01-14 | Payer: MEDICARE

## 2024-01-15 ENCOUNTER — Encounter: Admit: 2024-01-15 | Discharge: 2024-01-15 | Payer: MEDICARE

## 2024-01-15 MED ORDER — FUROSEMIDE 20 MG PO TAB
60 mg | ORAL_TABLET | Freq: Two times a day (BID) | ORAL | 1 refills | 90.00000 days | Status: AC
Start: 2024-01-15 — End: ?

## 2024-01-20 ENCOUNTER — Encounter: Admit: 2024-01-20 | Discharge: 2024-01-20 | Payer: MEDICARE

## 2024-01-22 ENCOUNTER — Encounter: Admit: 2024-01-22 | Discharge: 2024-01-22 | Payer: MEDICARE

## 2024-01-30 ENCOUNTER — Encounter: Admit: 2024-01-30 | Discharge: 2024-01-30 | Payer: MEDICARE

## 2024-01-31 ENCOUNTER — Encounter: Admit: 2024-01-31 | Discharge: 2024-01-31 | Payer: MEDICARE

## 2024-02-03 ENCOUNTER — Encounter: Admit: 2024-02-03 | Discharge: 2024-02-03 | Payer: MEDICARE

## 2024-02-03 ENCOUNTER — Ambulatory Visit: Admit: 2024-02-03 | Discharge: 2024-02-04 | Payer: MEDICARE

## 2024-02-03 NOTE — Telephone Encounter [36]
 Called patient to see if HHC had been set up to for catheter exchanges.  Paul Benjamin states that he was discharged from his [facility] last week that was providing other care. [place] called who our referral was faxed to 10/16.  They requested that we re fax request.  They states that they just discharged this patient, verified that we are only requesting HHC for Urologic needs only.  They will review and get HHC set up.  Paul Benjamin called back and updated.  Plan to leave clinic visits as scheduled, until patient gets verification that they will be coming out to his home to exchange catheter.  Next exchange due 11/19.

## 2024-02-04 ENCOUNTER — Ambulatory Visit: Admit: 2024-02-04 | Discharge: 2024-02-05 | Payer: MEDICARE

## 2024-02-04 ENCOUNTER — Encounter: Admit: 2024-02-04 | Discharge: 2024-02-04 | Payer: MEDICARE

## 2024-02-06 ENCOUNTER — Encounter: Admit: 2024-02-06 | Discharge: 2024-02-06 | Payer: MEDICARE

## 2024-02-06 DIAGNOSIS — N184 Chronic kidney disease, stage 4 (severe): Principal | ICD-10-CM

## 2024-02-06 NOTE — Telephone Encounter [36]
 [facility] are denying patient for [facility] for foley catheter management at this time due to needing office visit note within last 30 days that include a full body assessment.  Patient has in clinic appointment 11/19 for foley exchange and can send referral again with that office visit note to fax [phone]

## 2024-02-10 ENCOUNTER — Encounter: Admit: 2024-02-10 | Discharge: 2024-02-10 | Payer: MEDICARE

## 2024-02-12 ENCOUNTER — Encounter: Admit: 2024-02-12 | Discharge: 2024-02-12 | Payer: MEDICARE

## 2024-02-12 ENCOUNTER — Ambulatory Visit: Admit: 2024-02-12 | Discharge: 2024-02-12 | Payer: MEDICARE

## 2024-02-12 ENCOUNTER — Ambulatory Visit: Admit: 2024-02-12 | Discharge: 2024-02-13 | Payer: MEDICARE

## 2024-02-12 VITALS — BP 119/73 | HR 97 | Temp 98.20000°F | Ht 69.0 in | Wt 220.0 lb

## 2024-02-12 DIAGNOSIS — Z978 Presence of other specified devices: Secondary | ICD-10-CM

## 2024-02-12 DIAGNOSIS — R339 Retention of urine, unspecified: Secondary | ICD-10-CM

## 2024-02-12 NOTE — Progress Notes [1]
 Date of Service: 02/12/2024    Subjective:             Paul Benjamin is a 78 y.o. male.    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  Creatinine currently around 2.8 with stable electrolytes  He is followed by Nephrology    He has a cardiac history and underwent placement of drug-eluting stents on 03/18/2023 which has precluded him from having surgery in the interim since then  We have discussed possible conversion to a suprapubic tube and they are potentially interested in that option, but will need to be evaluated by his cardiologist prior to surgery to determine if he would be a surgical candidate  He would also ideally need to be off anticoagulation for that procedure  Currently on Prasugrel  and low-dose aspirin  therapy  Also on Ozempic  - which would need to be managed by Anesthesia in terms of NPO status prior to surgery    He is scheduled to be seen in Cardiology on 12/4 for evaluation for this status    He returns for clinical assessment to help renew his home health status for ongoing catheter changes and care and also for catheter exchange today        Past Medical History:    Abnormal involuntary movement    Allergy    Arthritis    Atrial fibrillation (CMS-HCC)    CAD (coronary artery disease)    Cataract    CHF (congestive heart failure) (CMS-HCC)    Chronic kidney disease    Colon polyps    Coronary artery disease (CAD)    Enlarged prostate    Erectile dysfunction    Gout    Heart attack (CMS-HCC)    HX: anticoagulation    Hypercholesterolemia    Hypertension    Hypertriglyceridemia    Incontinence    Infection    Kidney disease    Myocardial infarction (CMS-HCC)    Obesity    Sleep apnea    SSS (sick sinus syndrome) (CMS-HCC)    Type II diabetes mellitus (CMS-HCC)    Unspecified deficiency anemia    Urinary tract infection     Surgical History:   Procedure Laterality Date    PACEMAKER PLACEMENT  09/30/2006    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    Performed by Paul Armin BROCKS, MD at [facility] EP LAB    Removal Permanent Pacemaker N/A 01/17/2016    Performed by Paul Armin BROCKS, MD at [facility] EP LAB    ESOPHAGOGASTRODUODENOSCOPY WITH BIOPSY - FLEXIBLE N/A 03/25/2023    Performed by Paul Dana, MD at [facility] ENDO    [facility]      CARDIAC CATHERIZATION  03/19/2023    COLONOSCOPY  3 or 4 years ago    CORONARY ANGIOPLASTY  03/18/2023    CORONARY STENT PLACEMENT      HX APPENDECTOMY  early 8's    HX EYE SURGERY  several years ago    catheracts    HX HEART CATHETERIZATION  March 18 2023     Social History     Socioeconomic History    Marital status: Married   Occupational History     Employer: [employer]     Comment: retired   Tobacco Use    Smoking status: Former     Current packs/day: 0.00     Average packs/day: 1 pack/day for 57.0 years (57.0 ttl pk-yrs)     Types: Cigarettes     Start date: 03/26/1962     Quit date: 03/26/1966     Years since quitting: 57.9     Passive exposure: Never    Smokeless tobacco: Never   Vaping Use    Vaping status: Never Used   Substance and Sexual Activity    Alcohol use: Never     Comment: rarely    Drug use: Never    Sexual activity: Not Currently     Partners: Female     Birth control/protection: None     Family History   Problem Relation Name Age of Onset    Asthma Father Fairy     Cardiovascular Father Fairy     Hypertension Mother Lucille     High Cholesterol Mother Lucille     Heart Attack Brother          2006    Kidney Disease Sister       Allergies[1]               Objective:        Objective    allopurinoL  (ZYLOPRIM ) 100 mg tablet Take one tablet by mouth daily. Take with food.  Indications: treatment to prevent acute gout attack    aspirin  81 mg chewable tablet Chew one tablet by mouth daily. Indications: treatment to prevent a heart attack    atorvastatin  (LIPITOR) 40 mg tablet Take one tablet by mouth at bedtime daily. Indications: hardening of the arteries due to plaque buildup    CHOLEcalciferoL  (vitamin D3) (OPTIMAL D3) 50,000 units capsule Take one capsule by mouth every 7 days.    clotrimazole  (LOTRIMIN ) 1 % topical cream Apply  topically to affected area twice daily. Indications: a skin infection due to the fungus Candida (Patient not taking: Reported on 02/12/2024)    ferrous sulfate (FEOSOL) 325 mg (65 mg iron ) tablet Take one tablet by mouth twice daily.    furosemide  (LASIX ) 20 mg tablet Take three tablets by mouth twice daily.    gabapentin  (NEURONTIN ) 100 mg capsule Take one capsule by mouth at bedtime daily.    ketoconazole  (NIZORAL ) 2 % topical cream Apply  topically to affected area twice daily. Indications: a skin infection due to the fungus Candida (Patient not taking: Reported on 02/12/2024)    levothyroxine  (SYNTHROID ) 75 mcg tablet Take one tablet by mouth daily.    metoprolol  succinate XL (TOPROL  XL) 25 mg extended release tablet Take one tablet by mouth daily.    mupirocin  (BACTROBAN ) 2 % topical ointment Apply  topically to affected area twice daily. Indications: minor skin infection due to bacteria (Patient not taking: Reported on 02/12/2024)    nitroglycerin  (NITROSTAT ) 0.4 mg tablet Place 1 tablet under tongue every 5 minutes as needed for Chest Pain.    nystatin (NYSTOP) 100,000 unit/g topical powder Apply one g topically to affected area as Needed (rash/groin area).    pramipexole  (MIRAPEX ) 1.5 mg tablet Take one tablet by mouth daily.    prasugreL (EFFIENT) 10 mg tablet Take one tablet by mouth daily.    semaglutide  (OZEMPIC ) 1 mg/dose (4 mg/3 mL) injection PEN Inject 1 mg under the skin every 7 days.    traMADoL  (ULTRAM ) 50 mg tablet Take one tablet by mouth every  8 hours as needed for Pain.     Vitals:    02/12/24 1511   BP: (P) 119/73   BP Source: Arm, Right Upper   Pulse: (P) 97   Temp: (P) 36.8 ?C (98.2 ?F)   SpO2: (P) 98%   TempSrc: Temporal   PainSc: Zero   Weight: (P) 99.8 kg (220 lb)   Height: (P) 175.3 cm (5' 9)     Body mass index is 32.49 kg/m? (pended).   Physical Exam  Vitals reviewed.   Constitutional:       General: He is not in acute distress.     Appearance: Normal appearance. He is well-developed. He is not ill-appearing.   HENT:      Head: Normocephalic and atraumatic.   Eyes:      Extraocular Movements: Extraocular movements intact.   Pulmonary:      Effort: Pulmonary effort is normal. No respiratory distress.   Genitourinary:     Comments: Foley catheter draining well with yellow urine  Small amount of urethra erosion at tip of meatus from catheter  Musculoskeletal:      Cervical back: Normal range of motion.      Comments: Decreased mobility, uses wheelchair for distances   Skin:     General: Skin is warm and dry.   Neurological:      Mental Status: He is alert and oriented to person, place, and time. Mental status is at baseline.   Psychiatric:         Mood and Affect: Mood normal.         Behavior: Behavior normal.         Thought Content: Thought content normal.         Judgment: Judgment normal.               Assessment and Plan:  Mr. Odonoghue presents to clinic for followup as outlined  He is currently going to keep the Foley catheter and this was changed today without difficulty  He will continue to get this changed monthly  Our team is working on securing Home Health services for assistance with that  His catheter should be changed at least monthly    He may be a candidate for conversion to a Suprapubic Tube in the near future pending his upcoming Cardiology assessment  They were asked to let us  know once they have been seen by that team, then we can schedule if they would like    Risks and benefits of suprapubic tube versus Foley cathter were discussed today and questions answered    His Foley catheter was changed out today as outlined  I supervised the patient care provided today for his catheter exchange    History and examination reviewed and key elements confirmed.  Discussed with the patient in detail and questions answered.    I personally saw and evaluated the patient and determined the plan of care.    Leona Pressly L. Ronold, MD, MPH    Total visit time > 45 minutes including review of records, history, exam, counseling, coordination of care, and documentation.  Coding also based on visit complexity.                               [1]   Allergies  Allergen Reactions    Ciprofloxacin RASH    Lisinopril SEE COMMENTS     hyperkalemia    Ceftriaxone  RASH  Rash with rocephin  11/2023    Cefuroxime HIVES     Tolerated augmentin  Aug 2024 and zosyn  in Nov 2024    Clindamycin RASH    Plavix [Clopidogrel] RASH    Sulfa (Sulfonamide Antibiotics) HIVES    Quinine SEE COMMENTS     Allergy recorded in SMS: QUININE

## 2024-02-12 NOTE — Progress Notes [1]
 Patient presented to clinic for foley catheter change. Patient's old 93 Fr COUDE catheter was removed without difficulty. Patient was then prepped before sterile insertion of new 16 Fr COUDE catheter. This was placed without difficulty, and irrigated with 50 cc's of sterile water , yellow colored urine return, to ensure proper placement. 10 cc's of sterile water  was inserted within balloon to secure catheter in place. Patient tolerated this well and left clinic in stable condition. Patient was sent home with large leg bags and leg straps.      Patient prefers large leg bags and leg straps.     Patient is ambulatory.      *Dr. Florance pt.*

## 2024-02-13 DIAGNOSIS — N184 Chronic kidney disease, stage 4 (severe): Secondary | ICD-10-CM

## 2024-02-13 DIAGNOSIS — N319 Neuromuscular dysfunction of bladder, unspecified: Principal | ICD-10-CM

## 2024-02-16 ENCOUNTER — Encounter: Admit: 2024-02-16 | Discharge: 2024-02-16 | Payer: MEDICARE

## 2024-02-25 ENCOUNTER — Encounter: Admit: 2024-02-25 | Discharge: 2024-02-25 | Payer: MEDICARE

## 2024-02-25 NOTE — Progress Notes [1]
 Cardiovascular Medicine     Date of Service: 02/27/2024    HPI     Paul Benjamin presents to the office today for cardiovascular follow up.  He is a 78 y.o. male with a past medical history of coronary artery disease with prior PCI to the RCA in December 2024 in the setting of a NSTEMI, paroxysmal atrial fibrillation, sick sinus syndrome status post permanent pacemaker, chronic diastolic heart failure, obesity, dyslipidemia, type 2 diabetes, chronic kidney disease, and obstructive sleep apnea.  He follows with Dr. Quin and was last seen in April 2025.    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    He comes the office today with his wife and is doing pretty well from a cardiovascular standpoint.  He was hospitalized in September 2025 at [facility] with renal issues and AKI.  He is on following closely with nephrology who recently referred him to vascular surgery in preparation for a fistula placement with progressive kidney disease.  He has a Foley catheter in place.  He uses a walker with ambulation with no recent falls.  He denies chest pain or dyspnea.  He has a fair appetite at home and his weight has been stable.       Vitals:    02/27/24 1512   BP: 129/74   BP Source: Arm, Left Upper   Pulse: 90   O2 Device: None (Room air)   PainSc: Zero   Weight: 99.8 kg (220 lb)   Height: 175.3 cm (5' 9)     Body mass index is 32.49 kg/m?SABRA     Problems Addressed Today  Encounter Diagnoses   Name Primary?    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  Coronary artery disease status post PCI in December 2024 in the setting of a non-STEMI.  He has nearly completed 1 year of dual antiplatelet therapy with aspirin  and prasugrel , I will discontinue his prasugrel  at the end of December and he will remain on aspirin  lifelong.  This will also help decrease the risk of bleeding in preparation for his fistula placement.  He is due for his annual lipid panel and I have placed an order today.  His LDL 1 year ago was 38.  He is on atorvastatin  40 mg daily.    2.  Hypertension.  His blood pressure is well-controlled, no changes made.    3.  History of sick sinus syndrome and paroxysmal atrial fibrillation status post permanent pacemaker.  His recent remote device check showed no events and 2.33 years of battery life left.  He has not been maintained on anticoagulation for a few years.  He is at a higher risk of bleeding with his fall risk and use of a walker, as well as anemia with chronic kidney disease.    4.  Type 2 diabetes.  This is managed by endocrinology.  His hemoglobin A1c was 6.2.  He is on Ozempic  injections.    5.  Chronic kidney disease stage IV.  His recent creatinine was 2.80 with a GFR of 22.  As above, they are consulting vascular surgery in preparation for hemodialysis fistula.    I have asked that he follow-up with Dr. Quin in 6 months.    Nat FORBES Gash, APRN-NP  Collaborating physicians: Dr. Redell Heymann and Dr. Bernardino Conger  Cardiovascular Medicine at [facility]  Phone: [phone]    Total Time Today was 40 minutes in the following activities: Preparing to see the patient, Obtaining and/or reviewing separately obtained history, Performing a medically appropriate examination and/or evaluation, Counseling and educating the patient/family/caregiver, Ordering medications, tests, or procedures, and Documenting clinical information in the electronic or other health record    This note was partially dictated using Dragon Medical One speech recognition software.  Occasional wrong-word or sound-alike substitutions may have occurred due to the inherent limitations of voice-recognition software.  Please read the chart carefully and recognize, using context, where the substitutions may have occurred.  Please do not hesitate to contact me for clarification.       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     His daughter drew out an extensive pedigree detailing FXTAS, Fragile X syndrome over four generations, beginning with Larry's grand parents. This will be scanned into O2      A-fib (CMS-HCC) 09/30/2006     Brief episode of atrial fibrillation after the stenting.         a.  Recurrent bouts of atrial fibrillation but completely asymptomatic.  3/09:  Hospital admission for sotalol initiation and cardioversion  09/23/07 A fib ablation:  Pulmonary vein antral isolation, ligament of marshall ablation, posterior roof line, cavotricuspid isthmus ablation  6/09 event monitor:  No recurrent AF      CAD (coronary artery disease) 09/30/2006     01/21/04 Heart catheterization - Angioplasty and stenting of the anterior descending coronary artery on 01/21/04 with a Cypher drug-eluting stent used for the LAD. The right coronary and circumflex showed only minimal disease. The ejection fraction was 45-50%.   07/18/2004:  Thallium showing EF of 53% with minimal amount of ischemia involving the               inferolateral segment.  3/09 adenosine thallium stress:  This study suggests a low likelihood for ischemic/jeopardized myocardium. No definite fixed or reversible defects are evident. High risk scintigraphic features are absent. Although the calculated LV ejection fraction was reduced, by visual estimation, the EF would appear at the lower limits of normal. The left ventricle is within normal limits for size.     7/11 thallium:  SUMMARY/OPINION: This study is probably normal. There is a small defect involving the inferior wall. The majority of the defect is fixed while more distally there is a subtle degree of reversibility. Overall I think this probably represents diaphragmatic attenuation though a very slight degree of ischemia cannot be completely excluded. The left ventricular systolic function is normal with a calculated ejection fraction of 61 percent. The end diastolic volume is normal at 100 mL. The pulmonary to myocardial count ratio is normal at 0.36. There is no transient ischemic dilatation.   This study was compared to a myocardial perfusion study obtained in our office on May 26, 2007. The prior study demonstrated an EF of 43 percent though visually it was felt to be in more of the 50-55 percent range. The prior study also demonstrated a perfusion abnormality involving the inferior wall which had some mild reversibility but overall was felt to be due to diaphragmatic attenuation.   In comparing the two studies qualitatively, both demonstrated an inferior wall defect. The prior study had more reversibility involving the basal to mid section while the current study has some mild reversibility involving the distal section. Overall I think both studies probably represent diaphragmatic attenuation.     05/03/2015 - Stress Test:    This study is probably normal with inferior attenuation with a small sized mild intensity reversible defect in the basal inferior wall consistent with  diaphragmatic attenuation. Left ventricular systolic function is normal. There are no high risk prognostic indicators present.  The ECG portion of the study is negative for ischemia.    02/2023 - NSTEMI with RCA stent          Hypertension 09/30/2006    Mixed dyslipidemia 09/30/2006    OSA (obstructive sleep apnea) 09/30/2006    SSS (sick sinus syndrome) (CMS-HCC) 09/30/2006     09/30/06 Dual Chamber PPM Initial ImplantMedtronic generator model #ADDR01 Medtronic atrial lead model #4076, Medtronic RV lead model #4076,     >>OVERVIEW FOR TACHY-BRADY SYNDROME (CMS-HCC) WRITTEN ON 09/29/2008  4:21 PM BY PIMENTEL, RHEA C, MD    8/08 s/p dual chamber PPM implant.  Implanted device is a MDT Adapta [id number]           ROS    Physical Exam:  General Appearance: no acute distress  Skin: warm & intact  Neck Veins: neck veins are flat & not distended  Carotid Arteries: no  bruits  Chest Inspection: chest is normal in appearance  Auscultation/Percussion: lungs clear to auscultation, no rales, rhonchi, or wheezing  Cardiac Rhythm: regular rhythm & normal rate  Cardiac Auscultation: Normal S1 & S2.    Murmurs: murmur absent  Extremities: no lower extremity edema; 2+ symmetric distal pulses  Neurologic Exam: oriented to time, place and person; no focal neurologic deficits  Psychiatric: Normal mood and affect.  Behavior is normal. Judgment and thought content normal.      Cardiovascular Health Factors  Vitals BP Readings from Last 3 Encounters:   02/27/24 129/74   02/12/24 (P) 119/73   02/12/24 130/72     Wt Readings from Last 3 Encounters:   02/27/24 99.8 kg (220 lb)   02/12/24 (P) 99.8 kg (220 lb)   02/12/24 97.9 kg (215 lb 14.4 oz)     BMI Readings from Last 3 Encounters:   02/27/24 32.49 kg/m?   02/12/24 (P) 32.49 kg/m?   02/12/24 31.88 kg/m?      Smoking Tobacco Use History[1]   Lipid Profile Cholesterol   Date Value Ref Range Status   03/19/2023 76 <200 mg/dL Final     HDL   Date Value Ref Range Status   03/19/2023 26 (L) >40 mg/dL Final     LDL   Date Value Ref Range Status   03/19/2023 38 <100 mg/dL Final     Triglycerides   Date Value Ref Range Status   03/19/2023 123 <150 mg/dL Final      Blood Sugar Hemoglobin A1C   Date Value Ref Range Status   03/19/2023 5.8 (H) 4.0 - 5.7 % Final     Comment:     The ADA recommends that most patients with type 1 and type 2 diabetes maintain an A1c level <7%.     Glucose   Date Value Ref Range Status   02/10/2024 116 (H) 70 - 100 mg/dL Final   90/76/7974 99 70 - 100 mg/dL Final   90/77/7974 898 (H) 70 - 100 mg/dL Final     Glucose, POC   Date Value Ref Range Status   02/03/2024 132 (A) 70 - 100 Final   12/13/2023 139 (H) 70 - 100 mg/dL Final   94/91/7974 875 (A) 70 - 100 Final                 Current Medications (including today's revisions)   allopurinoL  (ZYLOPRIM ) 100 mg tablet Take one tablet by mouth daily. Take with food.  Indications: treatment to prevent acute gout attack    aspirin  81 mg chewable tablet Chew one tablet by mouth daily. Indications: treatment to prevent a heart attack    atorvastatin  (LIPITOR) 40 mg tablet Take one tablet by mouth at bedtime daily. Indications: hardening of the arteries due to plaque buildup    CHOLEcalciferoL  (vitamin D3) (OPTIMAL D3) 50,000 units capsule Take one capsule by mouth every 7 days.    clotrimazole  (LOTRIMIN ) 1 % topical cream Apply  topically to affected area twice daily. Indications: a skin infection due to the fungus Candida (Patient not taking: Reported on 02/27/2024)    ferrous sulfate (FEOSOL) 325 mg (65 mg iron ) tablet Take one tablet by mouth twice daily.    furosemide  (LASIX ) 20 mg tablet Take three tablets by mouth twice daily. (Patient taking differently: Take six tablets by mouth daily.)    gabapentin  (NEURONTIN ) 100 mg capsule Take one capsule by mouth at bedtime daily.    ketoconazole  (NIZORAL ) 2 % topical cream Apply  topically to affected area twice daily. Indications: a skin infection due to the fungus Candida (Patient not taking: Reported on 02/27/2024)    levothyroxine  (SYNTHROID ) 75 mcg tablet Take one tablet by mouth daily.    metoprolol  succinate XL (TOPROL  XL) 25 mg extended release tablet Take one tablet by mouth daily.    mupirocin  (BACTROBAN ) 2 % topical ointment Apply  topically to affected area twice daily. Indications: minor skin infection due to bacteria (Patient not taking: Reported on 02/27/2024)    nitroglycerin  (NITROSTAT ) 0.4 mg tablet Place 1 tablet under tongue every 5 minutes as needed for Chest Pain.    nystatin (NYSTOP) 100,000 unit/g topical powder Apply one g topically to affected area as Needed (rash/groin area).    pramipexole  (MIRAPEX ) 1.5 mg tablet Take one tablet by mouth daily.    semaglutide  (OZEMPIC ) 1 mg/dose (4 mg/3 mL) injection PEN Inject 1 mg under the skin every 7 days.    traMADoL  (ULTRAM ) 50 mg tablet Take one tablet by mouth every 8 hours as needed for Pain.                 [1]   Social History  Tobacco Use   Smoking Status Former    Current packs/day: 0.00    Average packs/day: 1 pack/day for 57.0 years (57.0 ttl pk-yrs)    Types: Cigarettes    Start date: 03/26/1962    Quit date: 03/26/1966    Years since quitting: 57.9    Passive exposure: Never   Smokeless Tobacco Never

## 2024-02-27 ENCOUNTER — Encounter: Admit: 2024-02-27 | Discharge: 2024-02-27 | Payer: MEDICARE

## 2024-02-27 ENCOUNTER — Ambulatory Visit: Admit: 2024-02-27 | Discharge: 2024-02-28 | Payer: MEDICARE

## 2024-02-27 VITALS — BP 129/74 | HR 90 | Ht 69.0 in | Wt 220.0 lb

## 2024-02-27 DIAGNOSIS — I10 Essential (primary) hypertension: Secondary | ICD-10-CM

## 2024-02-27 DIAGNOSIS — I2510 Atherosclerotic heart disease of native coronary artery without angina pectoris: Secondary | ICD-10-CM

## 2024-02-27 DIAGNOSIS — Z950 Presence of cardiac pacemaker: Secondary | ICD-10-CM

## 2024-02-27 DIAGNOSIS — I480 Paroxysmal atrial fibrillation: Secondary | ICD-10-CM

## 2024-02-27 DIAGNOSIS — E1122 Type 2 diabetes mellitus with diabetic chronic kidney disease: Secondary | ICD-10-CM

## 2024-02-28 DIAGNOSIS — E1169 Type 2 diabetes mellitus with other specified complication: Principal | ICD-10-CM

## 2024-02-28 DIAGNOSIS — E785 Hyperlipidemia, unspecified: Secondary | ICD-10-CM

## 2024-03-03 ENCOUNTER — Encounter: Admit: 2024-03-03 | Discharge: 2024-03-03 | Payer: MEDICARE

## 2024-03-03 NOTE — Progress Notes [1]
 Patient was scheduled for a Medtronic Carelink on 04/18/23 that has not been received. Patient was instructed to send a manual transmission. Instructed if the transmitter does not appear to be working properly, they need to contact the device company directly. Patient was provided with that contact number. Requested the patient send [place] a MyChart message or contact our device nurses at [phone] to let us know after they have sent their transmission. Mychart sent to pt. BC      Note: Patient needs to send a manual transmission as we did not receive their scheduled remote interrogation.

## 2024-03-06 ENCOUNTER — Encounter: Admit: 2024-03-06 | Discharge: 2024-03-06 | Payer: MEDICARE

## 2024-03-07 ENCOUNTER — Encounter: Admit: 2024-03-07 | Discharge: 2024-03-07 | Payer: MEDICARE

## 2024-03-11 ENCOUNTER — Encounter: Admit: 2024-03-11 | Discharge: 2024-03-11 | Payer: MEDICARE

## 2024-03-12 ENCOUNTER — Encounter: Admit: 2024-03-12 | Discharge: 2024-03-12 | Payer: MEDICARE

## 2024-03-24 ENCOUNTER — Encounter: Admit: 2024-03-24 | Discharge: 2024-03-24 | Payer: MEDICARE

## 2024-03-24 ENCOUNTER — Emergency Department: Admit: 2024-03-24 | Discharge: 2024-03-24 | Disposition: A | Discharge status: Home / Self Care | Payer: MEDICARE

## 2024-03-24 ENCOUNTER — Emergency Department: Admit: 2024-03-24 | Discharge: 2024-03-24 | Payer: MEDICARE

## 2024-03-26 ENCOUNTER — Encounter: Admit: 2024-03-26 | Discharge: 2024-03-26 | Payer: MEDICARE

## 2024-03-30 ENCOUNTER — Encounter: Admit: 2024-03-30 | Discharge: 2024-03-30 | Payer: MEDICARE

## 2024-03-30 DIAGNOSIS — N319 Neuromuscular dysfunction of bladder, unspecified: Principal | ICD-10-CM

## 2024-03-30 NOTE — Telephone Encounter [36]
 Answered questions on upcoming Cystoscopy.

## 2024-03-31 ENCOUNTER — Encounter: Admit: 2024-03-31 | Discharge: 2024-03-31 | Payer: MEDICARE

## 2024-04-01 ENCOUNTER — Encounter: Admit: 2024-04-01 | Discharge: 2024-04-01 | Payer: MEDICARE

## 2024-04-01 NOTE — Progress Notes [1]
 Application for patient's Trulicity has been submitted to prescriber for signatures.    Paul Benjamin  Medication Assistance Coordinator  [phone]

## 2024-04-02 ENCOUNTER — Encounter: Admit: 2024-04-02 | Discharge: 2024-04-02 | Payer: MEDICARE

## 2024-04-03 ENCOUNTER — Encounter: Admit: 2024-04-03 | Discharge: 2024-04-03 | Payer: MEDICARE

## 2024-04-03 NOTE — Telephone Encounter [36]
 MAP received for new Trulicity prescription  Placed in A [facility] folder for review and signature

## 2024-04-03 NOTE — Progress Notes [1]
 An application has been submitted to [employer] for Trulicity .      Cristino Seip  Medication Assistance Coordinator  [phone]

## 2024-04-03 NOTE — Telephone Encounter [36]
 Faxed signed paperwork back to MAP with confirmation of successful fax received  Original placed to scan

## 2024-04-06 ENCOUNTER — Encounter: Admit: 2024-04-06 | Discharge: 2024-04-06 | Payer: MEDICARE

## 2024-04-08 ENCOUNTER — Encounter: Admit: 2024-04-08 | Discharge: 2024-04-08 | Payer: MEDICARE

## 2024-04-15 ENCOUNTER — Encounter: Admit: 2024-04-15 | Discharge: 2024-04-15 | Payer: MEDICARE

## 2024-04-15 NOTE — Progress Notes [1]
 MEDICATION ASSISTANCE PROGRAM (MAP)  MANUFACTURER SUPPLIED MEDICATION    Drug: Hydrologist Program: [employer]  Status: Approved  Enrollment Period: 04/06/2024-03/25/2025  MAP or Drug Company to [employer]: Drug company  Refill Info: Pt to call [phone] for refills    Notes: Patient eligibility is based off insurance status and/or dx and patient must continue to meet all criteria to remain in the program. Please notify the MAP Program of any changes.     Cristino Seip  Medication Assistance Coordinator  [phone]

## 2024-04-16 ENCOUNTER — Encounter: Admit: 2024-04-16 | Discharge: 2024-04-16 | Payer: MEDICARE

## 2024-04-17 ENCOUNTER — Encounter: Admit: 2024-04-17 | Discharge: 2024-04-17 | Payer: MEDICARE

## 2024-04-17 DIAGNOSIS — N184 Chronic kidney disease, stage 4 (severe): Principal | ICD-10-CM

## 2024-04-17 MED ORDER — FUROSEMIDE 20 MG PO TAB
60 mg | ORAL_TABLET | Freq: Two times a day (BID) | ORAL | 3 refills | 90.00000 days | Status: AC
Start: 2024-04-17 — End: ?

## 2024-04-17 NOTE — Telephone Encounter [36]
Fax received from [employer]  Printed LOV for attachment  Filled out and placed in A [facility] folder for review and signature

## 2024-04-17 NOTE — Telephone Encounter [36]
Faxed signed DME form to [employer] [facility]  Confirmation of original fax received  Original placed to scan

## 2024-04-22 ENCOUNTER — Encounter: Admit: 2024-04-22 | Discharge: 2024-04-22 | Payer: MEDICARE

## 2024-04-27 ENCOUNTER — Encounter: Admit: 2024-04-27 | Discharge: 2024-04-27 | Payer: MEDICARE

## 2024-04-27 ENCOUNTER — Ambulatory Visit: Admit: 2024-04-27 | Discharge: 2024-04-27 | Payer: MEDICARE

## 2024-04-27 DIAGNOSIS — E875 Hyperkalemia: Secondary | ICD-10-CM

## 2024-04-27 DIAGNOSIS — N184 Chronic kidney disease, stage 4 (severe): Principal | ICD-10-CM

## 2024-04-27 DIAGNOSIS — E8770 Fluid overload, unspecified: Secondary | ICD-10-CM

## 2024-04-27 MED ORDER — BUMETANIDE 2 MG PO TAB
3 mg | ORAL_TABLET | Freq: Two times a day (BID) | ORAL | 3 refills | 30.00000 days | Status: AC
Start: 2024-04-27 — End: ?

## 2024-04-27 NOTE — Patient Instructions [37]
 The following are some measures which may help to slow down the progression of kidney disease:      Maintain adequate fluid intake.    Acute illnesses like the flu and COVID which may end up in hospitalization may impact on  kidney function and could cause the kidneys to fail.    It is important to keep up with  regular vaccinations for these diseases including the pneumonia vaccine as well.    If you develop diarrhea or vomiting and  are unable to keep up with your fluid intake, please report to the nearest ED since it may affect blood pressure and affect the blood flow to the kidney.    Avoid medications known as  NSAIDs which  include Advil, Motrin,Aleve, ibuprofen, naproxen.    Some medications used in patients with kidney disease including diuretics [water pill], some blood pressure medications [ACE inhibitors and ARB] like lisinopril, losartan may need to be held if you develop severe diarrhea or vomiting.  Under these conditions these medications may worsen kidney function and therefore need to be held until diarhea/vomiting resolves. If you have to hold your blood pressure or water pill medication for any of these reasons, please inform the provider who prescribed the medication.    Some medications used for stomach acid suppression  known as Proton Pump Inhibitors (which include pantoprazole, omeprazole, lansoprazole, nexium, protonix, prilosec) have been associated with kidney disease through unknown mechanisms. Unless you have a compelling indication for any of these medications , consider switching to a different class of medications known as histamine-2 receptor blockers eg. famotidine,  etc (your pharmacist or PCP can recommend a brand).    Nutrition management is a critical component in slowing kidney disease progression.  Visit the following web site for information on diet in patients with kidney disease:  https://www.kidney.org/nutrition    Visit the following website for health education videos on kidney disease:  [email or web address].kidney.[email or web address]

## 2024-04-29 ENCOUNTER — Encounter: Admit: 2024-04-29 | Discharge: 2024-04-29 | Payer: MEDICARE

## 2024-04-30 ENCOUNTER — Encounter: Admit: 2024-04-30 | Discharge: 2024-04-30 | Payer: MEDICARE

## 2024-04-30 ENCOUNTER — Ambulatory Visit: Admit: 2024-04-30 | Discharge: 2024-04-30 | Payer: MEDICARE

## 2024-04-30 MED ORDER — BUMETANIDE 2 MG PO TAB
3 mg | ORAL_TABLET | Freq: Two times a day (BID) | ORAL | 3 refills | 37.50000 days | Status: AC
Start: 2024-04-30 — End: ?
